# Patient Record
Sex: Female | Born: 1935
Health system: Southern US, Community
[De-identification: ages and names within clinical notes are randomized; demographics above are authoritative.]

## PROBLEM LIST (undated history)

## (undated) DIAGNOSIS — I739 Peripheral vascular disease, unspecified: Secondary | ICD-10-CM

## (undated) DIAGNOSIS — M1990 Unspecified osteoarthritis, unspecified site: Secondary | ICD-10-CM

## (undated) DIAGNOSIS — I4891 Unspecified atrial fibrillation: Secondary | ICD-10-CM

## (undated) DIAGNOSIS — I2510 Atherosclerotic heart disease of native coronary artery without angina pectoris: Secondary | ICD-10-CM

## (undated) DIAGNOSIS — R7302 Impaired glucose tolerance (oral): Secondary | ICD-10-CM

## (undated) DIAGNOSIS — Z87442 Personal history of urinary calculi: Secondary | ICD-10-CM

## (undated) DIAGNOSIS — I10 Essential (primary) hypertension: Secondary | ICD-10-CM

## (undated) DIAGNOSIS — I639 Cerebral infarction, unspecified: Secondary | ICD-10-CM

## (undated) DIAGNOSIS — I779 Disorder of arteries and arterioles, unspecified: Secondary | ICD-10-CM

## (undated) DIAGNOSIS — F32A Depression, unspecified: Secondary | ICD-10-CM

## (undated) DIAGNOSIS — E785 Hyperlipidemia, unspecified: Secondary | ICD-10-CM

## (undated) DIAGNOSIS — I495 Sick sinus syndrome: Secondary | ICD-10-CM

## (undated) DIAGNOSIS — J189 Pneumonia, unspecified organism: Secondary | ICD-10-CM

## (undated) DIAGNOSIS — F329 Major depressive disorder, single episode, unspecified: Secondary | ICD-10-CM

## (undated) DIAGNOSIS — Z7901 Long term (current) use of anticoagulants: Secondary | ICD-10-CM

## (undated) HISTORY — PX: COLONOSCOPY: SHX174

## (undated) HISTORY — DX: Pneumonia, unspecified organism: J18.9

## (undated) HISTORY — PX: APPENDECTOMY: SHX54

## (undated) HISTORY — DX: Sick sinus syndrome: I49.5

## (undated) HISTORY — DX: Long term (current) use of anticoagulants: Z79.01

## (undated) HISTORY — PX: BREAST BIOPSY: SHX20

## (undated) HISTORY — PX: CATARACT EXTRACTION W/ INTRAOCULAR LENS  IMPLANT, BILATERAL: SHX1307

## (undated) HISTORY — PX: ABDOMINAL HYSTERECTOMY: SHX81

## (undated) HISTORY — DX: Cerebral infarction, unspecified: I63.9

## (undated) HISTORY — DX: Hyperlipidemia, unspecified: E78.5

---

## 1997-01-27 HISTORY — PX: CAROTID ENDARTERECTOMY: SUR193

## 2001-04-13 ENCOUNTER — Emergency Department (HOSPITAL_COMMUNITY): Admission: EM | Admit: 2001-04-13 | Discharge: 2001-04-14 | Payer: Self-pay | Admitting: Emergency Medicine

## 2001-04-13 ENCOUNTER — Encounter: Payer: Self-pay | Admitting: Emergency Medicine

## 2002-01-12 ENCOUNTER — Encounter: Payer: Self-pay | Admitting: Family Medicine

## 2002-01-12 ENCOUNTER — Ambulatory Visit (HOSPITAL_COMMUNITY): Admission: RE | Admit: 2002-01-12 | Discharge: 2002-01-12 | Payer: Self-pay | Admitting: Family Medicine

## 2003-09-08 ENCOUNTER — Inpatient Hospital Stay (HOSPITAL_COMMUNITY): Admission: EM | Admit: 2003-09-08 | Discharge: 2003-09-09 | Payer: Self-pay | Admitting: *Deleted

## 2003-09-08 ENCOUNTER — Encounter: Payer: Self-pay | Admitting: Emergency Medicine

## 2003-10-14 ENCOUNTER — Emergency Department (HOSPITAL_COMMUNITY): Admission: EM | Admit: 2003-10-14 | Discharge: 2003-10-14 | Payer: Self-pay | Admitting: Emergency Medicine

## 2003-10-30 ENCOUNTER — Ambulatory Visit (HOSPITAL_COMMUNITY): Admission: RE | Admit: 2003-10-30 | Discharge: 2003-10-30 | Payer: Self-pay | Admitting: Family Medicine

## 2003-11-07 ENCOUNTER — Emergency Department (HOSPITAL_COMMUNITY): Admission: EM | Admit: 2003-11-07 | Discharge: 2003-11-08 | Payer: Self-pay | Admitting: *Deleted

## 2003-12-07 ENCOUNTER — Ambulatory Visit: Payer: Self-pay | Admitting: Family Medicine

## 2003-12-18 ENCOUNTER — Ambulatory Visit: Payer: Self-pay | Admitting: *Deleted

## 2004-01-09 ENCOUNTER — Ambulatory Visit: Payer: Self-pay | Admitting: Cardiology

## 2004-01-28 HISTORY — PX: INSERT / REPLACE / REMOVE PACEMAKER: SUR710

## 2004-02-05 ENCOUNTER — Emergency Department (HOSPITAL_COMMUNITY): Admission: EM | Admit: 2004-02-05 | Discharge: 2004-02-05 | Payer: Self-pay | Admitting: Emergency Medicine

## 2004-02-09 ENCOUNTER — Ambulatory Visit: Payer: Self-pay | Admitting: *Deleted

## 2004-02-16 ENCOUNTER — Ambulatory Visit: Payer: Self-pay | Admitting: Cardiology

## 2004-02-25 ENCOUNTER — Emergency Department (HOSPITAL_COMMUNITY): Admission: EM | Admit: 2004-02-25 | Discharge: 2004-02-25 | Payer: Self-pay | Admitting: Emergency Medicine

## 2004-03-01 ENCOUNTER — Ambulatory Visit: Payer: Self-pay | Admitting: Internal Medicine

## 2004-04-01 ENCOUNTER — Ambulatory Visit: Payer: Self-pay | Admitting: *Deleted

## 2004-04-01 ENCOUNTER — Ambulatory Visit: Payer: Self-pay | Admitting: Family Medicine

## 2004-04-30 ENCOUNTER — Ambulatory Visit: Payer: Self-pay | Admitting: *Deleted

## 2004-05-30 ENCOUNTER — Ambulatory Visit: Payer: Self-pay | Admitting: Cardiology

## 2004-06-06 ENCOUNTER — Ambulatory Visit (HOSPITAL_COMMUNITY): Admission: RE | Admit: 2004-06-06 | Discharge: 2004-06-06 | Payer: Self-pay | Admitting: Cardiology

## 2004-06-06 ENCOUNTER — Ambulatory Visit: Payer: Self-pay | Admitting: Cardiology

## 2004-06-07 ENCOUNTER — Ambulatory Visit: Payer: Self-pay | Admitting: Cardiology

## 2004-06-20 ENCOUNTER — Ambulatory Visit (HOSPITAL_COMMUNITY): Admission: RE | Admit: 2004-06-20 | Discharge: 2004-06-21 | Payer: Self-pay | Admitting: Internal Medicine

## 2004-06-21 ENCOUNTER — Ambulatory Visit: Payer: Self-pay | Admitting: Internal Medicine

## 2004-06-27 ENCOUNTER — Ambulatory Visit: Payer: Self-pay | Admitting: Cardiology

## 2004-07-18 ENCOUNTER — Ambulatory Visit: Payer: Self-pay | Admitting: Cardiology

## 2004-07-18 ENCOUNTER — Ambulatory Visit (HOSPITAL_COMMUNITY): Admission: RE | Admit: 2004-07-18 | Discharge: 2004-07-18 | Payer: Self-pay | Admitting: Cardiology

## 2004-09-18 ENCOUNTER — Ambulatory Visit (HOSPITAL_COMMUNITY): Admission: RE | Admit: 2004-09-18 | Discharge: 2004-09-18 | Payer: Self-pay | Admitting: Internal Medicine

## 2004-09-18 ENCOUNTER — Ambulatory Visit: Payer: Self-pay | Admitting: Internal Medicine

## 2004-11-21 ENCOUNTER — Ambulatory Visit: Payer: Self-pay | Admitting: Cardiology

## 2004-12-05 ENCOUNTER — Ambulatory Visit: Payer: Self-pay | Admitting: Cardiology

## 2004-12-23 ENCOUNTER — Ambulatory Visit (HOSPITAL_COMMUNITY): Admission: RE | Admit: 2004-12-23 | Discharge: 2004-12-23 | Payer: Self-pay | Admitting: Family Medicine

## 2005-05-26 ENCOUNTER — Ambulatory Visit: Payer: Self-pay | Admitting: Cardiology

## 2005-05-28 ENCOUNTER — Ambulatory Visit: Payer: Self-pay | Admitting: Internal Medicine

## 2005-05-30 ENCOUNTER — Ambulatory Visit: Payer: Self-pay | Admitting: Internal Medicine

## 2005-10-14 ENCOUNTER — Emergency Department (HOSPITAL_COMMUNITY): Admission: EM | Admit: 2005-10-14 | Discharge: 2005-10-14 | Payer: Self-pay | Admitting: Emergency Medicine

## 2005-12-04 ENCOUNTER — Ambulatory Visit: Payer: Self-pay | Admitting: *Deleted

## 2005-12-30 ENCOUNTER — Ambulatory Visit: Payer: Self-pay | Admitting: Internal Medicine

## 2006-03-04 ENCOUNTER — Ambulatory Visit: Payer: Self-pay | Admitting: Internal Medicine

## 2006-04-30 LAB — CONVERTED CEMR LAB
HDL: 41 mg/dL
LDL (calc): 100 mg/dL
Triglyceride fasting, serum: 176 mg/dL

## 2006-06-03 ENCOUNTER — Ambulatory Visit: Payer: Self-pay | Admitting: Internal Medicine

## 2006-06-12 ENCOUNTER — Ambulatory Visit: Payer: Self-pay | Admitting: Cardiology

## 2006-09-17 ENCOUNTER — Ambulatory Visit: Payer: Self-pay | Admitting: Cardiology

## 2006-12-10 ENCOUNTER — Ambulatory Visit: Payer: Self-pay | Admitting: Cardiology

## 2007-06-17 ENCOUNTER — Ambulatory Visit: Payer: Self-pay | Admitting: Cardiovascular Disease

## 2007-09-13 ENCOUNTER — Ambulatory Visit: Payer: Self-pay | Admitting: Internal Medicine

## 2007-09-14 ENCOUNTER — Ambulatory Visit: Payer: Self-pay | Admitting: Internal Medicine

## 2007-12-15 ENCOUNTER — Ambulatory Visit: Payer: Self-pay | Admitting: Internal Medicine

## 2008-01-28 DIAGNOSIS — J189 Pneumonia, unspecified organism: Secondary | ICD-10-CM

## 2008-01-28 HISTORY — DX: Pneumonia, unspecified organism: J18.9

## 2008-03-13 ENCOUNTER — Ambulatory Visit: Payer: Self-pay | Admitting: Cardiology

## 2008-03-13 ENCOUNTER — Inpatient Hospital Stay (HOSPITAL_COMMUNITY): Admission: EM | Admit: 2008-03-13 | Discharge: 2008-03-22 | Payer: Self-pay | Admitting: Emergency Medicine

## 2008-03-15 ENCOUNTER — Ambulatory Visit: Payer: Self-pay | Admitting: Cardiology

## 2008-03-15 ENCOUNTER — Encounter (INDEPENDENT_AMBULATORY_CARE_PROVIDER_SITE_OTHER): Payer: Self-pay | Admitting: Family Medicine

## 2008-03-29 ENCOUNTER — Ambulatory Visit: Payer: Self-pay | Admitting: Internal Medicine

## 2008-05-12 ENCOUNTER — Encounter (INDEPENDENT_AMBULATORY_CARE_PROVIDER_SITE_OTHER): Payer: Self-pay

## 2008-06-14 ENCOUNTER — Ambulatory Visit: Payer: Self-pay | Admitting: Internal Medicine

## 2008-08-16 ENCOUNTER — Ambulatory Visit (HOSPITAL_COMMUNITY): Admission: RE | Admit: 2008-08-16 | Discharge: 2008-08-16 | Payer: Self-pay | Admitting: Family Medicine

## 2008-08-31 LAB — PROTIME-INR

## 2008-09-11 ENCOUNTER — Ambulatory Visit (HOSPITAL_COMMUNITY): Admission: RE | Admit: 2008-09-11 | Discharge: 2008-09-11 | Payer: Self-pay | Admitting: Ophthalmology

## 2008-09-13 ENCOUNTER — Ambulatory Visit: Payer: Self-pay | Admitting: Internal Medicine

## 2008-09-13 ENCOUNTER — Encounter: Payer: Self-pay | Admitting: Internal Medicine

## 2008-09-18 ENCOUNTER — Encounter: Payer: Self-pay | Admitting: Internal Medicine

## 2008-10-16 ENCOUNTER — Ambulatory Visit (HOSPITAL_COMMUNITY): Admission: RE | Admit: 2008-10-16 | Discharge: 2008-10-16 | Payer: Self-pay | Admitting: Ophthalmology

## 2008-12-12 ENCOUNTER — Ambulatory Visit (HOSPITAL_COMMUNITY): Admission: RE | Admit: 2008-12-12 | Discharge: 2008-12-12 | Payer: Self-pay | Admitting: Family Medicine

## 2008-12-13 ENCOUNTER — Ambulatory Visit: Payer: Self-pay | Admitting: Internal Medicine

## 2008-12-14 ENCOUNTER — Telehealth (INDEPENDENT_AMBULATORY_CARE_PROVIDER_SITE_OTHER): Payer: Self-pay | Admitting: *Deleted

## 2008-12-16 ENCOUNTER — Encounter: Payer: Self-pay | Admitting: Internal Medicine

## 2008-12-27 ENCOUNTER — Ambulatory Visit (HOSPITAL_COMMUNITY): Admission: RE | Admit: 2008-12-27 | Discharge: 2008-12-27 | Payer: Self-pay | Admitting: Family Medicine

## 2009-01-03 ENCOUNTER — Encounter: Payer: Self-pay | Admitting: Internal Medicine

## 2009-01-30 ENCOUNTER — Encounter: Payer: Self-pay | Admitting: Family Medicine

## 2009-03-21 ENCOUNTER — Ambulatory Visit: Payer: Self-pay | Admitting: Internal Medicine

## 2009-03-28 ENCOUNTER — Ambulatory Visit: Payer: Self-pay | Admitting: Vascular Surgery

## 2009-04-10 ENCOUNTER — Encounter: Payer: Self-pay | Admitting: Internal Medicine

## 2009-06-13 ENCOUNTER — Ambulatory Visit: Payer: Self-pay | Admitting: Internal Medicine

## 2009-06-13 DIAGNOSIS — Z950 Presence of cardiac pacemaker: Secondary | ICD-10-CM | POA: Insufficient documentation

## 2009-08-10 ENCOUNTER — Telehealth (INDEPENDENT_AMBULATORY_CARE_PROVIDER_SITE_OTHER): Payer: Self-pay

## 2009-09-13 ENCOUNTER — Ambulatory Visit: Payer: Self-pay | Admitting: Internal Medicine

## 2009-10-10 ENCOUNTER — Encounter: Payer: Self-pay | Admitting: Internal Medicine

## 2009-11-12 ENCOUNTER — Ambulatory Visit: Payer: Self-pay | Admitting: Cardiology

## 2009-11-13 ENCOUNTER — Encounter: Payer: Self-pay | Admitting: Adult Health

## 2009-12-13 ENCOUNTER — Ambulatory Visit: Payer: Self-pay | Admitting: Internal Medicine

## 2009-12-24 ENCOUNTER — Ambulatory Visit: Payer: Self-pay | Admitting: Internal Medicine

## 2009-12-24 ENCOUNTER — Encounter (INDEPENDENT_AMBULATORY_CARE_PROVIDER_SITE_OTHER): Payer: Self-pay | Admitting: *Deleted

## 2009-12-26 ENCOUNTER — Encounter: Payer: Self-pay | Admitting: Internal Medicine

## 2009-12-26 ENCOUNTER — Telehealth (INDEPENDENT_AMBULATORY_CARE_PROVIDER_SITE_OTHER): Payer: Self-pay | Admitting: *Deleted

## 2010-01-03 ENCOUNTER — Encounter: Payer: Self-pay | Admitting: Internal Medicine

## 2010-01-03 ENCOUNTER — Encounter (INDEPENDENT_AMBULATORY_CARE_PROVIDER_SITE_OTHER): Payer: Self-pay | Admitting: *Deleted

## 2010-01-03 LAB — CONVERTED CEMR LAB
BUN: 13 mg/dL
BUN: 13 mg/dL (ref 6–23)
Basophils Relative: 1 % (ref 0–1)
CO2: 31 meq/L
Calcium: 9.6 mg/dL
Chloride: 105 meq/L
Creatinine, Ser: 1 mg/dL (ref 0.40–1.20)
Eosinophils Absolute: 0.1 10*3/uL
Eosinophils Absolute: 0.1 10*3/uL (ref 0.0–0.7)
Eosinophils Relative: 2 %
Glucose, Bld: 90 mg/dL
HCT: 41.6 % (ref 36.0–46.0)
INR: 1.79 — ABNORMAL HIGH (ref <1.50)
Lymphs Abs: 3.1 10*3/uL
Lymphs Abs: 3.1 10*3/uL (ref 0.7–4.0)
MCHC: 33.2 g/dL (ref 30.0–36.0)
Monocytes Relative: 9 %
Neutro Abs: 3.7 10*3/uL (ref 1.7–7.7)
Neutrophils Relative %: 48 % (ref 43–77)
Platelets: 233 10*3/uL (ref 150–400)
Potassium: 3.9 meq/L (ref 3.5–5.3)
Prothrombin Time: 21 s — ABNORMAL HIGH (ref 11.6–15.2)
RBC: 4.3 M/uL
RBC: 4.3 M/uL (ref 3.87–5.11)
WBC: 7.7 10*3/uL
aPTT: 31 s

## 2010-01-10 ENCOUNTER — Ambulatory Visit (HOSPITAL_COMMUNITY)
Admission: RE | Admit: 2010-01-10 | Discharge: 2010-01-10 | Payer: Self-pay | Source: Home / Self Care | Attending: Internal Medicine | Admitting: Internal Medicine

## 2010-01-11 ENCOUNTER — Encounter: Payer: Self-pay | Admitting: Internal Medicine

## 2010-01-29 ENCOUNTER — Telehealth (INDEPENDENT_AMBULATORY_CARE_PROVIDER_SITE_OTHER): Payer: Self-pay

## 2010-02-08 ENCOUNTER — Encounter: Payer: Self-pay | Admitting: Internal Medicine

## 2010-02-08 ENCOUNTER — Ambulatory Visit
Admission: RE | Admit: 2010-02-08 | Discharge: 2010-02-08 | Payer: Self-pay | Source: Home / Self Care | Attending: Cardiology | Admitting: Cardiology

## 2010-02-15 ENCOUNTER — Encounter: Payer: Self-pay | Admitting: Internal Medicine

## 2010-02-19 ENCOUNTER — Encounter (INDEPENDENT_AMBULATORY_CARE_PROVIDER_SITE_OTHER): Payer: Self-pay | Admitting: *Deleted

## 2010-02-26 ENCOUNTER — Ambulatory Visit
Admission: RE | Admit: 2010-02-26 | Discharge: 2010-02-26 | Payer: Self-pay | Source: Home / Self Care | Attending: Cardiology | Admitting: Cardiology

## 2010-02-26 DIAGNOSIS — R7309 Other abnormal glucose: Secondary | ICD-10-CM | POA: Insufficient documentation

## 2010-02-26 DIAGNOSIS — I495 Sick sinus syndrome: Secondary | ICD-10-CM | POA: Insufficient documentation

## 2010-02-26 DIAGNOSIS — E785 Hyperlipidemia, unspecified: Secondary | ICD-10-CM | POA: Insufficient documentation

## 2010-02-26 NOTE — Letter (Signed)
Summary: Remote Device Check  [employer], Main Office  [address]   [place], [place] [address]   Phone: [phone]  Fax: [phone]     October 10, 2009 MRN: [id number]   AKYLAH HASCALL [address] [place], [place]  [address]   Dear Ms. Maynes,   Your remote transmission was recieved and reviewed by your physician.  All diagnostics were within normal limits for you.  __X___Your next transmission is scheduled for:  12-13-09.  Please transmit at any time this day.  If you have a wireless device your transmission will be sent automatically.    Sincerely,  Vella Kohler

## 2010-02-26 NOTE — Letter (Signed)
Summary: Implantable Device Instructions  [facility]  [address] [place] [address]   Phone: [phone]  Fax: [phone]      Implantable Device Instructions  You are scheduled for:  _____ Permanent Transvenous Pacemaker _____ Implantable Cardioverter Defibrillator _____ Implantable Loop Recorder __x__ Generator Change  on 01/10/2010 with Dr.Taylor.  1.  Please arrive at the Short Stay Center at [facility] at 5:30am on the day of your procedure.  2.  Do not eat or drink the night before your procedure.  3.  Complete lab work on 01/04/2010   Take your last dose of Coumadin on 01/07/2010.  5.  Plan for an overnight stay.  Bring your insurance cards and a list of your medications.  6.  Wash your chest and neck with antibacterial soap (any brand) the evening before and the morning of your procedure.  Rinse well.  7.  Education material received:     Pacemaker _____           ICD _____           Arrhythmia _____  *If you have ANY questions after you get home, please call the office [phone].  *Every attempt is made to prevent procedures from being rescheduled.  Due to the nauture of Electrophysiology, rescheduling can happen.  The physician is always aware and directs the staff when this occurs.

## 2010-02-26 NOTE — Assessment & Plan Note (Addendum)
Summary: ERI-requires generator replacement   Visit Type:  Follow-up Primary Provider:  [facility]   CC:  no cardiology complaints.  History of Present Illness: Traci [name] is a 76 y/o CF we are following for PAF, hypertension and symptomatic bradycardia who is s/p pacemaker.   She denies chest pain, or palpatations but has noted increased fatigue, and sob. She says she cannot tell when she is in atrial fib.  She feels weak as well. No peripheral edema.  Current Medications (verified): 1)  Coumadin 5 Mg Tabs (Warfarin Sodium) .... Take As Directed 2)  Verapamil Hcl 120 Mg Tabs (Verapamil Hcl) .... Take 1 Tablet By Mouth Three Times A Day 3)  Propranolol Hcl 10 Mg Tabs (Propranolol Hcl) .... Take 1 Tablet By Mouth Daily 4)  Alprazolam 1 Mg Tabs (Alprazolam) .... Take 1/4   Tab By Mouth At Bedtime 5)  Vitamin C 1000 Mg Tabs (Ascorbic Acid) .... Take 1 Tab Three Times A Day 6)  Vitamin E 400 Unit Caps (Vitamin E) .... Take 1 Tab Two Times A Day 7)  Lithium Carbonate 1200 .... Take 1 Tab Daily 8)  Calcium 1200 .... Take 1 Tab Three Times A Day 9)  Losartan Potassium 50 Mg Tabs (Losartan Potassium) .... Take 1 Tablet By Mouth Once A Day 10)  Crestor 5 Mg Tabs (Rosuvastatin Calcium) .... Take 1 Tab Daily 11)  Niacin 250 Mg Tabs (Niacin) .... Take 1 Tab Daily 12)  Fish Oil 1000 Mg Caps (Omega-3 Fatty Acids) .... Take 1 Tab Daily 13)  Folic Acid 400 Mcg Tabs (Folic Acid) .... Take 1 Tab Daily 14)  Potassium 99 Mg Tabs (Potassium) .... Take 1 Tab Daily  Allergies (verified): 1)  ! * Morphine  Past History:  Past Medical History: Last updated: 06/13/2008 Atrial Fibrillation Hyperlipidemia Hypertension DM II CAROTID STENOSIS CHRONIC ANTICOAGULATION  Review of Systems       The patient complains of dyspnea on exertion.  The patient denies chest pain, syncope, and peripheral edema.    Vital Signs:  Patient profile:   76 year old female Weight:      160 pounds BMI:     26.72 Pulse  rate:   62 / minute BP sitting:   155 / 89  (right arm)  Vitals Entered By: Dreama Saa, CNA (December 24, 2009 4:22 PM)  Physical Exam  General:  Well developed, well nourished, in no acute distress. Head:  normocephalic and atraumatic Eyes:  PERRLA/EOM intact; conjunctiva and lids normal. Neck:  Neck supple, no JVD. No masses, thyromegaly or abnormal cervical nodes. Lungs:  Clear bilaterally to auscultation with no wheezes, rales, or rhonchi.  Heart:  Irregular without MRG Abdomen:  Bowel sounds positive; abdomen soft and non-tender without masses, organomegaly, or hernias noted. No hepatosplenomegaly. Msk:  Back normal, normal gait. Muscle strength and tone normal. Pulses:  pulses normal in all 4 extremities Extremities:  No clubbing or cyanosis. Neurologic:  Alert and oriented x 3.   PPM Specifications Following MD:  Lewayne Bunting, MD     PPM Vendor:  [employer]     PPM Model Number:  [id number]     PPM Serial Number:  [id number] PPM DOI:  06/20/2004     PPM Implanting MD:  Lewayne Bunting, MD  Lead 1    Location: RA     DOI: 06/20/2004     Model #: 4034     Serial #: [id number]     Status: active Lead 2    Location:  RV     DOI: 06/20/2004     Model #: 2536     Serial #: [id number]     Status: active   Indications:  SSS   PPM Follow Up Pacer Dependent:  Yes      Episodes Coumadin:  Yes  Parameters Mode:  DDDR     Lower Rate Limit:  50     Upper Rate Limit:  130 Paced AV Delay:  300     Sensed AV Delay:  300 Tech Comments:  Device @ ERI.  A-fib, + coumadin. [name], LPN  December 24, 2009 4:41 PM  MD Comments:  Agree with above.   Impression & Recommendations:  Problem # 1:  CARDIAC PACEMAKER IN SITU (ICD-V45.01) Her device has reached ERI.  Will schedule generator change.  Problem # 2:  ATRIAL FIBRILLATION (ICD-427.31) Her rates are controlled but no optimally.  Will switch form inderal to carvedilol.  she may require digoxin. The following medications were  removed from the medication list:    Inderal La 80 Mg Xr24h-cap (Propranolol hcl) .Marland Kitchen... 1 tablet by mouth once daily Her updated medication list for this problem includes:    Coumadin 5 Mg Tabs (Warfarin sodium) .Marland Kitchen... Take as directed    Propranolol Hcl 10 Mg Tabs (Propranolol hcl) .Marland Kitchen... Take 1 tablet by mouth daily  Problem # 3:  CHRONIC DIASTOLIC HEART FAILURE (ICD-428.32) Her symptoms appear to be worse.  I have asked her to reduce her sodium intake and start low dose lasix.  The following medications were removed from the medication list:    Inderal La 80 Mg Xr24h-cap (Propranolol hcl) .Marland Kitchen... 1 tablet by mouth once daily Her updated medication list for this problem includes:    Coumadin 5 Mg Tabs (Warfarin sodium) .Marland Kitchen... Take as directed    Verapamil Hcl 120 Mg Tabs (Verapamil hcl) .Marland Kitchen... Take 1 tablet by mouth three times a day    Propranolol Hcl 10 Mg Tabs (Propranolol hcl) .Marland Kitchen... Take 1 tablet by mouth daily    Losartan Potassium 50 Mg Tabs (Losartan potassium) .Marland Kitchen... Take 1 tablet by mouth once a day

## 2010-02-26 NOTE — Progress Notes (Signed)
Summary: RX REFILL  Phone Note Call from Patient Call back at Home Phone [phone]   Caller: PT Reason for Call: Refill Medication Summary of Call: PT WOULD LIKE TO HAVE HER RX LOSARTIN FAXED TO [phone] [facility] SO THEY WILL HAVE A COPY OF IT WHEN  ITS TIME TO GET FILLED.  PT CALLING BACK TO GIVE THE SAME INFO SHE SAID SHE HAS JOINED [employer] FOR RX NOW SO GO AHEAD AND CALL THEM IN FOR HER.  I CALLED PT BACK AND LET HER KNOW SHE NEEDS TO MAKE APPT WITH DR. Dietrich Pates FIRST. PT STATES THAT THIS RX WAS GIVEN BY DR Ladona Ridgel.  SHE DOWSNT REMEMBER THE NAME OF IT SHE SAYS IT HAS SARTAN IN THE NAME. SHE CAN NOT MAKE APPT AT THIS TIME WITH [facility] AND WILL CALL BACK TO DO SO.   Initial call taken by: Faythe Ghee,  August 10, 2009 3:09 PM  Follow-up for Phone Call        [facility]. Pt. is 3 years overdue for f/u with Dr. Dietrich Pates Follow-up by: Larita Fife Via LPN,  August 10, 2009 4:18 PM    New/Updated Medications: LOSARTAN POTASSIUM 50 MG TABS (LOSARTAN POTASSIUM) Take 1 tablet by mouth once a day Prescriptions: LOSARTAN POTASSIUM 50 MG TABS (LOSARTAN POTASSIUM) Take 1 tablet by mouth once a day  #90 x 0   Entered by:   Larita Fife Via LPN   Authorized by:   Laren Boom, MD, [facility]   Signed by:   Larita Fife Via LPN on 29/56/2130   Method used:   Faxed to ...       [facility] (mail-order)             , [place]         Ph: [phone]       Fax: [phone]   RxID:   [id number]

## 2010-02-26 NOTE — Cardiovascular Report (Signed)
Summary: Office Visit Remote   Office Visit Remote   Imported By: Roderic Ovens 10/16/2009 14:03:41  _____________________________________________________________________  External Attachment:    Type:   Image     Comment:   External Document

## 2010-02-26 NOTE — Progress Notes (Signed)
Summary: NEEDS NEW RX [facility]XED TO [facility]  Phone Note Call from Patient Call back at [phone]   Caller: PT Reason for Call: Refill Medication Summary of Call: PT WAS JUST PUT ON NEW MEDS THIS WEEK AND NEED THEM CALLED IN TO [facility] FAX [phone]. SHE DOESNT KNOW THE NAME OF THE MEDICATIONS. Initial call taken by: Faythe Ghee,  December 26, 2009 9:33 AM    Prescriptions: FUROSEMIDE 20 MG TABS (FUROSEMIDE) Take one tablet by mouth daily.  #90 x 3   Entered by:   Teressa Lower RN   Authorized by:   Laren Boom, MD, [facility]   Signed by:   Teressa Lower RN on 12/26/2009   Method used:   Electronically to        [facility] (mail-order)       [address]       [place], [place]  [address]       Ph: [phone]       Fax: [phone]   RxID:   [id number] POTASSIUM CHLORIDE CR 10 MEQ CR-CAPS (POTASSIUM CHLORIDE) Take 1 tablet by mouth once a day  #90 x 3   Entered by:   Teressa Lower RN   Authorized by:   Laren Boom, MD, [facility]   Signed by:   Teressa Lower RN on 12/26/2009   Method used:   Electronically to        [facility] (mail-order)       [address]       [place], [place]  [address]       Ph: [phone]       Fax: [phone]   RxID:   [id number] CARVEDILOL 6.25 MG TABS (CARVEDILOL) Take one tablet by mouth twice a day  #180 x 3   Entered by:   Teressa Lower RN   Authorized by:   Laren Boom, MD, [facility]   Signed by:   Teressa Lower RN on 12/26/2009   Method used:   Electronically to        [facility] (mail-order)       [address]       [place], [place]  [address]       Ph: [phone]       Fax: [phone]   RxID:   [id number] POTASSIUM CHLORIDE CR 10 MEQ CR-CAPS (POTASSIUM CHLORIDE) Take 1 tablet by mouth once a day  #90 x 3   Entered by:   Teressa Lower RN   Authorized by:   Laren Boom, MD, [facility]   Signed by:   Teressa Lower RN on  12/26/2009   Method used:   Faxed to ...       [facility] ([facility]order)             , [place]         Ph:        Fax: [phone]   RxID:   [id number] FUROSEMIDE 20 MG TABS (FUROSEMIDE) Take one tablet by mouth daily.  #90 x 3   Entered by:   Teressa Lower RN   Authorized by:   Laren Boom, MD, [facility]   Signed by:   Teressa Lower RN on 12/26/2009   Method used:   Faxed to ...       [facility] (mail-order)             , [place]  Ph:        Fax: [phone]   RxID:   [id number] CARVEDILOL 6.25 MG TABS (CARVEDILOL) Take one tablet by mouth twice a day  #180 x 3   Entered by:   Teressa Lower RN   Authorized by:   Laren Boom, MD, [facility]   Signed by:   Teressa Lower RN on 12/26/2009   Method used:   Faxed to ...       [facility] (mail[facility]order)             , [place]         Ph:        Fax: [phone]   RxID:   [id number]

## 2010-02-26 NOTE — Cardiovascular Report (Signed)
Summary: Office Visit   Office Visit   Imported By: Roderic Ovens 06/29/2009 10:35:30  _____________________________________________________________________  External Attachment:    Type:   Image     Comment:   External Document

## 2010-02-26 NOTE — Cardiovascular Report (Signed)
Summary: Office Visit Remote   Office Visit Remote   Imported By: Roderic Ovens 01/04/2010 15:08:09  _____________________________________________________________________  External Attachment:    Type:   Image     Comment:   External Document

## 2010-02-26 NOTE — Assessment & Plan Note (Signed)
Summary: 1 yr f/u per checkout on 06/14/08/tg   Visit Type:  Follow-up Primary Provider:  DR.MARK CRESENZO  CC:  NO CARDIOLOGY COMPLAINTS TODAY.  History of Present Illness: Traci [name] returns today for PM followup.  She is a pleasant 76 yo woman with a h/o HTN, PAF and symptomatic bradycardia who underwent PPM in 2006.  She initially was bothered by palpitations but her symptoms have improved.  She denies c/p, sob or peripheral edema. She has been on Benicar but would like to change meds.  Her sister has had good luck with losartan.  She has had no syncope.  Current Medications (verified): 1)  Coumadin 5 Mg Tabs (Warfarin Sodium) 2)  Verapamil Hcl 120 Mg Tabs (Verapamil Hcl) .... Take 1 Tablet By Mouth Three Times A Day 3)  Benicar 40 Mg Tabs (Olmesartan Medoxomil) .... Take One Tablet By Mouth Daily 4)  Propranolol Hcl 10 Mg Tabs (Propranolol Hcl) .... Take 1 Tablet By Mouth Daily 5)  Alprazolam 1 Mg Tabs (Alprazolam) .... Take 1/2  Tab By Mouth At Bedtime 6)  Vitamin C Cr 500 Mg Cr-Tabs (Ascorbic Acid) .... Take 1 Tab Daily 7)  Vitamin E 100 Unit Caps (Vitamin E) .... Take 1 Tab Daily 8)  Lithium Carbonate 600 Mg Caps (Lithium Carbonate) .... Take 1 Tab Daily 9)  Calcium 500 Mg Tabs (Calcium) .... Take 1 Tab Daily 10)  Hair/skin/nails  Tabs (Multiple Vitamins-Minerals) .... Take 1 Tab Daily 11)  Losartan Potassium 50 Mg Tabs (Losartan Potassium) .... Take 1 Tablet By Mouth Once A Day  Allergies (verified): No Known Drug Allergies  Past History:  Past Medical History: Last updated: 06/13/2008 Atrial Fibrillation Hyperlipidemia Hypertension DM II CAROTID STENOSIS CHRONIC ANTICOAGULATION  Review of Systems  The patient denies chest pain, syncope, dyspnea on exertion, and peripheral edema.    Vital Signs:  Patient profile:   76 year old female Height:      65 inches Weight:      150 pounds BMI:     25.05 Pulse rate:   72 / minute BP sitting:   166 / 81  (right  arm)  Vitals Entered By: Traci Saa, CNA (Jun 13, 2009 4:38 PM)  Physical Exam  General:  Well developed, well nourished, in no acute distress.  HEENT: normal Neck: supple. No JVD. Carotids 2+ bilaterally no bruits Cor: RRR no rubs, gallops or murmur except for a soft S4. Lungs: CTA. Well healed PPM incision. Ab: soft, nontender. nondistended. No HSM. Good bowel sounds Ext: warm. no cyanosis, clubbing or edema Neuro: alert and oriented. Grossly nonfocal. affect pleasant    PPM Specifications Following MD:  Traci Bunting, MD     PPM Vendor:  [employer]     PPM Model Number:  [id number]     PPM Serial Number:  [id number] PPM DOI:  06/20/2004     PPM Implanting MD:  Traci Bunting, MD  Lead 1    Location: RA     DOI: 06/20/2004     Model #: [id number]     Serial #: [id number]     Status: active Lead 2    Location: [facility]     DOI: 06/20/2004     Model #: 3875     Serial #: [id number]     Status: active   Indications:  SSS   PPM Follow Up Remote Check?  No Battery Voltage:  2.95 V     Pacer Dependent:  Yes       PPM Device  Measurements [facility]  Amplitude: 2.3 mV, Impedance: 608 ohms, Threshold: 0.5 V at 0.4 msec Right Ventricle  Amplitude: 5.1 mV, Impedance: 456 ohms, Threshold: 1.0 V at 0.4 msec  Episodes MS Episodes:  263     Percent Mode Switch:  73.1%     Coumadin:  Yes Atrial Pacing:  22.5%     Ventricular Pacing:  4.7%  Parameters Mode:  DDDR     [name] Rate Limit:  50     Upper Rate Limit:  130 Paced AV Delay:  300     Sensed AV Delay:  300 Next Remote Date:  09/13/2009     Next Cardiology Appt Due:  05/28/2010 Tech Comments:  Reprogrammed as above.   [facility] transmissions every 3 months. ROV 1 year with Dr. Ladona Ridgel. Traci Harm, LPN  Jun 13, 2009 5:10 PM  MD Comments:  Agree with above. We reprogrammed her to minimize RV pacing.  Impression & Recommendations:  Problem # 1:  ATRIAL FIBRILLATION (ICD-427.31) Her ventricular rate has been well controlled.   Continue current  meds. Her updated medication list for this problem includes:    Coumadin 5 Mg Tabs (Warfarin sodium)    Propranolol Hcl 10 Mg Tabs (Propranolol hcl) .Marland Kitchen... Take 1 tablet by mouth daily  Problem # 2:  CARDIAC PACEMAKER IN SITU (ICD-V45.01) Her device is working normally.  Will recheck in several months.  Problem # 3:  ESSENTIAL HYPERTENSION, BENIGN (ICD-401.1) Her blood pressure remains elevated.  I have asked her to try Losartan 50 mg/day in place of Benicar as she wishes a generic drug. Her updated medication list for this problem includes:    Verapamil Hcl 120 Mg Tabs (Verapamil hcl) .Marland Kitchen... Take 1 tablet by mouth three times a day    Benicar 40 Mg Tabs (Olmesartan medoxomil) .Marland Kitchen... Take one tablet by mouth daily    Propranolol Hcl 10 Mg Tabs (Propranolol hcl) .Marland Kitchen... Take 1 tablet by mouth daily    Losartan Potassium 50 Mg Tabs (Losartan potassium) .Marland Kitchen... Take 1 tablet by mouth once a day  Patient Instructions: 1)  Your physician recommends that you schedule a follow-up appointment in: 1 year pacer follow up 2)  Your physician has recommended you make the following change in your medication:  losartan 50mg  daily Prescriptions: LOSARTAN POTASSIUM 50 MG TABS (LOSARTAN POTASSIUM) Take 1 tablet by mouth once a day  #30 x 6   Entered by:   Traci Lower RN   Authorized by:   Laren Boom, MD, [facility]   Signed by:   Traci Lower RN on 06/13/2009   Method used:   Electronically to        [facility]  [address] (retail)       [address]       [place], [place]  [address]       Ph: [phone]       Fax: [phone]   RxID:   [id number]

## 2010-02-26 NOTE — Letter (Signed)
Summary: Remote Device Check  [facility], Main Office  [address]   [place], [place] [address]   Phone: [phone]  Fax: [phone]     April 10, 2009 MRN: [id number]   Traci [name] [address] [place], [place]  [address]   Dear [name],   Your remote transmission was recieved and reviewed by your physician.  All diagnostics were within normal limits for you.    ___X___Your next office visit is scheduled for:  MAY 2011 WITH DR Ladona Ridgel. Please call our [place] office [phone] to schedule an appointment.    Sincerely,  Proofreader

## 2010-02-26 NOTE — Letter (Signed)
Summary: Historic Patient File  Historic Patient File   Imported By: Lind Guest 01/30/2009 08:20:07  _____________________________________________________________________  External Attachment:    Type:   Image     Comment:   External Document

## 2010-02-26 NOTE — Miscellaneous (Signed)
Summary: Orders Update  Clinical Lists Changes  Problems: Added new problem of UNSPECIFIED PRE-OPERATIVE EXAMINATION (ICD-V72.84) - Signed Added new problem of COUMADIN THERAPY (ICD-V58.61) - Signed Medications: Changed medication from PROPRANOLOL HCL 10 MG TABS (PROPRANOLOL HCL) Take 1 tablet by mouth daILY to CARVEDILOL 6.25 MG TABS (CARVEDILOL) Take one tablet by mouth twice a day - Signed Added new medication of FUROSEMIDE 20 MG TABS (FUROSEMIDE) Take one tablet by mouth daily. - Signed Added new medication of POTASSIUM CHLORIDE CR 10 MEQ CR-CAPS (POTASSIUM CHLORIDE) Take 1 tablet by mouth once a day - Signed Rx of CARVEDILOL 6.25 MG TABS (CARVEDILOL) Take one tablet by mouth twice a day;  #60 x 3;  Signed;  Entered by: Teressa Lower RN;  Authorized by: Laren Boom, MD, [facility];  Method used: Electronically to [facility] [address] [place], [place], [place]  [address], Ph: [phone], Fax: [phone] Rx of FUROSEMIDE 20 MG TABS (FUROSEMIDE) Take one tablet by mouth daily.;  #30 x 3;  Signed;  Entered by: Teressa Lower RN;  Authorized by: Laren Boom, MD, [facility];  Method used: Electronically to [facility] [address], [place], [place], [place]  [address], Ph: [phone], Fax: [phone] Rx of POTASSIUM CHLORIDE CR 10 MEQ CR-CAPS (POTASSIUM CHLORIDE) Take 1 tablet by mouth once a day;  #30 x 3;  Signed;  Entered by: Teressa Lower RN;  Authorized by: Laren Boom, MD, [facility];  Method used: Electronically to [facility] [address] [place], [place], [place]  [address], Ph: [phone], Fax: [phone] Orders: Added new Test order of T-Basic Metabolic Panel [phone]) - Signed Added new Test order of T-CBC w/Diff [phone]) - Signed Added new Test order of T-PTT ([phone]) - Signed Added new Test order of T-Protime, Auto ([phone]) - Signed    Prescriptions: POTASSIUM CHLORIDE CR 10 MEQ CR-CAPS (POTASSIUM  CHLORIDE) Take 1 tablet by mouth once a day  #30 x 3   Entered by:   Teressa Lower RN   Authorized by:   Laren Boom, MD, [facility]   Signed by:   Teressa Lower RN on 12/24/2009   Method used:   Electronically to        [facility]  [address] (retail)       [address]       [place], [place]  [address]       Ph: [phone]       Fax: [phone]   RxID:   [id number] FUROSEMIDE 20 MG TABS (FUROSEMIDE) Take one tablet by mouth daily.  #30 x 3   Entered by:   Teressa Lower RN   Authorized by:   Laren Boom, MD, [facility]   Signed by:   Teressa Lower RN on 12/24/2009   Method used:   Electronically to        [facility]  [address] (retail)       [address]       [place], [place]  [address]       Ph: [phone]       Fax: [phone]   RxID:   [id number] CARVEDILOL 6.25 MG TABS (CARVEDILOL) Take one tablet by mouth twice a day  #60 x 3   Entered by:   Teressa Lower RN   Authorized by:   Laren Boom, MD, [facility]  Signed by:   Teressa Lower RN on 12/24/2009   Method used:   Electronically to        [facility]  [address] (retail)       [address]       [place], [place]  [address]       Ph: [phone]       Fax: [phone]   RxID:   [id number]   Appended Document: Orders Update    Clinical Lists Changes  Medications: Removed medication of POTASSIUM 99 MG TABS (POTASSIUM) take 1 tab daily Rx of CARVEDILOL 6.25 MG TABS (CARVEDILOL) Take one tablet by mouth twice a day;  #60 x 3;  Signed;  Entered by: Teressa Lower RN;  Authorized by: Laren Boom, MD, [facility];  Method used: Electronically to [facility] [address] [phone]*, [address], [place], [place]  [address], Ph: [phone], Fax: [phone] Rx of FUROSEMIDE 20 MG TABS (FUROSEMIDE) Take one tablet by mouth daily.;  #30 x 3;  Signed;  Entered by: Teressa Lower RN;  Authorized by: Laren Boom, MD, [facility];  Method used:  Electronically to [facility] [address] [phone]*, [address], [place], [place]  [address], Ph: [phone], Fax: [phone] Rx of POTASSIUM CHLORIDE CR 10 MEQ CR-CAPS (POTASSIUM CHLORIDE) Take 1 tablet by mouth once a day;  #30 x 3;  Signed;  Entered by: Teressa Lower RN;  Authorized by: Laren Boom, MD, [facility];  Method used: Electronically to [facility] [address] [phone]*, [address], [place], [place]  [address], Ph: [phone], Fax: [phone]    Prescriptions: POTASSIUM CHLORIDE CR 10 MEQ CR-CAPS (POTASSIUM CHLORIDE) Take 1 tablet by mouth once a day  #30 x 3   Entered by:   Teressa Lower RN   Authorized by:   Laren Boom, MD, [facility]   Signed by:   Teressa Lower RN on 12/24/2009   Method used:   Electronically to        [facility]. [phone]* (retail)       [address] [place], [place]  [address]       Ph: [phone]       Fax: [phone]   RxID:   [id number] FUROSEMIDE 20 MG TABS (FUROSEMIDE) Take one tablet by mouth daily.  #30 x 3   Entered by:   Teressa Lower RN   Authorized by:   Laren Boom, MD, [facility]   Signed by:   Teressa Lower RN on 12/24/2009   Method used:   Electronically to        [facility]. [phone]* (retail)       [address] [place], [place]  [address]       Ph: [phone]       Fax: [phone]   RxID:   [id number] CARVEDILOL 6.25 MG TABS (CARVEDILOL) Take one tablet by mouth twice a day  #60 x 3   Entered by:   Teressa Lower RN   Authorized by:   Laren Boom, MD, [facility]   Signed by:   Teressa Lower RN on 12/24/2009   Method used:   Electronically to        [facility]. [phone]* (retail)       [address]       [place], [place]  [address]  Ph: [phone]       Fax: [phone]   RxID:   [id number]

## 2010-02-26 NOTE — Assessment & Plan Note (Signed)
Summary: PAST DUE FOR 1 YR F/U/TG   Primary Provider:  DR.MARK CRESENZO   History of Present Illness: Traci [name] is a 76 y/o CF we are following for PAF, hypertension and symptomatic bradycardia who is s/p pacemaker.  She is followed also by Dr. Ladona Ridgel and was last seen by him in May of 2011.  At that time, benicar was changed to losartan. She is doing well but states she is more tired lately. She says that her husband has been diagnosed with lung cancer and she is taking him back and forth to [facility] for radiation treatments daily.  She denies shortness of breath, chest pain, or palpatations.  She says she cannot tell when she is in atrial fib.  She said in the past that she could, but no longer feels this.  Preventive Screening-Counseling & Management  Alcohol-Tobacco     Smoking Status: never  Current Medications (verified): 1)  Coumadin 5 Mg Tabs (Warfarin Sodium) 2)  Verapamil Hcl 120 Mg Tabs (Verapamil Hcl) .... Take 1 Tablet By Mouth Three Times A Day 3)  Propranolol Hcl 10 Mg Tabs (Propranolol Hcl) .... Take 1 Tablet By Mouth Daily 4)  Alprazolam 1 Mg Tabs (Alprazolam) .... Take 1/2  Tab By Mouth At Bedtime 5)  Vitamin C Cr 500 Mg Cr-Tabs (Ascorbic Acid) .... Take 1 Tab Daily 6)  Vitamin E 100 Unit Caps (Vitamin E) .... Take 1 Tab Daily 7)  Lithium Carbonate 600 Mg Caps (Lithium Carbonate) .... Take 1 Tab Daily 8)  Calcium 500 Mg Tabs (Calcium) .... Take 1 Tab Daily 9)  Hair/skin/nails  Tabs (Multiple Vitamins-Minerals) .... Take 1 Tab Daily 10)  Losartan Potassium 50 Mg Tabs (Losartan Potassium) .... Take 1 Tablet By Mouth Once A Day 11)  Inderal La 80 Mg Xr24h-Cap (Propranolol Hcl) .Marland Kitchen.. 1 Tablet By Mouth Once Daily  Allergies (verified): 1)  ! * Morphine  Past History:  Past medical, surgical, family and social histories (including risk factors) reviewed, and no changes noted (except as noted below).  Past Medical History: Reviewed history from 06/13/2008 and no  changes required. Atrial Fibrillation Hyperlipidemia Hypertension DM II CAROTID STENOSIS CHRONIC ANTICOAGULATION  Family History: Reviewed history and no changes required.  Social History: Reviewed history and no changes required. Smoking Status:  never  Review of Systems       Fatigue  Vital Signs:  Patient profile:   76 year old female Weight:      155 pounds O2 Sat:      96 % on Room air Pulse rate:   122 / minute BP sitting:   138 / 95  (right arm)  Vitals Entered ByLarita Fife Via LPN (November 12, 2009 1:17 PM)  O2 Flow:  Room air   Physical Exam  General:  Well developed, well nourished, in no acute distress. Head:  normocephalic and atraumatic Eyes:  PERRLA/EOM intact; conjunctiva and lids normal. Lungs:  Clear bilaterally to auscultation and percussion. Heart:  Irregular without MRG Abdomen:  Bowel sounds positive; abdomen soft and non-tender without masses, organomegaly, or hernias noted. No hepatosplenomegaly. Msk:  Back normal, normal gait. Muscle strength and tone normal. Pulses:  pulses normal in all 4 extremities Extremities:  No clubbing or cyanosis. Neurologic:  Alert and oriented x 3. Psych:  Normal affect.   EKG  Procedure date:  11/12/2009  Findings:      Atrial fibrillation with a controlled ventricular response rate of: 103  EKG  Procedure date:  11/12/2009  Findings:  Non-specific ST-T wave changes noted.    PPM Specifications Following MD:  Lewayne Bunting, MD     PPM Vendor:  [employer]onic     PPM Model Number:  [id number]     PPM Serial Number:  [id number] PPM DOI:  06/20/2004     PPM Implanting MD:  Lewayne Bunting, MD  Lead 1    Location: RA     DOI: 06/20/2004     Model #: [id number]     Serial #: [id number]     Status: active Lead 2    Location: RV     DOI: 06/20/2004     Model #: [id number]     Serial #: [id number]     Status: active   Indications:  SSS   PPM Follow Up Pacer Dependent:  Yes      Episodes Coumadin:   Yes  Parameters Mode:  DDDR     Lower Rate Limit:  50     Upper Rate Limit:  130 Paced AV Delay:  300     Sensed AV Delay:  300  Impression & Recommendations:  Problem # 1:  ATRIAL FIBRILLATION (ICD-427.31) Her HR is essentially well controlled on current medications.  She states that she cannot feel it any longer when she is out of rhythm.  Her EKG today demonstrates afib.  She is under some stress with her husband's illness.  Will not change her medications at this time.  She will continue in the coumadin clinic and see Dr. Ladona Ridgel on follow-up previously scheduled. Her updated medication list for this problem includes:    Coumadin 5 Mg Tabs (Warfarin sodium)    Propranolol Hcl 10 Mg Tabs (Propranolol hcl) .Marland Kitchen... Take 1 tablet by mouth daily    Inderal La 80 Mg Xr24h-cap (Propranolol hcl) .Marland Kitchen... 1 tablet by mouth once daily  Problem # 2:  ESSENTIAL HYPERTENSION, BENIGN (ICD-401.1) Losartan dose along with verapamil is controlling her BP nicely.  No changes at this time. The following medications were removed from the medication list:    Benicar 40 Mg Tabs (Olmesartan medoxomil) .Marland Kitchen... Take one tablet by mouth daily Her updated medication list for this problem includes:    Verapamil Hcl 120 Mg Tabs (Verapamil hcl) .Marland Kitchen... Take 1 tablet by mouth three times a day    Propranolol Hcl 10 Mg Tabs (Propranolol hcl) .Marland Kitchen... Take 1 tablet by mouth daily    Losartan Potassium 50 Mg Tabs (Losartan potassium) .Marland Kitchen... Take 1 tablet by mouth once a day    Inderal La 80 Mg Xr24h-cap (Propranolol hcl) .Marland Kitchen... 1 tablet by mouth once daily  Patient Instructions: 1)  Your physician recommends that you schedule a follow-up appointment in: 6 months Prescriptions: LOSARTAN POTASSIUM 50 MG TABS (LOSARTAN POTASSIUM) Take 1 tablet by mouth once a day  #30 x 3   Entered by:   Teressa Lower RN   Authorized by:   Joni Reining, NP   Signed by:   Teressa Lower RN on 11/12/2009   Method used:   Electronically to         [facility]. [phone]* (retail)       [address], [place]  [address]       Ph: [phone]       Fax: [phone]   RxID:   [id number]   Prevention & Chronic Care Immunizations   Influenza vaccine: Not documented    Tetanus booster: Not documented    Pneumococcal vaccine:  Not documented    H. zoster vaccine: Not documented  Colorectal Screening   Hemoccult: Not documented    Colonoscopy: Not documented  Other Screening   Pap smear: Not documented    Mammogram: Not documented    DXA bone density scan: Not documented   Smoking status: never  (11/12/2009)  Lipids   Total Cholesterol: Not documented   LDL: Not documented   LDL Direct: Not documented   HDL: Not documented   Triglycerides: Not documented  Hypertension   Last Blood Pressure: 138 / 95  (11/12/2009)   Serum creatinine: Not documented   Serum potassium Not documented  Self-Management Support :    Hypertension self-management support: Not documented

## 2010-02-26 NOTE — Cardiovascular Report (Signed)
Summary: Office Visit Remote   Office Visit Remote   Imported By: Roderic Ovens 04/11/2009 11:07:13  _____________________________________________________________________  External Attachment:    Type:   Image     Comment:   External Document

## 2010-02-27 ENCOUNTER — Encounter: Payer: Self-pay | Admitting: Cardiology

## 2010-02-28 NOTE — Procedures (Signed)
Summary: PC2   Allergies (verified): 1)  ! * Morphine   PPM Specifications Following MD:  Lewayne Bunting, MD     PPM Vendor:  [employer]     PPM Model Number:  [id number]     PPM Serial Number:  [id number] PPM DOI:  01/10/2010     PPM Implanting MD:  Lewayne Bunting, MD  Lead 1    Location: RA     DOI: 06/20/2004     Model #: 9147     Serial #: [id number]     Status: active Lead 2    Location: RV     DOI: 06/20/2004     Model #: 6578     Serial #: [id number]     Status: active  Magnet Response Rate:  BOL 85 ERI 65  Indications:  SSS  Explantation Comments:  01/10/2010 [employer]onic EnRhythm [id number] H explanted  PPM Follow Up Remote Check?  No Battery Voltage:  2.79 V     Battery Est. Longevity:  11.5 years     Pacer Dependent:  No       PPM Device Measurements Atrium  Amplitude: 0.7 mV, Impedance: 498 ohms,  Right Ventricle  Amplitude: 11.20 mV, Impedance: 553 ohms, Threshold: 0.75 V at 0.4 msec  Episodes MS Episodes:  249     Percent Mode Switch:  92.9%     Coumadin:  Yes Ventricular High Rate:  0     Atrial Pacing:  13.5%     Ventricular Pacing:  4.5%  Parameters Mode:  DDDR     Lower Rate Limit:  60     Upper Rate Limit:  130 Paced AV Delay:  280     Sensed AV Delay:  280 Tech Comments:  Steri strips removed by the patient, no redness or edema noted.  A-fib with RVR noted. We will schedule her to see Dr. Dietrich Pates to address this. RV output reprogrammed for chronic threshold.  ROV 3 months with Dr. Ladona Ridgel in [place]. [name], LPN  February 08, 2010 1:56 PM

## 2010-02-28 NOTE — Cardiovascular Report (Signed)
Summary: Office Visit   Office Visit   Imported By: Roderic Ovens 02/15/2010 16:26:18  _____________________________________________________________________  External Attachment:    Type:   Image     Comment:   External Document

## 2010-02-28 NOTE — Miscellaneous (Signed)
Summary: labs cbcd,pt,ptt,01/03/2010  Clinical Lists Changes  Observations: Added new observation of CALCIUM: 9.6 mg/dL (04/54/0981 1:91) Added new observation of CREATININE: 1.00 mg/dL (47/82/9562 1:30) Added new observation of BUN: 13 mg/dL (86/57/8469 6:29) Added new observation of BG RANDOM: 90 mg/dL (52/84/1324 4:01) Added new observation of CO2 PLSM/SER: 31 meq/L (01/03/2010 8:58) Added new observation of CL SERUM: 105 meq/L (01/03/2010 8:58) Added new observation of K SERUM: 3.9 meq/L (01/03/2010 8:58) Added new observation of NA: 145 meq/L (01/03/2010 8:58) Added new observation of INR: 1.79  (01/03/2010 8:58) Added new observation of PT PATIENT: 21.0 s (01/03/2010 8:58) Added new observation of PTT PATIENT: 31 s (01/03/2010 8:58) Added new observation of ABSOLUTE BAS: 0.1 K/uL (01/03/2010 8:58) Added new observation of BASOPHIL %: 1 % (01/03/2010 8:58) Added new observation of EOS ABSLT: 0.1 K/uL (01/03/2010 8:58) Added new observation of % EOS AUTO: 2 % (01/03/2010 8:58) Added new observation of ABSOLUTE MON: 0.7 K/uL (01/03/2010 8:58) Added new observation of MONOCYTE %: 9 % (01/03/2010 8:58) Added new observation of ABS LYMPHOCY: 3.1 K/uL (01/03/2010 8:58) Added new observation of LYMPHS %: 40 % (01/03/2010 8:58) Added new observation of PLATELETK/UL: 233 K/uL (01/03/2010 8:58) Added new observation of RDW: 13.6 % (01/03/2010 8:58) Added new observation of MCHC RBC: 33.2 g/dL (02/72/5366 4:40) Added new observation of MCV: 96.7 fL (01/03/2010 8:58) Added new observation of HCT: 41.6 % (01/03/2010 8:58) Added new observation of HGB: 13.8 g/dL (34/74/2595 6:38) Added new observation of RBC M/UL: 4.30 M/uL (01/03/2010 8:58) Added new observation of WBC COUNT: 7.7 10*3/microliter (01/03/2010 8:58)

## 2010-02-28 NOTE — Progress Notes (Signed)
Summary: pt wants to know if she can take off strips   Phone Note Call from Patient Call back at Home Phone [phone] Call back at [phone]   Caller: pt Reason for Call: Talk to Nurse Summary of Call: pt needs to know if she can take of strips from pacemaker placement. Initial call taken by: Faythe Ghee,  January 29, 2010 8:48 AM  Follow-up for Phone Call        Pt. advised to keep strips on until her 2 week wound check on 02-08-10. Pt. states she understands instructions given. Follow-up by: Larita Fife Via LPN,  January 29, 2010 9:20 AM

## 2010-02-28 NOTE — Miscellaneous (Signed)
Summary: Device change out  Clinical Lists Changes  Observations: Added new observation of PPM DOI: 01/10/2010 (01/11/2010 13:57) Added new observation of PPM SERL#: [id number] (01/11/2010 13:57) Added new observation of PPM MODL#: [id number] (01/11/2010 13:57) Added new observation of MAGNET RTE: BOL 85 ERI 65 (01/11/2010 13:57) Added new observation of PPMEXPLCOMM: 01/10/2010 Med[employer]onic EnRhythm [id number] H explanted (01/11/2010 13:57)      PPM Specifications Following MD:  Lewayne Bunting, MD     PPM Vendor:  [employer]     PPM Model Number:  [id number]     PPM Serial Number:  [id number] PPM DOI:  01/10/2010     PPM Implanting MD:  Lewayne Bunting, MD  Lead 1    Location: RA     DOI: 06/20/2004     Model #: [id number]     Serial #: [id number]     Status: active Lead 2    Location: RV     DOI: 06/20/2004     Model #: 1027     Serial #: [id number]     Status: active  Magnet Response Rate:  BOL 85 ERI 65  Indications:  SSS  Explantation Comments:  01/10/2010 [employer] EnRhythm [id number] H explanted  PPM Follow Up Pacer Dependent:  Yes      Episodes Coumadin:  Yes  Parameters Mode:  DDDR     Lower Rate Limit:  50     Upper Rate Limit:  130 Paced AV Delay:  300     Sensed AV Delay:  300

## 2010-03-06 NOTE — Assessment & Plan Note (Signed)
Summary: ROV CHANGE IN HEART RYTHM PER PT CHECK OUT 02/08/10 W [name]   Visit Type:  Follow-up Primary Provider:  Dr. Janna Arch   History of Present Illness: [name] is seen in the office today at the request of our electrophysiology nurse for tachycardia.  This nice woman has sick sinus syndrome, originally underwent pacemaker implantation in 2006 and has recently required a generator change.  With more intensive monitoring, excessively high rates in atrial fibrillation have been noted.  Eyonna denies dyspnea on exertion, chest discomfort, palpitations, lightheadedness or syncope.  I previously followed [name] for management of her arrhythmia, hypertension and hyperlipidemia.  She has known cerebrovascular disease, having undergone carotid endarterectomy in approximately 1999.  She is followed by a vascular surgeon who performs annual vascular studies.  She is chronically anticoagulated with warfarin dosage adjustment managed by her primary care physician.  Current Medications (verified): 1)  Coumadin 5 Mg Tabs (Warfarin Sodium) .... Take As Directed 2)  Verapamil Hcl 120 Mg Tabs (Verapamil Hcl) .... Take 1 Tablet By Mouth Three Times A Day 3)  Carvedilol 25 Mg Tabs (Carvedilol) .... Take 1 Tablet By Mouth Two Times A Day 4)  Alprazolam 1 Mg Tabs (Alprazolam) .... Take 1/4   Tab By Mouth At Bedtime 5)  Vitamin C 1000 Mg Tabs (Ascorbic Acid) .... Take 1 Tab Three Times A Day 6)  Vitamin E 400 Unit Caps (Vitamin E) .... Take 1 Tab Two Times A Day 7)  Lithium Carbonate 1200 .... Take 1 Tab Daily 8)  Calcium 1200 .... Take 1 Tab Three Times A Day 9)  Losartan Potassium 50 Mg Tabs (Losartan Potassium) .... Take 1 Tablet By Mouth Once A Day 10)  Crestor 5 Mg Tabs (Rosuvastatin Calcium) .... Take 1 Tab Daily 11)  Niacin 250 Mg Tabs (Niacin) .... Take 1 Tab Daily 12)  Fish Oil 1000 Mg Caps (Omega-3 Fatty Acids) .... Take 1 Tab Daily 13)  Folic Acid 400 Mcg Tabs (Folic Acid) .... Take 1  Tab Daily 14)  Furosemide 20 Mg Tabs (Furosemide) .... Take One Tablet By Mouth Daily. 15)  Potassium Chloride Cr 10 Meq Cr-Caps (Potassium Chloride) .... Take 1 Tablet By Mouth Once A Day 16)  Vitamin A Acetate  Crys (Vitamin A Acetate) .... Take 1 Tab Daily 17)  B Complex Vitamins  Caps (B Complex Vitamins) .... Take 1 Tab Daily 18)  Cod Liver Oil  Oil (Cod Liver Oil) .... Take 1 Tab Daily  Allergies (verified): 1)  ! * Morphine  Comments:  Nurse/Medical Assistant: patient brought med list we reviewed stated to her if she doesnt bring meds that she will be asked to reshedule her appt [facility] in [place] and mail order  doesnt know which one   Past History:  PMH, FH, and Social History reviewed and updated.  Past Medical History: Sick sinus: PAF in 8,9,10/05, 1/06; insignificant CAD in 8/05; DDD pacing(Medtronic Nrhythm)-5/06 Anticoagulation-followed by PMD Hyperlipidemia Hypertension Cerebrovascular disease-right carotid endarterectomy in 1999; negative duplex in 6/06 Bilateral pneumonia-2010 Fasting hyperglycemia  Past Surgical History: Appendectomy Hysterectomy Right carotid endarterectomy-1999 Pacemaker implantation-2006 Colonoscopy-2006: Space negative  CXR  Procedure date:  06/21/2004  Findings:      Left subclavian vein pacemaker Borderline cardiomegaly Healed right rib fractures Otherwise unremarkable.  Carotid Doppler  Procedure date:  07/18/2004  Findings:      Postsurgical changes-right internal carotid artery Minimal calcified plaque and intimal thickening of the proximal left internal carotid artery No hemodynamically significant stenosis  Cardiac Cath  Procedure date:  09/08/2003  Findings:      LVEDP-14 Left main-no significant disease LAD-mid 30% lesion; D1-25% ostial lesion RCA-large and dominant vessel with ostial 25% stenosis with spasm Circumflex-small vessel; no focal disease EF-75%  -  Date:  04/30/2006    Cholesterol:  176    LDL-calculated: 100    HDL: 41    Triglycerides: 161   Review of Systems       See history of present illness.  Vital Signs:  Patient profile:   76 year old female Weight:      155 pounds BMI:     25.89 O2 Sat:      95 % on Room air Pulse rate:   126 / minute BP sitting:   134 / 91  (left arm)  Vitals Entered By: Dreama Saa, CNA (February 26, 2010 2:51 PM)  O2 Flow:  Room air  Physical Exam  General:  Proportionate weight and height; well developed; no acute distress:   Neck-No JVD; no carotid bruits: Lungs-No tachypnea, no rales; no rhonchi; no wheezes: Cardiovascular-irregular and somewhat rapid rhythm; normal PMI; normal S1 and S2: Abdomen-BS normal; soft and non-tender without masses or organomegaly:  Musculoskeletal-No deformities, no cyanosis or clubbing: Neurologic-Normal cranial nerves; symmetric strength and tone:  Skin-Warm, no significant lesions: Extremities-Nl distal pulses; no edema:     PPM Specifications Following MD:  Lewayne Bunting, MD     PPM Vendor:  Medtronic     PPM Model Number:  [id number]     PPM Serial Number:  [id number] PPM DOI:  01/10/2010     PPM Implanting MD:  Lewayne Bunting, MD  Lead 1    Location: RA     DOI: 06/20/2004     Model #: [id number]     Serial #: [id number]     Status: active Lead 2    Location: RV     DOI: 06/20/2004     Model #: [id number]     Serial #: [id number]     Status: active  Magnet Response Rate:  BOL 85 ERI 65  Indications:  SSS  Explantation Comments:  01/10/2010 Medtronic EnRhythm [id number] H explanted  PPM Follow Up Pacer Dependent:  No      Episodes Coumadin:  Yes  Parameters Mode:  DDDR     Lower Rate Limit:  60     Upper Rate Limit:  130 Paced AV Delay:  280     Sensed AV Delay:  280  Impression & Recommendations:  Problem # 1:  SICK SINUS SYNDROME (ICD-427.81) Patient is probably permanently in atrial fibrillation; excessive ventricular rates were identified at a recent visit for pacemaker  assessment.  Carvedilol will be increased to 12.5 mg b.i.d. and then 25 mg b.i.d.  Patient experienced extreme fatigue with Inderal in the past.  If this recurs with carvedilol, it will probably be necessary to add digoxin to her medical regime.  A return office visit is anticipated in 12 months.  She is scheduled for reassessment of her pacemaker in April at which time the adequacy of heart rate control can be reexamined.  Patient Instructions: 1)  Your physician recommends that you schedule a follow-up appointment in: 1 YEAR 2)  Your physician has recommended you make the following change in your medication: INCREASE CARVEDILOL TO 25MG    1/2 TABLET BY MOUTH TWICE DAILY X2 WEEKS THEN INCREASE TO 1 TABLET BY MOUTH TWICE DAILY Prescriptions: LOSARTAN POTASSIUM 50 MG TABS (LOSARTAN POTASSIUM) Take 1 tablet  by mouth once a day  #90 x 3   Entered by:   Teressa Lower RN   Authorized by:   Kathlen Brunswick, MD, [facility]   Signed by:   Teressa Lower RN on 02/27/2010   Method used:   Electronically to        [facility]* (mail-order)       [address]       [place], [place]  [address]       Ph: [phone]       Fax: [phone]   RxID:   [id number] CARVEDILOL 25 MG TABS (CARVEDILOL) Take 1 tablet by mouth two times a day  #180 x 3   Entered by:   Teressa Lower RN   Authorized by:   Kathlen Brunswick, MD, [facility]   Signed by:   Teressa Lower RN on 02/27/2010   Method used:   Electronically to        [facility]* (mail-order)       [address]       [place], [place]  [address]       Ph: [phone]       Fax: [phone]   RxID:   [id number] CARVEDILOL 25 MG TABS (CARVEDILOL) Take 1/2  tablet by mouth twice a day x2 weeks then Take 1 tablet by mouth two times a day  #30 x 0   Entered by:   Teressa Lower RN   Authorized by:   Kathlen Brunswick, MD, [facility]   Signed by:   Teressa Lower RN on 02/27/2010   Method used:   Electronically to         [facility] [phone]* (retail)       [address] [place], [place]  [address]       Ph: [phone]       Fax: [phone]   RxID:   [id number] CARVEDILOL 25 MG TABS (CARVEDILOL) Take 1/2  tablet by mouth twice a day x2 weeks then Take 1 tablet by mouth two times a day  #60 x 3   Entered by:   Teressa Lower RN   Authorized by:   Kathlen Brunswick, MD, [facility]   Signed by:   Teressa Lower RN on 02/26/2010   Method used:   Electronically to        [facility]  [address] (retail)       [address]       [place], [place]  [address]       Ph: [phone]       Fax: [phone]   RxID:   [id number]   Handout requested.

## 2010-03-07 NOTE — Op Note (Signed)
  NAME:  Traci [name], OPLINGER NO.:  [id number]  MEDICAL RECORD NO.:  [id number]          PATIENT TYPE:  OIB  LOCATION:  2899                         FACILITY:  [facility]  PHYSICIAN:  Doylene Canning. Ladona Ridgel, MD    DATE OF BIRTH:  [birth year]  DATE OF PROCEDURE:  01/10/2010 DATE OF DISCHARGE:  01/10/2010                              OPERATIVE REPORT   PROCEDURE PERFORMED:  Removal of a previously implanted dual-chamber pacemaker, which had reached elective replacement indication and insertion of a new dual-chamber device.  INDICATIONS:  Symptomatic bradycardia.  INTRODUCTION:  The patient is a 76 year old woman with symptomatic tachy- brady syndrome on a permanent pacemaker insertion in 2006.  Her device reached elective replacement indication, and she is now referred for removal of old device and insertion of a new.  PROCEDURE:  After informed consent was obtained, the patient was taken to the diagnostic EP lab in fasting state.  After usual preparation and draping, intravenous fentanyl and midazolam was given for sedation.  A 30 mL of lidocaine was infiltrated in the left infraclavicular region. A 5-cm incision was carried out over this region.  Electrocautery was utilized to dissect down to the fascial plane.  Electrocautery was utilized to dissect down to the pacemaker pocket.  The old in rhythm dual-chamber pacemaker was removed.  The pocket was irrigated with antibiotic irrigation.  The pacemaker was disconnected from the atrial and ventricular leads.  There were evaluated and found be working satisfactorily.  New Medtronic Sensia dual-chamber pacemaker, serial number [id number] was connected to the atrial and ventricular leads and placed back in the subcutaneous pocket.  The pocket was irrigated with additional antibiotic irrigation.  The incision was closed with 2-0 and 3-0 Vicryl.  Benzoin and Steri-Strips were painted on the skin.  A pressure dressing was applied, and  the patient was returned to her room in satisfactory condition.  COMPLICATIONS:  There were no immediate procedure complications.  RESULTS:  Demonstrate successful removal of previously implanted Medtronic dual-chamber pacemaker and insertion of a new dual-chamber device without immediate procedure complications.     Doylene Canning. Ladona Ridgel, MD     GWT/MEDQ  D:  01/10/2010  T:  01/10/2010  Job:  [id number]  Electronically Signed by Lewayne Bunting MD on 03/07/2010 05:01:22 PM

## 2010-03-16 ENCOUNTER — Inpatient Hospital Stay (HOSPITAL_COMMUNITY)
Admission: EM | Admit: 2010-03-16 | Discharge: 2010-03-19 | DRG: 312 | Disposition: A | Discharge status: Home / Self Care | Payer: Medicare Other | Attending: Internal Medicine | Admitting: Internal Medicine

## 2010-03-16 ENCOUNTER — Emergency Department (HOSPITAL_COMMUNITY): Payer: Medicare Other

## 2010-03-16 DIAGNOSIS — R55 Syncope and collapse: Principal | ICD-10-CM | POA: Diagnosis present

## 2010-03-16 DIAGNOSIS — E785 Hyperlipidemia, unspecified: Secondary | ICD-10-CM | POA: Diagnosis present

## 2010-03-16 DIAGNOSIS — E871 Hypo-osmolality and hyponatremia: Secondary | ICD-10-CM | POA: Diagnosis present

## 2010-03-16 DIAGNOSIS — T448X5A Adverse effect of centrally-acting and adrenergic-neuron-blocking agents, initial encounter: Secondary | ICD-10-CM | POA: Diagnosis present

## 2010-03-16 DIAGNOSIS — S0003XA Contusion of scalp, initial encounter: Secondary | ICD-10-CM | POA: Diagnosis present

## 2010-03-16 DIAGNOSIS — I10 Essential (primary) hypertension: Secondary | ICD-10-CM | POA: Diagnosis present

## 2010-03-16 DIAGNOSIS — F0781 Postconcussional syndrome: Secondary | ICD-10-CM | POA: Diagnosis present

## 2010-03-16 DIAGNOSIS — E876 Hypokalemia: Secondary | ICD-10-CM | POA: Diagnosis present

## 2010-03-16 DIAGNOSIS — I4891 Unspecified atrial fibrillation: Secondary | ICD-10-CM | POA: Diagnosis present

## 2010-03-16 DIAGNOSIS — Y92009 Unspecified place in unspecified non-institutional (private) residence as the place of occurrence of the external cause: Secondary | ICD-10-CM

## 2010-03-16 DIAGNOSIS — Z45018 Encounter for adjustment and management of other part of cardiac pacemaker: Secondary | ICD-10-CM

## 2010-03-16 DIAGNOSIS — W108XXA Fall (on) (from) other stairs and steps, initial encounter: Secondary | ICD-10-CM | POA: Diagnosis present

## 2010-03-16 DIAGNOSIS — Z7901 Long term (current) use of anticoagulants: Secondary | ICD-10-CM

## 2010-03-16 LAB — CBC
HCT: 41.3 % (ref 36.0–46.0)
Hemoglobin: 14.4 g/dL (ref 12.0–15.0)
MCH: 32.3 pg (ref 26.0–34.0)
MCHC: 34.9 g/dL (ref 30.0–36.0)

## 2010-03-16 LAB — BASIC METABOLIC PANEL
BUN: 13 mg/dL (ref 6–23)
CO2: 25 mEq/L (ref 19–32)
GFR calc Af Amer: >60 mL/min (ref >60)
GFR calc non Af Amer: 57 mL/min — ABNORMAL LOW (ref >60)

## 2010-03-16 LAB — DIFFERENTIAL
Basophils Relative: 0 % (ref 0–1)
Monocytes Absolute: 0.7 10*3/uL (ref 0.1–1.0)
Monocytes Relative: 6 % (ref 3–12)
Neutro Abs: 8 10*3/uL — ABNORMAL HIGH (ref 1.7–7.7)

## 2010-03-16 LAB — URINALYSIS, ROUTINE W REFLEX MICROSCOPIC
Hgb urine dipstick: NEGATIVE
Protein, ur: NEGATIVE mg/dL
Urobilinogen, UA: 0.2 mg/dL (ref 0.0–1.0)

## 2010-03-16 LAB — PROTIME-INR
INR: 2.88 — ABNORMAL HIGH (ref 0.00–1.49)
Prothrombin Time: 30.2 seconds — ABNORMAL HIGH (ref 11.6–15.2)

## 2010-03-16 LAB — APTT: aPTT: 33 seconds (ref 24–37)

## 2010-03-17 ENCOUNTER — Observation Stay (HOSPITAL_COMMUNITY): Payer: Medicare Other

## 2010-03-17 LAB — DIFFERENTIAL
Basophils Relative: 1 % (ref 0–1)
Eosinophils Absolute: 0.2 10*3/uL (ref 0.0–0.7)
Lymphs Abs: 2 10*3/uL (ref 0.7–4.0)
Monocytes Absolute: 1 10*3/uL (ref 0.1–1.0)
Monocytes Relative: 8 % (ref 3–12)
Neutro Abs: 9.6 10*3/uL — ABNORMAL HIGH (ref 1.7–7.7)
Neutrophils Relative %: 75 % (ref 43–77)

## 2010-03-17 LAB — CBC
Hemoglobin: 12.7 g/dL (ref 12.0–15.0)
MCH: 32.2 pg (ref 26.0–34.0)
MCHC: 34.5 g/dL (ref 30.0–36.0)
MCV: 93.2 fL (ref 78.0–100.0)
RBC: 3.95 MIL/uL (ref 3.87–5.11)

## 2010-03-17 LAB — BASIC METABOLIC PANEL
BUN: 14 mg/dL (ref 6–23)
CO2: 29 mEq/L (ref 19–32)
Calcium: 9.2 mg/dL (ref 8.4–10.5)
GFR calc non Af Amer: 57 mL/min — ABNORMAL LOW (ref >60)
Glucose, Bld: 118 mg/dL — ABNORMAL HIGH (ref 70–99)
Sodium: 142 mEq/L (ref 135–145)

## 2010-03-17 NOTE — H&P (Signed)
NAME:  Traci [name], Traci [name] NO.:  [id number]  MEDICAL RECORD NO.:  [id number]           PATIENT TYPE:  I  LOCATION:  A304                          FACILITY:  [facility]  PHYSICIAN:  Arne Cleveland, MD       DATE OF BIRTH:  1934/09/12  DATE OF ADMISSION:  03/16/2010 DATE OF DISCHARGE:  LH                             HISTORY & PHYSICAL   PRIMARY CARE PHYSICIAN:  The patient goes to the [facility] and sees the physicians there, but has no one particular physician.  PRIMARY CARDIOLOGIST:  Gerrit Friends. Dietrich Pates, MD, [facility]  CHIEF COMPLAINT:  Syncope.  HISTORY OF PRESENT ILLNESS:  Traci [name] is a 76 year old Caucasian female who has a history of recently having a pacemaker replaced on January 10, 2010.  The pacemaker was initially implanted because of symptomatic bradycardia.  She had been feeling lightheaded for the past week since her primary care physician increased her Coreg.  Today, she was vacuuming stairs, was all the way to the top of the stairs when she had a syncopal episode.  She denied having any chest pain, palpitations, headache, nausea, vomiting, or any other symptoms prior to be a syncopal episode.  The husband heard her fall and came in.  She woke shortly after there was no seizure activity noted and there was no incontinence noted.  The husband did note the patient was confused post the fall and the syncopal episode.  Her confusion has improved since being in the emergency room and is continuing to improve and is felt most likely to be postconcussive confusion.  The patient was taken to the emergency room where a CT scan was done that showed no acute bleed.  They also did x-rays of her neck which showed no acute fracture.  The patient complains of being sore all over and has some ecchymotic regions in her left lower back.  PAST MEDICAL HISTORY: 1. Cardiac arrhythmia. 2. Sick sinus syndrome. 3. Hypertension. 4. Hypercholesterolemia. 5.  Carotid endarterectomy.  PAST SURGICAL HISTORY:  Hysterectomy, appendectomy, carotid endarterectomy, and pacemaker.  REVIEW OF SYSTEMS:  Pretty much as per history of present illness.  All systems were reviewed.  No other new findings were obtained.  The patient did complain of being lightheaded but that was not seen and just feeling kind of washed out since increasing the medicines but that was mentioned in history of present illness.  MEDICATIONS: 1. Calcium 1200 mg 3 times a day. 2. Coreg 6.25 mg twice a day. 3. Cod liver oil daily. 4. Crestor 5 mg daily. 5. Fish oil 1000 mg daily. 6. Folic acid, unsure about the dosage. 7. Lasix 20 mg daily. 8. Lecithin 1200 mg daily. 9. Losartan 50 mg daily. 10.Niacin 250 mg daily. 11.Potassium chloride 10 mEq daily. 12.Verapamil 120 mg 3 times a day. 13.Vitamin A 8000 mg once a day. 14.Vitamin C 1000 mg 3 times a day. 15.Vitamin E 400 International Units twice a day. 16.Coumadin, not sure what her dose on that it, however, that will be     continued.  SOCIAL HISTORY:  She is married.  She does not smoke, drink, or use illicit drugs.  FAMILY HISTORY:  Noncontributory.  PHYSICAL EXAMINATION:  GENERAL:  Well-developed, well-nourished, in no acute distress Caucasian female. VITAL SIGNS:  Blood pressure 139/94, pulse 102, respirations 18, temperature 98.7, pulse ox is 96%. HEAD:  Normocephalic.  She has a large contusion of 3 x 5 cm in the right occipital area. EYES:  Pupils are equal, round, and reactive to light.  Disks are sharp. Extraocular muscle range of motion is full. EAR, NOSE AND THROAT:  Normal. NECK:  Supple without venous distention, thyromegaly, or thyroid mass. She has had previous right carotid endarterectomy surgery. CHEST:  Nontender.  LUNGS:  Clear to auscultation and percussion. HEART:  Irregular rhythm and rate, approximately 108.  No murmur. ABDOMEN:  Soft, nontender with normoactive bowel sounds.   No hepatomegaly.  No splenomegaly.  No palpable mass. SKIN:  She has got some small hematoma areas in the right lower back which are new bruises that appear since her injury. NEUROLOGIC:  She is awake, alert, and oriented x3.  Cranial nerves II- XII intact.  Motor 5/5 in all extremities.  Sensory intact to light touch and pinprick.  IMPRESSION: 1. Syncope. 2. Clearing altered mental status post concussion syndrome. 3. Multiple trauma head, neck, upper and lower back. 4. Hyponatremia. 5. Hypokalemia. 6. Hypertension, poorly controlled. 7. Atrial fibrillation with rapid ventricular response. 8. History of pacemaker.  PLAN:  To admit to telemetry, get a pacemaker check, do neuro checks q.4 h. x6, have Physical Therapy evaluate the patient in the morning, correct her electrolytes, admit to [facility] Team to obtain a.m. labs to include CBC, BMP, and daily Pro-times.  Have them do a med reconciliation and continue most of her medicines.  She will now get prophylactic Lovenox for DVT prevention because she is on oral anticoagulation.  LABORATORY DATA:  Her EKG shows atrial fib with rapid ventricular response, rate about 110, nonspecific ST-T wave abnormalities, probably did have abnormal EKG.  Her Pro-time is 30.2 with an INR of 2.88.  Basic metabolic panel shows a sodium of 134, potassium of 3.2, and her glucose is high at 126.  CBC shows a white count of 11,200, hemoglobin 14.4, hematocrit 41.3, and normal differential.  Cardiac enzymes were normal. Troponin is negative.  Urinalysis negative on dipsticks.  CT of the head shows no acute intracranial abnormality, right parietal occipital scalp hematoma, atrophy, chronic ischemic white matter disease, and chronic-appearing left basal ganglia infarct, 1 cm calcification in the right cerebellar hemisphere.  This probably represents a cavernous hemangioma.  Followup MRI of the brain with and without gadolinium on a nonemergent basis is  recommended.  CT of the neck:  No cervical spine fracture, subluxation or dislocation, multilevel cervical spondylosis.  The patient will also get an MRI to evaluate the abnormality of the CT scan on a routine basis or carotid duplex scan routinely and echo routinely as further workup for her syncope.  Her cardiologist should be consulted in the morning and the patient should be admitted to observation and have neuro checks q.4 h. x6.  She will be seen by Physical Therapy and further evaluation and treatment as indicated by hospital course.     Arne Cleveland, MD     ML/MEDQ  D:  03/16/2010  T:  03/17/2010  Job:  [id number]  cc:   [facility]  Gerrit Friends. Dietrich Pates, MD, [facility] [address] [place], [place] [address]  Electronically Signed by Arne Cleveland  on 03/17/2010 06:35:22  AM

## 2010-03-18 ENCOUNTER — Observation Stay (HOSPITAL_COMMUNITY): Payer: Medicare Other

## 2010-03-18 DIAGNOSIS — R55 Syncope and collapse: Secondary | ICD-10-CM

## 2010-03-18 DIAGNOSIS — I369 Nonrheumatic tricuspid valve disorder, unspecified: Secondary | ICD-10-CM

## 2010-03-18 LAB — POCT I-STAT, CHEM 8
BUN: 14 mg/dL (ref 6–23)
Creatinine, Ser: 0.8 mg/dL (ref 0.4–1.2)
HCT: 41 % (ref 36.0–46.0)
Hemoglobin: 13.9 g/dL (ref 12.0–15.0)
TCO2: 29 mmol/L (ref 0–100)

## 2010-03-18 LAB — POCT CARDIAC MARKERS

## 2010-03-18 LAB — CARDIAC PANEL(CRET KIN+CKTOT+MB+TROPI)
Relative Index: INVALID (ref 0.0–2.5)
Relative Index: INVALID (ref 0.0–2.5)
Total CK: 36 U/L (ref 7–177)
Total CK: 36 U/L (ref 7–177)
Troponin I: <0.01 ng/mL (ref 0.00–0.06)

## 2010-03-18 LAB — URINE CULTURE
Colony Count: 25000
Culture  Setup Time: 201202192141

## 2010-03-19 LAB — DIFFERENTIAL
Basophils Absolute: 0 10*3/uL (ref 0.0–0.1)
Basophils Relative: 0 % (ref 0–1)
Eosinophils Absolute: 0.2 10*3/uL (ref 0.0–0.7)
Eosinophils Relative: 1 % (ref 0–5)
Monocytes Absolute: 0.6 10*3/uL (ref 0.1–1.0)

## 2010-03-19 LAB — CBC
HCT: 31.8 % — ABNORMAL LOW (ref 36.0–46.0)
MCHC: 33.3 g/dL (ref 30.0–36.0)
Platelets: 156 10*3/uL (ref 150–400)
RDW: 14.9 % (ref 11.5–15.5)

## 2010-03-19 LAB — PROTIME-INR: INR: 3.9 — ABNORMAL HIGH (ref 0.00–1.49)

## 2010-03-19 LAB — COMPREHENSIVE METABOLIC PANEL
Albumin: 2.9 g/dL — ABNORMAL LOW (ref 3.5–5.2)
BUN: 16 mg/dL (ref 6–23)
Calcium: 8.5 mg/dL (ref 8.4–10.5)
Chloride: 106 mEq/L (ref 96–112)
Creatinine, Ser: 0.83 mg/dL (ref 0.4–1.2)
GFR calc non Af Amer: >60 mL/min (ref >60)
Total Bilirubin: 0.6 mg/dL (ref 0.3–1.2)

## 2010-03-21 ENCOUNTER — Other Ambulatory Visit: Payer: Self-pay

## 2010-03-22 NOTE — Discharge Summary (Signed)
  NAME:  Traci [name], NAFF NO.:  [id number]  MEDICAL RECORD NO.:  [id number]           PATIENT TYPE:  I  LOCATION:  A304                          FACILITY:  [facility]  PHYSICIAN:  Wilson Singer, M.D.DATE OF BIRTH:  [birth year]  DATE OF ADMISSION:  03/16/2010 DATE OF DISCHARGE:  02/21/2012LH                              DISCHARGE SUMMARY   FINAL DISCHARGE DIAGNOSIS:  Syncopal episode likely related to escalation of beta-blocker.  CONDITION ON DISCHARGE:  Stable.  MEDICATIONS ON DISCHARGE: 1. Continue all the vitamins at home. 2. Losartan 50 mg daily. 3. Verapamil 120 mg t.i.d. 4. Potassium chloride 10 mEq daily. 5. Warfarin 5 mg, take one tablet Tuesday to Friday and half tablet     Saturday, Sunday, and Monday. 6. Furosemide and potassium deleted. 7. Fish oil 1000 mg daily. 8. Carvedilol 6.125 mg b.i.d. 9. Digoxin 0.125 mg daily. 10.Calcium carbonate with vitamin D as before 1 tablet three times a     day.  HISTORY:  This very pleasant at 76 year old lady was admitted with symptoms of syncopal episode.  She said that her symptoms began when her Coreg was increased by the primary care physician.  Please see initial history and physical examination done by Dr. Arne Cleveland.  HOSPITAL PROGRESS:  The patient was admitted.  Serial cardiac enzymes were cycled and these were negative for any cardiac ischemia.  Her Coreg was reduced and immediately she began to feel much better.  Also her pacemaker was checked, and there was no significantly clinical arrhythmia seen.  Today, she feels really well and wants to go home.  PHYSICAL EXAMINATION:  VITAL SIGNS:  Temperature 97.8, blood pressure 114/72, pulse 75, saturation 94% on room air. HEART:  Heart sounds are present and normal in atrial fibrillation. LUNGS:  Lung fields are clear. NEUROLOGIC:  She is alert and oriented without any focal neurologic signs.  She did have investigations while in the hospital  including a CT head scan, which did not show any acute abnormality.  There was a right parietal occipital scalp hematoma and some atrophy and chronic ischemic white matter and chronic-appearing left basal ganglia infarct.  CT scan of the cervical spine showed multilevel cervical spondylosis, most severe between C6 and C7.  She also had bilateral carotid Dopplers done, which did not show any hemodynamically significant stenosis.  As mentioned above, once her carvedilol was decreased, she felt much better and is ready go home now.  DISPOSITION:  The patient is stable to be discharged home and she will need to follow up with Cardiology in 1 week.  Her Lasix and potassium were discontinued.     Wilson Singer, M.D.     NCG/MEDQ  D:  03/19/2010  T:  03/19/2010  Job:  [id number]  cc:   [facility]  Gerrit Friends. Dietrich Pates, MD, [facility] [address] [place], [place] [address]  Electronically Signed by Lilly Cove M.D. on 03/22/2010 02:26:43 PM

## 2010-03-26 ENCOUNTER — Encounter: Payer: Self-pay | Admitting: Adult Health

## 2010-03-26 ENCOUNTER — Ambulatory Visit (INDEPENDENT_AMBULATORY_CARE_PROVIDER_SITE_OTHER): Payer: Medicare Other | Admitting: Adult Health

## 2010-03-26 DIAGNOSIS — I10 Essential (primary) hypertension: Secondary | ICD-10-CM

## 2010-03-26 DIAGNOSIS — R55 Syncope and collapse: Secondary | ICD-10-CM

## 2010-03-27 ENCOUNTER — Encounter: Payer: Self-pay | Admitting: Adult Health

## 2010-04-03 ENCOUNTER — Telehealth (INDEPENDENT_AMBULATORY_CARE_PROVIDER_SITE_OTHER): Payer: Self-pay

## 2010-04-04 NOTE — Assessment & Plan Note (Signed)
Summary: post Eather Colas [facility]/tg   Visit Type:  Follow-up Primary Provider:  Dr. Janna Arch   History of Present Illness: Traci [name] is a 76 y/o CF we are seeing post hospitalzation after admission for syncope secondary to medications.  She  had been taking higher doses of coreg, 25mg  two times a day and did not tolerate this secondary to hypotension. During hospitalization the coreg dose was decreased to 6.25mg  BIS and her symptoms improved significantly.    She had a history of symptomatic bradycardia and has DDDD pacemaker, atrial fib on coumadin, hypertension, hyperlipidemia, and mild carotid artery disease.  She is feeling much better, but still has a small hematoma on the parietal aspect of her scalp on the right.  She denies any significant pain, dizziness,blurred vision, but hematoma is sore to touch. She is otherwise asymptomatic  Current Medications (verified): 1)  Coumadin 5 Mg Tabs (Warfarin Sodium) .... Take As Directed 2)  Verapamil Hcl 120 Mg Tabs (Verapamil Hcl) .... Take 1 Tablet By Mouth Three Times A Day 3)  Carvedilol 6.25 Mg Tabs (Carvedilol) .... Take 1 Tablet By Mouth Two Times A Day 4)  Alprazolam 1 Mg Tabs (Alprazolam) .... Take 1/4   Tab By Mouth At Bedtime 5)  Vitamin C 1000 Mg Tabs (Ascorbic Acid) .... Take 1 Tab Three Times A Day 6)  Vitamin E 400 Unit Caps (Vitamin E) .... Take 1 Tab Two Times A Day 7)  Lithium Carbonate 1200 .... Take 1 Tab Daily 8)  Calcium 1200 .... Take 1 Tab Three Times A Day 9)  Losartan Potassium 50 Mg Tabs (Losartan Potassium) .... Take 1 Tablet By Mouth Once A Day 10)  Crestor 5 Mg Tabs (Rosuvastatin Calcium) .... Take 1 Tab Daily 11)  Niacin 250 Mg Tabs (Niacin) .... Take 1 Tab Daily 12)  Fish Oil 1000 Mg Caps (Omega-3 Fatty Acids) .... Take 1 Tab Daily 13)  Folic Acid 400 Mcg Tabs (Folic Acid) .... Take 1 Tab Daily 14)  Vitamin A Acetate  Crys (Vitamin A Acetate) .... Take 1 Tab Daily 15)  B Complex Vitamins  Caps (B Complex  Vitamins) .... Take 1 Tab Daily 16)  Cod Liver Oil  Oil (Cod Liver Oil) .... Take 1 Tab Daily 17)  Digoxin 0.125 Mg Tabs (Digoxin) .... Take 1 Tab Daily  Allergies (verified): 1)  ! * Morphine  Comments:  Nurse/Medical Assistant: patient brought med list she uses [facility]  Review of Systems       All other systems have been reviewed and are negative unless stated above.   Vital Signs:  Patient profile:   76 year old female Weight:      150 pounds BMI:     25.05 Pulse rate:   76 / minute BP sitting:   121 / 79  (left arm)  Vitals Entered By: Dreama Saa, CNA (March 26, 2010 11:49 AM)  Physical Exam  General:  Well developed, well nourished, in no acute distress. Head:  Smal hematoma tender to touch in the right parietal area. Eyes:  PERRLA/EOM intact; conjunctiva and lids normal. Lungs:  Clear bilaterally to auscultation and percussion. Heart:  Non-displaced PMI, chest non-tender; regular rate and rhythm, S1, S2 without murmurs, rubs or gallops. Carotid upstroke normal, no bruit. Normal abdominal aortic size, no bruits. Femorals normal pulses, no bruits. Pedals normal pulses. No edema, no varicosities. Abdomen:  Bowel sounds positive; abdomen soft and non-tender without masses, organomegaly, or hernias noted. No hepatosplenomegaly. Msk:  Back normal,  normal gait. Muscle strength and tone normal. Pulses:  pulses normal in all 4 extremities Extremities:  No clubbing or cyanosis. Neurologic:  Alert and oriented x 3.   PPM Specifications Following MD:  Lewayne Bunting, MD     PPM Vendor:  Medtronic     PPM Model Number:  [id number]     PPM Serial Number:  [id number] PPM DOI:  01/10/2010     PPM Implanting MD:  Lewayne Bunting, MD  Lead 1    Location: RA     DOI: 06/20/2004     Model #: [id number]     Serial #: [id number]     Status: active Lead 2    Location: RV     DOI: 06/20/2004     Model #: [id number]     Serial #: [id number]     Status: active  Magnet Response Rate:  BOL 85 ERI  65  Indications:  SSS  Explantation Comments:  01/10/2010 Medtronic EnRhythm [id number] H explanted  PPM Follow Up Pacer Dependent:  No      Episodes Coumadin:  Yes  Parameters Mode:  DDDR     Lower Rate Limit:  60     Upper Rate Limit:  130 Paced AV Delay:  280     Sensed AV Delay:  280  Impression & Recommendations:  Problem # 1:  HYPERTENSION (ICD-401.1) BP is well controlled on current dose of coreg, verapamil, and losartan.  She is asymptomatic.  I have given her Rx for coreg 6.25 mg two times a day for ease of dosing.  She will follow with [place] in 6 months. The following medications were removed from the medication list:    Furosemide 20 Mg Tabs (Furosemide) .Marland Kitchen... Take one tablet by mouth daily. Her updated medication list for this problem includes:    Verapamil Hcl 120 Mg Tabs (Verapamil hcl) .Marland Kitchen... Take 1 tablet by mouth three times a day    Carvedilol 6.25 Mg Tabs (Carvedilol) .Marland Kitchen... Take 1 tablet by mouth two times a day    Losartan Potassium 50 Mg Tabs (Losartan potassium) .Marland Kitchen... Take 1 tablet by mouth once a day  Problem # 2:  SICK SINUS SYNDROME (ICD-427.81) Follow with Dr. Ladona Ridgel in pacemaker clinic as directed. Her updated medication list for this problem includes:    Coumadin 5 Mg Tabs (Warfarin sodium) .Marland Kitchen... Take as directed    Verapamil Hcl 120 Mg Tabs (Verapamil hcl) .Marland Kitchen... Take 1 tablet by mouth three times a day    Carvedilol 6.25 Mg Tabs (Carvedilol) .Marland Kitchen... Take 1 tablet by mouth two times a day  Patient Instructions: 1)  Your physician recommends that you schedule a follow-up appointment in: 6 months 2)  Your physician has recommended you make the following change in your medication: Increase Coreg to 6.25mg  by mouth two times a day  Prescriptions: CARVEDILOL 6.25 MG TABS (CARVEDILOL) take 1 tablet by mouth two times a day  #180 x 1   Entered by:   Larita Fife Via LPN   Authorized by:   Joni Reining, NP   Signed by:   Larita Fife Via LPN on 11/23/2534   Method  used:   Electronically to        [facility]* (mail-order)       [address] [place]  [address]       Ph: [phone]       Fax: [phone]   RxID:   [id number]

## 2010-04-08 LAB — SURGICAL PCR SCREEN
MRSA, PCR: NEGATIVE
Staphylococcus aureus: NEGATIVE

## 2010-04-08 LAB — APTT: aPTT: 25 seconds (ref 24–37)

## 2010-04-09 NOTE — Progress Notes (Signed)
**Note De-Identified Wynell Halberg Obfuscation** Summary: digoxin questions  Phone Note Call from Patient   Caller: Patient Reason for Call: Talk to Nurse Summary of Call: patient has questions regarding Digoxin / tg Initial call taken by: Raechel Ache [facility],  April 03, 2010 11:30 AM  Follow-up for Phone Call        Pt. needs refilll. RX faxed to [facility]. Follow-up by: Larita Fife Ruthella Kirchman LPN,  April 03, 2010 2:26 PM    Prescriptions: DIGOXIN 0.125 MG TABS (DIGOXIN) take 1 tab daily  #90 x 1   Entered by:   Larita Fife Madai Nuccio LPN   Authorized by:   Joni Reining, NP   Signed by:   Larita Fife Lizmary Nader LPN on 11/23/2534   Method used:   Electronically to        [facility] (mail-order)       [address]       [place], [place]  [address]       Ph: [phone]       Fax: [phone]   RxID:   [id number]

## 2010-05-03 LAB — BASIC METABOLIC PANEL
BUN: 12 mg/dL (ref 6–23)
CO2: 27 mEq/L (ref 19–32)
Chloride: 104 mEq/L (ref 96–112)
Creatinine, Ser: 0.75 mg/dL (ref 0.4–1.2)

## 2010-05-04 LAB — BASIC METABOLIC PANEL
BUN: 13 mg/dL (ref 6–23)
Chloride: 107 mEq/L (ref 96–112)
Glucose, Bld: 173 mg/dL — ABNORMAL HIGH (ref 70–99)
Potassium: 3.9 mEq/L (ref 3.5–5.1)

## 2010-05-14 LAB — PROTIME-INR
INR: 1.5 (ref 0.00–1.49)
INR: 1.4 (ref 0.00–1.49)
INR: 1.5 (ref 0.00–1.49)
INR: 1.8 — ABNORMAL HIGH (ref 0.00–1.49)
INR: 1.9 — ABNORMAL HIGH (ref 0.00–1.49)
INR: 2.4 — ABNORMAL HIGH (ref 0.00–1.49)
INR: 2.5 — ABNORMAL HIGH (ref 0.00–1.49)
Prothrombin Time: 21.4 seconds — ABNORMAL HIGH (ref 11.6–15.2)
Prothrombin Time: 22.7 seconds — ABNORMAL HIGH (ref 11.6–15.2)
Prothrombin Time: 27.6 seconds — ABNORMAL HIGH (ref 11.6–15.2)
Prothrombin Time: 29.4 seconds — ABNORMAL HIGH (ref 11.6–15.2)

## 2010-05-14 LAB — CULTURE, BLOOD (ROUTINE X 2): Culture: NO GROWTH

## 2010-05-14 LAB — DIFFERENTIAL
Basophils Absolute: 0 10*3/uL (ref 0.0–0.1)
Basophils Relative: 0 % (ref 0–1)
Basophils Relative: 1 % (ref 0–1)
Eosinophils Absolute: 0.1 10*3/uL (ref 0.0–0.7)
Eosinophils Relative: 1 % (ref 0–5)
Lymphs Abs: 1.3 10*3/uL (ref 0.7–4.0)
Monocytes Absolute: 0.6 10*3/uL (ref 0.1–1.0)
Monocytes Relative: 7 % (ref 3–12)
Neutro Abs: 6.3 10*3/uL (ref 1.7–7.7)
Neutro Abs: 3.7 10*3/uL (ref 1.7–7.7)
Neutrophils Relative %: 77 % (ref 43–77)
Neutrophils Relative %: 48 % (ref 43–77)

## 2010-05-14 LAB — BASIC METABOLIC PANEL
BUN: 7 mg/dL (ref 6–23)
CO2: 29 mEq/L (ref 19–32)
CO2: 28 mEq/L (ref 19–32)
CO2: 30 mEq/L (ref 19–32)
Calcium: 8.6 mg/dL (ref 8.4–10.5)
Chloride: 107 mEq/L (ref 96–112)
Chloride: 112 mEq/L (ref 96–112)
Creatinine, Ser: 0.54 mg/dL (ref 0.4–1.2)
Creatinine, Ser: 0.63 mg/dL (ref 0.4–1.2)
GFR calc Af Amer: >60 mL/min (ref >60)
GFR calc non Af Amer: >60 mL/min (ref >60)
Glucose, Bld: 100 mg/dL — ABNORMAL HIGH (ref 70–99)
Glucose, Bld: 95 mg/dL (ref 70–99)
Glucose, Bld: 97 mg/dL (ref 70–99)
Potassium: 3.5 mEq/L (ref 3.5–5.1)
Potassium: 3.5 mEq/L (ref 3.5–5.1)
Sodium: 140 mEq/L (ref 135–145)
Sodium: 140 mEq/L (ref 135–145)

## 2010-05-14 LAB — CBC
Hemoglobin: 14.2 g/dL (ref 12.0–15.0)
Hemoglobin: 12 g/dL (ref 12.0–15.0)
MCHC: 34.2 g/dL (ref 30.0–36.0)
MCHC: 34.6 g/dL (ref 30.0–36.0)
MCV: 95.2 fL (ref 78.0–100.0)
Platelets: 175 10*3/uL (ref 150–400)
RBC: 4.32 MIL/uL (ref 3.87–5.11)
RDW: 13.2 % (ref 11.5–15.5)
RDW: 13.3 % (ref 11.5–15.5)
WBC: 8.2 10*3/uL (ref 4.0–10.5)

## 2010-05-14 LAB — EXPECTORATED SPUTUM ASSESSMENT W GRAM STAIN, RFLX TO RESP C

## 2010-05-14 LAB — DIGOXIN LEVEL: Digoxin Level: <0.2 ng/mL — ABNORMAL LOW (ref 0.8–2.0)

## 2010-05-29 ENCOUNTER — Telehealth: Payer: Self-pay | Admitting: Cardiology

## 2010-05-29 NOTE — Telephone Encounter (Signed)
Patient states that she is taking 4 different medications for her heart and she can't hardly stand it / states that she is too weak to even go anywhere / pls return call / tg

## 2010-05-29 NOTE — Telephone Encounter (Signed)
Scheduled pt an appt to speak with md

## 2010-05-31 ENCOUNTER — Ambulatory Visit (INDEPENDENT_AMBULATORY_CARE_PROVIDER_SITE_OTHER): Payer: Medicare Other | Admitting: Adult Health

## 2010-05-31 ENCOUNTER — Encounter: Payer: Self-pay | Admitting: Adult Health

## 2010-05-31 ENCOUNTER — Telehealth: Payer: Self-pay | Admitting: *Deleted

## 2010-05-31 VITALS — BP 132/79 | HR 71 | Ht 65.0 in | Wt 156.0 lb

## 2010-05-31 DIAGNOSIS — R5383 Other fatigue: Secondary | ICD-10-CM | POA: Insufficient documentation

## 2010-05-31 NOTE — Progress Notes (Signed)
HPI: Traci [name] comes today at her request with complaints of severe fatigue with use of coreg. She apparently was hospitalized secondary to syncopal episode on Coreg 25mg  BID. This was d/c'd.  However recent check of pacemaker revealed rapid HR's off of this medication. She was placed back on low dose coreg 6.25mg  BID 2 months ago. She states she is becoming more and more fatigued and dizzy on this medication.  She does not want to take it anymore.  She also wants to stop verapamil.  She has a history of hypertension, SSS s/p DDDR pacemaker placed by Dr. Ladona [name] 12/2009.  She states that by the end of the day, she is so tired that she can hardly stand it, but cannot go to sleep.  She occasionally takes one of her husband's Xanax which is helpful to her. She is quiet anxious about this.  Allergies  Allergen Reactions  . Morphine     Current Outpatient Prescriptions  Medication Sig Dispense Refill  . ALPRAZolam (XANAX) 1 MG tablet Take 1 mg by mouth at bedtime as needed.        . Ascorbic Acid (VITAMIN C) 1000 MG tablet Take 1,000 mg by mouth 3 (three) times daily.        . Calcium Carbonate-Vit D-Min (CALCIUM 1200 PO) Take 1 capsule by mouth daily.        . COD LIVER OIL PO Take 1 tablet by mouth daily.        . digoxin (LANOXIN) 0.25 MG tablet Take 250 mcg by mouth daily.        Marland Kitchen LITHIUM CARBONATE PO Take 1 tablet by mouth daily.        Marland Kitchen losartan (COZAAR) 50 MG tablet Take 50 mg by mouth daily.        . niacin 250 MG tablet Take 250 mg by mouth daily with breakfast.        . Omega-3 Fatty Acids (FISH OIL) 1000 MG CAPS Take 1 capsule by mouth daily.        . rosuvastatin (CRESTOR) 5 MG tablet Take 5 mg by mouth daily.        . verapamil (CALAN-SR) 120 MG CR tablet Take 120 mg by mouth at bedtime.        . vitamin A 16109 UNIT capsule Take 10,000 Units by mouth daily.        . vitamin E 400 UNIT capsule Take 400 Units by mouth daily.        Marland Kitchen warfarin (COUMADIN) 5 MG tablet Take 5 mg by mouth  daily.        Marland Kitchen DISCONTD: carvedilol (COREG) 6.25 MG tablet Take 6.25 mg by mouth 2 (two) times daily with a meal.        . DISCONTD: b complex vitamins tablet Take 1 tablet by mouth daily.        Marland Kitchen DISCONTD: folic acid (FOLVITE) 1 MG tablet Take 1 mg by mouth daily.         Past Medical History  Diagnosis Date  . Sick sinus syndrome     PAF in 8,9,10/2003,1/06;insignificant CAD in  08/2003  . Sick sinus syndrome     ddd pacing medtronic rhythm 05/2004   . Chronic anticoagulation     followed by PMD  . Hyperlipidemia   . Hypertension   . Cerebrovascular disease     right carotid endarectomy in 1999;negative duplex in 06/2004  . Pneumonia     bilateral pneumonia 2010  .  Hyperglycemia     fasting    Past Surgical History  Procedure Date  . Appendectomy   . Abdominal hysterectomy   . Right carotid endarectomy     1999  . Pacemaker placement     2006  . Colonoscopy     2006    ROS: Profound fatigue and dizziness. PHYSICAL EXAM BP 132/79  Pulse 71  Ht 5\' 5"  (1.651 m)  Wt 156 lb (70.761 kg)  BMI 25.96 kg/m2  SpO2 96%  General: Well developed, well nourished, in no acute distress Head: Eyes PERRLA, No xanthomas.   Normal cephalic and atramatic  Lungs: Clear bilaterally to auscultation and percussion. Heart: HRRR S1 S2,.  Pulses are 2+ & equal.            No carotid bruit. No JVD.  No abdominal bruits. No femoral bruits. Abdomen: Bowel sounds are positive, abdomen soft and non-tender without masses or                  Hernia's noted. Msk:  Back normal, normal gait. Normal strength and tone for age. Extremities: No clubbing, cyanosis or edema.  DP +1 Neuro: Alert and oriented X 3. Psych:  Anxious, responds appropriately   ASSESSMENT AND PLAN

## 2010-05-31 NOTE — Assessment & Plan Note (Signed)
She believes this is related to her use of coreg as she was unable to tolerate this before. She is also concerned about using verapamil.  I have reviewed her recent echo and EF is normal. Orthostatics have been completed on this visit and were negative.  I will stop the coreg and have her follow-up with Dr.Taylor for discussion of symptoms and he recommendations.  Pacemaker syndrome could be a differential diagnosis with syncope, fatigue, lethargy, lightheadedness.  I will leave this to his expertise to distinguish.

## 2010-05-31 NOTE — Telephone Encounter (Signed)
Pt would like to change cardiologist from you to Dr. Daleen Squibb She states she has no confidence in you since her hospitalization in February  [place], she feels that her fall and concussion came from her carvedilol Please advise,         Note to Chares Slaymaker: pt needs an appt with cardiologist in 3 months

## 2010-05-31 NOTE — Patient Instructions (Signed)
Your physician recommends that you schedule a follow-up appointment in: 3 months with Dr. Dietrich Pates, next week Dr. Ladona Ridgel Your physician has recommended you make the following change in your medication: stop carvedilol

## 2010-06-01 NOTE — Consult Note (Signed)
NAME:  Traci [name], Traci [name] NO.:  [id number]  MEDICAL RECORD NO.:  [id number]           PATIENT TYPE:  I  LOCATION:  A304                          FACILITY:  [facility]  PHYSICIAN:  Gerrit Friends. Dietrich Pates, MD, FACCDATE OF BIRTH:  11-14-1934  DATE OF CONSULTATION:  03/18/2010 DATE OF DISCHARGE:                                CONSULTATION   PRIMARY CARDIOLOGIST:  Gerrit Friends. Dietrich Pates, MD, [facility]  PRIMARY CARE PHYSICIAN:  Physicians at the [facility].  REQUESTING PHYSICIAN:  [facility], clinical, Team II.  REASON FOR CONSULTATION:  Syncope.  HISTORY OF PRESENT ILLNESS:  A 76 year old Caucasian female admitted with syncopal episode which caused her to have a fall.  She does not recall the events concerning the fall.  She states that she had been talking in a phone with her sister and not so lasting to remember she states, her husband states that she walked across the room and walked up stairs and grabbed a mop and begin mopping.  She states that the next thing she remembers is waking up on the floor with EMS around.  She said that her husband states that he had been did hear her fall and he came in and saw her lying on the floor at the bottom of stairs.  She does not remember this, she had no aura prior to that, she was not incontinent. He called the EMS because he was unable to wake her up.  She states that she woke up and noticed the EMS people with her and she had a bump on her head and her head was hurting.  They brought her to the emergency room for evaluation in the setting.  The patient's vital signs on evaluation in the emergency room revealed a heart rate of 114 beats per minute, AFib with RVR, she was also noted to have a blood pressure that was 159/94.  The patient had a CT scan of her head revealing no intracranial bleed.  She had a questionable cavernous hemangioma.  The patient is currently on Coumadin in the setting of atrial fibrillation, her INR was  elevated at 3.56 on admission.  However, there was no active bleeding seen.  The patient was most recently seen by Dr. Dietrich Pates in the office on a clinical visit at the request of the pacemaker nurse secondary to elevated heart rate and a post-pacemaker implantation check.  She had recently had a new pacemaker placed in December which was a replacement secondary to end-of-life of the old pacemaker, the pacemaker was interrogated, it was found that she was having excessively high rates of atrial fibrillation.  She denied any dizziness associated with it.  As a result of this, Dr. Dietrich Pates began her on higher doses of Coreg at 12.5 mg b.i.d. from 6.25 mg b.i.d. to hopefully control her heart rate.  He stated that if she became fatigue or was unable to tolerate this medication, then digoxin will be started instead.  The patient states that she would take 1-1/2 tablets of the Coreg in the morning and 1 tablet in the evening as she knows how sensitive she  is to medications and did not take the full dose of Coreg 12.5 mg b.i.d. as directed.  She did this because she began to feel very tired on the new medication regimen.  But she never did have any dizziness or syncope at that time.  She states that approximately 1 week after increasing her dose she did began to have some mild dizziness, but no presyncope.  The patient also had her pacemaker checked in the emergency room by a [employer] representative.  The patient's histogram revealed that she had high heart rates noted on February 14, 16, and 17 with a maximum ventricular rate between 171 and 208 with an average heart rate between 108 and 97 beats per minute.  The patient had approximately 93 atrial heart rate episodes noted with 90.1% mode switch count.  No changes were made to the pacemaker on evaluation in the ER.  REVIEW OF SYSTEMS:  Amnesia concerning events of fall, tired feeling, and syncopal episode.  All other systems are reviewed  and found to be negative.  PAST MEDICAL HISTORY: 1. Symptomatic bradycardia.     a.     Status post replacement of a dual-chamber Medtronic      pacemaker on December 2011, per Dr. Gilman Schmidt secondary to      symptomatic bradycardia. 2. Atrial fibrillation, on Coumadin. 3. Hypertension. 4. Hyperlipidemia. 5. Carotid artery disease.     a.     Carotid artery duplex study completed on March 17, 2010,      revealing turbulent flow within the right ICA distal to      approximately 40% stenosis by irregular calcification that      presumed distal aspect of the graft from prior right carotid      endarterectomy.  There was no hemodynamically significant stenosis      in either carotid arterial system.  Flow within the right      vertebral artery was not visualized, although there are      intervening echogenic calcifications that could cause technical      obstruction of the flow versus occlusion.  Left carotid artery      revealed minimal focal echogenic plaque noted in the left carotid      bulb producing approximately 20% stenosis by gray-scale criteria.      There was also echogenic showing plaque at the proximal ICA      producing less than 20% stenosis.  Most recent echocardiogram      dated on March 18, 2010, revealing LV cavity size normal, EF      was 65-70% with an increased pattern of mild LVH.  PAST SURGICAL HISTORY:  Right carotid endarterectomy, hysterectomy, appendectomy, and recent pacemaker implantation secondary to end-of-life of prior pacemaker.  SOCIAL HISTORY:  She lives in [place] with her husband.  She does not smoke, drink, or use drugs.  FAMILY HISTORY:  Mother deceased from a CVA and myocardial infarction. Also her father was deceased with atherosclerosis and had a CVA.  A sister with an MI and a sister with cancer.  CURRENT MEDICATIONS PRIOR TO ADMISSION: 1. Calcium 1200 mg t.i.d. 2. Coreg 6.25 mg b.i.d., 3. Cod liver well. 4. Crestor 5 mg  daily. 5. Fish oil 1000 mg daily. 6. Folic acid. 7. Lasix 20 mg daily. 8. Lecithin 1200 mg daily. 9. Losartan 50 mg daily. 10.Niacin 250 mg daily. 11.Potassium 10 mEq daily. 12.Verapamil 120 mg t.i.d. 13.Coumadin based on PT/INR.  ALLERGIES:  MORPHINE AND PENICILLIN.  CURRENT LABS:  Sodium 142, potassium 4.3, chloride 105, CO2 of 29, BUN 14, creatinine 0.96, glucose 118, hemoglobin 12.7, hematocrit 36.8, white blood cells 12.8, platelets 184.  PT 35.6, INR 3.56.  RADIOLOGY:  Please see above under carotid studies and CT scan of the head.  EKG revealing atrial fibrillation course with a rate of 109 beats per minute without ischemic changes.  PHYSICAL EXAMINATION:  VITAL SIGNS:  Blood pressure 107/71, pulse 95, respirations 20, temperature 97.8, O2 sat 97% on 2 liters, and weight 70.6 kg.  GENERAL:  She is awake, alert and oriented, but does not remember events causing fall. HEENT:  Head is normocephalic and atraumatic.  Eyes, PERRLA.  Mucous membranes in the mouth are pink and moist. NECK:  Supple.  There is a right-sided carotid bruit but negative JVD is noted. CARDIOVASCULAR:  Irregular rhythm and rate mildly tachycardiac without murmurs, rubs, or gallops.  Pulses are 2+ and equal without bruits. LUNGS:  Clear to auscultation without wheezes, rales, or rhonchi. ABDOMEN:  Soft, nontender with 2+ bowel sounds. EXTREMITIES:  Without joint deformity or effusions. NEURO:  Cranial nerves II through XII are grossly intact.  IMPRESSION: 1. Syncopal episode, uncertain etiology, she does not remember events.     Only waking afterwards, CT scan of the head is negative for bleed.     Carotid studies do not show any significant carotid artery     stenosis.  Orthostatics are negative.  Currently we will     discontinue Lasix at this time as she has a normal LV function and     has no evidence of fluid retention to avoid any orthostatics which     may been occurring at home. 2. Atrial  fibrillation, had been increased to Coreg 12.5 mg b.i.d.     secondary to rapid ventricular rates, however, this has been     discontinued and decreased to 6.25 mg b.i.d.  We will begin digoxin     0.125 mg daily and titrate as necessary for heart rate if she     tolerates this, watching heart rate.  Her pacemaker was checked in     the emergency room with average ventricular rate of 97-100 beats     per minute with high as to 208 beats per minute per histogram.  We     will not increase verapamil at this time to avoid any hypotension     and would continue cautious use of Coumadin.  We will also check a     TSH and monitor kidney function throughout hospitalization, further     evaluation by Neurology may be help if the syncopal episode     continues, however, there has been no such syncope during this     hospitalization.  On behalf of the physicians and providers of [facility], we would like to thank the [facility] for allowing [place] to participate in the care of this patient.     Bettey Mare. Lyman Bishop, NP   ______________________________ Gerrit Friends. Dietrich Pates, MD, [facility]    KML/MEDQ  D:  03/18/2010  T:  03/18/2010  Job:  [id number]  cc:   [facility]  Electronically Signed by Joni Reining NP on 04/29/2010 08:00:19 AM Electronically Signed by [place] Bing MD [facility] on 06/01/2010 10:13:55 PM

## 2010-06-02 NOTE — Telephone Encounter (Signed)
Records reviewed.  Patient hospitalized with syncope 2/18-21/2012.  Loss of consciousness occurred after carvedilol was titrated to 12.5 mg b.i.d.  Syncope was attributed to the up titration of beta blocker although the possible mechanism for this was not specified.  Orthostatic vital signs obtained in the hospital were normal as were basic laboratory studies.  Pacemaker function has been normal excluding bradycardia as a possible cause of her loss of consciousness.  At present, syncope is unexplained.  Patient requires additional evaluation and testing, which could be undertaken with Dr. Daleen Squibb at her request, if he agrees to do so.  Patient should be reminded that I will be the only cardiologist in the [place] office on most days and that if she is not comfortable with my care, her best course would be to seek treatment from [facility].

## 2010-06-04 NOTE — Telephone Encounter (Signed)
I spoke with Traci [name] in detail, will give me a final word on 06/11/10 when she sees Dr. Ladona Ridgel as to whether she will stay will [place] to see Dr. Daleen Squibb or start seeing [facility]

## 2010-06-11 ENCOUNTER — Encounter: Payer: Self-pay | Admitting: Internal Medicine

## 2010-06-11 ENCOUNTER — Ambulatory Visit (INDEPENDENT_AMBULATORY_CARE_PROVIDER_SITE_OTHER): Payer: Medicare Other | Admitting: Internal Medicine

## 2010-06-11 DIAGNOSIS — I4891 Unspecified atrial fibrillation: Secondary | ICD-10-CM

## 2010-06-11 DIAGNOSIS — I495 Sick sinus syndrome: Secondary | ICD-10-CM

## 2010-06-11 DIAGNOSIS — Z950 Presence of cardiac pacemaker: Secondary | ICD-10-CM

## 2010-06-11 NOTE — Assessment & Plan Note (Signed)
I strongly suspect that her fatigue and weakness are related to atrial fibrillation and uncontrolled ventricular response. I recommended that she undergo a cardiac monitor so we can better document what her actual heart rates have been. I would expect that we undergo a stress he rhythm control down the road once we have been able to determine what her ventricular rates look like in A. Fib. The patient has not responded to AV nodal blocking drugs having had syncope on carvedilol. She is now on verapamil though I suspect she is not doing much better with this drug.

## 2010-06-11 NOTE — Assessment & Plan Note (Signed)
OFFICE VISIT   IOMA, CHISMAR  DOB:  01/13/1935                                       03/28/2009  CHART#:[id number]   CHIEF COMPLAINT:  Evaluate carotid artery.   HISTORY OF PRESENT ILLNESS:  The patient is a 76 year old female  referred by Dr. Patrica Duel for evaluation of carotid disease.  She  previously had a right carotid endarterectomy in 2009.  She does not  remember who performed the procedure.  She was asymptomatic at the time  of her carotid endarterectomy.  She is referred today for evaluation for  recurrent carotid disease.  She has had no neurologic symptoms.  She  denies any symptoms of TIA, amaurosis or stroke.  She denies history of  tobacco abuse but states that she has been exposed to a large amount of  second hand smoke during the course of her lifetime.  She also has a  history of atrial fibrillation and is on warfarin for this.   She was also sent today for evaluation of her lower extremity peripheral  arterial disease.  She has no symptoms of claudication or rest pain.  She has no symptoms of prior ulcerations that would not heal easily on  her feet.   She is followed for her atrial fibrillation by [facility] in  [place].   CHRONIC MEDICAL PROBLEMS:  Include hypertension.  She also has a history  of atrial fibrillation and elevated cholesterol all of which are  currently well-controlled.   FAMILY HISTORY:  Unremarkable.   SOCIAL HISTORY:  She is married.  She has four children.  She works as a  housewife.  Smoking history is as listed above.  She does not consume  alcohol regularly.   REVIEW OF SYSTEMS:  Full 12 point review of systems were performed with  the patient today.  Please see intake referral form for details  regarding this.   PHYSICAL EXAM:  Vital signs:  Blood pressure is 143/82 in the right arm,  130/83 in the left arm, temperature is 98, heart rate 64 and regular.  HEENT:  Unremarkable.  Neck:   Has 2+ carotid pulses without bruit.  She  has no JVD.  There is a well-healed right neck scar.  Chest:  Clear to  auscultation.  Cardiac:  Regular rate and rhythm without murmur.  Abdomen:  Soft, nontender, nondistended.  No masses.  Extremities:  She  has 2+ radial, 2+ femoral, 2+ popliteal and 2+ dorsalis pedis pulses  bilaterally.  She has an absent right posterior tibial pulse.  She has a  2+ left posterior tibial pulse.  Musculoskeletal:  Exam shows no major  significant joint deformities.  Neurologic:  Exam shows symmetric upper  extremity and lower extremity motor strength which is 5/5.  She has no  pronator drift.  Skin:  Has no open ulcers or rashes.  Lymphatic:  Exam  shows no cervical, axillary or inguinal adenopathy.   MEDICATIONS:  Include propranolol, verapamil, warfarin, Crestor and  alprazolam.   ALLERGIES:  She has allergies listed to penicillin and morphine.   She had bilateral ABIs performed today which were normal bilaterally at  1.13 on the left and 1.16 on the right.   She had a carotid duplex exam today which showed less than 40% right  internal carotid artery stenosis and less than 40% left  internal carotid  artery stenosis.  Left vertebral artery was antegrade flow, right  vertebral artery was not visualized.   In summary, the patient has a history of previous carotid endarterectomy  with no recurrent stenosis.  She is currently asymptomatic.  She has no  significant recurrent disease or progression of disease on the  contralateral side.  She has normal ABIs bilaterally.  I believe the  best option for her would be a followup carotid duplex exam in 1 year.  If this is essentially normal at that time then she probably would not  require really any further followup for several years.     Janetta Hora. Fields, MD  Electronically Signed   CEF/MEDQ  D:  03/29/2009  T:  03/29/2009  Job:  [id number]   cc:   Patrica Duel, M.D.

## 2010-06-11 NOTE — Assessment & Plan Note (Signed)
[facility]                         ELECTROPHYSIOLOGY OFFICE NOTE   NAME:SARTINShawnee, [name]                        MRN:          [id number]  DATE:12/10/2006                            DOB:          07/27/1934    Traci [name] was seen today in the clinic in [place] on December 10, 2006, for followup of her Medtronic model #[id number] InRhythm.  Date of  implant was Jun 20, 2004, for sick sinus syndrome.   On interrogation of her device today, her battery voltage is 3.  P waves  measured 1.7 millivolts with an atrial capture threshold of 0.5 volts at  0.4 milliseconds and an atrial lead impedance of 552 ohms.  R waves  measured 8.8 millivolts with a ventricular capture threshold of 1 volt  at 0.4 milliseconds and a ventricular lead impedance of 488 ohms.  There  were 114 mode switch episodes noted totaling 1.5% of the time and she is  on Coumadin therapy.  She is atrially pacing 66.8% of the time and  ventricularly pacing 0.1% of the time.  Underlying rhythm today was a  sinus rhythm at 61 beats a minute.  No changes were made in her  parameters.   She will continue with her [facility] transmissions with a return office  visit in May 2009.      [name], LPN  Electronically Signed      Doylene Canning. Ladona Ridgel, MD  Electronically Signed   PO/MedQ  DD: 12/10/2006  DT: 12/10/2006  Job #: [id number]

## 2010-06-11 NOTE — Procedures (Signed)
CAROTID DUPLEX EXAM   INDICATION:  Follow up carotid artery disease.   HISTORY:  Diabetes:  No.  Cardiac:  Pacemaker.  Hypertension:  Yes.  Smoking:  No.  Previous Surgery:  Right carotid endarterectomy in 1999.  CV History:  No.  Amaurosis Fugax No, Paresthesias No, Hemiparesis No.                                       RIGHT             LEFT  Brachial systolic pressure:         153               137  Brachial Doppler waveforms:         Triphasic         Triphasic  Vertebral direction of flow:        Not visualized    Antegrade  DUPLEX VELOCITIES (cm/sec)  CCA peak systolic                   72                78  ECA peak systolic                   74                55  ICA peak systolic                   63                107  ICA end diastolic                   18                29  PLAQUE MORPHOLOGY:                  Calcific          Heterogenous  PLAQUE AMOUNT:                      Mild              Mild  PLAQUE LOCATION:                    ICA               ICA   IMPRESSION:  1. Right internal carotid artery suggests 1-39% stenosis.  2. Left internal carotid artery suggests 20-39% stenosis.  3. Right vertebral not visualized.   ___________________________________________  Janetta Hora Fields, MD   CB/MEDQ  D:  03/28/2009  T:  03/28/2009  Job:  [id number]

## 2010-06-11 NOTE — Patient Instructions (Signed)
Your physician recommends that you schedule a follow-up appointment in: 4 weeks with Dr. Taylor  Your physician has recommended that you wear a holter monitor. Holter monitors are medical devices that record the heart's electrical activity. Doctors most often use these monitors to diagnose arrhythmias. Arrhythmias are problems with the speed or rhythm of the heartbeat. The monitor is a small, portable device. You can wear one while you do your normal daily activities. This is usually used to diagnose what is causing palpitations/syncope (passing out).    

## 2010-06-11 NOTE — Discharge Summary (Signed)
NAME:  Traci [name], Traci [name] NO.:  [id number]   MEDICAL RECORD NO.:  [id number]          PATIENT TYPE:  INP   LOCATION:  A313                          FACILITY:  [facility]   PHYSICIAN:  Melvyn Novas, MDDATE OF BIRTH:  10-08-1934   DATE OF ADMISSION:  03/13/2008  DATE OF DISCHARGE:  LH                               DISCHARGE SUMMARY   The patient is a 76 year old white female with a history of paroxysmal  atrial fibrillation, hypertension, hyperlipidemia, pacemaker for atrial  fibrillation, and peripheral arterial disease with right carotid  endarterectomy in 1988.  The patient was seen in the hospital, found to  have cough, sputum.  A chest x-ray revealed bilateral pneumonia, right  and left side.  In the ER, she apparently had an episode of  paroxysmal  atrial fibrillation with rapid ventricular response.  She was placed on  IV Cardizem, did well, myocardial infarction was ruled out on the basis  of enzymes.  She had no anginal chest pain.  Apparently, had a normal  cath 3 years ago with insertion of pacemaker.  She continued to have  fulminant pneumonia, was placed on Rocephin and Zithromax empirically,  blood cultures were negative.  Sputum cultures and sensitivities were  essentially negative.  Chest x-ray evolved to show right and left-sided  evolution in progression of pneumonia to bilateral lower lobes with  clearance of upper lobe and middle lobe infiltrates.  Clinically, she  continued to improve on DuoNeb nebulizers and Rocephin and Zithromax.  Her anticoagulation status was addressed with immediate Lovenox and then  Coumadin which was increased, the dosage to 5 mg per day.  She was  therapeutic with INRs in the 2.4 to 2.5 and 2.6 range near discharge.   DISCHARGE MEDICATIONS:  1. Ventolin inhaler 2 puffs q.i.d.  2. Avelox 400 mg p.o. daily for 5 days.  3. Verapamil 120 mg p.o. q.8 h.  4. Benicar 40 mg p.o. daily.  5. Inderal 40 mg p.o. b.i.d.  6.  Coumadin 5 mg daily.  7. Crestor 5 mg per day.  8. Xanax 1 mg nightly.   DISCHARGE DIAGNOSES:  1. Atrial fibrillation.  2. Anticoagulation.  3. Hypertension.  4. Hyperlipidemia.  5. Peripheral arterial disease.  6. Anxiety.  7. Bilateral pneumonia.      Melvyn Novas, MD  Electronically Signed     RMD/MEDQ  D:  03/21/2008  T:  03/22/2008  Job:  [id number]

## 2010-06-11 NOTE — H&P (Signed)
NAME:  Traci [name], Traci [name] NO.:  [id number]   MEDICAL RECORD NO.:  [id number]          PATIENT TYPE:  INP   LOCATION:  A215                          FACILITY:  [facility]   PHYSICIAN:  Melvyn Novas, MDDATE OF BIRTH:  [birth year]   DATE OF ADMISSION:  03/13/2008  DATE OF DISCHARGE:  LH                              HISTORY & PHYSICAL   HISTORY OF PRESENT ILLNESS:  The patient is a 76 year old white female  with a 4-day history of some skin sensitivity or possible myalgias and  cough, which was nonproductive and feeling weak.  She was seen in my  office.  Lungs showed no significant adventitious signs, and she was  treated for suspected influenza.  She presents to the hospital last  night apparently with a temperature of 103, and chest x-ray revealed  bilateral infiltrates consistent with bronchopneumonia, right upper  lobe, right middle lobe, and left side also involving.  She was  subsequently admitted and placed on dual antibiotics, Rocephin and  Zithromax.  She denies angina, orthopnea, PND, or hemoptysis.  She does  have chronic palpitations and some significant anxiety issues, and she  was placed on Cardizem.  Scan through the telemetry monitoring shows  sinus rhythm with some mild sinus tachycardia throughout the night.  No  evidence of AFib.   PAST MEDICAL HISTORY:  1. Paroxysmal AFib.  2. Sick sinus syndrome.  3. Anxiety issues.  4. Hypertension.   PAST SURGICAL HISTORY:  1. Right carotid endarterectomy.  2. Appendectomy.  3. Vaginal hysterectomy.  4. Pacemaker insertion for sick sinus syndrome.   CURRENT MEDICATIONS:  1. Inderal 40 mg p.o. b.i.d.  2. Verapamil 120 mg p.o. q.8 h.  3. Xanax 0.5 at bedtime p.r.n.  4. Coumadin 5 mg p.o. daily.  5. Crestor 5 mg p.o. daily.  6. Benicar 40 mg p.o. daily.   PHYSICAL EXAMINATION:  VITALS:  Blood pressure is 138/84, temperature  was 103.1, respiratory rate is 20, and pulse rate is 110 in sinus.  EYES:   PERRLA.  Extraocular movements intact.  Sclerae clear.  Conjunctivae pink.  NECK:  No JVD, no carotid bruits, no thyromegaly, and no thyroid bruits.  LUNGS:  Showed prolonged expiratory phase.  No rales, wheezes, or  rhonchi appreciable.  No dullness to percussion.  HEART:  Regular regular and rhythm.  No murmurs, gallops, heaves,  thrills, or rubs.  ABDOMEN:  Soft and nontender.  Bowel sounds are normoactive.  No  guarding or rebound.  No masses.  No organomegaly.  EXTREMITIES:  No clubbing, cyanosis, or edema.   IMPRESSION:  1. Bilateral infiltrates, bronchopneumonia, and chronic bronchitis.  2. Suspected influenza.  3. History of paroxysmal atrial fibrillation, currently sinus rhythm.  4. Hypertension.  5. Hyperlipidemia.  6. Anxiety.   PLAN:  The plan is to continue Rocephin and Zithromax, obtain sputum  cultures.  She was placed empirically on Tamiflu and fluid precautions.  Decreased Cardizem drip from 15 to 5 per hour, and on verapamil 120 q.8  h., Inderal 40 b.i.d., and I will make further recommendations as the  database  expands.      Melvyn Novas, MD  Electronically Signed     RMD/MEDQ  D:  03/14/2008  T:  03/14/2008  Job:  [id number]

## 2010-06-11 NOTE — Assessment & Plan Note (Signed)
Her device is working normally today. We'll recheck in several months.

## 2010-06-11 NOTE — Progress Notes (Signed)
HPI Traci [name] returns today for followup. She has a history of symptomatic bradycardia status post pacemaker insertion. She has a history of persistent atrial fibrillation. She has had syncope which is thought to be related to carvedilol. She has had increasing fatigue weakness and shortness of breath. At rest she feels okay, but with significant activity she quickly becomes winded and short of breath and has to stop she is doing. Up until now, she has undergone a stretch of rate control for her age her for ablation though it is unclear to me whether her rate is actually well controlled or not. She does not have much noise palpitations. Allergies  Allergen Reactions  . Morphine      Current Outpatient Prescriptions  Medication Sig Dispense Refill  . ALPRAZolam (XANAX) 1 MG tablet Take 1 mg by mouth at bedtime as needed.        . Ascorbic Acid (VITAMIN C) 1000 MG tablet Take 1,000 mg by mouth 3 (three) times daily.       . Calcium Carbonate-Vit D-Min (CALCIUM 1200 PO) Take 1 capsule by mouth daily.        . COD LIVER OIL PO Take 1 tablet by mouth daily.        . digoxin (LANOXIN) 0.25 MG tablet Take 250 mcg by mouth daily.        Marland Kitchen LECITHIN PO Take by mouth as directed.        Marland Kitchen losartan (COZAAR) 50 MG tablet Take 50 mg by mouth daily.        . niacin 250 MG tablet Take 250 mg by mouth daily with breakfast.        . Omega-3 Fatty Acids (FISH OIL) 1000 MG CAPS Take 1 capsule by mouth daily.        . rosuvastatin (CRESTOR) 5 MG tablet Take 5 mg by mouth daily.        . verapamil (CALAN-SR) 120 MG CR tablet Take 120 mg by mouth 3 (three) times daily.       . vitamin A 29528 UNIT capsule Take 10,000 Units by mouth daily.        . vitamin E 400 UNIT capsule Take 400 Units by mouth daily.        Marland Kitchen warfarin (COUMADIN) 5 MG tablet Take 5 mg by mouth daily.        Marland Kitchen DISCONTD: carvedilol (COREG) 6.25 MG tablet Take 6.25 mg by mouth 2 (two) times daily with a meal.        . DISCONTD: LITHIUM CARBONATE  PO Take 1 tablet by mouth daily.           Past Medical History  Diagnosis Date  . Sick sinus syndrome     PAF in 8,9,10/2003,1/06;insignificant CAD in  08/2003  . Sick sinus syndrome     ddd pacing medtronic rhythm 05/2004   . Chronic anticoagulation     followed by PMD  . Hyperlipidemia   . Hypertension   . Cerebrovascular disease     right carotid endarectomy in 1999;negative duplex in 06/2004  . Pneumonia     bilateral pneumonia 2010  . Hyperglycemia     fasting    ROS:   All systems reviewed and negative except as noted in the HPI.   Past Surgical History  Procedure Date  . Appendectomy   . Abdominal hysterectomy   . Right carotid endarectomy     1999  . Pacemaker placement     2006  .  Colonoscopy     2006     No family history on file.   History   Social History  . Marital Status: Married    Spouse Name: N/A    Number of Children: N/A  . Years of Education: N/A   Occupational History  . Not on file.   Social History Main Topics  . Smoking status: Never Smoker   . Smokeless tobacco: Never Used  . Alcohol Use: No  . Drug Use: No  . Sexually Active: Not on file   Other Topics Concern  . Not on file   Social History Narrative  . No narrative on file     BP -144/90    P - 78     R - 18  Wt. 154 lbs  Physical Exam:  Well appearing NAD HEENT: Unremarkable Neck:  No JVD, no thyromegally Lymphatics:  No adenopathy Back:  No CVA tenderness Lungs:  Clear HEART:  Iregular rate rhythm, no murmurs, no rubs, no clicks Abd:  Flat, positive bowel sounds, no organomegally, no rebound, no guarding Ext:  2 plus pulses, no edema, no cyanosis, no clubbing Skin:  No rashes no nodules Neuro:  CN II through XII intact, motor grossly intact DEVICE  Normal device function.  See PaceArt for details.   Assess/Plan:

## 2010-06-11 NOTE — H&P (Signed)
NAME:  Traci [name], Traci [name] NO.:  [id number]   MEDICAL RECORD NO.:  [id number]          PATIENT TYPE:  INP   LOCATION:  A215                          FACILITY:  [facility]   PHYSICIAN:  Kingsley Callander. Ouida Sills, MD       DATE OF BIRTH:  [birth year]   DATE OF ADMISSION:  03/13/2008  DATE OF DISCHARGE:  LH                              HISTORY & PHYSICAL   CHIEF COMPLAINT:  Cough.   HISTORY OF PRESENT ILLNESS:  This patient is a 76 year old white female  who presented to the emergency room with cough for 4 days.  She had had  minimal purulent sputum production.  She had developed a fever to 101.  She had been seen by Dr. Janna Arch earlier in the day and had been  prescribed medications but could not name them.  She felt progressively  worse though and therefore came in for additional evaluation.  She is a  nonsmoker.  She does not have a history of chronic lung disease.  She  was found to be in a rapid atrial fibrillation with a heart rate into  the 140s.  Her chest x-ray was consistent with a bilateral pneumonia.   PAST MEDICAL HISTORY:  1. Atrial fibrillation.  2. Hypertension.  3. Hyperlipidemia.  4. Pacemaker.  5. Appendectomy.  6. Hysterectomy.  7. Right carotid endarterectomy in 1998.   MEDICATIONS:  Nitroglycerin, verapamil, Coumadin, Xanax, Benicar,  Inderal, multivitamin, vitamin C, vitamin E, Crestor 5 mg a day.   ALLERGIES:  MORPHINE WHICH RESULTED IN HALLUCINATIONS.   SOCIAL HISTORY:  She does not smoke, drink or use drugs.   REVIEW OF SYSTEMS:  Noncontributory.   PHYSICAL EXAMINATION:  Temperature 101.4, respirations 20, pulse 129,  blood pressure 133/96, oxygen saturation 97%.  GENERAL:  Alert and in no acute distress.  HEENT:  No scleral icterus.  Pharynx is moist.  NECK:  Reveals no JVD or thyromegaly.  LUNGS:  Bilateral rales.  HEART:  Tachycardic and irregularly irregular.  ABDOMEN:  Soft, nontender.  No hepatosplenomegaly.  EXTREMITIES:  Normal pulses.  No  cyanosis, clubbing or edema.  NEURO:  Grossly intact.  LYMPH NODE:  No cervical or supraclavicular enlargement.   LABORATORY DATA:  White count 8.2, hemoglobin 14.2, platelets 185,000.  INR 1.5.  Sodium 140, potassium 3.5, bicarb 29, glucose 100, BUN 9,  creatinine 0.73, calcium 8.7.  The chest x-ray reveals bilateral  infiltrates consistent with pneumonia.   IMPRESSION:  1. Community-acquired pneumonia.  She is febrile and tachycardic but      is oxygenating well.  She will require admission and treatment with      IV antibiotics.  Blood cultures are being obtained.  2. Rapid atrial fibrillation.  We will treat with IV diltiazem.  Will      continue propranolol at a dose of 40 mg t.i.d.  She is unclear of      her medication dosages.  Her anticoagulation is subtherapeutic.      Coumadin will be continued.  3. Hypertension.  Continue Benicar and Inderal.  4. Hyperlipidemia.  Continue Crestor.  5. Status post pacemaker placement.      Kingsley Callander. Ouida Sills, MD  Electronically Signed     ROF/MEDQ  D:  03/14/2008  T:  03/14/2008  Job:  [id number]

## 2010-06-11 NOTE — Letter (Signed)
Jun 12, 2006     RE:  Traci, [name]  MRN:  [id number]  /  DOB:  July 29, 1934   Melvyn Novas, MD  [address] [place] [address]   Dear Traci [name]:   Traci [name] returns to the office for continued assessment and treatment  of sick sinus syndrome with a history of paroxysmal atrial fibrillation  and a prior dual chamber pacemaker implantation. The pacemaker was last  checked telephonically approximately a week ago and was functioning  normally. There was a small amount of atrial fibrillation averaging  approximately 10 minutes per day. The patient reports intermittent  palpitations. She requested treatment with Inderal, which was started at  a dose of 10 mg b.i.d. This has returned her to an asymptomatic state.  Blood pressure control has been good. Lipids were obtained a few months  ago and were well controlled. She has had no further neurologic symptoms  following carotid endarterectomy in 1999. Anticoagulation has been  managed from your office.   CURRENT MEDICATIONS:  Current medications include Warfarin as directed,  a calcium supplement, a number of vitamins and neuropseudicals, Crestor  5 mg daily, Verapamil 120 mg t.i.d., Benicar 40 mg daily, Propranolol 10  mg b.i.d.   PHYSICAL EXAMINATION:  GENERAL:  A trim, pleasant woman in no acute  distress.  VITAL SIGNS:  Weight is 143, 12 pounds more than in May 2007. Blood  pressure 130/70, heart rate 66 and regular. Respiratory rate 16.  NECK:  No jugular venous distention; right carotid endarterectomy scar  without carotid bruits.  LUNGS:  Clear.  CARDIAC:  Normal first and second heart sounds; fourth heart sound  present; minimal basilar systolic ejection murmur.  ABDOMEN:  Soft, nontender; no masses; no organomegaly.  EXTREMITIES:  Distal pulses intact; no edema.   IMPRESSION:  Traci [name] is doing very well. Hypertension is well  controlled, as is hyperlipidemia. Sick sinus syndrome is not causing any  symptoms at present. She last underwent carotid ultrasound in June 2006  and can wait for another 6-12 months. I will have her return stool  Hemoccult cards for testing and plan to see this nice woman again in 1  year.    Sincerely,      Gerrit Friends. Dietrich Pates, MD, [facility]    RMR/MedQ  DD: 06/12/2006  DT: 06/12/2006  Job #: [id number]

## 2010-06-11 NOTE — Assessment & Plan Note (Signed)
[facility]                         ELECTROPHYSIOLOGY OFFICE NOTE   NAME:Traci [name], Dantuono                        MRN:          [id number]  DATE:06/14/2008                            DOB:          [birth year]    Ms. Kell returns today for followup.  She is a very pleasant woman  with hypertension and paroxysmal atrial fibrillation and nonobstructive  coronary artery disease.  She has symptomatic bradycardia and underwent  permanent pacemaker insertion many years ago.  She returns today for  followup.  She denies chest pain.  She denies shortness of breath.  She  has very rare palpitations.   MEDICATIONS:  1. Propranolol 10 mg twice a day.  2. Verapamil 120 mg 3 times a day.  3. Crestor 10 mg half a tablet daily.  4. Vitamin A.  5. Calcium supplement 100 mg 3 times daily.  6. Xanax 0.25 mg nightly p.r.n.   PHYSICAL EXAMINATION:  GENERAL:  She is pleasant, well-nourished.  VITAL SIGNS:  Blood pressure 150/100, the pulse 68 and regular,  respirations were 18, weight was 151 pounds.  NECK:  No jugular venous distention.  LUNGS:  Clear bilaterally to auscultation.  No wheezes, rales, or  rhonchi are present.  There is no increased work of breathing.  CARDIOVASCULAR:  Regular rate and rhythm.  Normal S1 and S2.  ABDOMEN:  Soft, nontender.  There is no organomegaly.  EXTREMITIES:  No edema.   Interrogation of her pacemaker was carried out today which demonstrates  a Medtronic in rhythm.  The P-waves measured 2, the R-waves were 7.  The  impedance 552 in the atrium and 448 in the ventricle.  The patient was  in atrial fibrillation approximately 25% of the time.   IMPRESSION:  1. Paroxysmal atrial fibrillation.  2. Symptomatic tachy-brady syndrome.  3. Hypertension.   DISCUSSION:  With regard to the hypertension, I have asked that she  continue her current medications and really work hard on a low-salt  diet.  I have asked that she start walking.   With regard to her  pacemaker, I have recommend that she continue on, as her pacemaker is  working normally.  With regard to her atrial fibrillation, the patient  is minimally symptomatic from this at  this time.  If her symptoms get worse then we might consider adding an  antiarrhythmic drug, but for now I think her symptoms do not justify the  need for this.     Doylene Canning. Ladona Ridgel, MD  Electronically Signed    GWT/MedQ  DD: 06/14/2008  DT: 06/15/2008  Job #: [id number]   cc:   Melvyn Novas, MD

## 2010-06-14 ENCOUNTER — Telehealth: Payer: Self-pay

## 2010-06-14 NOTE — Procedures (Signed)
NAME:  Traci [name], Traci [name]                           ACCOUNT NO.:  [id number]   MEDICAL RECORD NO.:  [id number]                   PATIENT TYPE:  INP   LOCATION:  6531                                 FACILITY:  [facility]   PHYSICIAN:  Edward L. Juanetta Gosling, M.D.             DATE OF BIRTH:  06/30/1934   DATE OF PROCEDURE:  09/08/2003  DATE OF DISCHARGE:                                EKG INTERPRETATION   RESULTS:  The rhythm is atrial fibrillation with a ventricular response  around 150.  There are Q-wave inferiorly which could indicate a previous  inferior myocardial infarction.  QT interval was difficult to determine, but  it appears to be prolonged and this could be due to electrolyte imbalance,  drug effect, or primary myocardial disease.  There are ST-T wave  abnormalities which are diffuse and could be due to ischemia.      ___________________________________________                                            Oneal Deputy. Juanetta Gosling, M.D.   ELH/MEDQ  D:  09/09/2003  T:  09/09/2003  Job:  [id number]

## 2010-06-14 NOTE — Procedures (Signed)
NAME:  Traci [name], Traci [name]                           ACCOUNT NO.:  [id number]   MEDICAL RECORD NO.:  [id number]                   PATIENT TYPE:  INP   LOCATION:  6531                                 FACILITY:  [facility]   PHYSICIAN:  Edward L. Juanetta Gosling, M.D.             DATE OF BIRTH:  [birth year]   DATE OF PROCEDURE:  09/08/2003  DATE OF DISCHARGE:                                EKG INTERPRETATION   RESULTS:  The rhythm is sinus rhythm with rate in the 50s.  There is left  atrial enlargement and LVH.  Q-waves are seen inferiorly, there is still  slight ST elevation in limb leads 3 and aVF.  Clinical correlation is  suggested, but this appears to be an old change.  Abnormal  electrocardiogram.      ___________________________________________                                            Oneal Deputy. Juanetta Gosling, M.D.   ELH/MEDQ  D:  09/09/2003  T:  09/09/2003  Job:  [id number]

## 2010-06-14 NOTE — Discharge Summary (Signed)
NAME:  Traci [name], Traci [name]                           ACCOUNT NO.:  [id number]   MEDICAL RECORD NO.:  [id number]                   PATIENT TYPE:  INP   LOCATION:  6531                                 FACILITY:  [facility]   PHYSICIAN:  Tereso Newcomer, P.A.                  DATE OF BIRTH:  1934-11-24   DATE OF ADMISSION:  09/08/2003  DATE OF DISCHARGE:  09/09/2003                                 DISCHARGE SUMMARY   ADDENDUM:  The patient tells me that she was on potassium supplementation at home. I  have asked her to continue that. Will still have her BMET checked when she  comes in on Tuesday, September 12, 2003, and then further therapy can be  adjusted after that. Also efforts will need to be made to educate the  patient on herbal supplementation and taking her medications correctly.                                                Tereso Newcomer, P.A.    SW/MEDQ  D:  09/09/2003  T:  09/09/2003  Job:  [id number]

## 2010-06-14 NOTE — Procedures (Signed)
NAME:  [name], [name]                           ACCOUNT NO.:  [id number]   MEDICAL RECORD NO.:  [id number]                   PATIENT TYPE:  INP   LOCATION:  6531                                 FACILITY:  [facility]   PHYSICIAN:  Edward L. Juanetta Gosling, M.D.             DATE OF BIRTH:  [birth year]   DATE OF PROCEDURE:  09/08/2003  DATE OF DISCHARGE:                                EKG INTERPRETATION   RESULTS:  The rhythm is atrial fibrillation.  The ventricular response now  has slowed to about 110.  There are Q-wave inferiorly suggestive of a  previous infarction.  QT interval is prolonged.  There are diffuse ST-T wave  abnormalities which are less suggestive of ischemia than previous.      ___________________________________________                                            Oneal Deputy Juanetta Gosling, M.D.   ELH/MEDQ  D:  09/09/2003  T:  09/09/2003  Job:  [id number]

## 2010-06-14 NOTE — Discharge Summary (Signed)
NAME:  Traci [name], Traci [name] NO.:  [id number]   MEDICAL RECORD NO.:  [id number]          PATIENT TYPE:  OIB   LOCATION:  4707                         FACILITY:  [facility]   PHYSICIAN:  Pricilla Riffle, M.D.    DATE OF BIRTH:  Jul 10, 1934   DATE OF ADMISSION:  06/20/2004  DATE OF DISCHARGE:  06/21/2004                                 DISCHARGE SUMMARY   CARDIOLOGIST:  [place] Bing, M.D.   PRIMARY CARE DOCTOR:  Milus Mallick. Lodema Hong, M.D.   DISCHARGING DIAGNOSIS:  Status post permanent pacer implantation secondary  to paroxysmal atrial fibrillation with sick sinus syndrome (tachy-brady).   MEDICAL HISTORY:  1.  Cerebrovascular accident.  2.  Status post right carotid endarterectomy.  3.  History of hyperlipidemia.  4.  Anticoagulation therapy with Coumadin secondary to atrial fibrillation.   DISPOSITION:  The patient is being discharged home on Jun 21, 2004 with  permanent pacemaker instructions, including activity, wound care, and follow-  up care.   MEDICATIONS:  1.  Digoxin 0.25 mg daily.  2.  Verapamil 120 mg t.i.d.  3.  Coumadin, as previously taken.   Wound check at the [facility] office June 27, 2004 at 2:00 p.m.  Dr. Dietrich Pates  on July 18, 2004 at 3:00 p.m.  Patient instructed to call the office for any  problems with pacer site, including redness, swelling, or fever.   HOSPITAL COURSE:  This is a very pleasant 76 year old female with history of  symptomatic tachy-brady syndrome, admitted this admission for permanent  pacemaker implantation.  Seen by Dr. Lewayne Bunting on Jun 20, 2004, with  implantation of a Medtronic dual-chamber pacemaker, model no. P1501DR,  serial no. [id number].  The patient tolerated the procedure without any  complications.  Monitored overnight.  On the day of admission,  patient afebrile, vital signs stable.  Seen by Dr. Dietrich Pates.  Chest x-ray  negative for pneumothorax.  The patient is being discharged home, with  follow-up as stated  above.  Patient instructed to keep her next Coumadin  clinic visit at [facility].      MB/MEDQ  D:  06/21/2004  T:  06/21/2004  Job:  [id number]   cc:   Milus Mallick. Lodema Hong, M.D.  [address]  [place], [place] [address]  Fax: [phone]

## 2010-06-14 NOTE — H&P (Signed)
NAME:  Traci [name], Traci [name] NO.:  [id number]   MEDICAL RECORD NO.:  [id number]          PATIENT TYPE:  OIB   LOCATION:  2899                         FACILITY:  [facility]   PHYSICIAN:  Doylene Canning. Ladona Ridgel, M.D.  DATE OF BIRTH:  28-Mar-1934   DATE OF ADMISSION:  06/20/2004  DATE OF DISCHARGE:                                HISTORY & PHYSICAL   ADMITTING DIAGNOSES:  Symptomatic tachybrady syndrome with admission for  permanent pacemaker implantation.   HISTORY OF PRESENT ILLNESS:  The patient is a very pleasant middle-aged  woman with a history of tachybrady syndrome and paroxysmal atrial  fibrillation.  She had symptomatic palpitations and documented atrial  fibrillation with rapid ventricular rates.  For this reason she has been  placed on combination of digoxin, verapamil, and metoprolol.  There has also  been significant sinus node dysfunction with resultant junctional rhythm.  The patient has had very slow chronotropic response.  She has been  documented to have heart rates of 60 beats per minute with a vigorous  exertion resulting in early termination of her exercise.  The patient denies  syncope.  She is now referred for pacemaker insertion.   PAST MEDICAL HISTORY:  1.  Right carotid endarterectomy in 1999.  2.  She has a history of dyslipidemia.  3.  She has a history of hypertension.   SOCIAL HISTORY:  The patient denies tobacco or ethanol use.  She is semi-  retired.  She is married and lives in [place].   FAMILY HISTORY:  Negative for coronary disease.   REVIEW OF SYSTEMS:  Negative except as noted in the HPI.  She has very mild  arthritis as well.   PHYSICAL EXAMINATION:  GENERAL:  Pleasant, well-appearing woman in no  distress.  VITAL SIGNS:  Blood pressure 180/70, pulse 65 and regular, respirations 18,  temperature 97, weight 143 pounds.  HEENT:  Normocephalic, atraumatic.  Pupils equal and round.  Oropharynx was  moist.  Sclerae were anicteric.  NECK:   No jugular venous distention.  There is no thyromegaly.  Trachea was  midline.  LUNGS:  Clear bilaterally to auscultation.  There are no wheezes, rales, or  rhonchi.  CARDIOVASCULAR:  Regular rate and rhythm with normal S1 and S2.  There are  no murmurs.  ABDOMEN:  Soft, nontender, nondistended.  There is no organomegaly.  EXTREMITIES:  Pulses were 2+ and symmetric.  There is no clubbing, cyanosis,  edema.  NEUROLOGIC:  Alert and oriented x3 with cranial nerves intact.  Strength was  5/5 and symmetric.   EKG demonstrates sinus bradycardia with intermittent junctional rhythm.   IMPRESSION:  1.  Symptomatic tachybrady syndrome.  2.  Paroxysmal atrial fibrillation.  3.  Hypertension.   DISCUSSION:  I discussed the treatment options with the patient.  The risks,  benefits, goals, and expectations of permanent pacemaker insertion has been  discussed with the patient and she wishes to proceed.      GWT/MEDQ  D:  06/20/2004  T:  06/20/2004  Job:  [id number]   cc:   [place] Bing, M.D.  Milus Mallick. Lodema Hong, M.D.  [address]  [place], [place] [address]  Fax: [phone]

## 2010-06-14 NOTE — Op Note (Signed)
NAME:  Traci [name], Traci [name] NO.:  [id number]   MEDICAL RECORD NO.:  [id number]          PATIENT TYPE:  OIB   LOCATION:  2899                         FACILITY:  [facility]   PHYSICIAN:  Doylene Canning. Ladona Ridgel, M.D.  DATE OF BIRTH:  [birth year]   DATE OF PROCEDURE:  06/20/2004  DATE OF DISCHARGE:                                 OPERATIVE REPORT   PROCEDURE PERFORMED:  Implantation of dual-chamber pacemaker.   INDICATIONS:  Symptomatic tachy-brady syndrome with paroxysmal atrial  fibrillation.   INTRODUCTION:  The patient is a very pleasant 76 year old woman with a  history of symptomatic bradycardia and paroxysmal atrial fibrillation. She  has chronotropic incompetence. She is now referred for permanent pacemaker  insertion.   PROCEDURE:  After informed consent was obtained, the patient was taken to  the diagnostic EP lab in fasting state. After usual preparation and draping,  intravenous fentanyl and midazolam was given for sedation. 30 mL of  lidocaine was infiltrated in the left infraclavicular region. A 5 cm  incision was carried out over this region. Electrocautery utilized to  dissect down the fascial plane. 10 mL of contrast was injected into the left  upper extremity venous system demonstrating a patent left subclavian vein.  It was subsequently punctured and the Medtronic model 5076 52-cm active  fixation pacing lead serial number [id number] was advanced into the right  ventricle and Medtronic model 5076 45-cm active fixation lead serial number  [id number] was advanced into the right atrium. Mapping was carried out with  the RV lead in the right ventricle. At the final site in the RV apex the R-  waves measured 10 mV and the pacing threshold 0.5 volts at 0.5 milliseconds  with pacing impedance of 194 ohms once lead was actively fixed. 10-volt  pacing at this location did not stimulate the diaphragm. With the  ventricular lead in satisfactory position, attention was then  turned to  placement of the atrial lead. It was placed in the right atrial appendage  where P-waves measured 1.7 mV and the pacing threshold was 1 volt at 0.5  milliseconds. The pacing impedance 629 ohms. 10-volt pacing did not  stimulate the diaphragm and the atrium. This was with lead actively fixed.  With both atrial and ventricular leads in satisfactory position they were  secured to the subpectoralis fascia with a figure-of-eight silk suture. In  addition the sew-in sleeve was secured with silk suture. At this point,  electrocautery was utilized to make subcutaneous pocket. Kanamycin  irrigation was utilized to irrigate the pocket. Electrocautery utilized to  assure hemostasis. The Medtronic Nrhythm model [id number] dual-chamber  pacemaker serial number [id number] was connected to the atrial and  ventricular pacing leads and placed in the subcutaneous pocket. Generator  secured with silk suture. Additional kanamycin utilized to irrigate the  pocket. The incision was then closed with layer of 2-0 Vicryl followed by  layer of 3-0 Vicryl followed by layer of 4-0 Vicryl. Benzoin was painted on  the skin, Steri-Strips were applied, and pressure dressing was placed. The  patient was returned to her room in  satisfactory condition.   COMPLICATIONS:  There were no immediate procedure complications.   RESULTS:  This demonstrates successful implantation of a Medtronic dual-  chamber pacemaker in the patient with symptomatic tachy-brady syndrome and  paroxysmal atrial fibrillation.      GWT/MEDQ  D:  06/20/2004  T:  06/20/2004  Job:  [id number]   cc:   [place] Bing, M.D.   Milus Mallick. Lodema Hong, M.D.  [address]  [place], [place] [address]  Fax: [phone]

## 2010-06-14 NOTE — Procedures (Signed)
NAME:  Traci [name], Traci [name]                           ACCOUNT NO.:  [id number]   MEDICAL RECORD NO.:  [id number]                   PATIENT TYPE:  INP   LOCATION:  6531                                 FACILITY:  [facility]   PHYSICIAN:  Edward L. Juanetta Gosling, M.D.             DATE OF BIRTH:  [birth year]   DATE OF PROCEDURE:  09/08/2003  DATE OF DISCHARGE:                                EKG INTERPRETATION   RESULTS:  The rhythm is sinus rhythm now with a rate in the 60s.  There is  left atrial enlargement.  Left ventricular hypertrophy is seen at least by  voltage criteria.  There are Q-wave inferiorly.  There is minimal ST  elevation inferiorly in limb leads 3 and aVF.  Abnormal electrocardiogram.      ___________________________________________                                            Oneal Deputy. Juanetta Gosling, M.D.   ELH/MEDQ  D:  09/09/2003  T:  09/09/2003  Job:  [id number]

## 2010-06-14 NOTE — Procedures (Signed)
NAME:  YENG, FRANKIE NO.:  [id number]   MEDICAL RECORD NO.:  [id number]          PATIENT TYPE:  OUT   LOCATION:  RAD                           FACILITY:  [facility]   PHYSICIAN:  Navarino Bing, M.D.  DATE OF BIRTH:  [birth year]   DATE OF PROCEDURE:  06/06/2004  DATE OF DISCHARGE:                                  ECHOCARDIOGRAM   REFERRING PHYSICIAN:  Dr. Lodema Hong and Dr. Dietrich Pates.   CLINICAL DATA:  A 76 year old woman with paroxysmal atrial fibrillation.   Aorta 2.6, left atrium 3.6, septum 1.8, posterior wall 1.3, LV diastole 4.0,  LV systole 2.2.   1.  Technically adequate echocardiographic study.  2.  Very mild left and right atrial enlargement; normal right ventricular      size and function.  3.  Mild aortic valvular sclerosis with normal function.  4.  Normal aortic root diameter; mild calcification of the proximal      ascending aortic wall.  5.  Normal mitral valve; mild annular calcification; minimal regurgitation.  6.  Normal tricuspid valve.  7.  Normal left ventricular size; moderate hypertrophy with disproportionate      septal thickening. Hyperdynamic regional and global function.  8.  Very small posterior pericardial effusion.  9.  Normal IVC.      RR/MEDQ  D:  06/06/2004  T:  06/07/2004  Job:  [id number]
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
## 2010-06-14 NOTE — Discharge Summary (Signed)
NAME:  Traci [name], Traci [name]                           ACCOUNT NO.:  [id number]   MEDICAL RECORD NO.:  [id number]                   PATIENT TYPE:  INP   LOCATION:  6531                                 FACILITY:  [facility]   PHYSICIAN:  Tereso Newcomer, P.A.                  DATE OF BIRTH:  [birth year]   DATE OF ADMISSION:  09/08/2003  DATE OF DISCHARGE:  09/09/2003                                 DISCHARGE SUMMARY   DISCHARGE DIAGNOSES:  1. Paroxysmal atrial fibrillation with rapid ventricular rate with     spontaneous conversion to normal sinus rhythm.     A. Long-term Coumadin therapy initiated this admission.     B. Mild troponin increase in the setting of rapid ventricular rate.     C. Mild coronary plaque by catheterization this admission.  2. Good left ventricular function with an ejection fraction of 75%.  3. Hypertension.  4. Treated dyslipidemia.     A. Lipitor initiated this admission.  5. Peripheral vascular disease.     A. History of left carotid endarterectomy.  6. Hypokalemia.   PROCEDURE PERFORMED THIS ADMISSION:  Cardiac catheterization by Dr. Rollene Rotunda. Please see that dictated note for complete details. This revealed  mild coronary plaque and an EF of 75% with hyperdynamic LV.   HOSPITAL COURSE:  Please see the dictate admission history and physical from  Dr. Dorethea Clan for complete details.  Briefly, this is a 76 year old female  patient who presented with chest discomfort on September 08, 2003, at [facility]. He was found to be in atrial fibrillation with a rapid  ventricular rate. She spontaneously converted to normal sinus rhythm. She  was transferred to [facility] for further evaluation and treatment.  She was maintained on heparin therapy. Her troponin-I was elevated somewhat  at 1.19. Given the risk factors for coronary disease and presentation, it  was decided that she should go for cardiac catheterization to further  assess. Results are noted above.   She remained in sinus rhythm with brief  runs of atrial fibrillation with aberrancy. On the morning of September 09, 2003, she was evaluated by Dr. Diona Browner who felt she was stable enough for  discharge to home. Her INR was 1.2. She would need to remain on Coumadin  therapy long-term. Will have her follow up in our [place] office early  next week for check of her INR. Thyroid panel is pending at the time of this  dictation.   The patient did have noted hyperkalemia during her admission. She was  maintained on potassium supplementation. At discharge this was discontinued  and she was asked to increase potassium containing foods in her diet. She  will have a BMET drawn at the time of her INR check on Tuesday, August 16th.  If this is low again, then she will need to be placed back on replacement  therapy.  LABORATORY DATA:  At discharge white count 8600, hemoglobin 11.3, hematocrit  32.7, platelet count 205,000.  INR 1.2. Sodium 141, potassium 2.6, chloride  110, CO2 28, glucose 88, BUN 12, creatinine 0.7, total bilirubin 0.6,  alkaline phosphatase 36, AST 20, ALT 14, total protein 5.5, albumin 3.1,  calcium 8.4. CK-MB #1 4.2, #2 13.7, #3 8.0, #4 6.4. Total CK #1 63, #2 99,  #3 67, #4 64. Troponin-I #1 0.07, #2 1.19, #3 0.63, #4 0.56.  BNP 244.  Thyroid panel pending as noted above.   Chest x-ray reveals cardiomegaly with mild bronchitic changes.   DISCHARGE MEDICATIONS:  1. Aspirin 81 mg daily.  2. Coumadin 5 mg daily.  3. Lopressor 25 mg b.i.d.  4. Lipitor 10 mg q.h.s.  5. Nitroglycerin p.r.n. chest pain.   PAIN MANAGEMENT:  Tylenol as needed. She has been given nitroglycerin should  she have any chest pain in the future. She should call 9-1-1 or our office  for recurrent chest pain activity.   ACTIVITY:  No driving, lifting, or exertional work for three days.   DIET:  Low fat, low sodium, limited caffeine diet.   WOUND CARE:  The patient is to call our office for any groin  swelling,  bleeding, or bruising.   FOLLOWUP:  1. She will have PT-INR as well as BMET checked at our office in [place]     on Tuesday, August 16th.  2. She will see Dr. Dorethea Clan or the PA in two weeks at our [facility] office     and she will be contacted by our office to arrange that appointment.  3. She has been advised to eat a banana every day to help replace her     potassium. If her potassium is low on her repeat BMET on September 12, 2003,     she will need to have potassium supplementation therapy re-initiated.  4. She should follow up with Dr. Gerda Diss as needed.                                                Tereso Newcomer, P.A.    SW/MEDQ  D:  09/09/2003  T:  09/09/2003  Job:  [id number]   cc:   Lorin Picket A. Gerda Diss, M.D.  [address], [address]  [place]  [place] [address]  Fax: [phone]

## 2010-06-14 NOTE — Op Note (Signed)
NAME:  Traci [name], Traci [name] NO.:  [id number]   MEDICAL RECORD NO.:  [id number]          PATIENT TYPE:  AMB   LOCATION:  DAY                           FACILITY:  [facility]   PHYSICIAN:  R. Roetta Sessions, M.D. DATE OF BIRTH:  1934/10/09   DATE OF PROCEDURE:  09/18/2004  DATE OF DISCHARGE:                                 OPERATIVE REPORT   PROCEDURE:  Colonoscopy, screening.   INDICATIONS FOR PROCEDURE:  The patient is a 76 year old lady referred by  Dr. Nobie Putnam for colorectal cancer screening. She may have a scant amount of  blood per rectum when she strains. She is on Coumadin. Otherwise, has no  bowel symptoms. No family history colorectal neoplasia. She has never had a  colonoscopy. Colonoscopy is now being done. This approach has been discussed  with the patient at length. Potential risks, benefits, and alternatives have  been reviewed and questions answered. She is agreeable. Please see  documentation in the medical record.   PROCEDURE NOTE:  O2 saturation, blood pressure, pulse, and respirations were  monitored throughout the entire procedure. Conscious sedation with Versed 5  mg IV and Demerol 100 mg IV in divided doses.   INSTRUMENT:  Olympus video chip system.   FINDINGS:  Digital rectal exam revealed no abnormalities.   ENDOSCOPIC FINDINGS:  Prep was good.   Rectum:  Examination of the rectal mucosa including retroflexed view of the  anal verge revealed prominent internal hemorrhoidal plexus. Otherwise rectal  mucosa appeared normal.   Colon:  Colonic mucosa was surveyed from the rectosigmoid junction through  the left, transverse, and right colon to the area of the appendiceal  orifice, ileocecal valve, and cecum. These structures were well seen and  photographed for the record. From this level, the scope was slowly  withdrawn, and all previously mentioned mucosal surfaces were again seen.  The colonic mucosa appeared normal. The patient tolerated the  procedure well  and was reactive to endoscopy.   IMPRESSION:  Prominent internal hemorrhoidal plexus. Otherwise normal  rectum, normal colon.   RECOMMENDATIONS:  1.  Hemorrhoid literature provided to Ms. Olexa.  2.  Ten-day course of Anusol suppositories 1 per rectum at bedtime.  3.  The patient is to resume Coumadin today.  4.  Repeat screening colonoscopy in 10 years.      Jonathon Bellows, M.D.  Electronically Signed     RMR/MEDQ  D:  09/18/2004  T:  09/18/2004  Job:  [id number]   cc:   Patrica Duel, M.D.  [address], [address]  [place]  [place] [address]  Fax: [phone]

## 2010-06-14 NOTE — Cardiovascular Report (Signed)
NAME:  Traci [name], Traci [name]                           ACCOUNT NO.:  [id number]   MEDICAL RECORD NO.:  [id number]                   PATIENT TYPE:  INP   LOCATION:  6531                                 FACILITY:  [facility]   PHYSICIAN:  Rollene Rotunda, M.D.                DATE OF BIRTH:  07/15/1934   DATE OF PROCEDURE:  09/08/2003  DATE OF DISCHARGE:  09/09/2003                              CARDIAC CATHETERIZATION   PROCEDURES:  Left heart catheterization/coronary arteriography.   INDICATION:  Evaluate patient with chest pain and EKG changes in the face of  atrial fibrillation with rapid ventricular response.  She has cardiovascular  risk factors and a previous history of carotid endarterectomy.   CARDIOLOGIST:  Rollene Rotunda, M.D.   DESCRIPTION OF PROCEDURE:  Left heart catheterization was performed via the  right femoral artery.  The artery was cannulated using anterior wall  puncture.  A #6-French arterial sheath was inserted via the modified  Seldinger technique.  Preformed Judkins and pigtail catheter were utilized.  The patient tolerated the procedure well and left the laboratory in stable  condition.   RESULTS:   HEMODYNAMIC DATA:  LV 147/14, aorta 146/99.   CORONARIES:  1. Left main was long and normal.  2. The LAD had mid 30% stenosis.  There was a moderate size mid diagonal     which has ostial 25% stenosis.  3. The circumflex was a small vessel, essentially consisting of a moderate     size obtuse marginal which was normal.  4. The right coronary artery was a very large dominant vessel with ostial     25% stenosis with catheter-induced spasm.  There was a large PDA and     large posterolateral which were normal.  5. Left ventriculogram:  The left ventriculogram was hyperdynamic with an EF     of 75%.  There were no wall motion abnormalities.   CONCLUSION:  1. Mild coronary plaque.  2. Hyperdynamic left ventricle.   PLAN:  The patient will have Coumadin and beta blocker as  tolerated for her  atrial fibrillation.  This will be followed by Dr. Lilyan Punt.  She will  have continued aggressive risk reduction given her peripheral vascular  disease and nonobstructive LAD stenosis.                                               Rollene Rotunda, M.D.    JH/MEDQ  D:  09/08/2003  T:  09/10/2003  Job:  [id number]   cc:   Lorin Picket A. Gerda Diss, M.D.  [address]  [place]  [place] [address]  Fax: [phone]   Vida Roller, M.D.  Fax: [phone]

## 2010-06-14 NOTE — Assessment & Plan Note (Signed)
[facility]                           ELECTROPHYSIOLOGY OFFICE NOTE   NAME:SARTINAzuri, Bozard                        MRN:          [id number]  DATE:12/04/2005                            DOB:          11/13/1934    Ms. Stryker was seen today in the [place] clinic on December 04, 2005 for  followup of her Medtronic model #[id number] EnRhythm. Date of implant was Jun 21, 2004 for sick sinus syndrome.   On interrogation of her device today, battery voltage was 3.00, P waves  measured 1.8 millivolts with an atrial lead impedance of 544 ohms and an  atrial capture threshold of 0.5 volts at 0.4 msec. R waves measured 9.8  millivolts with a ventricular pacing threshold of 0.5 volts at 0.4  milliseconds and a ventricular lead impedance of 480 ohms. No changes were  made in her parameters today. She was enrolled in [facility] and will send a  transmission in at 53, 6 and 9 months' time with a return office visit in 1  year.      [name], LPN  Electronically Signed      Doylene Canning. Ladona Ridgel, MD  Electronically Signed   PO/MedQ  DD: 12/04/2005  DT: 12/05/2005  Job #: [id number]   cc:   Melvyn Novas, MD

## 2010-06-14 NOTE — Procedures (Signed)
NAME:  Traci [name], Traci [name]                           ACCOUNT NO.:  [id number]   MEDICAL RECORD NO.:  [id number]                   PATIENT TYPE:  INP   LOCATION:  6531                                 FACILITY:  [facility]   PHYSICIAN:  Edward L. Juanetta Gosling, M.D.             DATE OF BIRTH:  [birth year]   DATE OF PROCEDURE:  09/08/2003  DATE OF DISCHARGE:                                EKG INTERPRETATION   RESULTS:  The rhythm is sinus rhythm with a rate in the 50s.  There is left  atrial enlargement.  There is probable left ventricular hypertrophy with a  strain pattern.  Q-waves are seen inferiorly and there is minimal ST  elevation inferiorly.  Abnormal electrocardiogram.      ___________________________________________                                            Oneal Deputy. Juanetta Gosling, M.D.   ELH/MEDQ  D:  09/09/2003  T:  09/09/2003  Job:  [id number]

## 2010-06-17 ENCOUNTER — Telehealth: Payer: Self-pay

## 2010-06-18 ENCOUNTER — Telehealth: Payer: Self-pay

## 2010-06-18 NOTE — Telephone Encounter (Signed)
Patient will have monitor done in [place], I called and talk to Tammy about this patient nedd a 48 hrs Holter and she said she will call patient .

## 2010-06-18 NOTE — Telephone Encounter (Signed)
Tammy in [place] will call patient for 48 hrs Holter monitor

## 2010-06-26 ENCOUNTER — Ambulatory Visit (HOSPITAL_COMMUNITY)
Admission: RE | Admit: 2010-06-26 | Discharge: 2010-06-26 | Disposition: A | Discharge status: Home / Self Care | Payer: Medicare Other | Source: Ambulatory Visit | Attending: Internal Medicine | Admitting: Internal Medicine

## 2010-06-26 DIAGNOSIS — I4891 Unspecified atrial fibrillation: Secondary | ICD-10-CM

## 2010-06-26 DIAGNOSIS — R55 Syncope and collapse: Secondary | ICD-10-CM | POA: Insufficient documentation

## 2010-06-26 DIAGNOSIS — I10 Essential (primary) hypertension: Secondary | ICD-10-CM | POA: Insufficient documentation

## 2010-06-26 DIAGNOSIS — R002 Palpitations: Secondary | ICD-10-CM | POA: Insufficient documentation

## 2010-07-15 ENCOUNTER — Encounter: Payer: Self-pay | Admitting: Cardiology

## 2010-07-17 ENCOUNTER — Ambulatory Visit (INDEPENDENT_AMBULATORY_CARE_PROVIDER_SITE_OTHER): Payer: Medicare Other | Admitting: Internal Medicine

## 2010-07-17 ENCOUNTER — Encounter: Payer: Self-pay | Admitting: Internal Medicine

## 2010-07-17 DIAGNOSIS — I4891 Unspecified atrial fibrillation: Secondary | ICD-10-CM

## 2010-07-17 DIAGNOSIS — Z950 Presence of cardiac pacemaker: Secondary | ICD-10-CM

## 2010-07-17 DIAGNOSIS — I495 Sick sinus syndrome: Secondary | ICD-10-CM

## 2010-07-17 DIAGNOSIS — R5383 Other fatigue: Secondary | ICD-10-CM

## 2010-07-17 DIAGNOSIS — R5381 Other malaise: Secondary | ICD-10-CM

## 2010-07-17 LAB — POCT INR: INR: 3.2

## 2010-07-17 NOTE — Progress Notes (Signed)
HPI [name] returns today for followup. He has a history of atrial fibrillation, symptomatic tachybradycardia syndrome, status post permanent pacemaker insertion. The patient continues to be bothered her atrial fibrillation. I was initially concerned that her ventricular rates were very fast although she is on a cardiac monitor and this has not been demonstrated. The patient is been on multiple cardiac medications in the past but does not like taking her medicines in general. At rest she feels well. With any exertion however she quickly get short of breath and fatigue and has to stop what she is doing. She has had no syncope. Allergies  Allergen Reactions  . Morphine      Current Outpatient Prescriptions  Medication Sig Dispense Refill  . ALPRAZolam (XANAX) 1 MG tablet Take 1 mg by mouth at bedtime as needed.        . Ascorbic Acid (VITAMIN C) 1000 MG tablet Take 1,000 mg by mouth 3 (three) times daily.       . Calcium Carbonate-Vit D-Min (CALCIUM 1200 PO) Take 1 capsule by mouth daily.        . COD LIVER OIL PO Take 1 tablet by mouth daily.        . digoxin (LANOXIN) 0.25 MG tablet Take 250 mcg by mouth daily.        Marland Kitchen LECITHIN PO Take by mouth as directed.        Marland Kitchen losartan (COZAAR) 50 MG tablet Take 50 mg by mouth daily.        . niacin 250 MG tablet Take 250 mg by mouth daily with breakfast.        . Omega-3 Fatty Acids (FISH OIL) 1000 MG CAPS Take 1 capsule by mouth daily.        . rosuvastatin (CRESTOR) 5 MG tablet Take 5 mg by mouth daily.        . verapamil (CALAN-SR) 120 MG CR tablet Take 120 mg by mouth 3 (three) times daily.       Marland Kitchen warfarin (COUMADIN) 5 MG tablet Take 5 mg by mouth daily.        Marland Kitchen DISCONTD: vitamin A 21308 UNIT capsule Take 10,000 Units by mouth daily.        Marland Kitchen DISCONTD: vitamin E 400 UNIT capsule Take 400 Units by mouth daily.           Past Medical History  Diagnosis Date  . Sick sinus syndrome     PAF in 8,9,10/2003,1/06;insignificant CAD in  08/2003    . Sick sinus syndrome     ddd pacing medtronic rhythm 05/2004   . Chronic anticoagulation     followed by PMD  . Hyperlipidemia   . Hypertension   . Cerebrovascular disease     right carotid endarectomy in 1999;negative duplex in 06/2004  . Pneumonia     bilateral pneumonia 2010  . Hyperglycemia     fasting    ROS:   All systems reviewed and negative except as noted in the HPI.   Past Surgical History  Procedure Date  . Appendectomy   . Abdominal hysterectomy   . Right carotid endarectomy     1999  . Pacemaker placement     2006  . Colonoscopy     2006     No family history on file.   History   Social History  . Marital Status: Married    Spouse Name: N/A    Number of Children: N/A  . Years of Education: N/A  Occupational History  . Not on file.   Social History Main Topics  . Smoking status: Never Smoker   . Smokeless tobacco: Never Used  . Alcohol Use: No  . Drug Use: No  . Sexually Active: Not on file   Other Topics Concern  . Not on file   Social History Narrative  . No narrative on file     BP 143/88  Pulse 79  Resp 16  Ht 5\' 5"  (1.651 m)  Wt 153 lb (69.4 kg)  BMI 25.46 kg/m2  Physical Exam:  Well appearing NAD HEENT: Unremarkable Neck:  No JVD, no thyromegally Lymphatics:  No adenopathy Back:  No CVA tenderness Lungs:  Clear HEART:  IRegular rate rhythm, no murmurs, no rubs, no clicks Abd:  Flat, positive bowel sounds, no organomegally, no rebound, no guarding Ext:  2 plus pulses, no edema, no cyanosis, no clubbing Skin:  No rashes no nodules Neuro:  CN II through XII intact, motor grossly intact  DEVICE  Normal device function.  See PaceArt for details.   Assess/Plan:

## 2010-07-17 NOTE — Assessment & Plan Note (Signed)
I suspect much of her symptoms are related to her atrial fib. Hopefully with restoration of sinus rhythm, she will feel better.

## 2010-07-17 NOTE — Patient Instructions (Signed)
Will try and get patient in early next week for Tikosyn load  Will need to get last INR from Dr Mcgoo([facility])

## 2010-07-17 NOTE — Assessment & Plan Note (Signed)
She continues to be symptomatic. Her medical options are limited. She has not done well with rate control. After discussing the various treatment options I recommended that we plan to admit her to the hospital once her INR has been therapeutic for 4 weeks and start taking tikosyn twice daily. I discussed the pros and cons of this approach with the patient and she would like to proceed.

## 2010-07-17 NOTE — Assessment & Plan Note (Signed)
Her device is working normally. We'll recheck in several months. 

## 2010-07-18 ENCOUNTER — Telehealth: Payer: Self-pay | Admitting: Internal Medicine

## 2010-07-18 NOTE — Telephone Encounter (Signed)
Spoke w/pt advised I do not see any mention of stopping Losartan.  She seems confused on some things with her INR readings but have called and spoke w/Dr McGough's office they state she has been coming every month since Jan for INRs, she will fax me those readings, will leave for [facility].  Advised her Tresa Endo will be calling her tom to sch her admittance to hospital for Tikosyn

## 2010-07-18 NOTE — Telephone Encounter (Signed)
Pt rtn call regarding labwork and you can reach her at home number

## 2010-07-18 NOTE — Telephone Encounter (Signed)
Pt was in yesterday and dr taylor mentioned d/c losartin but it was not discussed again and she wanted to confirm she's to stop taking it

## 2010-07-23 ENCOUNTER — Ambulatory Visit (INDEPENDENT_AMBULATORY_CARE_PROVIDER_SITE_OTHER): Payer: Medicare Other

## 2010-07-23 VITALS — BP 136/82 | Ht 65.0 in | Wt 153.0 lb

## 2010-07-23 DIAGNOSIS — I4891 Unspecified atrial fibrillation: Secondary | ICD-10-CM

## 2010-07-23 LAB — BASIC METABOLIC PANEL
CO2: 28 mEq/L (ref 19–32)
Calcium: 9.5 mg/dL (ref 8.4–10.5)
Creatinine, Ser: 0.9 mg/dL (ref 0.4–1.2)

## 2010-07-23 LAB — MAGNESIUM: Magnesium: 2.2 mg/dL (ref 1.5–2.5)

## 2010-07-23 NOTE — Progress Notes (Signed)
HPI  Pt seen today for Tikosyn admission.  He has a history of atrial fibrillation and per Dr. Lubertha Basque OV last week, pt is becoming SOB and getting fatigued with exertion.  Reviewed medications with pt.  She is currently on no medications that may prolong QTc and has not been on any other antiarrhythmics recently.  Discussed side effects of medications with pt and importance of compliance.  She is aware it is important not to miss a dose and to call if she misses 2 doses.  She has already contacted her pharmacy and it will cost her ~$250 every 3 months for Tikosyn.  She is willing to pay for the medication.    Pt has INR monitored at [facility] in [place], [place].  Reviewed INRs over the past 6 months.  All have been >2.0.  INR today in office was 3.6.  She takes 5mg  daily except 2.5mg  on Saturday, Sunday and Monday.  Pt sent to lab to check potassium, magnesium, SCr, and dig level. EKG on 6/20 showed QTc of 403.  Pt does have pacemaker.   Current Outpatient Prescriptions  Medication Sig Dispense Refill  . ALPRAZolam (XANAX) 1 MG tablet Take 0.5 mg by mouth at bedtime as needed.       . Ascorbic Acid (VITAMIN C) 1000 MG tablet Take 1,000 mg by mouth 2 (two) times daily.       . Calcium Carbonate-Vit D-Min (CALCIUM 1200 PO) Take 1 capsule by mouth 2 (two) times daily.       . COD LIVER OIL PO Take 1 tablet by mouth daily.        . digoxin (LANOXIN) 0.125 MG tablet Take 125 mcg by mouth daily.        Marland Kitchen losartan (COZAAR) 50 MG tablet Take 50 mg by mouth daily.        . niacin 250 MG tablet Take 250 mg by mouth daily with breakfast.        . Omega-3 Fatty Acids (FISH OIL) 1000 MG CAPS Take 1 capsule by mouth daily.        . rosuvastatin (CRESTOR) 5 MG tablet Take 5 mg by mouth daily.        . verapamil (CALAN-SR) 120 MG CR tablet Take 120 mg by mouth 3 (three) times daily.       . vitamin E 400 UNIT capsule Take 400 Units by mouth daily.        Marland Kitchen warfarin (COUMADIN) 5 MG tablet Take 5  mg by mouth daily.        Marland Kitchen LECITHIN PO Take by mouth as directed.          Allergies  Allergen Reactions  . Morphine Nausea Only

## 2010-07-24 ENCOUNTER — Telehealth: Payer: Self-pay | Admitting: Internal Medicine

## 2010-07-24 LAB — BASIC METABOLIC PANEL
Calcium: 9.1 mg/dL (ref 8.4–10.5)
Glucose, Bld: 98 mg/dL (ref 70–99)
Potassium: 4.1 mEq/L (ref 3.5–5.3)
Sodium: 143 mEq/L (ref 135–145)

## 2010-07-24 NOTE — Telephone Encounter (Signed)
Patient had  monitor done in [place]

## 2010-07-24 NOTE — Telephone Encounter (Signed)
Per pt daughter calling, unsure of who called, possibly to report test results? Contacted [place] office they did not contact pt. Advised pt daughter we will send message to nurse.

## 2010-07-25 ENCOUNTER — Inpatient Hospital Stay (HOSPITAL_COMMUNITY)
Admission: AD | Admit: 2010-07-25 | Discharge: 2010-07-30 | DRG: 309 | Disposition: A | Discharge status: Home / Self Care | Payer: Medicare Other | Source: Ambulatory Visit | Attending: Internal Medicine | Admitting: Internal Medicine

## 2010-07-25 ENCOUNTER — Encounter: Payer: Self-pay | Admitting: *Deleted

## 2010-07-25 ENCOUNTER — Emergency Department (HOSPITAL_COMMUNITY)
Admission: EM | Admit: 2010-07-25 | Discharge: 2010-07-25 | Disposition: A | Discharge status: Home / Self Care | Payer: Medicare Other | Source: Home / Self Care | Attending: Emergency Medicine | Admitting: Emergency Medicine

## 2010-07-25 ENCOUNTER — Emergency Department (HOSPITAL_COMMUNITY): Payer: Medicare Other

## 2010-07-25 DIAGNOSIS — W208XXA Other cause of strike by thrown, projected or falling object, initial encounter: Secondary | ICD-10-CM | POA: Diagnosis present

## 2010-07-25 DIAGNOSIS — E785 Hyperlipidemia, unspecified: Secondary | ICD-10-CM | POA: Diagnosis present

## 2010-07-25 DIAGNOSIS — K5900 Constipation, unspecified: Secondary | ICD-10-CM | POA: Diagnosis not present

## 2010-07-25 DIAGNOSIS — Z950 Presence of cardiac pacemaker: Secondary | ICD-10-CM | POA: Insufficient documentation

## 2010-07-25 DIAGNOSIS — Z7901 Long term (current) use of anticoagulants: Secondary | ICD-10-CM

## 2010-07-25 DIAGNOSIS — S9030XA Contusion of unspecified foot, initial encounter: Secondary | ICD-10-CM | POA: Diagnosis present

## 2010-07-25 DIAGNOSIS — E7800 Pure hypercholesterolemia, unspecified: Secondary | ICD-10-CM | POA: Diagnosis present

## 2010-07-25 DIAGNOSIS — I4891 Unspecified atrial fibrillation: Principal | ICD-10-CM | POA: Diagnosis present

## 2010-07-25 DIAGNOSIS — J9819 Other pulmonary collapse: Secondary | ICD-10-CM | POA: Diagnosis present

## 2010-07-25 DIAGNOSIS — Z79899 Other long term (current) drug therapy: Secondary | ICD-10-CM

## 2010-07-25 DIAGNOSIS — M79609 Pain in unspecified limb: Secondary | ICD-10-CM | POA: Insufficient documentation

## 2010-07-25 DIAGNOSIS — I2510 Atherosclerotic heart disease of native coronary artery without angina pectoris: Secondary | ICD-10-CM | POA: Diagnosis present

## 2010-07-25 DIAGNOSIS — I672 Cerebral atherosclerosis: Secondary | ICD-10-CM | POA: Diagnosis present

## 2010-07-25 DIAGNOSIS — I10 Essential (primary) hypertension: Secondary | ICD-10-CM | POA: Diagnosis present

## 2010-07-25 DIAGNOSIS — X58XXXA Exposure to other specified factors, initial encounter: Secondary | ICD-10-CM

## 2010-07-25 DIAGNOSIS — I4892 Unspecified atrial flutter: Secondary | ICD-10-CM | POA: Diagnosis present

## 2010-07-25 LAB — PROTIME-INR
INR: 3.23 — ABNORMAL HIGH (ref 0.00–1.49)
Prothrombin Time: 33.5 seconds — ABNORMAL HIGH (ref 11.6–15.2)

## 2010-07-25 LAB — DIGOXIN LEVEL: Digoxin Level: 0.7 ng/mL — ABNORMAL LOW (ref 0.8–2.0)

## 2010-07-25 LAB — BASIC METABOLIC PANEL
BUN: 12 mg/dL (ref 6–23)
GFR calc Af Amer: >60 mL/min (ref >60)
GFR calc non Af Amer: >60 mL/min (ref >60)
Potassium: 3.9 mEq/L (ref 3.5–5.1)
Sodium: 141 mEq/L (ref 135–145)

## 2010-07-25 LAB — MAGNESIUM: Magnesium: 2.1 mg/dL (ref 1.5–2.5)

## 2010-07-25 NOTE — Telephone Encounter (Signed)
Spoke with pt yesterday.  Lab results were not available.  She was aware it would be today.  Labs were reviewed and pt was ready for admission.  She will be admitted to 3731 and she is aware.

## 2010-07-25 NOTE — Assessment & Plan Note (Signed)
Reviewed pt's labwork.  On 6/26, K- 3.7, SCr- 0.9, Mg- 2.2 and INR 3.6.  Dig level is still pending.  Pt was instructed to take of K on 6/26 and 20 mEq of K on 6/27 prior to repeat bloodwork.  On 6/27, pt's K was 4.1 so she is ready for Tikosyn start.  Lab was not received until after working hours on 6/27 so pt will be admitted on 6/28.

## 2010-07-26 DIAGNOSIS — I4891 Unspecified atrial fibrillation: Secondary | ICD-10-CM

## 2010-07-26 LAB — BASIC METABOLIC PANEL
CO2: 29 mEq/L (ref 19–32)
Calcium: 8.8 mg/dL (ref 8.4–10.5)
Creatinine, Ser: 0.78 mg/dL (ref 0.50–1.10)
GFR calc non Af Amer: >60 mL/min (ref >60)
Sodium: 142 mEq/L (ref 135–145)

## 2010-07-26 LAB — PROTIME-INR
INR: 3.07 — ABNORMAL HIGH (ref 0.00–1.49)
Prothrombin Time: 32.2 seconds — ABNORMAL HIGH (ref 11.6–15.2)

## 2010-07-27 LAB — BASIC METABOLIC PANEL
BUN: 19 mg/dL (ref 6–23)
Calcium: 8.6 mg/dL (ref 8.4–10.5)
Chloride: 103 mEq/L (ref 96–112)
Creatinine, Ser: 0.86 mg/dL (ref 0.50–1.10)
GFR calc Af Amer: >60 mL/min (ref >60)

## 2010-07-27 LAB — PROTIME-INR
INR: 2.29 — ABNORMAL HIGH (ref 0.00–1.49)
Prothrombin Time: 25.6 seconds — ABNORMAL HIGH (ref 11.6–15.2)

## 2010-07-28 LAB — BASIC METABOLIC PANEL
BUN: 18 mg/dL (ref 6–23)
Chloride: 101 mEq/L (ref 96–112)
Creatinine, Ser: 0.81 mg/dL (ref 0.50–1.10)
GFR calc non Af Amer: >60 mL/min (ref >60)
Glucose, Bld: 133 mg/dL — ABNORMAL HIGH (ref 70–99)
Potassium: 4 mEq/L (ref 3.5–5.1)

## 2010-07-28 LAB — CBC
HCT: 41 % (ref 36.0–46.0)
Hemoglobin: 14.4 g/dL (ref 12.0–15.0)
MCH: 32.4 pg (ref 26.0–34.0)
MCHC: 35.1 g/dL (ref 30.0–36.0)
RDW: 13.2 % (ref 11.5–15.5)

## 2010-07-29 LAB — PROTIME-INR: Prothrombin Time: 23.1 seconds — ABNORMAL HIGH (ref 11.6–15.2)

## 2010-07-29 LAB — CBC
MCH: 30.4 pg (ref 26.0–34.0)
MCHC: 32.5 g/dL (ref 30.0–36.0)
MCV: 93.3 fL (ref 78.0–100.0)
Platelets: 238 10*3/uL (ref 150–400)
RDW: 13.5 % (ref 11.5–15.5)

## 2010-07-30 NOTE — Op Note (Signed)
  NAME:  Traci, [name] NO.:  [id number]  MEDICAL RECORD NO.:  [id number]  LOCATION:  APED                          FACILITY:  [facility]  PHYSICIAN:  Duke Salvia, MD, FACCDATE OF BIRTH:  [birth year]  DATE OF PROCEDURE:  07/28/2010 DATE OF DISCHARGE:  07/25/2010                              OPERATIVE REPORT   PREOPERATIVE DIAGNOSIS:  Atrial fibrillation.  POSTOPERATIVE DIAGNOSIS:  Atrial fibrillation.  PROCEDURE:  Direct current cardioversion.  The patient was admitted for cardioversion under the care of Dr. Gelene Mink.  She received a 100 mg of propofol.  A-120 joule shock terminated atrial fibrillation and restored sinus rhythm.  About 15 seconds later, atrial fibrillation recurred.  She was cardioverted the second time with 120-joule shock again delivered in synchronized mode.  Again, sinus rhythm returned and is maintained for 20-30 seconds.  With its reversion, we undertook one more cardioversion again 120 joules delivered synchronously.  Sinus rhythm returned and then within about 15 seconds atrial fibrillation had returned.  We will plan for now to continue her Tikosyn and with cardioversion again scheduled for the morning.     Duke Salvia, MD, [facility]     SCK/MEDQ  D:  07/28/2010  T:  07/29/2010  Job:  [id number]  Electronically Signed by Sherryl Manges MD [facility] on 07/30/2010 04:36:09 PM

## 2010-08-01 ENCOUNTER — Telehealth: Payer: Self-pay | Admitting: Internal Medicine

## 2010-08-01 NOTE — Telephone Encounter (Signed)
hydrocodone

## 2010-08-01 NOTE — Discharge Summary (Addendum)
NAMEMarland Kitchen  Traci [name], Traci [name] NO.:  [id number]  MEDICAL RECORD NO.:  [id number]  LOCATION:  3709                         FACILITY:  [facility]  PHYSICIAN:  Doylene Canning. Ladona Ridgel, MD    DATE OF BIRTH:  Jun 10, 1934  DATE OF ADMISSION:  07/25/2010 DATE OF DISCHARGE:  07/30/2010                              DISCHARGE SUMMARY   DISCHARGE DIAGNOSES: 1. Atrial fibrillation.     a.     Initiated on Tikosyn this admission, status post failed      cardioversion on July 28, 2010, with repeat successful      cardioversion on July 29, 2010.     b.     History of symptomatic tachybrady syndrome status post      pacemaker implantation with Medtronic device. 2. Chronic anticoagulation. 3. Incidental coronary artery disease, August 2005. 4. Hyperlipidemia. 5. Hypertension. 6. Cerebral vascular disease status post right carotid endarterectomy     in 1999. 7. Status post appendectomy. 8. Status post hysterectomy.  HOSPITAL COURSE:  Traci [name] is a 76 year old female with a history of atrial fibrillation, hypertension and hyperlipidemia who has been followed by Dr. Ladona Ridgel for symptomatic AFib.  She continued to bother by her atrial fibrillation as an outpatient, who was felt to be a candidate for an antiarrhythmic medication.  Risks and benefits of Tikosyn were discussed with the patient and she was ultimately admitted for Tikosyn initiation July 25, 2010.  Prior to initiation of therapy, her verapamil had to be held and was ultimately discontinued.  Lab work was checked as an outpatient and was stable.  On July 28, 2010, she required a low molecular weight heparin for a subtherapeutic INR of 1.89.  She underwent cardioversion on July 28, 2010, which unfortunately did not keep.  There is some consideration for changing to amiodarone, if repeat cardioversion did not hold.  On July 29, 2010, a second cardioversion successfully restored normal sinus rhythm and she remained in sinus rhythm.  Duration  of the hospital stay.  She was with soap and yesterday, but had some weakness post sedation Wednesday for aspiration overnight.  Dr. Ladona Ridgel seen and examined her today and feels she is stable for discharge.  DISCHARGE LABS:  INR 2.0.  STUDIES: 1. CVA.  PROCEDURES: 1. Cardioversion 07/01 and July 29, 2010.  DISCHARGE MEDICATIONS: 1. Amlodipine 5 mg daily. 2. Tikosyn 250 mcg every 12 hours. 3. Alprazolam 1 mg half tablet daily nightly p.r.n. sleep. 4. Calcium carbonate/vitamin D 1200/1000 mg daily. 5. Crestor 5 mg daily. 6. Cod liver oil 1 capsule daily. 7. Digoxin 0.125 mg daily. 8. Fish oil 1000 mg daily. 9. Losartan 50 mg daily. 10.Niacin 250 mg daily. 11.Vitamin C 1000 mg t.i.d. 12.Vitamin E 4000 IU daily. 13.Coumadin 5 mg, I discussed her dosing with pharmacy as she was     supratherapeutic, again, but felt subtherapeutic this admission and     they feel that she should be discharged on her previous home dose     of Coumadin which includes 5 mg 1 tablet Tuesday, Wednesday,     Thursday and Friday and half tablet Saturday, Sunday and Monday.  Please note her verapamil was  discontinued this admission secondary to interaction with Tikosyn.  DISPOSITION:  Traci [name] will be discharged in stable condition to home. She is to increase activity slowly and follow a low-sodium heart-healthy diet.  She will have lab work on August 20, 2010, at 10:15 a.m. including BMET, magnesium and then follow up with Dr. Ladona Ridgel as an outpatient. Their our office will call her with this appointment.  She will have her INR checked at [facility] August 05, 2010, at 9:15 a.m. Case management has set her up for a 14-day supply from the hospital, Tikosyn pending her receiving a 56-month supply from her mail-order pharmacy.  DURATION OF DISCHARGE ENCOUNTER:  Greater than 30 minutes including physician and PA time.     Dayna Dunn, P.A.C.   ______________________________ Doylene Canning.  Ladona Ridgel, MD    DD/MEDQ  D:  07/30/2010  T:  07/30/2010  Job:  [id number]  cc:   Doylene Canning. Ladona Ridgel, MD Kirk Ruths, M.D.  Electronically Signed by Ronie Spies  on 08/01/2010 01:48:35 PM Electronically Signed by Lewayne Bunting MD on 08/15/2010 08:33:40 AM

## 2010-08-02 ENCOUNTER — Telehealth: Payer: Self-pay | Admitting: Internal Medicine

## 2010-08-02 NOTE — Telephone Encounter (Signed)
Pt has started tykosin and she wants to make sure she is going to be on medication prior to her getting her 90day supply. She thinks her heart is back in rhythm but she is not sure and her daughter is coming by her house to check.

## 2010-08-02 NOTE — Telephone Encounter (Signed)
Pt calling stating she does not want to order a 90 day supply of tikosyn as it's really expensive and    wants t to get it by the month instead--advised it will be more expensive, but pt states that if she doesn't stay on the tikosyn she'll be left with a lot of medication, she paid for but can't use--advised i would call in monthly supply for 3 months that she can pay for 1 month at a time--pt agrees--RX called into [facility] [place]

## 2010-08-05 ENCOUNTER — Telehealth: Payer: Self-pay | Admitting: Internal Medicine

## 2010-08-05 NOTE — Telephone Encounter (Signed)
Pt needs refill

## 2010-08-08 ENCOUNTER — Encounter: Payer: Self-pay | Admitting: Internal Medicine

## 2010-08-12 ENCOUNTER — Encounter: Payer: Medicare Other | Admitting: Internal Medicine

## 2010-08-12 NOTE — Discharge Summary (Addendum)
  NAMEMarland Kitchen  Traci [name], Traci [name] NO.:  [id number]  MEDICAL RECORD NO.:  [id number]  LOCATION:  3709                         FACILITY:  [facility]  PHYSICIAN:  Doylene Canning. Ladona Ridgel, MD    DATE OF BIRTH:  19-May-1934  DATE OF ADMISSION:  07/25/2010 DATE OF DISCHARGE:  07/30/2010                              DISCHARGE SUMMARY   ADDENDUM  The dictation inadvertently states, studies: 1. CVA in the prior discharge summary.  This is a complete error in     transcription and should be disregarded.     Dayna Dunn, P.A.C.   ______________________________ Doylene Canning. Ladona Ridgel, MD    DD/MEDQ  D:  08/01/2010  T:  08/02/2010  Job:  [id number]  Electronically Signed by Ronie Spies  on 08/12/2010 01:45:09 PM Electronically Signed by Lewayne Bunting MD on 08/15/2010 08:33:43 AM

## 2010-08-13 ENCOUNTER — Ambulatory Visit (INDEPENDENT_AMBULATORY_CARE_PROVIDER_SITE_OTHER): Payer: Medicare Other | Admitting: Internal Medicine

## 2010-08-13 ENCOUNTER — Encounter: Payer: Self-pay | Admitting: Internal Medicine

## 2010-08-13 VITALS — BP 132/77 | HR 118 | Ht 65.0 in | Wt 150.0 lb

## 2010-08-13 DIAGNOSIS — Z950 Presence of cardiac pacemaker: Secondary | ICD-10-CM

## 2010-08-13 DIAGNOSIS — I4891 Unspecified atrial fibrillation: Secondary | ICD-10-CM

## 2010-08-13 DIAGNOSIS — E785 Hyperlipidemia, unspecified: Secondary | ICD-10-CM

## 2010-08-13 DIAGNOSIS — I495 Sick sinus syndrome: Secondary | ICD-10-CM

## 2010-08-13 LAB — PACEMAKER DEVICE OBSERVATION
ATRIAL PACING PM: 6
BATTERY VOLTAGE: 2.78 V
BRDY-0002RV: 60 {beats}/min
BRDY-0003RV: 130 {beats}/min
BRDY-0004RV: 130 {beats}/min
RV LEAD AMPLITUDE: 11.2 mv
VENTRICULAR PACING PM: 1

## 2010-08-13 MED ORDER — AMIODARONE HCL 200 MG PO TABS: ORAL_TABLET | ORAL | Status: DC

## 2010-08-13 NOTE — Progress Notes (Signed)
HPI [name] Returns today for followup. She is a pleasant 76 year old woman who was hospitalized a couple of weeks ago to start dofetilide. She underwent cardioversion initially which was unsuccessful. 2 days later she underwent repeat cardioversion where she was restored to sinus rhythm. She stated rhythm initially but went out of rhythm approximately 48 hours after discharge from the hospital. Since then she has been out of rhythm more than two thirds of the time. She denies syncope, or peripheral edema. She feels palpitations and does have dyspnea with exertion. She is clearly better when she is in sinus rhythm. Allergies  Allergen Reactions  . Morphine Nausea Only     Current Outpatient Prescriptions  Medication Sig Dispense Refill  . ALPRAZolam (XANAX) 1 MG tablet Take 0.5 mg by mouth at bedtime as needed.       Marland Kitchen amLODipine (NORVASC) 5 MG tablet 1 tab daily      . Ascorbic Acid (VITAMIN C) 1000 MG tablet Take 1,000 mg by mouth 2 (two) times daily.       . Calcium Carbonate-Vit D-Min (CALCIUM 1200 PO) Take 1 capsule by mouth 2 (two) times daily.       . COD LIVER OIL PO Take 1 tablet by mouth daily.        . digoxin (LANOXIN) 0.125 MG tablet Take 125 mcg by mouth daily.        Marland Kitchen HYDROcodone-acetaminophen (NORCO) 7.5-325 MG per tablet As needed      . LECITHIN PO Take by mouth as directed.        Marland Kitchen losartan (COZAAR) 50 MG tablet Take 50 mg by mouth daily.        . mupirocin (BACTROBAN) 2 % ointment       . niacin 250 MG tablet Take 250 mg by mouth daily with breakfast.        . Omega-3 Fatty Acids (FISH OIL) 1000 MG CAPS Take 1 capsule by mouth daily.        . rosuvastatin (CRESTOR) 5 MG tablet Take 5 mg by mouth daily.        Marland Kitchen TIKOSYN 250 MCG capsule 2 (two) times daily.       Marland Kitchen warfarin (COUMADIN) 5 MG tablet Take 5 mg by mouth daily.           Past Medical History  Diagnosis Date  . Sick sinus syndrome     PAF in 8,9,10/2003,1/06;insignificant CAD in  08/2003  . Sick sinus  syndrome     ddd pacing medtronic rhythm 05/2004   . Chronic anticoagulation     followed by PMD  . Hyperlipidemia   . Hypertension   . Cerebrovascular disease     right carotid endarectomy in 1999;negative duplex in 06/2004  . Pneumonia     bilateral pneumonia 2010  . Hyperglycemia     fasting    ROS:   All systems reviewed and negative except as noted in the HPI.   Past Surgical History  Procedure Date  . Appendectomy   . Abdominal hysterectomy   . Right carotid endarectomy     1999  . Pacemaker placement     2006  . Colonoscopy     2006     No family history on file.   History   Social History  . Marital Status: Married    Spouse Name: N/A    Number of Children: N/A  . Years of Education: N/A   Occupational History  . Not on file.  Social History Main Topics  . Smoking status: Never Smoker   . Smokeless tobacco: Never Used  . Alcohol Use: No  . Drug Use: No  . Sexually Active: Not on file   Other Topics Concern  . Not on file   Social History Narrative  . No narrative on file     BP 132/77  Pulse 118  Ht 5\' 5"  (1.651 m)  Wt 150 lb (68.04 kg)  BMI 24.96 kg/m2  Physical Exam:  Well appearing NAD HEENT: Unremarkable Neck:  No JVD, no thyromegally Lymphatics:  No adenopathy Back:  No CVA tenderness Lungs:  Clear HEART:  Iregular rate rhythm, no murmurs, no rubs, no clicks Abd:  soft, positive bowel sounds, no organomegally, no rebound, no guarding Ext:  2 plus pulses, no edema, no cyanosis, no clubbing Skin:  No rashes no nodules Neuro:  CN II through XII intact, motor grossly intact  DEVICE  Normal device function.  See PaceArt for details. Mostly atrial fibrillation  Assess/Plan:

## 2010-08-13 NOTE — Assessment & Plan Note (Signed)
She is out of rhythm more than she is in rhythm. After discussing the treatment options with the patient, I have recommended stopping dofetilide and starting amiodarone. We'll initially place her on low dose amiodarone with plans to up titrate as needed in the future. She will need to have her Coumadin levels checked within a week of up titration. I will plan to see her back in several weeks.

## 2010-08-13 NOTE — Assessment & Plan Note (Signed)
Her device is working normally. We'll plan to recheck in several months. 

## 2010-08-13 NOTE — Patient Instructions (Addendum)
Your physician recommends that you schedule a follow-up appointment in: 6 weeks with Dr Valora Corporal appointment in 2 weeks   Your physician has recommended you make the following change in your medication: stop Tikosyn today and start Amiodarone 100mg  1/2 of a 200mg  tablet twice daily on Thursday  Will need to have her INR checked in about one week after starting Amiodarone

## 2010-08-13 NOTE — Assessment & Plan Note (Signed)
She is instructed to maintain a low fat diet and continue her current medical therapy

## 2010-08-14 ENCOUNTER — Ambulatory Visit (HOSPITAL_COMMUNITY)
Admission: RE | Admit: 2010-08-14 | Discharge: 2010-08-14 | Disposition: A | Discharge status: Home / Self Care | Payer: Medicare Other | Source: Ambulatory Visit | Attending: Family Medicine | Admitting: Family Medicine

## 2010-08-14 DIAGNOSIS — X58XXXA Exposure to other specified factors, initial encounter: Secondary | ICD-10-CM | POA: Insufficient documentation

## 2010-08-14 DIAGNOSIS — R262 Difficulty in walking, not elsewhere classified: Secondary | ICD-10-CM | POA: Insufficient documentation

## 2010-08-14 DIAGNOSIS — S91309A Unspecified open wound, unspecified foot, initial encounter: Secondary | ICD-10-CM | POA: Insufficient documentation

## 2010-08-14 DIAGNOSIS — IMO0001 Reserved for inherently not codable concepts without codable children: Secondary | ICD-10-CM | POA: Insufficient documentation

## 2010-08-14 NOTE — Progress Notes (Signed)
Physical Therapy Evaluation  Patient Name: Traci [name] Date: 08/14/2010 HPI:  Pt is a 76 yo female who states she dropped an aerosol can on her R forefoot three weeks ago. That evening she was having so much pain that she went to the ER. X-rays were (-) she was given pain medication and sent home.   She was going into the hospital the next day to be monitored on a new heart medication and then have her pacemaker shocked into rhythm.   She asked the MD to look at her foot and he suggested to elevate it.  She was discharged from the hospital three days later.  She went to see a foot MD on 7/911 who aspirated blood out of her blister and referred her to another foot MD. The patient decided to go to her medical MD instead.  She is now being referred for physical therapy.      Wound:  The patient has a large hematoma on the forefoot of her R foot that is circular in nature with a small extended area on the superior lateral aspect.  The circular area is 3cm wide and 4 cm long, the extended area is .7cm wide and 1.5 cm wide.  Depth is unknown at this time as it is covered with gelled blood that rises 1 cm higher than the skin level.  The wound if very painful (9/10 increased to 10/10 with touch), there is a slight foul smell and there is minimal bloody drainage.  Pain Assessment Currently in Pain?: Yes Pain Score:   9 Pain Location: Foot Pain Orientation: Right Pain Type: Acute pain Pain Onset: 1 to 4 weeks ago Pain Frequency: Constant Pain Relieving Factors: elevating, medication Effect of Pain on Daily Activities: increases Past Medical History:  Past Medical History  Diagnosis Date  . Sick sinus syndrome     PAF in 8,9,10/2003,1/06;insignificant CAD in  08/2003  . Sick sinus syndrome     ddd pacing medtronic rhythm 05/2004   . Chronic anticoagulation     followed by PMD  . Hyperlipidemia   . Hypertension   . Cerebrovascular disease     right carotid endarectomy in 1999;negative duplex  in 06/2004  . Pneumonia     bilateral pneumonia 2010  . Hyperglycemia     fasting   Past Surgical History:  Past Surgical History  Procedure Date  . Appendectomy   . Abdominal hysterectomy   . Right carotid endarectomy     1999  . Pacemaker placement     2006  . Colonoscopy     2006    Precautions/Restrictions  Restrictions Weight Bearing Restrictions: No Other Position/Activity Restrictions:  (Pt c/o increased pain whenever she weightbears.  Using crutc)  Prior Functioning  Home Living Type of Home: House Lives With: Spouse;Daughter Prior Function Level of Independence: Independent with basic ADLs Driving: No Able to Take Stairs Reciprically: No Vocation: Retired Leisure: Hobbies-no  Cognition Cognition Overall Cognitive Status: Appears within functional limits for tasks assessed Arousal/Alertness: Awake/alert Orientation Level: Oriented X4  Sensation/Coordination/Flexibility    Assessment RLE AROM (degrees) Overall AROM Right Lower Extremity: Due to pain RLE Strength RLE Overall Strength:  (not tested secondary to pain)  Mobility (including Balance)       Exercise/Treatments  seen for evaluation, debridement and dressing change as well as HEP    Goals PT Short Term Goals Short Term Goal 1: wound to be 30 % granulated Short Term Goal 2: Patient to be I  in HEP Short Term Goal 3: Patient to be I with dressing changes Short Term Goal 4: Pain to be decreased by 3 PT Long Term Goals Long Term Goal 1: Wound to be 100% granulated Long Term Goal 2: ROM to be [facility] Long Term Goal 3: Pain to be decreased by 6 levels End of Session Patient Active Problem List  Diagnoses  . CARDIAC PACEMAKER IN SITU  . HYPERLIPIDEMIA  . SICK SINUS SYNDROME  . FASTING HYPERGLYCEMIA  . Fatigue  . Atrial fibrillation  . Difficulty in walking   PT - End of Session Activity Tolerance: Patient limited by pain General Behavior During Session: [facility] for tasks  performed Cognition: [facility] for tasks performed PT Assessment and Plan Clinical Impression Statement: infected wound which will need skilled care for debridement and dressing change Rehab Potential: Good PT Frequency: Min 3X/week PT Duration: 4 weeks PT Treatment/Interventions: Other (comment) (wound care debridement and dressing change) PT Plan: see pt 3xwk for 4 wk for debridement and dressing change.  Used hydrogell and xeroform for dressing  exercise to ensure ROM is wnl; gait training with crutch. Time Calculation Start Time: 1300 Stop Time: 1348 Time Calculation (min): 48 min  RUSSELL,CINDY 08/14/2010, 3:16 PM

## 2010-08-14 NOTE — Procedures (Signed)
NAME:  Traci [name], Traci [name] NO.:  [id number]  MEDICAL RECORD NO.:  [id number]  LOCATION:  CARDIOPU                      FACILITY:  [facility]  PHYSICIAN:  Gerrit Friends. Dietrich Pates, MD, FACCDATE OF BIRTH:  09-28-1934  DATE OF PROCEDURE: DATE OF DISCHARGE:  06/26/2010                               HOLTER MONITOR   REFERRING PHYSICIAN:  Doylene Canning. Ladona Ridgel, MD  CLINICAL DATA:  A 76 year old woman with previous pacemaker implantation and sick sinus syndrome. 1. Continuous electrocardiographic recording was maintained for 47     hours and 50 minutes during which the predominant rhythm was atrial     fibrillation with a controlled ventricular response.  Brief     episodes of tachycardia did occur with heart rates up to 140 beats     per minute. 2. No significant arrhythmias were identified other than the patient's     underlying atrial fibrillation.  Rare ventricular ectopic complexes     were recorded, occurring at the rate of 1 every 2 hours. 3. Demand pacemaker was activated appropriately, but infrequently.     Occasional isolated paced beats were noted.  There were very     infrequent and brief periods of continuous pacing. 4. A diary of activity was returned reporting generalized weakness and     malaise throughout the 2-day interval.  No specific symptoms were     identified.     Gerrit Friends. Dietrich Pates, MD, [facility]     RMR/MEDQ  D:  07/09/2010  T:  07/10/2010  Job:  [id number]

## 2010-08-14 NOTE — Patient Instructions (Signed)
HEP for ankle rom

## 2010-08-15 NOTE — Op Note (Signed)
  NAMEMarland Kitchen  Traci [name], Traci [name] NO.:  [id number]  MEDICAL RECORD NO.:  [id number]  LOCATION:  3709                         FACILITY:  [facility]  PHYSICIAN:  Doylene Canning. Ladona Ridgel, MD    DATE OF BIRTH:  1934-07-11  DATE OF PROCEDURE:  07/29/2010 DATE OF DISCHARGE:                              OPERATIVE REPORT   PROCEDURE PERFORMED:  Direct current cardioversion.  INDICATIONS:  Symptomatic atrial fibrillation.  INTRODUCTION:  The patient is a 76 year old woman with a history of persistent atrial fibrillation who had failed beta-blockers in the past. She has had very difficult to control atrial fibrillation despite medical therapy and she is now referred for DC cardioversion.  Of note, the patient has been started on Tikosyn therapy at 250 mcg twice daily.  PROCEDURE:  After informed obtained, the patient was taken to the diagnostic catheterization lab in fasting state.  After usual preparation and draping, intravenous fentanyl and midazolam were given for sedation.  A 200 joules of synchronized biphasic energy was subsequently applied to the electrode dispersive pad which was placed in the anterior-posterior position resulting in termination of atrial fibrillation and restoration of sinus rhythm.  The patient tolerated the procedure well.  While she was allowed to awaken, she was given 5 mg of IV Lopressor x2 to help try to keep her in sinus rhythm.  She was returned to her room in satisfactory condition.  COMPLICATIONS:  There were no immediate procedure complications.  RESULTS:  Demonstrate successful DC cardioversion in a patient with refractory atrial fibrillation who is now on Tikosyn therapy.     Doylene Canning. Ladona Ridgel, MD     GWT/MEDQ  D:  07/29/2010  T:  07/30/2010  Job:  [id number]  cc:   Gerrit Friends. Dietrich Pates, MD, [facility] Kirk Ruths, M.D.  Electronically Signed by Lewayne Bunting MD on 08/15/2010 08:33:46 AM

## 2010-08-16 ENCOUNTER — Ambulatory Visit (HOSPITAL_COMMUNITY)
Admission: RE | Admit: 2010-08-16 | Discharge: 2010-08-16 | Disposition: A | Discharge status: Home / Self Care | Payer: Medicare Other | Source: Ambulatory Visit | Attending: Family Medicine | Admitting: Family Medicine

## 2010-08-16 ENCOUNTER — Ambulatory Visit (HOSPITAL_COMMUNITY)
Admission: RE | Admit: 2010-08-16 | Discharge: 2010-08-16 | Disposition: A | Discharge status: Home / Self Care | Payer: Medicare Other | Source: Ambulatory Visit | Admitting: Physical Therapy

## 2010-08-16 ENCOUNTER — Telehealth (HOSPITAL_COMMUNITY): Payer: Self-pay

## 2010-08-16 NOTE — Progress Notes (Signed)
Physical Therapy Treatment Patient Name: Traci [name] UEAVW'U Date: 08/16/2010  Time In: 9:00 Time Out: 10:20 Visit #: 2/2 Next Re-eval: 09/14/2010 Charge: selective debridement <20 cm    Subjective: Symptoms/Limitations Symptoms: 4/10, felt so good following last session.  Objective: see wound therapy doc flowsheet     Exercise/Treatments  wound debridment and dressing change complete.     PT - End of Session Activity Tolerance: Patient tolerated treatment well General Behavior During Session: [facility] for tasks performed Cognition: [facility] for tasks performed PT Assessment and Plan Clinical Impression Statement: irrigation, debridement, tunnel filled with aerocel. dressing change. Rehab Potential: Good PT Frequency: Min 3X/week PT Duration: 4 weeks PT Plan: continue wound care debridement and dressing change  Juel Burrow 08/16/2010, 10:46 AM

## 2010-08-19 ENCOUNTER — Ambulatory Visit (HOSPITAL_COMMUNITY): Payer: Medicare Other

## 2010-08-19 ENCOUNTER — Ambulatory Visit (HOSPITAL_COMMUNITY)
Admission: RE | Admit: 2010-08-19 | Discharge: 2010-08-19 | Disposition: A | Discharge status: Home / Self Care | Payer: Medicare Other | Source: Ambulatory Visit | Attending: Family Medicine | Admitting: Family Medicine

## 2010-08-19 NOTE — Progress Notes (Signed)
Physical Therapy Treatment Patient Name: Traci [name] AOZHY'Q Date: 08/19/2010  Time In: 10:05 Time Out: 10:40 Visit #: 3/3 Next Re-eval: 09/14/2010 Charge: selective debridement <20 cm  Subjective: Symptoms/Limitations Symptoms: no pain today, pre medicated  Pain Assessment Currently in Pain?: No/denies  Objective:    Exercise/Treatments  wound care    End of Session Patient Active Problem List  Diagnoses  . CARDIAC PACEMAKER IN SITU  . HYPERLIPIDEMIA  . SICK SINUS SYNDROME  . FASTING HYPERGLYCEMIA  . Fatigue  . Atrial fibrillation  . Difficulty in walking   PT - End of Session Activity Tolerance: Patient tolerated treatment well General Behavior During Session: [facility] for tasks performed Cognition: [facility] for tasks performed PT Assessment and Plan Clinical Impression Statement: irrigation/debridement, packing with aerocel, new dressings.  Pt. encouraged to call MD to schedule appt to see if antibiotics to reduce redness/swelling. PT Plan: Continue with current POC, wound care debridement and dressing chance.  Juel Burrow 08/19/2010, 1:00 PM

## 2010-08-20 ENCOUNTER — Other Ambulatory Visit: Payer: Medicare Other | Admitting: *Deleted

## 2010-08-21 ENCOUNTER — Ambulatory Visit (HOSPITAL_COMMUNITY): Payer: Medicare Other | Admitting: Physical Therapy

## 2010-08-23 ENCOUNTER — Ambulatory Visit (HOSPITAL_COMMUNITY): Payer: Medicare Other | Admitting: Physical Therapy

## 2010-08-23 ENCOUNTER — Telehealth: Payer: Self-pay | Admitting: Internal Medicine

## 2010-08-23 NOTE — Telephone Encounter (Signed)
LMOM for daughter that if Dr Kandy Garrison is following her INR's he should be dosing her Coumadin  The last INR I have is from 07/30/10 and she can call me back if needed

## 2010-08-23 NOTE — Telephone Encounter (Signed)
Pt calling back her test results- coumadin is 5. - Dr. Carlos American- [phone]. Or [phone]. Wants to discuss with nurse.

## 2010-08-23 NOTE — Telephone Encounter (Signed)
Patient will need to lower the dose of Coumadin due to her starting Amiodarone    She is going to keep appointment on Mon with Dr Kandy Garrison and will then try and follow up with our Coumadin Clinic

## 2010-08-23 NOTE — Telephone Encounter (Signed)
Per pt call, pt checking to see if coumadin levels were sent over to from Dr. Kandy Garrison. Pt said coumadin is 5 and pt wants to make sure Dr. Ladona Ridgel is aware of coumadin levels. Please return call to pt to advise/discuss.   [facility]: Dr. Kandy Garrison   Please call pt daughter who will get in touch with pt. [phone]

## 2010-08-26 ENCOUNTER — Ambulatory Visit (HOSPITAL_COMMUNITY): Payer: Medicare Other | Admitting: Physical Therapy

## 2010-08-27 ENCOUNTER — Encounter: Payer: Self-pay | Admitting: Podiatry

## 2010-08-27 ENCOUNTER — Ambulatory Visit (HOSPITAL_COMMUNITY)
Admission: RE | Admit: 2010-08-27 | Discharge: 2010-08-27 | Disposition: A | Discharge status: Home / Self Care | Payer: Medicare Other | Source: Ambulatory Visit | Attending: Family Medicine | Admitting: Family Medicine

## 2010-08-27 DIAGNOSIS — S9030XA Contusion of unspecified foot, initial encounter: Secondary | ICD-10-CM | POA: Insufficient documentation

## 2010-08-27 NOTE — Telephone Encounter (Signed)
Pt did see Dr. Kandy Garrison this week but does not have INR results yet. Will check INR in [place] office next Thursday on 8/9

## 2010-08-27 NOTE — Progress Notes (Signed)
Physical Therapy Treatment Patient Name: Traci [name] [id number] Date: 08/27/2010  HPI:  Dog bite with resulting infection.                                                                                                                                                                   Symptoms/Limitations Symptoms: Pt has gone to a Careers adviser who has been debriding the wound in his office.  The patient has now been referred to physcial therapy for a second time for wound care on a daily basis. Pain Assessment Currently in Pain?: Yes Pain Score:   5 Pain Location: Foot Pain Orientation: Right Pain Type: Chronic pain Pain Onset: 1 to 4 weeks ago Pain Frequency: Constant Pain Relieving Factors: elevating Effect of Pain on Daily Activities: walking increases pain Multiple Pain Sites: No  Precautions/Restrictions     Mobility (including Balance)       Exercise/Treatments      Goals PT Short Term Goals Short Term Goal 1 Progress: Met Short Term Goal 2 Progress: Met Short Term Goal 3 Progress: Met PT Long Term Goals Long Term Goal 1 Progress: Not met Long Term Goal 2 Progress: Not met Long Term Goal 3 Progress: Not met End of Session Patient Active Problem List  Diagnoses  . CARDIAC PACEMAKER IN SITU  . HYPERLIPIDEMIA  . SICK SINUS SYNDROME  . FASTING HYPERGLYCEMIA  . Fatigue  . Atrial fibrillation  . Difficulty in walking   PT - End of Session Activity Tolerance: Patient tolerated treatment well General Behavior During Session: [facility] for tasks performed Cognition: [facility] for tasks performed PT Assessment and Plan Clinical Impression Statement: Pt wound has been debrided by MD, able to see depth now.  MD requests wound care in therapy with pulse lavage 5x/wk. Rehab Potential: Good PT Frequency: Min 5X/week PT Duration: 4 weeks PT Treatment/Interventions: Other (comment) (wound care.) PT Plan: continue to see patient 5x/week to increase  granulation.  Traci [name],Traci [name] 08/27/2010, 11:31 AM

## 2010-08-27 NOTE — Patient Instructions (Signed)
Hep ROM

## 2010-08-28 ENCOUNTER — Ambulatory Visit (HOSPITAL_COMMUNITY)
Admission: RE | Admit: 2010-08-28 | Discharge: 2010-08-28 | Disposition: A | Discharge status: Home / Self Care | Payer: Medicare Other | Source: Ambulatory Visit | Attending: Family Medicine | Admitting: Family Medicine

## 2010-08-28 ENCOUNTER — Ambulatory Visit (HOSPITAL_COMMUNITY): Payer: Medicare Other | Admitting: Physical Therapy

## 2010-08-28 DIAGNOSIS — X58XXXA Exposure to other specified factors, initial encounter: Secondary | ICD-10-CM | POA: Insufficient documentation

## 2010-08-28 DIAGNOSIS — IMO0001 Reserved for inherently not codable concepts without codable children: Secondary | ICD-10-CM | POA: Insufficient documentation

## 2010-08-28 DIAGNOSIS — S91309A Unspecified open wound, unspecified foot, initial encounter: Secondary | ICD-10-CM | POA: Insufficient documentation

## 2010-08-29 ENCOUNTER — Ambulatory Visit (HOSPITAL_COMMUNITY)
Admission: RE | Admit: 2010-08-29 | Discharge: 2010-08-29 | Payer: Medicare Other | Source: Ambulatory Visit | Attending: Physical Therapy | Admitting: Physical Therapy

## 2010-08-29 NOTE — Patient Instructions (Signed)
Dressing change.

## 2010-08-29 NOTE — Progress Notes (Signed)
Physical Therapy Treatment Patient Name: Traci [name] [id number] Date:08/28/2010  HPI: infected wound                                                                                                                                                                                                      Symptoms/Limitations Symptoms: Pt states her pain is improving. Pain Assessment Currently in Pain?: Yes Pain Score:   5 Pain Location: Foot Pain Orientation: Right Pain Type: Chronic pain Pain Onset: 1 to 4 weeks ago Pain Frequency: Intermittent Multiple Pain Sites: No  Precautions/Restrictions     Mobility (including Balance)       Exercise/Treatments      Goals   End of Session Patient Active Problem List  Diagnoses  . CARDIAC PACEMAKER IN SITU  . HYPERLIPIDEMIA  . SICK SINUS SYNDROME  . FASTING HYPERGLYCEMIA  . Fatigue  . Atrial fibrillation  . Difficulty in walking  . Traumatic hematoma of foot   PT - End of Session Activity Tolerance: Patient tolerated treatment well General Behavior During Session: [facility] for tasks performed Cognition: [facility] for tasks performed PT Assessment and Plan Clinical Impression Statement: Patient is improving in ability to ambulate with a normalized gait. Rehab Potential: Good PT Frequency: Min 5X/week PT Duration: 4 weeks PT Plan: Gt train with no assistive device next week.  RUSSELL,CINDY 08/29/2010, 1:03 PM

## 2010-08-29 NOTE — Progress Notes (Signed)
Physical Therapy Treatment Patient Name: Traci [name] Date: 08/29/2010  HPI:  Infected wound. Symptoms/Limitations Symptoms: Pt walking with improved gait Pain Assessment Currently in Pain?: Yes Pain Score:   4 Pain Location: Foot Pain Orientation: Right Pain Type: Chronic pain Pain Onset: 1 to 4 weeks ago Pain Frequency: Intermittent Multiple Pain Sites: No      Mobility (including Balance) Ambulating with crutches.       Exercise/Treatments wound care with pulse lavage/debridment.     Goals  wound to heal  End of Session Patient Active Problem List  Diagnoses  . CARDIAC PACEMAKER IN SITU  . HYPERLIPIDEMIA  . SICK SINUS SYNDROME  . FASTING HYPERGLYCEMIA  . Fatigue  . Atrial fibrillation  . Difficulty in walking  . Traumatic hematoma of foot   PT - End of Session Activity Tolerance: Patient tolerated treatment well General Behavior During Session: [facility] for tasks performed Cognition: [facility] for tasks performed PT Assessment and Plan Clinical Impression Statement: Wound continues to increase in granulation. Rehab Potential: Good PT Frequency: Min 5X/week PT Duration: 4 weeks PT Plan: continue to see pt for wound care.  RUSSELL,CINDY 08/29/2010, 1:20 PM

## 2010-08-30 ENCOUNTER — Ambulatory Visit (HOSPITAL_COMMUNITY)
Admission: RE | Admit: 2010-08-30 | Discharge: 2010-08-30 | Disposition: A | Discharge status: Home / Self Care | Payer: Medicare Other | Source: Ambulatory Visit | Attending: Physical Therapy | Admitting: Physical Therapy

## 2010-08-30 ENCOUNTER — Ambulatory Visit (HOSPITAL_COMMUNITY): Payer: Medicare Other | Admitting: Physical Therapy

## 2010-09-02 ENCOUNTER — Ambulatory Visit (HOSPITAL_COMMUNITY)
Admission: RE | Admit: 2010-09-02 | Discharge: 2010-09-02 | Disposition: A | Discharge status: Home / Self Care | Payer: Medicare Other | Source: Ambulatory Visit | Attending: Family Medicine | Admitting: Family Medicine

## 2010-09-02 NOTE — Progress Notes (Signed)
Physical Therapy Treatment Patient Name: Traci [name] [id number]'J Date: 09/02/2010  HPI:  Infected wound.    Pt seen for debridement.    Precautions/Restrictions     Mobility (including Balance)     using crutches secondary to increased swelling. Exercise/Treatments  Pulse lavage f/b mechanical debridement and dressing change with saline soaked 4x4 and kling.    Goals   End of Session Patient Active Problem List  Diagnoses  . CARDIAC PACEMAKER IN SITU  . HYPERLIPIDEMIA  . SICK SINUS SYNDROME  . FASTING HYPERGLYCEMIA  . Fatigue  . Atrial fibrillation  . Difficulty in walking  . Traumatic hematoma of foot   PT - End of Session Activity Tolerance: Patient tolerated treatment well General Behavior During Session: [facility] for tasks performed Cognition: [facility] for tasks performed PT Assessment and Plan Clinical Impression Statement: wound continues to improve with granulation and depth continues to decrease Rehab Potential: Good PT Frequency: Min 5X/week PT Duration: 4 weeks PT Treatment/Interventions: Other (comment) (wound care) PT Plan: Pt to be seen 5x week  RUSSELL,CINDY 09/02/2010, 12:46 PM

## 2010-09-02 NOTE — Patient Instructions (Signed)
To keep leg elevated.

## 2010-09-03 ENCOUNTER — Ambulatory Visit (HOSPITAL_COMMUNITY)
Admission: RE | Admit: 2010-09-03 | Discharge: 2010-09-03 | Disposition: A | Discharge status: Home / Self Care | Payer: Medicare Other | Source: Ambulatory Visit | Attending: Family Medicine | Admitting: Family Medicine

## 2010-09-03 NOTE — Patient Instructions (Signed)
PT. Educated on signs/symptoms of infection.  Instructed to elevate LE above heart level.

## 2010-09-03 NOTE — Progress Notes (Signed)
Physical Therapy Treatment Patient Name: Traci [name] Date: 09/03/2010  Time in:  11:52 Time out: 12:25 Visits #: [id number] Initial evaluation: 08/14/10 Charges: debridement <20cm   SUBJECTIVE: Symptoms/Limitations Symptoms: Pt reports she was up last night with pain/swelling Pain Assessment Currently in Pain?: Yes Pain Score:   4 Pain Location: Foot Pain Orientation: Right  OBJECTIVE: Pulsed lavage to Right dorsal wound. 85% granulation/15% slough.  Wound dressed with silver acticoat/hydrogel.  Compression applied to knee to promote edema reduction on dorsal foot.     End of Session Patient Active Problem List  Diagnoses  . CARDIAC PACEMAKER IN SITU  . HYPERLIPIDEMIA  . SICK SINUS SYNDROME  . FASTING HYPERGLYCEMIA  . Fatigue  . Atrial fibrillation  . Difficulty in walking  . Traumatic hematoma of foot   PT - End of Session Equipment Utilized During Treatment: Other (comment) (pulsed lavage) Activity Tolerance: Patient tolerated treatment well General Behavior During Session: [facility] for tasks performed Cognition: [facility] for tasks performed PT Assessment and Plan Clinical Impression Statement: Granulation increasing, however swelling/redness dorsal foot.  Pt instructed to keep elevated and educated with Signs and symptoms of infection, PT Treatment/Interventions:  Gladis Riffle) PT Plan: Continue daily woundcare per POC  [place], Amy B 09/03/2010, 12:33 PM

## 2010-09-04 ENCOUNTER — Ambulatory Visit (HOSPITAL_COMMUNITY): Payer: Medicare Other | Admitting: Physical Therapy

## 2010-09-05 ENCOUNTER — Ambulatory Visit (INDEPENDENT_AMBULATORY_CARE_PROVIDER_SITE_OTHER): Payer: Medicare Other | Admitting: *Deleted

## 2010-09-05 ENCOUNTER — Ambulatory Visit (HOSPITAL_COMMUNITY): Payer: Medicare Other | Admitting: Physical Therapy

## 2010-09-05 DIAGNOSIS — Z7901 Long term (current) use of anticoagulants: Secondary | ICD-10-CM

## 2010-09-05 DIAGNOSIS — I4891 Unspecified atrial fibrillation: Secondary | ICD-10-CM

## 2010-09-05 NOTE — Progress Notes (Signed)
Physical Therapy Treatment Patient Name: YULIA ULRICH EAVWU'J Date: 09/05/2010  HPI: Symptoms/Limitations Symptoms: Pt has gone to a surgeon who has been debriding the wound in his office.  The patient has now been referred to physcial therapy for a second time for wound care on a daily basis. Pain Assessment Currently in Pain?: Yes Pain Score:   5 Pain Location: Foot Pain Orientation: Right Pain Type: Chronic pain Pain Onset: 1 to 4 weeks ago Pain Frequency: Constant Pain Relieving Factors: elevating Effect of Pain on Daily Activities: walking increases pain  Precautions/Restrictions     Mobility (including Balance)       Exercise/Treatments      Goals PT Short Term Goals Short Term Goal 1 Progress: Met Short Term Goal 2 Progress: Met Short Term Goal 3 Progress: Met PT Long Term Goals Long Term Goal 1 Progress: Not met Long Term Goal 2 Progress: Not met Long Term Goal 3 Progress: Not met End of Session Patient Active Problem List  Diagnoses  . CARDIAC PACEMAKER IN SITU  . HYPERLIPIDEMIA  . SICK SINUS SYNDROME  . FASTING HYPERGLYCEMIA  . Fatigue  . Atrial fibrillation  . Difficulty in walking  . Traumatic hematoma of foot  . Encounter for long-term (current) use of anticoagulants      RUSSELL,CINDY 09/05/2010, 1:25 PM Addendum>  This patient did not have a dog bite; this was a different patient.  The patient had an aerosol  can drop onto her foot causing a hematoma.  The patient was initially referred to this clinic.  We recommended a Careers adviser.  The patient has been being treated by the surgeon and is now being referred back to physical therapy for continued wound care.

## 2010-09-06 ENCOUNTER — Ambulatory Visit (HOSPITAL_COMMUNITY): Payer: Medicare Other

## 2010-09-09 ENCOUNTER — Ambulatory Visit (HOSPITAL_COMMUNITY): Payer: Medicare Other | Admitting: Physical Therapy

## 2010-09-11 ENCOUNTER — Ambulatory Visit (HOSPITAL_COMMUNITY): Payer: Medicare Other | Admitting: Physical Therapy

## 2010-09-13 ENCOUNTER — Ambulatory Visit (HOSPITAL_COMMUNITY): Payer: Medicare Other | Admitting: Physical Therapy

## 2010-09-16 ENCOUNTER — Ambulatory Visit (HOSPITAL_COMMUNITY): Payer: Medicare Other | Admitting: Physical Therapy

## 2010-09-18 ENCOUNTER — Ambulatory Visit (HOSPITAL_COMMUNITY): Payer: Medicare Other | Admitting: Physical Therapy

## 2010-09-19 ENCOUNTER — Ambulatory Visit (INDEPENDENT_AMBULATORY_CARE_PROVIDER_SITE_OTHER): Payer: Medicare Other | Admitting: *Deleted

## 2010-09-19 DIAGNOSIS — I4891 Unspecified atrial fibrillation: Secondary | ICD-10-CM

## 2010-09-19 DIAGNOSIS — Z7901 Long term (current) use of anticoagulants: Secondary | ICD-10-CM

## 2010-09-20 ENCOUNTER — Ambulatory Visit (HOSPITAL_COMMUNITY): Payer: Medicare Other | Admitting: Physical Therapy

## 2010-10-02 ENCOUNTER — Ambulatory Visit (INDEPENDENT_AMBULATORY_CARE_PROVIDER_SITE_OTHER): Payer: Medicare Other | Admitting: Internal Medicine

## 2010-10-02 ENCOUNTER — Encounter: Payer: Self-pay | Admitting: Internal Medicine

## 2010-10-02 DIAGNOSIS — Z950 Presence of cardiac pacemaker: Secondary | ICD-10-CM

## 2010-10-02 DIAGNOSIS — I4891 Unspecified atrial fibrillation: Secondary | ICD-10-CM

## 2010-10-02 DIAGNOSIS — E785 Hyperlipidemia, unspecified: Secondary | ICD-10-CM

## 2010-10-02 LAB — PACEMAKER DEVICE OBSERVATION
ATRIAL PACING PM: 2
BAMS-0001: 175 {beats}/min
BRDY-0004RV: 130 {beats}/min
RV LEAD IMPEDENCE PM: 556 Ohm
RV LEAD THRESHOLD: 0.75 V

## 2010-10-02 NOTE — Patient Instructions (Signed)
Your physician wants you to follow-up in: 3 months in the [facility].  You will receive a reminder letter in the mail two months in advance. If you don't receive a letter, please call our office to schedule the follow-up appointment.

## 2010-10-02 NOTE — Assessment & Plan Note (Signed)
She remains in atrial for ablation but her ventricular rate appears to be under better control. Her symptoms of dyspnea and palpitations have improved. Today we discussed the possibility of proceeding with DC cardioversion now that she has been on amiodarone. The patient would like to hold off on this for several months citing insurance concerns. Because her overall symptoms are improved I think he would be reasonable and would allow time for amiodarone to better reach steady state in her system.

## 2010-10-02 NOTE — Assessment & Plan Note (Signed)
She will continue a low-fat diet. She will continue medical therapy with low-dose Crestor and

## 2010-10-02 NOTE — Progress Notes (Signed)
HPI [name] returns today for followup. She is a very pleasant 76 year old woman with persistent symptomatic atrial fibrillation, symptomatic bradycardia, hypertension, and dyslipidemia. The patient has improved since starting amiodarone. Prior to this she had been on Tikosyn which was for the most part ineffective. We started the patient on amiodarone and her rate has improved and her dyspnea is less. The patient has been undergoing Coumadin therapy followup. She has had no syncope and denies peripheral edema. Allergies  Allergen Reactions  . Morphine Nausea Only     Current Outpatient Prescriptions  Medication Sig Dispense Refill  . ALPRAZolam (XANAX) 1 MG tablet Take 0.5 mg by mouth at bedtime as needed.       Marland Kitchen amiodarone (PACERONE) 200 MG tablet Take 1/2 tablet twice daily  60 tablet  6  . amLODipine (NORVASC) 5 MG tablet 1 tab daily      . Ascorbic Acid (VITAMIN C) 1000 MG tablet Take 1,000 mg by mouth 2 (two) times daily.       . Calcium Carbonate-Vit D-Min (CALCIUM 1200 PO) Take 1 capsule by mouth 2 (two) times daily.       . COD LIVER OIL PO Take 1 tablet by mouth daily.        . digoxin (LANOXIN) 0.125 MG tablet Take 125 mcg by mouth daily.        . fish oil-omega-3 fatty acids 1000 MG capsule Take 2 g by mouth daily.        Marland Kitchen LECITHIN PO Take by mouth as directed.        Marland Kitchen losartan (COZAAR) 50 MG tablet Take 50 mg by mouth daily.        . mupirocin (BACTROBAN) 2 % ointment       . Omega-3 Fatty Acids (FISH OIL) 1000 MG CAPS Take 1 capsule by mouth daily.        . rosuvastatin (CRESTOR) 5 MG tablet Take 5 mg by mouth daily.        Marland Kitchen warfarin (COUMADIN) 5 MG tablet Take 5 mg by mouth daily.           Past Medical History  Diagnosis Date  . Sick sinus syndrome     PAF in 8,9,10/2003,1/06;insignificant CAD in  08/2003  . Sick sinus syndrome     ddd pacing medtronic rhythm 05/2004   . Chronic anticoagulation     followed by PMD  . Hyperlipidemia   . Hypertension   .  Cerebrovascular disease     right carotid endarectomy in 1999;negative duplex in 06/2004  . Pneumonia     bilateral pneumonia 2010  . Hyperglycemia     fasting    ROS:   All systems reviewed and negative except as noted in the HPI.   Past Surgical History  Procedure Date  . Appendectomy   . Abdominal hysterectomy   . Right carotid endarectomy     1999  . Pacemaker placement     2006  . Colonoscopy     2006     No family history on file.   History   Social History  . Marital Status: Married    Spouse Name: N/A    Number of Children: N/A  . Years of Education: N/A   Occupational History  . Not on file.   Social History Main Topics  . Smoking status: Never Smoker   . Smokeless tobacco: Never Used  . Alcohol Use: No  . Drug Use: No  . Sexually Active: Not  on file   Other Topics Concern  . Not on file   Social History Narrative  . No narrative on file     BP 152/81  Pulse 90  Ht 5\' 5"  (1.651 m)  Wt 147 lb (66.679 kg)  BMI 24.46 kg/m2  Physical Exam:  Well appearing 76 year old woman, NAD HEENT: Unremarkable Neck:  No JVD, no thyromegally Lymphatics:  No adenopathy Back:  No CVA tenderness Lungs:  Clear. Well-healed pacemaker incision. HEART:  Iregular rate rhythm, no murmurs, no rubs, no clicks Abd:  soft, positive bowel sounds, no organomegally, no rebound, no guarding Ext:  2 plus pulses, no edema, no cyanosis, no clubbing Skin:  No rashes no nodules Neuro:  CN II through XII intact, motor grossly intact  DEVICE  Normal device function.  See PaceArt for details.   Assess/Plan:

## 2010-10-02 NOTE — Assessment & Plan Note (Signed)
Her device is working normally. We'll plan to recheck in several months. 

## 2010-10-09 ENCOUNTER — Ambulatory Visit (INDEPENDENT_AMBULATORY_CARE_PROVIDER_SITE_OTHER): Payer: Medicare Other | Admitting: *Deleted

## 2010-10-09 DIAGNOSIS — I4891 Unspecified atrial fibrillation: Secondary | ICD-10-CM

## 2010-10-09 DIAGNOSIS — Z7901 Long term (current) use of anticoagulants: Secondary | ICD-10-CM

## 2010-10-09 LAB — POCT INR: INR: 1.8

## 2010-10-18 ENCOUNTER — Other Ambulatory Visit: Payer: Self-pay | Admitting: *Deleted

## 2010-10-18 MED ORDER — DIGOXIN 125 MCG PO TABS: 125.0000 ug | ORAL_TABLET | Freq: Every day | ORAL | Status: DC

## 2010-11-14 ENCOUNTER — Encounter: Payer: Medicare Other | Admitting: *Deleted

## 2010-12-12 ENCOUNTER — Telehealth: Payer: Self-pay | Admitting: Internal Medicine

## 2010-12-12 NOTE — Telephone Encounter (Signed)
Making her very nervous, due to reading the side effects.  Had a spell last week where she got very nervous, has to hold things with both hand and feels nervous inside, can't sign a check is very shaky.  She has been on the Amiodarone since July  Wants to know if there is anything else she can try

## 2010-12-12 NOTE — Telephone Encounter (Signed)
Error

## 2010-12-12 NOTE — Telephone Encounter (Signed)
Returned call to patient and left message for her to return my call

## 2010-12-12 NOTE — Telephone Encounter (Signed)
New problem She is taking amiodorone. She has been having side effects. She is shaking a lot and is up at night. Please Call

## 2010-12-13 NOTE — Telephone Encounter (Signed)
Discussed with Dr Ladona Ridgel She is going to decrease her dose to 1/2 tablet daily  100mg  daily and it may take a couple of weeks but see if this helps with some of the side effects  She will call us back and let me know

## 2011-01-02 ENCOUNTER — Ambulatory Visit (INDEPENDENT_AMBULATORY_CARE_PROVIDER_SITE_OTHER): Payer: Medicare Other | Admitting: *Deleted

## 2011-01-02 DIAGNOSIS — I4891 Unspecified atrial fibrillation: Secondary | ICD-10-CM

## 2011-01-02 DIAGNOSIS — Z950 Presence of cardiac pacemaker: Secondary | ICD-10-CM

## 2011-01-02 DIAGNOSIS — I495 Sick sinus syndrome: Secondary | ICD-10-CM

## 2011-01-03 ENCOUNTER — Other Ambulatory Visit: Payer: Self-pay | Admitting: Internal Medicine

## 2011-01-03 ENCOUNTER — Encounter: Payer: Self-pay | Admitting: Internal Medicine

## 2011-01-09 ENCOUNTER — Encounter: Payer: Self-pay | Admitting: *Deleted

## 2011-01-09 LAB — REMOTE PACEMAKER DEVICE
AL AMPLITUDE: 2.8 mv
AL IMPEDENCE PM: 513 Ohm
BATTERY VOLTAGE: 2.78 V
RV LEAD AMPLITUDE: 22.4 mv

## 2011-01-14 NOTE — Progress Notes (Signed)
Remote pacer check  

## 2011-01-29 ENCOUNTER — Encounter: Payer: Self-pay | Admitting: *Deleted

## 2011-03-19 ENCOUNTER — Other Ambulatory Visit (HOSPITAL_COMMUNITY): Payer: Self-pay | Admitting: General Surgery

## 2011-03-19 DIAGNOSIS — Z139 Encounter for screening, unspecified: Secondary | ICD-10-CM

## 2011-03-24 ENCOUNTER — Ambulatory Visit (HOSPITAL_COMMUNITY)
Admission: RE | Admit: 2011-03-24 | Discharge: 2011-03-24 | Disposition: A | Discharge status: Home / Self Care | Payer: Medicare Other | Source: Ambulatory Visit | Attending: General Surgery | Admitting: General Surgery

## 2011-03-24 DIAGNOSIS — Z1231 Encounter for screening mammogram for malignant neoplasm of breast: Secondary | ICD-10-CM | POA: Insufficient documentation

## 2011-03-24 DIAGNOSIS — Z139 Encounter for screening, unspecified: Secondary | ICD-10-CM

## 2011-04-10 ENCOUNTER — Ambulatory Visit (INDEPENDENT_AMBULATORY_CARE_PROVIDER_SITE_OTHER): Payer: Medicare Other | Admitting: *Deleted

## 2011-04-10 ENCOUNTER — Encounter: Payer: Self-pay | Admitting: Internal Medicine

## 2011-04-10 DIAGNOSIS — I4891 Unspecified atrial fibrillation: Secondary | ICD-10-CM

## 2011-04-10 DIAGNOSIS — I495 Sick sinus syndrome: Secondary | ICD-10-CM

## 2011-04-11 LAB — REMOTE PACEMAKER DEVICE
AL IMPEDENCE PM: 492 Ohm
AL THRESHOLD: 0.5 V
ATRIAL PACING PM: 3
BAMS-0001: 175 {beats}/min
RV LEAD AMPLITUDE: 16 mv
RV LEAD THRESHOLD: 0.75 V

## 2011-04-18 ENCOUNTER — Other Ambulatory Visit: Payer: Self-pay

## 2011-04-18 MED ORDER — DIGOXIN 125 MCG PO TABS: 125.0000 ug | ORAL_TABLET | Freq: Every day | ORAL | Status: DC

## 2011-04-22 ENCOUNTER — Other Ambulatory Visit: Payer: Self-pay | Admitting: *Deleted

## 2011-04-22 MED ORDER — LOSARTAN POTASSIUM 50 MG PO TABS: 50.0000 mg | ORAL_TABLET | Freq: Every day | ORAL | Status: DC

## 2011-04-22 MED ORDER — AMLODIPINE BESYLATE 5 MG PO TABS: 5.0000 mg | ORAL_TABLET | Freq: Every day | ORAL | Status: DC

## 2011-04-24 NOTE — Progress Notes (Signed)
Remote pacer check  

## 2011-04-28 ENCOUNTER — Encounter: Payer: Self-pay | Admitting: *Deleted

## 2011-05-08 ENCOUNTER — Other Ambulatory Visit: Payer: Self-pay | Admitting: Cardiology

## 2011-05-08 MED ORDER — WARFARIN SODIUM 5 MG PO TABS: 5.0000 mg | ORAL_TABLET | Freq: Every day | ORAL | Status: DC

## 2011-05-08 NOTE — Telephone Encounter (Signed)
Called pt.  She is having coumadin managed at [facility].  Told her we would given her 1 refill but she needs to get new Rx for warfarin from them.  She is in agreement and has appt next week.

## 2011-05-08 NOTE — Telephone Encounter (Signed)
Is Dr. Regino Schultze following anti-coag now??

## 2011-05-14 ENCOUNTER — Other Ambulatory Visit (HOSPITAL_COMMUNITY): Payer: Self-pay | Admitting: Family Medicine

## 2011-05-14 DIAGNOSIS — Z139 Encounter for screening, unspecified: Secondary | ICD-10-CM

## 2011-05-21 ENCOUNTER — Ambulatory Visit (HOSPITAL_COMMUNITY)
Admission: RE | Admit: 2011-05-21 | Discharge: 2011-05-21 | Disposition: A | Discharge status: Home / Self Care | Payer: Medicare Other | Source: Ambulatory Visit | Attending: Family Medicine | Admitting: Family Medicine

## 2011-05-21 DIAGNOSIS — Z13820 Encounter for screening for osteoporosis: Secondary | ICD-10-CM | POA: Insufficient documentation

## 2011-05-21 DIAGNOSIS — Z780 Asymptomatic menopausal state: Secondary | ICD-10-CM | POA: Insufficient documentation

## 2011-05-21 DIAGNOSIS — Z139 Encounter for screening, unspecified: Secondary | ICD-10-CM

## 2011-06-11 ENCOUNTER — Telehealth: Payer: Self-pay | Admitting: Internal Medicine

## 2011-06-18 NOTE — Telephone Encounter (Signed)
TK

## 2011-07-17 ENCOUNTER — Encounter: Payer: Self-pay | Admitting: Internal Medicine

## 2011-07-17 ENCOUNTER — Ambulatory Visit (INDEPENDENT_AMBULATORY_CARE_PROVIDER_SITE_OTHER): Payer: Medicare Other | Admitting: *Deleted

## 2011-07-17 DIAGNOSIS — Z950 Presence of cardiac pacemaker: Secondary | ICD-10-CM

## 2011-07-17 DIAGNOSIS — I4891 Unspecified atrial fibrillation: Secondary | ICD-10-CM

## 2011-07-28 LAB — REMOTE PACEMAKER DEVICE
AL IMPEDENCE PM: 476 Ohm
BATTERY VOLTAGE: 2.78 V
BRDY-0002RV: 60 {beats}/min
BRDY-0003RV: 130 {beats}/min
BRDY-0004RV: 130 {beats}/min
VENTRICULAR PACING PM: 2

## 2011-08-13 ENCOUNTER — Other Ambulatory Visit (HOSPITAL_COMMUNITY): Payer: Self-pay | Admitting: General Surgery

## 2011-08-13 ENCOUNTER — Other Ambulatory Visit: Payer: Self-pay | Admitting: Internal Medicine

## 2011-08-13 DIAGNOSIS — R5383 Other fatigue: Secondary | ICD-10-CM

## 2011-08-13 MED ORDER — LOSARTAN POTASSIUM 50 MG PO TABS: 50.0000 mg | ORAL_TABLET | Freq: Every day | ORAL | Status: DC

## 2011-08-13 NOTE — Addendum Note (Signed)
**Note De-Identified Gennifer Potenza Obfuscation** Addended by: Demetrios Loll on: 08/13/2011 03:58 PM   Modules accepted: Orders

## 2011-08-13 NOTE — Telephone Encounter (Signed)
PLEASE CALL IN TO [facility] IN [place], DO NOT MAIL ORDER. SHE NEEDS IT FOR TODAY.

## 2011-08-15 ENCOUNTER — Ambulatory Visit (HOSPITAL_COMMUNITY)
Admission: RE | Admit: 2011-08-15 | Discharge: 2011-08-15 | Payer: Medicare Other | Source: Ambulatory Visit | Attending: General Surgery | Admitting: General Surgery

## 2011-08-19 ENCOUNTER — Ambulatory Visit (HOSPITAL_COMMUNITY)
Admission: RE | Admit: 2011-08-19 | Discharge: 2011-08-19 | Disposition: A | Discharge status: Home / Self Care | Payer: Medicare Other | Source: Ambulatory Visit | Attending: General Surgery | Admitting: General Surgery

## 2011-08-19 ENCOUNTER — Ambulatory Visit: Payer: Medicare Other | Admitting: Gastroenterology

## 2011-08-19 DIAGNOSIS — R5381 Other malaise: Secondary | ICD-10-CM | POA: Insufficient documentation

## 2011-08-19 DIAGNOSIS — I10 Essential (primary) hypertension: Secondary | ICD-10-CM | POA: Insufficient documentation

## 2011-08-19 DIAGNOSIS — J90 Pleural effusion, not elsewhere classified: Secondary | ICD-10-CM | POA: Insufficient documentation

## 2011-08-19 DIAGNOSIS — Z950 Presence of cardiac pacemaker: Secondary | ICD-10-CM | POA: Insufficient documentation

## 2011-08-19 DIAGNOSIS — R5383 Other fatigue: Secondary | ICD-10-CM

## 2011-08-19 DIAGNOSIS — R937 Abnormal findings on diagnostic imaging of other parts of musculoskeletal system: Secondary | ICD-10-CM | POA: Insufficient documentation

## 2011-08-19 MED ORDER — IOHEXOL 300 MG/ML  SOLN
100.0000 mL | Freq: Once | INTRAMUSCULAR | Status: AC | PRN
  Administered 2011-08-19: 100 mL via INTRAVENOUS

## 2011-08-20 ENCOUNTER — Other Ambulatory Visit (HOSPITAL_COMMUNITY): Payer: Self-pay | Admitting: Internal Medicine

## 2011-08-20 NOTE — Telephone Encounter (Signed)
.   Requested Prescriptions   Signed Prescriptions Disp Refills  . amLODipine (NORVASC) 5 MG tablet 90 tablet 3    Sig: TAKE 1 TABLET BY MOUTH EVERY DAY    Authorizing Provider: Dolores Patty    Ordering User: Lacie Scotts

## 2011-08-22 ENCOUNTER — Encounter: Payer: Self-pay | Admitting: *Deleted

## 2011-10-03 ENCOUNTER — Encounter: Payer: Self-pay | Admitting: Internal Medicine

## 2011-10-03 ENCOUNTER — Ambulatory Visit (INDEPENDENT_AMBULATORY_CARE_PROVIDER_SITE_OTHER): Payer: Medicare Other | Admitting: Internal Medicine

## 2011-10-03 VITALS — BP 147/84 | HR 78 | Ht 64.5 in | Wt 145.8 lb

## 2011-10-03 DIAGNOSIS — I495 Sick sinus syndrome: Secondary | ICD-10-CM

## 2011-10-03 DIAGNOSIS — Z950 Presence of cardiac pacemaker: Secondary | ICD-10-CM

## 2011-10-03 DIAGNOSIS — R5383 Other fatigue: Secondary | ICD-10-CM

## 2011-10-03 DIAGNOSIS — R5381 Other malaise: Secondary | ICD-10-CM

## 2011-10-03 DIAGNOSIS — I4891 Unspecified atrial fibrillation: Secondary | ICD-10-CM

## 2011-10-03 LAB — PACEMAKER DEVICE OBSERVATION
AL AMPLITUDE: 1.4 mv
AL IMPEDENCE PM: 484 Ohm
ATRIAL PACING PM: 4
BAMS-0001: 175 {beats}/min
BATTERY VOLTAGE: 2.78 V
BRDY-0002RV: 60 {beats}/min
BRDY-0003RV: 130 {beats}/min
BRDY-0004RV: 130 {beats}/min
RV LEAD AMPLITUDE: 8 mv
RV LEAD IMPEDENCE PM: 551 Ohm
RV LEAD THRESHOLD: 0.75 V
VENTRICULAR PACING PM: 3

## 2011-10-03 MED ORDER — NEBIVOLOL HCL 5 MG PO TABS: 5.0000 mg | ORAL_TABLET | Freq: Two times a day (BID) | ORAL | Status: DC

## 2011-10-03 NOTE — Assessment & Plan Note (Signed)
I suspect the etiology is multifactorial. I've recommended that the patient discontinue her amiodarone. For improved rate control, she will increase her Bystolic to 5 mg twice daily.

## 2011-10-03 NOTE — Assessment & Plan Note (Signed)
At this point we will abandon her strategy of rhythm control and try to obtain rate control. We will reserve AV node ablation.

## 2011-10-03 NOTE — Progress Notes (Signed)
HPI Mrs. Rothman returns today for followup. She is a very pleasant 77 year old woman with a history of symptomatic atrial fibrillation which is persistent, symptomatic tachybradycardia syndrome, status post pacemaker insertion. In the interim, she complains of fatigue and weakness. She admits to being sedentary. She does not have palpitations. She appears to have been out of rhythm for several weeks. She has been unable take more than 100 mg a day of amiodarone secondary to tremors and tremulousness. Allergies  Allergen Reactions  . Morphine Nausea Only     Current Outpatient Prescriptions  Medication Sig Dispense Refill  . ALPRAZolam (XANAX) 1 MG tablet Take 0.5 mg by mouth at bedtime as needed.       . Ascorbic Acid (VITAMIN C) 1000 MG tablet Take 1,000 mg by mouth 2 (two) times daily.       . Calcium Carbonate-Vit D-Min (CALCIUM 1200 PO) Take 1 capsule by mouth 2 (two) times daily.       . Cinnamon 500 MG TABS Take 500 mg by mouth daily.      . digoxin (LANOXIN) 0.125 MG tablet Take 1 tablet (125 mcg total) by mouth daily.  90 tablet  1  . fish oil-omega-3 fatty acids 1000 MG capsule Take 2 g by mouth daily.        Marland Kitchen LECITHIN PO Take 1,200 mg by mouth daily.       Marland Kitchen losartan (COZAAR) 50 MG tablet Take 1 tablet (50 mg total) by mouth daily.  90 tablet  1  . nebivolol (BYSTOLIC) 5 MG tablet Take 1 tablet (5 mg total) by mouth 2 (two) times daily.  60 tablet  11  . niacin 250 MG tablet Take 250 mg by mouth daily.      . Omega-3 Fatty Acids (FISH OIL) 1000 MG CAPS Take 1 capsule by mouth daily.        Marland Kitchen warfarin (COUMADIN) 5 MG tablet Take 1 tablet (5 mg total) by mouth daily.  30 tablet  0  . DISCONTD: nebivolol (BYSTOLIC) 5 MG tablet Take 5 mg by mouth daily.         Past Medical History  Diagnosis Date  . Sick sinus syndrome     PAF in 8,9,10/2003,1/06;insignificant CAD in  08/2003  . Sick sinus syndrome     ddd pacing medtronic rhythm 05/2004   . Chronic anticoagulation     followed  by PMD  . Hyperlipidemia   . Hypertension   . Cerebrovascular disease     right carotid endarectomy in 1999;negative duplex in 06/2004  . Pneumonia     bilateral pneumonia 2010  . Hyperglycemia     fasting    ROS:   All systems reviewed and negative except as noted in the HPI.   Past Surgical History  Procedure Date  . Appendectomy   . Abdominal hysterectomy   . Right carotid endarectomy     1999  . Pacemaker placement     2006  . Colonoscopy     2006     No family history on file.   History   Social History  . Marital Status: Married    Spouse Name: N/A    Number of Children: N/A  . Years of Education: N/A   Occupational History  . Not on file.   Social History Main Topics  . Smoking status: Never Smoker   . Smokeless tobacco: Never Used  . Alcohol Use: No  . Drug Use: No  . Sexually Active: Not  on file   Other Topics Concern  . Not on file   Social History Narrative  . No narrative on file     BP 147/84  Pulse 78  Ht 5' 4.5" (1.638 m)  Wt 145 lb 12.8 oz (66.134 kg)  BMI 24.64 kg/m2  Physical Exam:  Well appearing 77 year old woman, NAD HEENT: Unremarkable Neck:  No JVD, no thyromegally Lungs:  Clear with no wheezes, rales, or rhonchi HEART:  IRegular rate rhythm, no murmurs, no rubs, no clicks Abd:  soft, positive bowel sounds, no organomegally, no rebound, no guarding Ext:  2 plus pulses, no edema, no cyanosis, no clubbing Skin:  No rashes no nodules Neuro:  CN II through XII intact, motor grossly intact  DEVICE  Normal device function.  See PaceArt for details.   Assess/Plan:

## 2011-10-03 NOTE — Assessment & Plan Note (Signed)
Her device is working normally. Interrogation demonstrates a Medtronic dual-chamber pacemaker with 50% atrial fibrillation. We'll plan to recheck in several months.

## 2011-10-03 NOTE — Patient Instructions (Signed)
Your physician wants you to follow-up in: 12 months with Dr Court Joy will receive a reminder letter in the mail two months in advance. If you don't receive a letter, please call our office to schedule the follow-up appointment.    Remote monitoring is used to monitor your Pacemaker of ICD from home. This monitoring reduces the number of office visits required to check your device to one time per year. It allows [place] to keep an eye on the functioning of your device to ensure it is working properly. You are scheduled for a device check from home on 01/05/12. You may send your transmission at any time that day. If you have a wireless device, the transmission will be sent automatically. After your physician reviews your transmission, you will receive a postcard with your next transmission date.   Your physician has recommended you make the following change in your medication:  1) Stop Amiodarone 2) Increase Bystolic to 5mg  twice daily

## 2012-01-05 ENCOUNTER — Ambulatory Visit (INDEPENDENT_AMBULATORY_CARE_PROVIDER_SITE_OTHER): Payer: Medicare Other | Admitting: *Deleted

## 2012-01-05 ENCOUNTER — Encounter: Payer: Self-pay | Admitting: Internal Medicine

## 2012-01-05 DIAGNOSIS — Z950 Presence of cardiac pacemaker: Secondary | ICD-10-CM

## 2012-01-05 DIAGNOSIS — I4891 Unspecified atrial fibrillation: Secondary | ICD-10-CM

## 2012-01-06 ENCOUNTER — Telehealth: Payer: Self-pay | Admitting: Internal Medicine

## 2012-01-06 NOTE — Telephone Encounter (Signed)
PT HAS QUESTIONS ABOUT TRANSMITTING ON HER MONITOR. IF SHE DOESN'T ANSWER PLEASE LEAVE MESSAGE

## 2012-01-06 NOTE — Telephone Encounter (Signed)
Message left for a return call.  If this is related to PPM or ICD, will need to be referred to pacer clinic in [place].

## 2012-01-08 NOTE — Telephone Encounter (Signed)
FYI Have left messages for patient.  Am assuming that this is related to remote pacer transmissions.

## 2012-01-14 LAB — REMOTE PACEMAKER DEVICE
AL IMPEDENCE PM: 465 Ohm
ATRIAL PACING PM: 11
BAMS-0001: 175 {beats}/min
BATTERY VOLTAGE: 2.77 V
VENTRICULAR PACING PM: 11

## 2012-01-19 ENCOUNTER — Other Ambulatory Visit: Payer: Self-pay | Admitting: Internal Medicine

## 2012-01-20 ENCOUNTER — Other Ambulatory Visit: Payer: Self-pay | Admitting: Cardiology

## 2012-01-20 MED ORDER — LOSARTAN POTASSIUM 50 MG PO TABS: 50.0000 mg | ORAL_TABLET | Freq: Every day | ORAL | Status: DC

## 2012-01-30 ENCOUNTER — Encounter: Payer: Self-pay | Admitting: *Deleted

## 2012-04-12 ENCOUNTER — Encounter: Payer: Medicare Other | Admitting: *Deleted

## 2012-04-15 ENCOUNTER — Ambulatory Visit (INDEPENDENT_AMBULATORY_CARE_PROVIDER_SITE_OTHER): Payer: Medicare Other | Admitting: *Deleted

## 2012-04-15 ENCOUNTER — Encounter: Payer: Self-pay | Admitting: Internal Medicine

## 2012-04-15 ENCOUNTER — Other Ambulatory Visit: Payer: Self-pay | Admitting: Internal Medicine

## 2012-04-15 DIAGNOSIS — I4891 Unspecified atrial fibrillation: Secondary | ICD-10-CM

## 2012-04-15 DIAGNOSIS — Z950 Presence of cardiac pacemaker: Secondary | ICD-10-CM

## 2012-04-17 ENCOUNTER — Encounter (HOSPITAL_COMMUNITY): Payer: Self-pay

## 2012-04-17 ENCOUNTER — Emergency Department (HOSPITAL_COMMUNITY): Payer: Medicare Other

## 2012-04-17 ENCOUNTER — Inpatient Hospital Stay (HOSPITAL_COMMUNITY)
Admission: EM | Admit: 2012-04-17 | Discharge: 2012-04-20 | DRG: 694 | Disposition: A | Discharge status: Home Health | Payer: Medicare Other | Attending: Internal Medicine | Admitting: Internal Medicine

## 2012-04-17 DIAGNOSIS — D72829 Elevated white blood cell count, unspecified: Secondary | ICD-10-CM | POA: Diagnosis present

## 2012-04-17 DIAGNOSIS — Z7901 Long term (current) use of anticoagulants: Secondary | ICD-10-CM

## 2012-04-17 DIAGNOSIS — F3289 Other specified depressive episodes: Secondary | ICD-10-CM | POA: Diagnosis present

## 2012-04-17 DIAGNOSIS — R197 Diarrhea, unspecified: Secondary | ICD-10-CM | POA: Diagnosis present

## 2012-04-17 DIAGNOSIS — F419 Anxiety disorder, unspecified: Secondary | ICD-10-CM | POA: Diagnosis present

## 2012-04-17 DIAGNOSIS — I4891 Unspecified atrial fibrillation: Secondary | ICD-10-CM | POA: Diagnosis present

## 2012-04-17 DIAGNOSIS — Z950 Presence of cardiac pacemaker: Secondary | ICD-10-CM

## 2012-04-17 DIAGNOSIS — N1330 Unspecified hydronephrosis: Principal | ICD-10-CM | POA: Diagnosis present

## 2012-04-17 DIAGNOSIS — N134 Hydroureter: Secondary | ICD-10-CM

## 2012-04-17 DIAGNOSIS — N390 Urinary tract infection, site not specified: Secondary | ICD-10-CM

## 2012-04-17 DIAGNOSIS — N135 Crossing vessel and stricture of ureter without hydronephrosis: Secondary | ICD-10-CM

## 2012-04-17 DIAGNOSIS — F0390 Unspecified dementia without behavioral disturbance: Secondary | ICD-10-CM | POA: Diagnosis present

## 2012-04-17 DIAGNOSIS — N132 Hydronephrosis with renal and ureteral calculous obstruction: Secondary | ICD-10-CM | POA: Diagnosis present

## 2012-04-17 DIAGNOSIS — T50995A Adverse effect of other drugs, medicaments and biological substances, initial encounter: Secondary | ICD-10-CM | POA: Diagnosis present

## 2012-04-17 DIAGNOSIS — F329 Major depressive disorder, single episode, unspecified: Secondary | ICD-10-CM | POA: Diagnosis present

## 2012-04-17 DIAGNOSIS — F411 Generalized anxiety disorder: Secondary | ICD-10-CM | POA: Diagnosis present

## 2012-04-17 DIAGNOSIS — E785 Hyperlipidemia, unspecified: Secondary | ICD-10-CM | POA: Diagnosis present

## 2012-04-17 DIAGNOSIS — R441 Visual hallucinations: Secondary | ICD-10-CM | POA: Diagnosis present

## 2012-04-17 DIAGNOSIS — N201 Calculus of ureter: Secondary | ICD-10-CM | POA: Diagnosis present

## 2012-04-17 DIAGNOSIS — F32A Depression, unspecified: Secondary | ICD-10-CM | POA: Diagnosis present

## 2012-04-17 DIAGNOSIS — I10 Essential (primary) hypertension: Secondary | ICD-10-CM | POA: Diagnosis present

## 2012-04-17 DIAGNOSIS — R350 Frequency of micturition: Secondary | ICD-10-CM | POA: Diagnosis present

## 2012-04-17 HISTORY — DX: Unspecified osteoarthritis, unspecified site: M19.90

## 2012-04-17 LAB — PROTIME-INR
INR: 1.88 — ABNORMAL HIGH (ref 0.00–1.49)
Prothrombin Time: 20.9 seconds — ABNORMAL HIGH (ref 11.6–15.2)

## 2012-04-17 LAB — URINALYSIS, ROUTINE W REFLEX MICROSCOPIC
Bilirubin Urine: NEGATIVE
Ketones, ur: NEGATIVE mg/dL
Nitrite: NEGATIVE
Protein, ur: 100 mg/dL — AB
pH: 6.5 (ref 5.0–8.0)

## 2012-04-17 LAB — OCCULT BLOOD, POC DEVICE: Fecal Occult Bld: NEGATIVE

## 2012-04-17 LAB — CBC WITH DIFFERENTIAL/PLATELET
Eosinophils Absolute: 0.8 10*3/uL — ABNORMAL HIGH (ref 0.0–0.7)
Eosinophils Relative: 5 % (ref 0–5)
Hemoglobin: 14 g/dL (ref 12.0–15.0)
Lymphs Abs: 3.2 10*3/uL (ref 0.7–4.0)
MCH: 32.5 pg (ref 26.0–34.0)
MCHC: 34.3 g/dL (ref 30.0–36.0)
MCV: 94.7 fL (ref 78.0–100.0)
Monocytes Relative: 8 % (ref 3–12)
Platelets: 256 10*3/uL (ref 150–400)
RBC: 4.31 MIL/uL (ref 3.87–5.11)

## 2012-04-17 LAB — COMPREHENSIVE METABOLIC PANEL
BUN: 16 mg/dL (ref 6–23)
Calcium: 10.7 mg/dL — ABNORMAL HIGH (ref 8.4–10.5)
GFR calc Af Amer: 66 mL/min — ABNORMAL LOW (ref >90)
Glucose, Bld: 103 mg/dL — ABNORMAL HIGH (ref 70–99)
Total Protein: 6.8 g/dL (ref 6.0–8.3)

## 2012-04-17 LAB — LIPASE, BLOOD: Lipase: 61 U/L — ABNORMAL HIGH (ref 11–59)

## 2012-04-17 LAB — LACTIC ACID, PLASMA: Lactic Acid, Venous: 1.9 mmol/L (ref 0.5–2.2)

## 2012-04-17 MED ORDER — DEXTROSE 5 % IV SOLN
1.0000 g | Freq: Once | INTRAVENOUS | Status: AC
  Administered 2012-04-17: 1 g via INTRAVENOUS
  Filled 2012-04-17: qty 10

## 2012-04-17 MED ORDER — POTASSIUM CHLORIDE IN NACL 20-0.9 MEQ/L-% IV SOLN
INTRAVENOUS | Status: DC
  Administered 2012-04-18: 100 mL/h via INTRAVENOUS
  Administered 2012-04-18 – 2012-04-19 (×2): via INTRAVENOUS
  Filled 2012-04-17 (×7): qty 1000

## 2012-04-17 MED ORDER — SODIUM CHLORIDE 0.9 % IV SOLN
INTRAVENOUS | Status: DC
  Administered 2012-04-17: 15:00:00 via INTRAVENOUS

## 2012-04-17 MED ORDER — TRAZODONE 25 MG HALF TABLET
25.0000 mg | ORAL_TABLET | Freq: Every evening | ORAL | Status: DC | PRN
  Administered 2012-04-18 (×2): 25 mg via ORAL
  Filled 2012-04-17 (×3): qty 1

## 2012-04-17 MED ORDER — OMEGA-3 FATTY ACIDS 1000 MG PO CAPS: 1.0000 g | ORAL_CAPSULE | Freq: Every morning | ORAL | Status: DC

## 2012-04-17 MED ORDER — OMEGA-3-ACID ETHYL ESTERS 1 G PO CAPS
1.0000 g | ORAL_CAPSULE | Freq: Every day | ORAL | Status: DC
  Administered 2012-04-18 – 2012-04-20 (×3): 1 g via ORAL
  Filled 2012-04-17 (×5): qty 1

## 2012-04-17 MED ORDER — NIACIN 250 MG PO TABS
250.0000 mg | ORAL_TABLET | Freq: Every day | ORAL | Status: DC
  Administered 2012-04-18 – 2012-04-20 (×3): 250 mg via ORAL
  Filled 2012-04-17 (×5): qty 1

## 2012-04-17 MED ORDER — SODIUM CHLORIDE 0.9 % IJ SOLN
3.0000 mL | Freq: Two times a day (BID) | INTRAMUSCULAR | Status: DC
  Administered 2012-04-17: 3 mL via INTRAVENOUS

## 2012-04-17 MED ORDER — HYDROMORPHONE HCL PF 1 MG/ML IJ SOLN: 0.5000 mg | INTRAMUSCULAR | Status: DC | PRN

## 2012-04-17 MED ORDER — LOSARTAN POTASSIUM 50 MG PO TABS
50.0000 mg | ORAL_TABLET | Freq: Every morning | ORAL | Status: DC
  Administered 2012-04-18 – 2012-04-20 (×3): 50 mg via ORAL
  Filled 2012-04-17 (×5): qty 1

## 2012-04-17 MED ORDER — IOHEXOL 300 MG/ML  SOLN
50.0000 mL | Freq: Once | INTRAMUSCULAR | Status: AC | PRN
  Administered 2012-04-17: 50 mL via ORAL

## 2012-04-17 MED ORDER — AMLODIPINE BESYLATE 5 MG PO TABS: 5.0000 mg | ORAL_TABLET | Freq: Every day | ORAL | Status: DC

## 2012-04-17 MED ORDER — AMIODARONE HCL 200 MG PO TABS
200.0000 mg | ORAL_TABLET | Freq: Two times a day (BID) | ORAL | Status: DC
  Administered 2012-04-18 – 2012-04-20 (×6): 200 mg via ORAL
  Filled 2012-04-17 (×9): qty 1

## 2012-04-17 MED ORDER — ONDANSETRON HCL 4 MG/2ML IJ SOLN
4.0000 mg | INTRAMUSCULAR | Status: DC | PRN
  Administered 2012-04-20: 4 mg via INTRAVENOUS
  Filled 2012-04-17 (×2): qty 2

## 2012-04-17 MED ORDER — IOHEXOL 300 MG/ML  SOLN
100.0000 mL | Freq: Once | INTRAMUSCULAR | Status: AC | PRN
  Administered 2012-04-17: 100 mL via INTRAVENOUS

## 2012-04-17 MED ORDER — DIGOXIN 125 MCG PO TABS
125.0000 ug | ORAL_TABLET | Freq: Every morning | ORAL | Status: DC
  Administered 2012-04-18 – 2012-04-20 (×3): 125 ug via ORAL
  Filled 2012-04-17 (×3): qty 1

## 2012-04-17 MED ORDER — AMLODIPINE BESYLATE 5 MG PO TABS
5.0000 mg | ORAL_TABLET | Freq: Every day | ORAL | Status: DC
  Administered 2012-04-18 – 2012-04-20 (×3): 5 mg via ORAL
  Filled 2012-04-17 (×3): qty 1

## 2012-04-17 MED ORDER — NEBIVOLOL HCL 5 MG PO TABS
5.0000 mg | ORAL_TABLET | Freq: Every morning | ORAL | Status: DC
  Administered 2012-04-18 – 2012-04-20 (×3): 5 mg via ORAL
  Filled 2012-04-17 (×5): qty 1

## 2012-04-17 MED ORDER — LOPERAMIDE HCL 2 MG PO CAPS
4.0000 mg | ORAL_CAPSULE | Freq: Once | ORAL | Status: AC
  Administered 2012-04-17: 4 mg via ORAL
  Filled 2012-04-17: qty 2

## 2012-04-17 MED ORDER — ACETAMINOPHEN 325 MG PO TABS: 650.0000 mg | ORAL_TABLET | ORAL | Status: DC | PRN

## 2012-04-17 MED ORDER — LOPERAMIDE HCL 2 MG PO CAPS: 2.0000 mg | ORAL_CAPSULE | ORAL | Status: DC | PRN

## 2012-04-17 NOTE — ED Notes (Signed)
Pt c/o hemorrhoids x 2 weeks.  Also reports diarrhea and burning with urination.

## 2012-04-17 NOTE — ED Provider Notes (Signed)
History     CSN: [id number]  Arrival date & time 04/17/12  1407   First MD Initiated Contact with Patient 04/17/12 1424      Chief Complaint  Patient presents with  . Diarrhea  . Dysuria     HPI Pt was seen at 1440.  Per pt, c/o gradual onset and persistence of multiple intermittent episodes of diarrhea that began 2 to 3 weeks ago.   Describes the stools as "watery," "dark" and "black." Has been associated with intermittently bleeding tender hemorrhoids for the past 2 weeks.  Pt states she has a hx of same.  Pt also c/o gradual onset and persistence of constant dysuria that began last night. Pt describes her symptoms as "burning" and "hurts to urinate."  Denies hematuria, no vaginal bleeding, no abd pain, no CP/SOB, no back pain, no fevers, no blood in stools.     Past Medical History  Diagnosis Date  . Sick sinus syndrome     PAF in 8,9,10/2003,1/06;insignificant CAD in  08/2003  . Sick sinus syndrome     ddd pacing medtronic rhythm 05/2004   . Chronic anticoagulation     followed by PMD  . Hyperlipidemia   . Hypertension   . Cerebrovascular disease     right carotid endarectomy in 1999;negative duplex in 06/2004  . Pneumonia     bilateral pneumonia 2010  . Hyperglycemia     fasting    Past Surgical History  Procedure Laterality Date  . Appendectomy    . Abdominal hysterectomy    . Right carotid endarectomy      1999  . Pacemaker placement      2006  . Colonoscopy      2006     History  Substance Use Topics  . Smoking status: Never Smoker   . Smokeless tobacco: Never Used  . Alcohol Use: No    Review of Systems ROS: Statement: All systems negative except as marked or noted in the HPI; Constitutional: Negative for fever and chills. ; ; Eyes: Negative for eye pain, redness and discharge. ; ; ENMT: Negative for ear pain, hoarseness, nasal congestion, sinus pressure and sore throat. ; ; Cardiovascular: Negative for chest pain, palpitations, diaphoresis, dyspnea  and peripheral edema. ; ; Respiratory: Negative for cough, wheezing and stridor. ; ; Gastrointestinal: Negative for nausea, vomiting, abdominal pain, blood in stool, hematemesis, jaundice and +diarrhea, +dark/black stools, +rectal bleeding. . ; ; Genitourinary: +dysuria. Negative for flank pain and hematuria. ; ; Musculoskeletal: Negative for back pain and neck pain. Negative for swelling and trauma.; ; Skin: Negative for pruritus, rash, abrasions, blisters, bruising and skin lesion.; ; Neuro: Negative for headache, lightheadedness and neck stiffness. Negative for weakness, altered level of consciousness , altered mental status, extremity weakness, paresthesias, involuntary movement, seizure and syncope.       Allergies  Morphine and Penicillins  Home Medications   Current Outpatient Rx  Name  Route  Sig  Dispense  Refill  . ALPRAZolam (XANAX) 1 MG tablet   Oral   Take 0.5 mg by mouth at bedtime as needed.          . Ascorbic Acid (VITAMIN C) 1000 MG tablet   Oral   Take 1,000 mg by mouth 2 (two) times daily.          . Calcium Carbonate-Vit D-Min (CALCIUM 1200 PO)   Oral   Take 1 capsule by mouth 2 (two) times daily.          Marland Kitchen  Cinnamon 500 MG TABS   Oral   Take 500 mg by mouth daily.         . digoxin (LANOXIN) 0.125 MG tablet   Oral   Take 1 tablet (125 mcg total) by mouth daily.   90 tablet   1   . fish oil-omega-3 fatty acids 1000 MG capsule   Oral   Take 2 g by mouth daily.           Marland Kitchen LECITHIN PO   Oral   Take 1,200 mg by mouth daily.          Marland Kitchen losartan (COZAAR) 50 MG tablet   Oral   Take 1 tablet (50 mg total) by mouth daily.   90 tablet   1   . nebivolol (BYSTOLIC) 5 MG tablet   Oral   Take 1 tablet (5 mg total) by mouth 2 (two) times daily.   60 tablet   11   . niacin 250 MG tablet   Oral   Take 250 mg by mouth daily.         Marland Kitchen warfarin (COUMADIN) 5 MG tablet   Oral   Take 1 tablet (5 mg total) by mouth daily.   30 tablet   0      BP 157/84  Pulse 71  Temp(Src) 98.2 F (36.8 C) (Oral)  Resp 18  Ht 5\' 5"  (1.651 m)  Wt 148 lb (67.132 kg)  BMI 24.63 kg/m2  SpO2 98%  Physical Exam 1445: Physical examination:  Nursing notes reviewed; Vital signs and O2 SAT reviewed;  Constitutional: Well developed, Well nourished, Well hydrated, In no acute distress; Head:  Normocephalic, atraumatic; Eyes: EOMI, PERRL, No scleral icterus; ENMT: Mouth and pharynx normal, Mucous membranes moist; Neck: Supple, Full range of motion, No lymphadenopathy; Cardiovascular: Irregular irregular rate and rhythm, No gallop; Respiratory: Breath sounds clear & equal bilaterally, No rales, rhonchi, wheezes.  Speaking full sentences with ease, Normal respiratory effort/excursion; Chest: Nontender, Movement normal; Abdomen: Soft, Nontender, Nondistended, Normal bowel sounds. Rectal exam performed w/permission of pt and ED RN chaperone present.  Anal tone normal.  Non-tender, soft brown stool in rectal vault, heme neg.  No fissures, +tender external hemorrhoids without thrombosis or active bleeding.;; Genitourinary: No CVA tenderness; Extremities: Pulses normal, No tenderness, No edema, No calf edema or asymmetry.; Neuro: AA&Ox3, Major CN grossly intact.  Speech clear. No gross focal motor or sensory deficits in extremities.; Skin: Color normal, Warm, Dry.   ED Course  Procedures    MDM  MDM Reviewed: previous chart, nursing note and vitals Interpretation: labs and CT scan   Results for orders placed during the hospital encounter of 04/17/12  CLOSTRIDIUM DIFFICILE BY PCR      Result Value Range   C difficile by pcr NEGATIVE  NEGATIVE  URINALYSIS, ROUTINE W REFLEX MICROSCOPIC      Result Value Range   Color, Urine YELLOW  YELLOW   APPearance HAZY (*) CLEAR   Specific Gravity, Urine 1.025  1.005 - 1.030   pH 6.5  5.0 - 8.0   Glucose, UA NEGATIVE  NEGATIVE mg/dL   Hgb urine dipstick LARGE (*) NEGATIVE   Bilirubin Urine NEGATIVE  NEGATIVE    Ketones, ur NEGATIVE  NEGATIVE mg/dL   Protein, ur 956 (*) NEGATIVE mg/dL   Urobilinogen, UA 0.2  0.0 - 1.0 mg/dL   Nitrite NEGATIVE  NEGATIVE   Leukocytes, UA NEGATIVE  NEGATIVE  CBC WITH DIFFERENTIAL      Result Value  Range   WBC 15.5 (*) 4.0 - 10.5 K/uL   RBC 4.31  3.87 - 5.11 MIL/uL   Hemoglobin 14.0  12.0 - 15.0 g/dL   HCT 69.6  29.5 - 28.4 %   MCV 94.7  78.0 - 100.0 fL   MCH 32.5  26.0 - 34.0 pg   MCHC 34.3  30.0 - 36.0 g/dL   RDW 13.2  44.0 - 10.2 %   Platelets 256  150 - 400 K/uL   Neutrophils Relative 66  43 - 77 %   Neutro Abs 10.2 (*) 1.7 - 7.7 K/uL   Lymphocytes Relative 20  12 - 46 %   Lymphs Abs 3.2  0.7 - 4.0 K/uL   Monocytes Relative 8  3 - 12 %   Monocytes Absolute 1.2 (*) 0.1 - 1.0 K/uL   Eosinophils Relative 5  0 - 5 %   Eosinophils Absolute 0.8 (*) 0.0 - 0.7 K/uL   Basophils Relative 1  0 - 1 %   Basophils Absolute 0.1  0.0 - 0.1 K/uL  COMPREHENSIVE METABOLIC PANEL      Result Value Range   Sodium 139  135 - 145 mEq/L   Potassium 3.5  3.5 - 5.1 mEq/L   Chloride 101  96 - 112 mEq/L   CO2 26  19 - 32 mEq/L   Glucose, Bld 103 (*) 70 - 99 mg/dL   BUN 16  6 - 23 mg/dL   Creatinine, Ser 7.25  0.50 - 1.10 mg/dL   Calcium 36.6 (*) 8.4 - 10.5 mg/dL   Total Protein 6.8  6.0 - 8.3 g/dL   Albumin 3.4 (*) 3.5 - 5.2 g/dL   AST 28  0 - 37 U/L   ALT 33  0 - 35 U/L   Alkaline Phosphatase 58  39 - 117 U/L   Total Bilirubin 0.3  0.3 - 1.2 mg/dL   GFR calc non Af Amer 57 (*) >90 mL/min   GFR calc Af Amer 66 (*) >90 mL/min  LIPASE, BLOOD      Result Value Range   Lipase 61 (*) 11 - 59 U/L  LACTIC ACID, PLASMA      Result Value Range   Lactic Acid, Venous 1.9  0.5 - 2.2 mmol/L  PROTIME-INR      Result Value Range   Prothrombin Time 20.9 (*) 11.6 - 15.2 seconds   INR 1.88 (*) 0.00 - 1.49  URINE MICROSCOPIC-ADD ON      Result Value Range   Squamous Epithelial / LPF FEW (*) RARE   WBC, UA 7-10  <3 WBC/hpf   RBC / HPF 21-50  <3 RBC/hpf   Bacteria, UA FEW (*) RARE   OCCULT BLOOD, POC DEVICE      Result Value Range   Fecal Occult Bld NEGATIVE  NEGATIVE   Ct Abdomen Pelvis W Contrast 04/17/2012  *RADIOLOGY REPORT*  Clinical Data: Abdominal pain  CT ABDOMEN AND PELVIS WITH CONTRAST  Technique:  Multidetector CT imaging of the abdomen and pelvis was performed following the standard protocol during bolus administration of intravenous contrast.  Contrast: 50mL OMNIPAQUE IOHEXOL 300 MG/ML  SOLN, OMNIPAQUE IOHEXOL 300 MG/ML  SOLN  Comparison: CT 08/19/2011  Findings: Small left effusion and atelectasis or increased compared to prior.  Right lung bases clear.  Small pericardial effusion is present.  This is similar to prior.  No focal hepatic lesion.  Gallbladder, pancreas, adrenal glands, and spleen are normal.  There is hydronephrosis of the left  renal collecting system as well as hydroureter which is severe.  This appears to have worsened slightly in the interval.  This obstruction pattern is secondary to a large calculus within the distal left ureter.  The calculus is within the vesicoureteral junction measuring 7 mm (image 71). There is thickening about the vesicoureteral junction on the left. Right kidney is normal.  Delayed pyelogram phase imaging demonstrates no excretion into the left renal collecting system.  The stomach, small bowel, and colon unremarkable.  Abdominal aorta normal caliber.  No retroperitoneal periportal lymphadenopathy.  The no aggressive osseous lesions.  IMPRESSION:  1.  Severe hydronephrosis and hydroureter on the left secondary to an obstructing calculus within the left vesicoureteral junction. Recommend urology consultation.  2.  Hypertrophy tissue within the bladder wall adjacent to the obstructing calculus at the left vesicoureteral junction. 3.  Increasing left effusion and atelectasis. 4.  Stable pericardial effusion.   Original Report Authenticated By: Genevive Bi, M.D.     [phone]:  +UTI, UC pending.  Will start IV rocephin.  Pt  continues to have multiple diarrheal stools while in the ED.  Stool heme negative, and cdiff is negative.  Stool culture is pending.  Lipase elevated, but non-specific.  Dx and testing d/w pt and family.  Questions answered.  Verb understanding, agreeable to transfer/admit.  T/C to Urology Dr. Patsi Sears, case discussed, including:  HPI, pertinent PM/SHx, VS/PE, dx testing, ED course and treatment:  Agreeable to consult, requests to admit to Hospitalist service at [facility].   1850:  T/C to [facility] Dr. Irene Limbo, case discussed, including:  HPI, pertinent PM/SHx, VS/PE, dx testing, ED course and treatment:  States to [facility] directly.  1915:  T/C to [facility] Dr. Orvan Falconer, case discussed, including:  HPI, pertinent PM/SHx, VS/PE, dx testing, ED course and treatment:  States he assists with hospital transfers at this time, will come to ED for eval for transfer.      Laray Anger, DO 04/19/12 1312

## 2012-04-17 NOTE — H&P (Signed)
[facility] History and Physical  Traci [name]  ZOX:[id number]  DOB: September 17, 1934   DOA: 04/17/2012   PCP:   Debbe Mounts M.D. Cardiologist: Dr. Sharrell Ku  Chief Complaint:  Pain with urination since last night  HPI: Traci [name] is an 78 y.o. female.   Caucasian lady with tachybradycardia syndrome status post pacemaker placement, and atrial fibrillation on chronic Coumadin, and a remote history of kidney stones about 20 years ago. She reports she has not felt good for about 3 weeks with a feeling of fullness and pressure over her bladder. She visited an urgent care facility where a urinary tract infection was suspected and she was started on antibiotic. She did not improve so visited her primary care physician about 2 weeks ago, received a different antibiotic but her discomfort persisted until last night when she had sudden onset of severe lower abdominal pain with urination. She insists this is a pain rather than a burning. Additionally for the past 2 weeks she has had persistent watery green diarrhea, she reports about 20 times per day. Diarrhea sometimes has blood in it she says which she knows is from her hemorrhoids. She has had no fever or chills, no nausea or vomiting.  Because of the severe pain chronic diarrhea she came to the emergency room today where a CT scan of the abdomen was done and revealed left hydronephrosis with obstruction of the left ureterovesical junction by a calculus   C. difficile PCR in the emergency room was negative  Rewiew of Systems:   All systems negative except as marked bold or noted in the HPI;  Constitutional:    malaise, fever and chills. ;  Eyes:   eye pain, redness and discharge. ;  ENMT:   ear pain, hoarseness, nasal congestion, sinus pressure and sore throat. ;  Cardiovascular:    chest pain, palpitations, diaphoresis, dyspnea and peripheral edema.  Respiratory:   cough, hemoptysis, wheezing and stridor. ;  Gastrointestinal:  nausea,  vomiting, diarrhea, constipation, abdominal pain, melena, blood in stool, hematemesis, jaundice and rectal bleeding. unusual weight loss..   Genitourinary:    frequency, dysuria, incontinence,flank pain and hematuria; Musculoskeletal:   back pain and neck pain.  swelling and trauma.;  Skin: .  pruritus, rash, abrasions, bruising and skin lesion.; ulcerations Neuro:    headache, lightheadedness and neck stiffness.  weakness, altered level of consciousness, altered mental status, extremity weakness, burning feet, involuntary movement, seizure and syncope.  Psych:    anxiety, depression, insomnia, tearfulness, panic attacks, hallucinations, paranoia, suicidal or homicidal ideation  Patient's husband describes as a chronic worrier with severe anxiety; patient says she's has chronic hallucinations of rats and snakes in her home. She has no auditory hallucinations nor feelings of persecution. She denies a history of psychiatric management.  Past Medical History  Diagnosis Date  . Sick sinus syndrome     PAF in 8,9,10/2003,1/06;insignificant CAD in  08/2003  . Sick sinus syndrome     ddd pacing medtronic rhythm 05/2004   . Chronic anticoagulation     followed by PMD  . Hyperlipidemia   . Hypertension   . Cerebrovascular disease     right carotid endarectomy in 1999;negative duplex in 06/2004  . Pneumonia     bilateral pneumonia 2010  . Hyperglycemia     fasting    Past Surgical History  Procedure Laterality Date  . Appendectomy    . Abdominal hysterectomy    . Right carotid endarectomy  1999  . Pacemaker placement      2006  . Colonoscopy      2006    Medications:  HOME MEDS: Prior to Admission medications   Medication Sig Start Date End Date Taking? Authorizing Provider  amiodarone (PACERONE) 200 MG tablet Take 200 mg by mouth 2 (two) times daily.   Yes Historical Provider, MD  amLODipine (NORVASC) 5 MG tablet Take 5 mg by mouth daily.   Yes Historical Provider, MD  Ascorbic  Acid (VITAMIN C) 1000 MG tablet Take 1,000 mg by mouth 2 (two) times daily.    Yes Historical Provider, MD  Calcium Carbonate-Vit D-Min (CALCIUM 1200 PO) Take 1 capsule by mouth 2 (two) times daily.    Yes Historical Provider, MD  cholecalciferol (VITAMIN D) 400 UNITS TABS Take 400 Units by mouth every morning.   Yes Historical Provider, MD  digoxin (LANOXIN) 0.125 MG tablet Take 125 mcg by mouth every morning. 04/18/11  Yes Marinus Maw, MD  fish oil-omega-3 fatty acids 1000 MG capsule Take 1 g by mouth every morning.    Yes Historical Provider, MD  LECITHIN PO Take 1,200 mg by mouth daily.    Yes Historical Provider, MD  losartan (COZAAR) 50 MG tablet Take 50 mg by mouth every morning. 01/20/12  Yes Marinus Maw, MD  nebivolol (BYSTOLIC) 5 MG tablet Take 5 mg by mouth every morning. 10/03/11  Yes Marinus Maw, MD  niacin 250 MG tablet Take 250 mg by mouth daily.   Yes Historical Provider, MD  warfarin (COUMADIN) 5 MG tablet Take 2.5 mg by mouth every morning. 05/08/11  Yes Kathlen Brunswick, MD     Allergies:  Allergies  Allergen Reactions  . Morphine Nausea Only  . Penicillins     Social History:   reports that she has never smoked. She has never used smokeless tobacco. She reports that she does not drink alcohol or use illicit drugs.  Family History: No family history on file. Her mother died of an acute MI and her father of "hardening of the arteries." She has a sister with diabetes and hypertension; another sister with hypertension and 2 sisters with breast cancer.   Physical Exam: Filed Vitals:   04/17/12 1408 04/17/12 1700 04/17/12 2040  BP: 157/84 159/89 160/83  Pulse: 71 74 82  Temp: 98.2 F (36.8 C)    TempSrc: Oral    Resp: 18  18  Height: 5\' 5"  (1.651 m)    Weight: 67.132 kg (148 lb)    SpO2: 98% 93% 96%   Blood pressure 160/83, pulse 82, temperature 98.2 F (36.8 C), temperature source Oral, resp. rate 18, height 5\' 5"  (1.651 m), weight 67.132 kg (148 lb),  SpO2 96.00%.  GEN:  Pleasant elderly Caucasian lady lying in the stretcher; cooperative with exam PSYCH:  alert and oriented x4;  somewhat anxious; affect is appropriate. HEENT: Mucous membranes pink and anicteric; PERRLA; EOM intact; no cervical lymphadenopathy nor thyromegaly or carotid bruit; no JVD; Breasts:: Not examined CHEST WALL: No tenderness CHEST: Normal respiration, clear to auscultation bilaterally HEART: Regular rate and rhythm; no murmurs rubs or gallops BACK: No kyphosis no scoliosis; no CVA tenderness ABDOMEN:  soft non-tender; no masses, no organomegaly, normal abdominal bowel sounds; no pannus; no intertriginous candida. Rectal Exam: Not done EXTREMITIES:  age-appropriate arthropathy of the hands and knees; no edema; no ulcerations. Genitalia: not examined PULSES: 2+ and symmetric SKIN: Normal hydration no rash or ulceration CNS: Cranial nerves 2-12 grossly intact  no focal lateralizing neurologic deficit   Labs on Admission:  Basic Metabolic Panel:  Recent Labs Lab 04/17/12 1448  NA 139  K 3.5  CL 101  CO2 26  GLUCOSE 103*  BUN 16  CREATININE 0.95  CALCIUM 10.7*   Liver Function Tests:  Recent Labs Lab 04/17/12 1448  AST 28  ALT 33  ALKPHOS 58  BILITOT 0.3  PROT 6.8  ALBUMIN 3.4*    Recent Labs Lab 04/17/12 1448  LIPASE 61*   No results found for this basename: AMMONIA,  in the last 168 hours CBC:  Recent Labs Lab 04/17/12 1448  WBC 15.5*  NEUTROABS 10.2*  HGB 14.0  HCT 40.8  MCV 94.7  PLT 256   Cardiac Enzymes: No results found for this basename: CKTOTAL, CKMB, CKMBINDEX, TROPONINI,  in the last 168 hours BNP: No components found with this basename: POCBNP,  D-dimer: No components found with this basename: D-DIMER,  CBG: No results found for this basename: GLUCAP,  in the last 168 hours  Radiological Exams on Admission: Ct Abdomen Pelvis W Contrast  04/17/2012  *RADIOLOGY REPORT*  Clinical Data: Abdominal pain  CT ABDOMEN  AND PELVIS WITH CONTRAST  Technique:  Multidetector CT imaging of the abdomen and pelvis was performed following the standard protocol during bolus administration of intravenous contrast.  Contrast: 50mL OMNIPAQUE IOHEXOL 300 MG/ML  SOLN, OMNIPAQUE IOHEXOL 300 MG/ML  SOLN  Comparison: CT 08/19/2011  Findings: Small left effusion and atelectasis or increased compared to prior.  Right lung bases clear.  Small pericardial effusion is present.  This is similar to prior.  No focal hepatic lesion.  Gallbladder, pancreas, adrenal glands, and spleen are normal.  There is hydronephrosis of the left renal collecting system as well as hydroureter which is severe.  This appears to have worsened slightly in the interval.  This obstruction pattern is secondary to a large calculus within the distal left ureter.  The calculus is within the vesicoureteral junction measuring 7 mm (image 71). There is thickening about the vesicoureteral junction on the left. Right kidney is normal.  Delayed pyelogram phase imaging demonstrates no excretion into the left renal collecting system.  The stomach, small bowel, and colon unremarkable.  Abdominal aorta normal caliber.  No retroperitoneal periportal lymphadenopathy.  The no aggressive osseous lesions.  IMPRESSION:  1.  Severe hydronephrosis and hydroureter on the left secondary to an obstructing calculus within the left vesicoureteral junction. Recommend urology consultation.  2.  Hypertrophy tissue within the bladder wall adjacent to the obstructing calculus at the left vesicoureteral junction. 3.  Increasing left effusion and atelectasis. 4.  Stable pericardial effusion.   Original Report Authenticated By: Genevive Bi, M.D.     Assessment/Plan Present on Admission:   .Ureterovesical junction obstruction, left . Ureteral stone with hydronephrosis   Transfer to [facility] long for formal consult with Dr. Patsi Sears. Discharge patient will return to the care of her primary care  physician  . Diarrhea  Since C. difficile is negative, and empiric Imodium or diarrhea control. His diarrhea is likely brought on by her antibiotics; She does not appear toxic; if diarrhea is not improving quickly consider stool cultures possibly GI consult. Empiric hydration because of ongoing diarrhea   . Cardiac pacemaker in situ . Atrial fibrillation . SICK SINUS SYNDROME   continue warfarin per pharmacy for the time being onto continue warfarin for the time being onto the decision is taken by urologist in regard to stone management; continue digoxin for rate  control.   .. HYPERLIPIDEMIA . Anxiety and depression . Hallucinations, visual  Continue other chronic medications at home doses.  Psychiatric evaluation as an in or outpatient or hallucinations; likely medication effect     Other plans as per orders.  Code Status: FULL CODE  Family Communication:  husband at bedside  Disposition Plan:  per [facility] team    Ellyssa Zagal Nocturnist [facility] Pager [phone]   04/17/2012, 9:53 PM

## 2012-04-18 ENCOUNTER — Encounter (HOSPITAL_COMMUNITY): Payer: Self-pay | Admitting: Internal Medicine

## 2012-04-18 DIAGNOSIS — D72829 Elevated white blood cell count, unspecified: Secondary | ICD-10-CM | POA: Diagnosis present

## 2012-04-18 DIAGNOSIS — N390 Urinary tract infection, site not specified: Secondary | ICD-10-CM | POA: Diagnosis present

## 2012-04-18 DIAGNOSIS — N1330 Unspecified hydronephrosis: Secondary | ICD-10-CM | POA: Diagnosis present

## 2012-04-18 DIAGNOSIS — R197 Diarrhea, unspecified: Secondary | ICD-10-CM

## 2012-04-18 DIAGNOSIS — I4891 Unspecified atrial fibrillation: Secondary | ICD-10-CM

## 2012-04-18 LAB — URINE CULTURE

## 2012-04-18 LAB — DIGOXIN LEVEL: Digoxin Level: 0.5 ng/mL — ABNORMAL LOW (ref 0.8–2.0)

## 2012-04-18 MED ORDER — DEXTROSE 5 % IV SOLN
1.0000 g | INTRAVENOUS | Status: DC
  Administered 2012-04-18 – 2012-04-19 (×2): 1 g via INTRAVENOUS
  Filled 2012-04-18 (×3): qty 10

## 2012-04-18 NOTE — Consult Note (Signed)
Urology Consult  Referring physician: Dr. Elisabeth Pigeon, [facility]                                   Dr. Cecelia Byars, [place]                                    Dr. Malvin Johns, [place] Reason for referral:  L lower ureteral stone  Chief Complaint:  Abdominal pain, diarrhea, urinary frequency  History of Present Illness: Traci [name] is a 78 yo female transferred top [facility] from [facility] ER overnight for further Rx of chronic diarrhea and Atrial Fibrillation. She has been controlled on coumadin and is post pacemaker placement.   She is complaining of bladder fullness, dysuria, intemitttancy, incomplete voiding, but without flank pain or gross hematuria. She had CT scan at [facility], showing a 7mm calculus in the intramural ureter, at the u-v orifice. She has severe proximal hydroureteronephrosis. X-ray review with the Radiologist shows a prior x-ray,mentioning the hydronephrosis-a CT Chest from July, 2013, at [facility]. No further evaluation was accomplished, however.   Past Medical History  Diagnosis Date  . Sick sinus syndrome     PAF in 8,9,10/2003,1/06;insignificant CAD in  08/2003  . Sick sinus syndrome     ddd pacing medtronic rhythm 05/2004   . Chronic anticoagulation     followed by PMD  . Hyperlipidemia   . Hypertension   . Cerebrovascular disease     right carotid endarectomy in 1999;negative duplex in 06/2004  . Pneumonia     bilateral pneumonia 2010  . Hyperglycemia     fasting  . Coronary artery disease   . Arthritis    Past Surgical History  Procedure Laterality Date  . Appendectomy    . Abdominal hysterectomy    . Right carotid endarectomy      1999  . Pacemaker placement      2006  . Colonoscopy      2006    Medications: I have reviewed the patient's current medications. Allergies:  Allergies  Allergen Reactions  . Morphine Nausea Only  . Penicillins     Family History  Problem Relation Age of Onset  . Heart attack Mother      Died age 79  . Coronary artery disease Father   . Diabetes Sister   . Hypertension Sister   . Cancer Sister     Breast  . Cancer Sister     Breast   Social History:  reports that she has never smoked. She has never used smokeless tobacco. She reports that she does not drink alcohol or use illicit drugs.  ROS: All systems are reviewed and negative except as noted. systems negative except as marked bold or noted in the HPI;  Constitutional: malaise, fever and chills. ;  Eyes: eye pain, redness and discharge. ;  ENMT: ear pain, hoarseness, nasal congestion, sinus pressure and sore throat. ;  Cardiovascular: chest pain, palpitations, diaphoresis, dyspnea and peripheral edema.  Respiratory: cough, hemoptysis, wheezing and stridor. ;  Gastrointestinal: nausea, vomiting, diarrhea, constipation, abdominal pain, melena, blood in stool, hematemesis, jaundice and rectal bleeding. unusual weight loss..  Genitourinary: frequency, dysuria, incontinence,flank pain and hematuria;  Musculoskeletal: back pain and neck pain. swelling and trauma.;  Skin: . pruritus, rash, abrasions, bruising and skin lesion.; ulcerations  Neuro: headache, lightheadedness and  neck stiffness. weakness, altered level of consciousness, altered mental status, extremity weakness, burning feet, involuntary movement, seizure and syncope.  Psych: anxiety, depression, insomnia, tearfulness, panic attacks, hallucinations, paranoia, suicidal or homicidal ideation  Patient's husband describes as a chronic worrier with severe anxiety; patient says she's has chronic hallucinations of rats and snakes in her home. She has no auditory hallucinations nor feelings of persecution. She denies a history of psychiatric management.   Physical Exam:  Vital signs in last 24 hours: Temp:  [97.8 F (36.6 C)-98.2 F (36.8 C)] 97.9 F (36.6 C) (03/23 1340) Pulse Rate:  [72-104] 75 (03/23 1340) Resp:  [16-18] 17 (03/23 1340) BP:  (120-165)/(65-89) 137/68 mmHg (03/23 1340) SpO2:  [93 %-97 %] 97 % (03/23 1340) Weight:  [66.361 kg (146 lb 4.8 oz)] 66.361 kg (146 lb 4.8 oz) (03/22 2230)  Cardiovascular: Skin warm; not flushed Respiratory: Breaths quiet; no shortness of breath Abdomen: No masses. Negative CVA. Negative LLQ for pain.  Neurological: Normal sensation to touch Musculoskeletal: Normal motor function arms and legs Lymphatics: No inguinal adenopathy Skin: No rashes Genitourinary:wnl   Laboratory Data:  Results for orders placed during the hospital encounter of 04/17/12 (from the past 72 hour(s))  URINALYSIS, ROUTINE W REFLEX MICROSCOPIC     Status: Abnormal   Collection Time    04/17/12  2:28 PM      Result Value Range   Color, Urine YELLOW  YELLOW   APPearance HAZY (*) CLEAR   Specific Gravity, Urine 1.025  1.005 - 1.030   pH 6.5  5.0 - 8.0   Glucose, UA NEGATIVE  NEGATIVE mg/dL   Hgb urine dipstick LARGE (*) NEGATIVE   Bilirubin Urine NEGATIVE  NEGATIVE   Ketones, ur NEGATIVE  NEGATIVE mg/dL   Protein, ur 409 (*) NEGATIVE mg/dL   Urobilinogen, UA 0.2  0.0 - 1.0 mg/dL   Nitrite NEGATIVE  NEGATIVE   Leukocytes, UA NEGATIVE  NEGATIVE  URINE MICROSCOPIC-ADD ON     Status: Abnormal   Collection Time    04/17/12  2:28 PM      Result Value Range   Squamous Epithelial / LPF FEW (*) RARE   WBC, UA 7-10  <3 WBC/hpf   RBC / HPF 21-50  <3 RBC/hpf   Bacteria, UA FEW (*) RARE  OCCULT BLOOD, POC DEVICE     Status: None   Collection Time    04/17/12  2:45 PM      Result Value Range   Fecal Occult Bld NEGATIVE  NEGATIVE  CBC WITH DIFFERENTIAL     Status: Abnormal   Collection Time    04/17/12  2:48 PM      Result Value Range   WBC 15.5 (*) 4.0 - 10.5 K/uL   RBC 4.31  3.87 - 5.11 MIL/uL   Hemoglobin 14.0  12.0 - 15.0 g/dL   HCT 81.1  91.4 - 78.2 %   MCV 94.7  78.0 - 100.0 fL   MCH 32.5  26.0 - 34.0 pg   MCHC 34.3  30.0 - 36.0 g/dL   RDW 95.6  21.3 - 08.6 %   Platelets 256  150 - 400 K/uL    Neutrophils Relative 66  43 - 77 %   Neutro Abs 10.2 (*) 1.7 - 7.7 K/uL   Lymphocytes Relative 20  12 - 46 %   Lymphs Abs 3.2  0.7 - 4.0 K/uL   Monocytes Relative 8  3 - 12 %   Monocytes Absolute 1.2 (*) 0.1 -  1.0 K/uL   Eosinophils Relative 5  0 - 5 %   Eosinophils Absolute 0.8 (*) 0.0 - 0.7 K/uL   Basophils Relative 1  0 - 1 %   Basophils Absolute 0.1  0.0 - 0.1 K/uL  COMPREHENSIVE METABOLIC PANEL     Status: Abnormal   Collection Time    04/17/12  2:48 PM      Result Value Range   Sodium 139  135 - 145 mEq/L   Potassium 3.5  3.5 - 5.1 mEq/L   Chloride 101  96 - 112 mEq/L   CO2 26  19 - 32 mEq/L   Glucose, Bld 103 (*) 70 - 99 mg/dL   BUN 16  6 - 23 mg/dL   Creatinine, Ser 1.61  0.50 - 1.10 mg/dL   Calcium 09.6 (*) 8.4 - 10.5 mg/dL   Total Protein 6.8  6.0 - 8.3 g/dL   Albumin 3.4 (*) 3.5 - 5.2 g/dL   AST 28  0 - 37 U/L   ALT 33  0 - 35 U/L   Alkaline Phosphatase 58  39 - 117 U/L   Total Bilirubin 0.3  0.3 - 1.2 mg/dL   GFR calc non Af Amer 57 (*) >90 mL/min   GFR calc Af Amer 66 (*) >90 mL/min   Comment:            The eGFR has been calculated     using the CKD EPI equation.     This calculation has not been     validated in all clinical     situations.     eGFR's persistently     <90 mL/min signify     possible Chronic Kidney Disease.  LIPASE, BLOOD     Status: Abnormal   Collection Time    04/17/12  2:48 PM      Result Value Range   Lipase 61 (*) 11 - 59 U/L  PROTIME-INR     Status: Abnormal   Collection Time    04/17/12  2:48 PM      Result Value Range   Prothrombin Time 20.9 (*) 11.6 - 15.2 seconds   INR 1.88 (*) 0.00 - 1.49  BILIRUBIN, DIRECT     Status: None   Collection Time    04/17/12  2:48 PM      Result Value Range   Bilirubin, Direct <0.1  0.0 - 0.3 mg/dL  MAGNESIUM     Status: None   Collection Time    04/17/12  2:48 PM      Result Value Range   Magnesium 1.9  1.5 - 2.5 mg/dL  LACTIC ACID, PLASMA     Status: None   Collection Time     04/17/12  4:09 PM      Result Value Range   Lactic Acid, Venous 1.9  0.5 - 2.2 mmol/L  CLOSTRIDIUM DIFFICILE BY PCR     Status: None   Collection Time    04/17/12  6:00 PM      Result Value Range   C difficile by pcr NEGATIVE  NEGATIVE  DIGOXIN LEVEL     Status: Abnormal   Collection Time    04/18/12 12:05 AM      Result Value Range   Digoxin Level 0.5 (*) 0.8 - 2.0 ng/mL  TSH     Status: None   Collection Time    04/18/12 12:05 AM      Result Value Range   TSH 1.039  0.350 -  4.500 uIU/mL   Recent Results (from the past 240 hour(s))  CLOSTRIDIUM DIFFICILE BY PCR     Status: None   Collection Time    04/17/12  6:00 PM      Result Value Range Status   C difficile by pcr NEGATIVE  NEGATIVE Final   Creatinine:  Recent Labs  04/17/12 1448  CREATININE 0.95    Xrays: *RADIOLOGY REPORT*  Clinical Data: Abdominal pain  CT ABDOMEN AND PELVIS WITH CONTRAST  Technique: Multidetector CT imaging of the abdomen and pelvis was  performed following the standard protocol during bolus  administration of intravenous contrast.  Contrast: 50mL OMNIPAQUE IOHEXOL 300 MG/ML SOLN, OMNIPAQUE  IOHEXOL 300 MG/ML SOLN  Comparison: CT 08/19/2011  Findings: Small left effusion and atelectasis or increased compared  to prior. Right lung bases clear. Small pericardial effusion is  present. This is similar to prior.  No focal hepatic lesion. Gallbladder, pancreas, adrenal glands,  and spleen are normal.  There is hydronephrosis of the left renal collecting system as well  as hydroureter which is severe. This appears to have worsened  slightly in the interval. This obstruction pattern is secondary to  a large calculus within the distal left ureter. The calculus is  within the vesicoureteral junction measuring 7 mm (image 71).  There is thickening about the vesicoureteral junction on the left.  Right kidney is normal.  Delayed pyelogram phase imaging demonstrates no excretion into the  left  renal collecting system.  The stomach, small bowel, and colon unremarkable.  Abdominal aorta normal caliber. No retroperitoneal periportal  lymphadenopathy. The no aggressive osseous lesions.  IMPRESSION:  1. Severe hydronephrosis and hydroureter on the left secondary to  an obstructing calculus within the left vesicoureteral junction.  Recommend urology consultation.  2. Hypertrophy tissue within the bladder wall adjacent to the  obstructing calculus at the left vesicoureteral junction.  3. Increasing left effusion and atelectasis.  4. Stable pericardial effusion.  Original Report Authenticated By: Genevive Bi, M.D.    Impression/Assessment: Ureterovesical junction obstruction, left . Ureteral stone with hydronephrosis : I have reviewed films with radiology. Hydronephrosis was present ion July 2013 Chest CT, but no follow-up was accomp[lished. She now has increased hydronephrosis. She has left peripelvic cysts, which can cause some confusion with he diagnosis of hydronephrosis, however.    My concern is that the stone is inspisated in the ureteral wall, and may require laser fragmentation and prolonged JJ stent post op, with  Eventual Lasix renogram to ensure that she has regained renal function. She may need ureteral reimplant, however, if stone cannot be retrieved.    She will have cysto, retrograde pyelogram and attempted stone extraction tomorrow as "add-on". She will need JJ stent, and eventual Lasix renogram. I would expect her voiding symptoms to abate when the stone is successfully removed.  Will start flomax tonight to try to relax the ureteral orifice and pass stone, or make it easier to basket extract. Anticipate need for ureteral balloon dilation. .   . Diarrhea  Since C. difficile is negative, and empiric Imodium or diarrhea control. His diarrhea is likely brought on by her antibiotics; She does not appear toxic; if diarrhea is not improving quickly consider stool cultures  possibly GI consult. Empiric hydration because of ongoing diarrhea   . Cardiac pacemaker in situ . Atrial fibrillation  . SICK SINUS SYNDROME continue warfarin per pharmacy for the time being onto continue warfarin for the time being onto the decision is taken by  urologist in regard to stone management; continue digoxin for rate control.  .. HYPERLIPIDEMIA . Anxiety and depression . Hallucinations, visual  Continue other chronic medications at home doses.  Psychiatric evaluation as an in or outpatient or hallucinations; likely medication effect   Plan:   For OR tomorrow to remove R distal ureteral stone, if possible, and pass JJ stent.Marland Kitchen   Hermelinda Diegel I 04/18/2012, 3:40 PM

## 2012-04-18 NOTE — Progress Notes (Addendum)
ANTICOAGULATION CONSULT NOTE - Initial Consult  Pharmacy Consult for Warfarin Indication: atrial fibrillation  Allergies  Allergen Reactions  . Morphine Nausea Only  . Penicillins     Patient Measurements: Height: 5\' 5"  (165.1 cm) Weight: 146 lb 4.8 oz (66.361 kg) IBW/kg (Calculated) : 57  Vital Signs: Temp: 97.9 F (36.6 C) (03/23 0944) Temp src: Oral (03/23 0944) BP: 120/65 mmHg (03/23 0944) Pulse Rate: 73 (03/23 0944)  Labs:  Recent Labs  04/17/12 1448  HGB 14.0  HCT 40.8  PLT 256  LABPROT 20.9*  INR 1.88*  CREATININE 0.95    Estimated Creatinine Clearance: 46 ml/min (by C-G formula based on Cr of 0.95).   Medical History: Past Medical History  Diagnosis Date  . Sick sinus syndrome     PAF in 8,9,10/2003,1/06;insignificant CAD in  08/2003  . Sick sinus syndrome     ddd pacing medtronic rhythm 05/2004   . Chronic anticoagulation     followed by PMD  . Hyperlipidemia   . Hypertension   . Cerebrovascular disease     right carotid endarectomy in 1999;negative duplex in 06/2004  . Pneumonia     bilateral pneumonia 2010  . Hyperglycemia     fasting  . Coronary artery disease   . Arthritis     Assessment: 21 yoF with hx tachybrady syndrome s/p pacemaker placement and hx atrial fibrillation on chronic coumadin presents with painful urination, failed 2 courses abx outpatient.  Pt found to have left hydronephrosis with obstruction, tx'd to [facility] for urology consult.  Pharmacy asked to continue coumadin dosing.   Home dose per patient: Coumadin 2.5mg  po daily.  INR 3/22:  1.88.  CBC ok, no bleeding reported.    Goal of Therapy:  INR 2-3 Monitor platelets by anticoagulation protocol: Yes   Plan:  1.  Need to follow-up urology plans and when coumadin appropriate to re-start.   2.  F/u daily PT/INR, CBC, clinical course.    Rin Gorton E 04/18/2012,1:55 PM  Addendum: Spoke with Dr. Elisabeth Pigeon and will hold coumadin until urology sees patient and decides  if/when surgery will take place.    Haynes Hoehn, PharmD 04/18/2012 3:12 PM  Pager: [phone]

## 2012-04-18 NOTE — Progress Notes (Signed)
[facility] PROGRESS NOTE  JANITZA REVUELTA WUJ:[id number] DOB: 1934/11/09 DOA: 04/17/2012 PCP: Kirk Ruths, MD  Brief narrative: 78 year old female with past medical history of atrial fibrillation on coumadin, status post pacemaker placement, recently started on antibiotic for UTI. She presented to ED 04/17/12 with complaints of bladder fullness, burning sensation on urination. In addition, patient reported diarrhea. In ED, evaluation included CT abdomen which revealed 7 mm calculus in left vesicoureteral junction with left hydronephrosis.  Assessment/Plan:  Principal Problem:   *Hydronephrosis, left  Obstructing  renal stone, 7 mm  Appreciate urology consult  Start rocephin IV daily Active Problems:   UTI (urinary tract infection)  Continue rocephin    HYPERLIPIDEMIA  Continue niacin and Lovaza   Atrial fibrillation  Hold coumadin, possible procedure today or tomorrow  Continue amiodarone   Diarrhea  Antibiotic induced  Resolved   Leukocytosis, unspecified  Secondary to UTI   Code Status: full code Family Communication: no family at bedside Disposition Plan: home when stable  Manson Passey, MD  [facility] Pager [phone]  If 7PM-7AM, please contact night-coverage [email or web address] Password [email or web address] 04/18/2012, 2:05 PM   LOS: 1 day   Consultants:  Urology   Procedures:  None   Antibiotics:  None   HPI/Subjective: No acute overnight events.  Objective: Filed Vitals:   04/17/12 2230 04/18/12 0609 04/18/12 0944 04/18/12 1340  BP: 148/68 149/82 120/65 137/68  Pulse: 104 74 73 75  Temp: 97.8 F (36.6 C) 98.2 F (36.8 C) 97.9 F (36.6 C) 97.9 F (36.6 C)  TempSrc: Oral Oral Oral Oral  Resp: 16 16 16 17   Height: 5\' 5"  (1.651 m)     Weight: 66.361 kg (146 lb 4.8 oz)     SpO2: 96% 94% 96% 97%    Intake/Output Summary (Last 24 hours) at 04/18/12 1405 Last data filed at 04/18/12 1340  Gross per 24 hour  Intake 2195.51 ml  Output    350 ml  Net  1845.51 ml    Exam:   General:  Pt is alert, follows commands appropriately, not in acute distress  Cardiovascular: irregular rhythm, rate controlled, S1/S2 apprciated  Respiratory: Clear to auscultation bilaterally, no wheezing, no crackles  Abdomen: Soft, non tender, non distended, bowel sounds present, no guarding  Extremities: No edema, pulses DP and PT palpable bilaterally  Neuro: Grossly nonfocal  Data Reviewed: Basic Metabolic Panel:  Recent Labs Lab 04/17/12 1448  NA 139  K 3.5  CL 101  CO2 26  GLUCOSE 103*  BUN 16  CREATININE 0.95  CALCIUM 10.7*  MG 1.9   Liver Function Tests:  Recent Labs Lab 04/17/12 1448  AST 28  ALT 33  ALKPHOS 58  BILITOT 0.3  PROT 6.8  ALBUMIN 3.4*    Recent Labs Lab 04/17/12 1448  LIPASE 61*   No results found for this basename: AMMONIA,  in the last 168 hours CBC:  Recent Labs Lab 04/17/12 1448  WBC 15.5*  NEUTROABS 10.2*  HGB 14.0  HCT 40.8  MCV 94.7  PLT 256    CLOSTRIDIUM DIFFICILE BY PCR     Status: None   Collection Time    04/17/12  6:00 PM      Result Value Range Status   C difficile by pcr NEGATIVE  NEGATIVE Final     Studies: Ct Abdomen Pelvis W Contrast 04/17/2012    IMPRESSION:  1.  Severe hydronephrosis and hydroureter on the left secondary to an obstructing calculus within the left vesicoureteral junction. Recommend  urology consultation.  2.  Hypertrophy tissue within the bladder wall adjacent to the obstructing calculus at the left vesicoureteral junction. 3.  Increasing left effusion and atelectasis. 4.  Stable pericardial effusion.      Scheduled Meds: . amiodarone  200 mg Oral BID  . amLODipine  5 mg Oral Daily  . digoxin  125 mcg Oral q morning - 10a  . losartan  50 mg Oral q morning - 10a  . nebivolol  5 mg Oral q morning - 10a  . niacin  250 mg Oral Daily  . omega-3 acid ethyl e  1 g Oral Daily   Continuous Infusions: . 0.9 % NaCl with KCl 20 mEq / L 100 mL/hr at 04/18/12 1610

## 2012-04-18 NOTE — Progress Notes (Signed)
ANTIBIOTIC CONSULT NOTE - INITIAL  Pharmacy Consult for Ceftriaxone  Indication: UTI  Allergies  Allergen Reactions  . Morphine Nausea Only  . Penicillins     Patient Measurements: Height: 5\' 5"  (165.1 cm) Weight: 146 lb 4.8 oz (66.361 kg) IBW/kg (Calculated) : 57  Vital Signs: Temp: 97.9 F (36.6 C) (03/23 1340) Temp src: Oral (03/23 1340) BP: 137/68 mmHg (03/23 1340) Pulse Rate: 75 (03/23 1340) Intake/Output from previous day: 03/22 0701 - 03/23 0700 In: 545 [I.V.:545] Out: -  Intake/Output from this shift: Total I/O In: 1650.5 [P.O.:600; I.V.:1050.5] Out: 350 [Urine:350]  Labs:  Recent Labs  04/17/12 1448  WBC 15.5*  HGB 14.0  PLT 256  CREATININE 0.95   Estimated Creatinine Clearance: 46 ml/min (by C-G formula based on Cr of 0.95). No results found for this basename: VANCOTROUGH, Leodis Binet, VANCORANDOM, GENTTROUGH, GENTPEAK, GENTRANDOM, TOBRATROUGH, TOBRAPEAK, TOBRARND, AMIKACINPEAK, AMIKACINTROU, AMIKACIN,  in the last 72 hours   Microbiology: Recent Results (from the past 720 hour(s))  CLOSTRIDIUM DIFFICILE BY PCR     Status: None   Collection Time    04/17/12  6:00 PM      Result Value Range Status   C difficile by pcr NEGATIVE  NEGATIVE Final    Medical History: Past Medical History  Diagnosis Date  . Sick sinus syndrome     PAF in 8,9,10/2003,1/06;insignificant CAD in  08/2003  . Sick sinus syndrome     ddd pacing medtronic rhythm 05/2004   . Chronic anticoagulation     followed by PMD  . Hyperlipidemia   . Hypertension   . Cerebrovascular disease     right carotid endarectomy in 1999;negative duplex in 06/2004  . Pneumonia     bilateral pneumonia 2010  . Hyperglycemia     fasting  . Coronary artery disease   . Arthritis     Assessment: 58 YOF presents with UTI and uretal stone with hydronephrosis, transferred from [facility] for urology consult.  Pt was started on Rocephin last night at [facility].  Patient has penicillins listed as an allergy (no  reaction documented) but tolerated Rocephin.  Will continue Rocephin per MD request.  WBC elevated, afebrile.  Goal of Therapy:  Eradication of infection  Plan:  Ceftriaxone 1g IV q24h.  Clance Boll 04/18/2012,2:35 PM

## 2012-04-19 ENCOUNTER — Encounter (HOSPITAL_COMMUNITY)
Admission: EM | Disposition: A | Discharge status: Home Health | Payer: Self-pay | Source: Home / Self Care | Attending: Internal Medicine

## 2012-04-19 ENCOUNTER — Inpatient Hospital Stay (HOSPITAL_COMMUNITY): Payer: Medicare Other | Admitting: *Deleted

## 2012-04-19 ENCOUNTER — Encounter (HOSPITAL_COMMUNITY): Payer: Self-pay | Admitting: *Deleted

## 2012-04-19 DIAGNOSIS — F341 Dysthymic disorder: Secondary | ICD-10-CM

## 2012-04-19 HISTORY — PX: CYSTOSCOPY W/ URETERAL STENT PLACEMENT: SHX1429

## 2012-04-19 LAB — URINALYSIS, ROUTINE W REFLEX MICROSCOPIC
Glucose, UA: NEGATIVE mg/dL
Ketones, ur: NEGATIVE mg/dL
Leukocytes, UA: NEGATIVE
pH: 7.5 (ref 5.0–8.0)

## 2012-04-19 LAB — CBC
HCT: 37.9 % (ref 36.0–46.0)
HCT: 39.6 % (ref 36.0–46.0)
Hemoglobin: 12.9 g/dL (ref 12.0–15.0)
MCH: 32.3 pg (ref 26.0–34.0)
MCHC: 33.1 g/dL (ref 30.0–36.0)
RBC: 4 MIL/uL (ref 3.87–5.11)
RDW: 13.6 % (ref 11.5–15.5)

## 2012-04-19 LAB — COMPREHENSIVE METABOLIC PANEL
ALT: 20 U/L (ref 0–35)
AST: 16 U/L (ref 0–37)
CO2: 24 mEq/L (ref 19–32)
Chloride: 107 mEq/L (ref 96–112)
Creatinine, Ser: 0.74 mg/dL (ref 0.50–1.10)
GFR calc non Af Amer: 81 mL/min — ABNORMAL LOW (ref >90)
Glucose, Bld: 104 mg/dL — ABNORMAL HIGH (ref 70–99)
Sodium: 140 mEq/L (ref 135–145)
Total Bilirubin: 0.4 mg/dL (ref 0.3–1.2)

## 2012-04-19 LAB — CREATININE, SERUM: GFR calc non Af Amer: 83 mL/min — ABNORMAL LOW (ref >90)

## 2012-04-19 LAB — SURGICAL PCR SCREEN: MRSA, PCR: NEGATIVE

## 2012-04-19 LAB — URINE MICROSCOPIC-ADD ON

## 2012-04-19 SURGERY — CYSTOSCOPY, WITH RETROGRADE PYELOGRAM AND URETERAL STENT INSERTION
Anesthesia: General | Site: Ureter | Laterality: Left | Wound class: Clean Contaminated

## 2012-04-19 MED ORDER — ONDANSETRON HCL 4 MG/2ML IJ SOLN
INTRAMUSCULAR | Status: DC | PRN
  Administered 2012-04-19: 4 mg via INTRAVENOUS

## 2012-04-19 MED ORDER — BELLADONNA ALKALOIDS-OPIUM 16.2-60 MG RE SUPP
RECTAL | Status: DC | PRN
  Administered 2012-04-19: 1 via RECTAL

## 2012-04-19 MED ORDER — ACETAMINOPHEN 10 MG/ML IV SOLN
INTRAVENOUS | Status: DC | PRN
  Administered 2012-04-19: 1000 mg via INTRAVENOUS

## 2012-04-19 MED ORDER — SODIUM CHLORIDE 0.9 % IR SOLN
Status: DC | PRN
  Administered 2012-04-19: 3000 mL via INTRAVESICAL

## 2012-04-19 MED ORDER — SODIUM CHLORIDE 0.45 % IV SOLN
INTRAVENOUS | Status: DC
  Administered 2012-04-20 (×2): via INTRAVENOUS

## 2012-04-19 MED ORDER — DEXAMETHASONE SODIUM PHOSPHATE 4 MG/ML IJ SOLN
INTRAMUSCULAR | Status: DC | PRN
  Administered 2012-04-19: 10 mg via INTRAVENOUS

## 2012-04-19 MED ORDER — DIPHENHYDRAMINE HCL 50 MG/ML IJ SOLN: 12.5000 mg | Freq: Four times a day (QID) | INTRAMUSCULAR | Status: DC | PRN

## 2012-04-19 MED ORDER — KETOROLAC TROMETHAMINE 15 MG/ML IJ SOLN
15.0000 mg | Freq: Four times a day (QID) | INTRAMUSCULAR | Status: DC
  Administered 2012-04-19 – 2012-04-20 (×4): 15 mg via INTRAVENOUS
  Filled 2012-04-19 (×6): qty 1

## 2012-04-19 MED ORDER — MEPERIDINE HCL 50 MG/ML IJ SOLN: 6.2500 mg | INTRAMUSCULAR | Status: DC | PRN

## 2012-04-19 MED ORDER — BISACODYL 5 MG PO TBEC: 5.0000 mg | DELAYED_RELEASE_TABLET | Freq: Every day | ORAL | Status: DC | PRN

## 2012-04-19 MED ORDER — PHENYLEPHRINE HCL 10 MG/ML IJ SOLN
INTRAMUSCULAR | Status: DC | PRN
  Administered 2012-04-19: 40 ug via INTRAVENOUS

## 2012-04-19 MED ORDER — FENTANYL CITRATE 0.05 MG/ML IJ SOLN
INTRAMUSCULAR | Status: DC | PRN
  Administered 2012-04-19: 100 ug via INTRAVENOUS

## 2012-04-19 MED ORDER — PROMETHAZINE HCL 25 MG/ML IJ SOLN: 6.2500 mg | INTRAMUSCULAR | Status: DC | PRN

## 2012-04-19 MED ORDER — HYOSCYAMINE SULFATE 0.125 MG SL SUBL
0.1250 mg | SUBLINGUAL_TABLET | SUBLINGUAL | Status: DC | PRN
  Filled 2012-04-19: qty 1

## 2012-04-19 MED ORDER — KETOROLAC TROMETHAMINE 30 MG/ML IJ SOLN
INTRAMUSCULAR | Status: DC | PRN
  Administered 2012-04-19: 30 mg via INTRAVENOUS

## 2012-04-19 MED ORDER — LACTATED RINGERS IV SOLN: INTRAVENOUS | Status: DC

## 2012-04-19 MED ORDER — ZOLPIDEM TARTRATE 5 MG PO TABS
5.0000 mg | ORAL_TABLET | Freq: Every evening | ORAL | Status: DC | PRN
  Filled 2012-04-19: qty 1

## 2012-04-19 MED ORDER — ENOXAPARIN SODIUM 40 MG/0.4ML ~~LOC~~ SOLN
40.0000 mg | SUBCUTANEOUS | Status: DC
  Filled 2012-04-19 (×2): qty 0.4

## 2012-04-19 MED ORDER — IOHEXOL 300 MG/ML  SOLN
INTRAMUSCULAR | Status: DC | PRN
  Administered 2012-04-19: 7 mL via INTRAVENOUS

## 2012-04-19 MED ORDER — CIPROFLOXACIN HCL 500 MG PO TABS
500.0000 mg | ORAL_TABLET | Freq: Two times a day (BID) | ORAL | Status: DC
  Administered 2012-04-20: 500 mg via ORAL
  Filled 2012-04-19 (×3): qty 1

## 2012-04-19 MED ORDER — LACTATED RINGERS IV SOLN
INTRAVENOUS | Status: DC | PRN
  Administered 2012-04-19: 18:00:00 via INTRAVENOUS

## 2012-04-19 MED ORDER — CIPROFLOXACIN IN D5W 400 MG/200ML IV SOLN
400.0000 mg | INTRAVENOUS | Status: AC
  Administered 2012-04-19: 400 mg via INTRAVENOUS

## 2012-04-19 MED ORDER — DIPHENHYDRAMINE HCL 12.5 MG/5ML PO ELIX: 12.5000 mg | ORAL_SOLUTION | Freq: Four times a day (QID) | ORAL | Status: DC | PRN

## 2012-04-19 MED ORDER — PROPOFOL 10 MG/ML IV BOLUS
INTRAVENOUS | Status: DC | PRN
  Administered 2012-04-19: 150 mg via INTRAVENOUS

## 2012-04-19 MED ORDER — METOCLOPRAMIDE HCL 5 MG/ML IJ SOLN
INTRAMUSCULAR | Status: DC | PRN
  Administered 2012-04-19: 10 mg via INTRAVENOUS

## 2012-04-19 MED ORDER — FENTANYL CITRATE 0.05 MG/ML IJ SOLN: 25.0000 ug | INTRAMUSCULAR | Status: DC | PRN

## 2012-04-19 SURGICAL SUPPLY — 16 items
ADAPTER CATH URET PLST 4-6FR (CATHETERS) ×2 IMPLANT
ADPR CATH URET STRL DISP 4-6FR (CATHETERS) ×1
BAG URO CATCHER STRL LF (DRAPE) ×2 IMPLANT
CATH INTERMIT  6FR 70CM (CATHETERS) ×2 IMPLANT
CLOTH BEACON ORANGE TIMEOUT ST (SAFETY) ×2 IMPLANT
DRAPE CAMERA CLOSED 9X96 (DRAPES) ×2 IMPLANT
GLOVE BIOGEL M STRL SZ7.5 (GLOVE) ×2 IMPLANT
GOWN STRL NON-REIN LRG LVL3 (GOWN DISPOSABLE) ×2 IMPLANT
GOWN STRL REIN XL XLG (GOWN DISPOSABLE) ×2 IMPLANT
GUIDEWIRE STR DUAL SENSOR (WIRE) ×2 IMPLANT
MANIFOLD NEPTUNE II (INSTRUMENTS) ×2 IMPLANT
NS IRRIG 1000ML POUR BTL (IV SOLUTION) ×2 IMPLANT
PACK CYSTO (CUSTOM PROCEDURE TRAY) ×2 IMPLANT
SCRUB PCMX 4 OZ (MISCELLANEOUS) ×2 IMPLANT
STENT CONTOUR 6FRX24X.038 (STENTS) ×1 IMPLANT
TUBING CONNECTING 10 (TUBING) ×2 IMPLANT

## 2012-04-19 NOTE — Interval H&P Note (Signed)
History and Physical Interval Note:  04/19/2012 6:08 PM  Traci [name]  has presented today for surgery, with the diagnosis of Left distal ureteral stone  The various methods of treatment have been discussed with the patient and family. After consideration of risks, benefits and other options for treatment, the patient has consented to  Procedure(s): CYSTOSCOPY WITH RETROGRADE PYELOGRAM/URETERAL STENT PLACEMENT Possible Laser (Left) as a surgical intervention .  The patient's history has been reviewed, patient examined, no change in status, stable for surgery.  I have reviewed the patient's chart and labs.  Questions were answered to the patient's satisfaction.     Jethro Bolus I

## 2012-04-19 NOTE — Anesthesia Preprocedure Evaluation (Signed)
Anesthesia Evaluation  Patient identified by MRN, date of birth, ID band Patient awake    Reviewed: Allergy & Precautions, H&P , NPO status , Patient's Chart, lab work & pertinent test results  Airway Mallampati: II TM Distance: >3 FB Neck ROM: Full    Dental no notable dental hx.    Pulmonary neg pulmonary ROS,  breath sounds clear to auscultation  Pulmonary exam normal       Cardiovascular hypertension, Pt. on medications + CAD + dysrhythmias Atrial Fibrillation + pacemaker Rhythm:Regular Rate:Normal     Neuro/Psych negative neurological ROS  negative psych ROS   GI/Hepatic negative GI ROS, Neg liver ROS,   Endo/Other  negative endocrine ROS  Renal/GU negative Renal ROS  negative genitourinary   Musculoskeletal negative musculoskeletal ROS (+)   Abdominal   Peds negative pediatric ROS (+)  Hematology negative hematology ROS (+)   Anesthesia Other Findings   Reproductive/Obstetrics negative OB ROS                           Anesthesia Physical Anesthesia Plan  ASA: III  Anesthesia Plan: General   Post-op Pain Management:    Induction: Intravenous  Airway Management Planned: LMA  Additional Equipment:   Intra-op Plan:   Post-operative Plan: Extubation in OR  Informed Consent: I have reviewed the patients History and Physical, chart, labs and discussed the procedure including the risks, benefits and alternatives for the proposed anesthesia with the patient or authorized representative who has indicated his/her understanding and acceptance.   Dental advisory given  Plan Discussed with: CRNA  Anesthesia Plan Comments:         Anesthesia Quick Evaluation

## 2012-04-19 NOTE — Progress Notes (Signed)
Urology Progress Note  Day of Surgery   Subjective: No pain. No hematuria. Pt's Chest Ct reviewed with Radiology. She had hydronephrosis 1 1/2 yrs ago. She may have had stone and hydro for 1 1/2 years.  This means that the stone may be buried in the ureteral wall, and may not be able to be removed. She may need ureteral re-implantation.    No acute urologic events overnight. Ambulation:   positive Flatus:    positive Bowel movement  negative  Pain: complete resolution  Objective:  Blood pressure 165/79, pulse 56, temperature 98.3 F (36.8 C), temperature source Oral, resp. rate 16, height 5\' 5"  (1.651 m), weight 66.679 kg (147 lb), SpO2 96.00%.  Physical Exam: General:  No acute distress, awake Genitourinary:   normal Foley: none    I/O last 3 completed shifts: In: 4040.5 [P.O.:840; I.V.:3150.5; IV Piggyback:50] Out: 3950 [Urine:3950]  Recent Labs     04/17/12  1448  04/19/12  0754  HGB  14.0  12.9  WBC  15.5*  10.1  PLT  256  245    Recent Labs     04/17/12  1448  04/19/12  0754  NA  139  140  K  3.5  3.7  CL  101  107  CO2  26  24  BUN  16  11  CREATININE  0.95  0.74  CALCIUM  10.7*  8.6  GFRNONAA  57*  81*  GFRAA  66*  >90     Recent Labs     04/17/12  1448  04/19/12  0754  INR  1.88*  1.52*     No components found with this basename: ABG,   Assessment/Plan:  Continue any current medications. For OR tonight

## 2012-04-19 NOTE — Preoperative (Signed)
Beta Blockers   Reason not to administer Beta Blockers:Not Applicable, took BB this am 

## 2012-04-19 NOTE — Anesthesia Postprocedure Evaluation (Signed)
  Anesthesia Post-op Note  Patient: Traci [name]  Procedure(s) Performed: Procedure(s) (LRB): CYSTOSCOPY WITH RETROGRADE PYELOGRAM/URETERAL STENT PLACEMENT  (Left)  Patient Location: PACU  Anesthesia Type: General  Level of Consciousness: awake and alert   Airway and Oxygen Therapy: Patient Spontanous Breathing  Post-op Pain: mild  Post-op Assessment: Post-op Vital signs reviewed, Patient's Cardiovascular Status Stable, Respiratory Function Stable, Patent Airway and No signs of Nausea or vomiting  Last Vitals:  Filed Vitals:   04/19/12 1950  BP: 143/76  Pulse: 78  Temp: 36.7 C  Resp: 16    Post-op Vital Signs: stable   Complications: No apparent anesthesia complications

## 2012-04-19 NOTE — Progress Notes (Signed)
  Amiodarone Drug - Drug Interaction Consult Note  Amiodarone is metabolized by the cytochrome P450 system and therefore has the potential to cause many drug interactions. Amiodarone has an average plasma half-life of 50 days (range 20 to 100 days).   There is potential for drug interactions to occur several weeks or months after stopping treatment and the onset of drug interactions may be slow after initiating amiodarone.   []  Statins: Increased risk of myopathy. Simvastatin- restrict dose to 20mg  daily. Other statins: counsel patients to report any muscle pain or weakness immediately.  []  Anticoagulants: Amiodarone can increase anticoagulant effect. Consider warfarin dose reduction. Patients should be monitored closely and the dose of anticoagulant altered accordingly, remembering that amiodarone levels take several weeks to stabilize.  []  Antiepileptics: Amiodarone can increase plasma concentration of phenytoin, the dose should be reduced. Note that small changes in phenytoin dose can result in large changes in levels. Monitor patient and counsel on signs of toxicity.  []  Beta blockers: increased risk of bradycardia, AV block and myocardial depression. Sotalol - avoid concomitant use.  []   Calcium channel blockers (diltiazem and verapamil): increased risk of bradycardia, AV block and myocardial depression.  []   Cyclosporine: Amiodarone increases levels of cyclosporine. Reduced dose of cyclosporine is recommended.  []  Digoxin dose should be halved when amiodarone is started.  []  Diuretics: increased risk of cardiotoxicity if hypokalemia occurs.  []  Oral hypoglycemic agents (glyburide, glipizide, glimepiride): increased risk of hypoglycemia. Patient's glucose levels should be monitored closely when initiating amiodarone therapy.   [x]  Drugs that prolong the QT interval:  Torsades de pointes risk may be increased with concurrent use - avoid if possible.  Monitor QTc, also keep  magnesium/potassium WNL if concurrent therapy can't be avoided. Marland Kitchen Antibiotics: e.g. fluoroquinolones, erythromycin. . Antiarrhythmics: e.g. quinidine, procainamide, disopyramide, sotalol. . Antipsychotics: e.g. phenothiazines, haloperidol.  . Lithium, tricyclic antidepressants, and methadone.   Gwen Her PharmD  [phone] 04/19/2012 8:24 PM

## 2012-04-19 NOTE — Transfer of Care (Signed)
Immediate Anesthesia Transfer of Care Note  Patient: Traci [name]  Procedure(s) Performed: Procedure(s): CYSTOSCOPY WITH RETROGRADE PYELOGRAM/URETERAL STENT PLACEMENT  (Left)  Patient Location: PACU  Anesthesia Type:General  Level of Consciousness: awake, alert , oriented, patient cooperative and responds to stimulation  Airway & Oxygen Therapy: Patient Spontanous Breathing and Patient connected to face mask oxygen  Post-op Assessment: Report given to PACU RN, Post -op Vital signs reviewed and stable and Patient moving all extremities  Post vital signs: Reviewed and stable  Complications: No apparent anesthesia complications

## 2012-04-19 NOTE — H&P (View-Only) (Signed)
Urology Progress Note  Day of Surgery   Subjective: No pain. No hematuria. Pt's Chest Ct reviewed with Radiology. She had hydronephrosis 1 1/2 yrs ago. She may have had stone and hydro for 1 1/2 years.  This means that the stone may be buried in the ureteral wall, and may not be able to be removed. She may need ureteral re-implantation.    No acute urologic events overnight. Ambulation:   positive Flatus:    positive Bowel movement  negative  Pain: complete resolution  Objective:  Blood pressure 165/79, pulse 56, temperature 98.3 F (36.8 C), temperature source Oral, resp. rate 16, height 5' 5" (1.651 m), weight 66.679 kg (147 lb), SpO2 96.00%.  Physical Exam: General:  No acute distress, awake Genitourinary:   normal Foley: none    I/O last 3 completed shifts: In: 4040.5 [P.O.:840; I.V.:3150.5; IV Piggyback:50] Out: 3950 [Urine:3950]  Recent Labs     04/17/12  1448  04/19/12  0754  HGB  14.0  12.9  WBC  15.5*  10.1  PLT  256  245    Recent Labs     04/17/12  1448  04/19/12  0754  NA  139  140  K  3.5  3.7  CL  101  107  CO2  26  24  BUN  16  11  CREATININE  0.95  0.74  CALCIUM  10.7*  8.6  GFRNONAA  57*  81*  GFRAA  66*  >90     Recent Labs     04/17/12  1448  04/19/12  0754  INR  1.88*  1.52*     No components found with this basename: ABG,   Assessment/Plan:  Continue any current medications. For OR tonight      

## 2012-04-19 NOTE — Progress Notes (Signed)
[facility] PROGRESS NOTE  Traci [name] ZOX:[id number] DOB: 06/13/1934 DOA: 04/17/2012 PCP: Kirk Ruths, MD  Brief narrative: 78 year old female with past medical history of atrial fibrillation on coumadin, status post pacemaker placement, recently started on antibiotic for UTI. She presented to ED 04/17/12 with complaints of bladder fullness, burning sensation on urination. In addition, patient reported diarrhea.  In ED, evaluation included CT abdomen which revealed 7 mm calculus in left vesicoureteral junction with left hydronephrosis.   Assessment/Plan:   Principal Problem:  *Hydronephrosis, left  Obstructing renal stone, 7 mm  Appreciate urology consult - plan for procedure today Continue Rocephin Active Problems:  UTI (urinary tract infection)  Continue rocephin  HYPERLIPIDEMIA  Continue niacin and Lovaza Atrial fibrillation  Coumadin on hold in anticipation for procedure today Continue amiodarone Diarrhea  Antibiotic induced  Resolved Leukocytosis, unspecified  Secondary to UTI White blood cell count normalized   Code Status: full code  Family Communication: no family at bedside  Disposition Plan: home when stable    Manson Passey, MD  [facility]  Pager [phone]   Consultants:  Urology  Procedures:  None  Antibiotics:  None    If 7PM-7AM, please contact night-coverage [email or web address] Password [facility] 04/19/2012, 3:28 PM   LOS: 2 days    HPI/Subjective: No acute overnight events.  Objective: Filed Vitals:   04/19/12 0500 04/19/12 0548 04/19/12 1005 04/19/12 1400  BP:  141/86 145/95 165/79  Pulse:  71 64 56  Temp:  97.8 F (36.6 C) 97.5 F (36.4 C) 98.3 F (36.8 C)  TempSrc:  Oral Oral Oral  Resp:  18 16 16   Height:      Weight: 66.679 kg (147 lb)     SpO2:  95% 98% 96%    Intake/Output Summary (Last 24 hours) at 04/19/12 1528 Last data filed at 04/19/12 1500  Gross per 24 hour  Intake   2695 ml  Output   4600 ml  Net  -1905 ml     Exam:   General:  Pt is alert, follows commands appropriately, not in acute distress  Cardiovascular: Regular rate and rhythm, S1/S2, no murmurs, no rubs, no gallops  Respiratory: Clear to auscultation bilaterally, no wheezing, no crackles, no rhonchi  Abdomen: Soft, non tender, non distended, bowel sounds present, no guarding  Extremities: No edema, pulses DP and PT palpable bilaterally  Neuro: Grossly nonfocal  Data Reviewed: Basic Metabolic Panel:  Recent Labs Lab 04/17/12 1448 04/19/12 0754  NA 139 140  K 3.5 3.7  CL 101 107  CO2 26 24  GLUCOSE 103* 104*  BUN 16 11  CREATININE 0.95 0.74  CALCIUM 10.7* 8.6  MG 1.9  --    Liver Function Tests:  Recent Labs Lab 04/17/12 1448 04/19/12 0754  AST 28 16  ALT 33 20  ALKPHOS 58 49  BILITOT 0.3 0.4  PROT 6.8 6.6  ALBUMIN 3.4* 3.1*    Recent Labs Lab 04/17/12 1448  LIPASE 61*   No results found for this basename: AMMONIA,  in the last 168 hours CBC:  Recent Labs Lab 04/17/12 1448 04/19/12 0754  WBC 15.5* 10.1  NEUTROABS 10.2*  --   HGB 14.0 12.9  HCT 40.8 37.9  MCV 94.7 94.8  PLT 256 245   Cardiac Enzymes: No results found for this basename: CKTOTAL, CKMB, CKMBINDEX, TROPONINI,  in the last 168 hours BNP: No components found with this basename: POCBNP,  CBG: No results found for this basename: GLUCAP,  in the last 168  hours  Recent Results (from the past 240 hour(s))  URINE CULTURE     Status: None   Collection Time    04/17/12  2:29 PM      Result Value Range Status   Specimen Description URINE, CLEAN CATCH   Final   Special Requests NONE   Final   Culture  Setup Time 04/17/2012 21:51   Final   Colony Count 8,000 COLONIES/ML   Final   Culture INSIGNIFICANT GROWTH   Final   Report Status 04/18/2012 FINAL   Final  STOOL CULTURE     Status: None   Collection Time    04/17/12  6:00 PM      Result Value Range Status   Specimen Description STOOL   Final   Special Requests NONE   Final    Culture Culture reincubated for better growth   Final   Report Status PENDING   Incomplete  CLOSTRIDIUM DIFFICILE BY PCR     Status: None   Collection Time    04/17/12  6:00 PM      Result Value Range Status   C difficile by pcr NEGATIVE  NEGATIVE Final     Studies: Ct Abdomen Pelvis W Contrast  04/17/2012  *RADIOLOGY REPORT*  Clinical Data: Abdominal pain  CT ABDOMEN AND PELVIS WITH CONTRAST  Technique:  Multidetector CT imaging of the abdomen and pelvis was performed following the standard protocol during bolus administration of intravenous contrast.  Contrast: 50mL OMNIPAQUE IOHEXOL 300 MG/ML  SOLN, OMNIPAQUE IOHEXOL 300 MG/ML  SOLN  Comparison: CT 08/19/2011  Findings: Small left effusion and atelectasis or increased compared to prior.  Right lung bases clear.  Small pericardial effusion is present.  This is similar to prior.  No focal hepatic lesion.  Gallbladder, pancreas, adrenal glands, and spleen are normal.  There is hydronephrosis of the left renal collecting system as well as hydroureter which is severe.  This appears to have worsened slightly in the interval.  This obstruction pattern is secondary to a large calculus within the distal left ureter.  The calculus is within the vesicoureteral junction measuring 7 mm (image 71). There is thickening about the vesicoureteral junction on the left. Right kidney is normal.  Delayed pyelogram phase imaging demonstrates no excretion into the left renal collecting system.  The stomach, small bowel, and colon unremarkable.  Abdominal aorta normal caliber.  No retroperitoneal periportal lymphadenopathy.  The no aggressive osseous lesions.  IMPRESSION:  1.  Severe hydronephrosis and hydroureter on the left secondary to an obstructing calculus within the left vesicoureteral junction. Recommend urology consultation.  2.  Hypertrophy tissue within the bladder wall adjacent to the obstructing calculus at the left vesicoureteral junction. 3.  Increasing  left effusion and atelectasis. 4.  Stable pericardial effusion.   Original Report Authenticated By: Genevive Bi, M.D.     Scheduled Meds: . amiodarone  200 mg Oral BID  . amLODipine  5 mg Oral Daily  . cefTRIAXone (ROCEPHIN)  IV  1 g Intravenous Q24H  . ciprofloxacin  400 mg Intravenous 60 min Pre-Op  . digoxin  125 mcg Oral q morning - 10a  . losartan  50 mg Oral q morning - 10a  . nebivolol  5 mg Oral q morning - 10a  . niacin  250 mg Oral Daily  . omega-3 acid ethyl esters  1 g Oral Daily  . sodium chloride  3 mL Intravenous Q12H   Continuous Infusions: . 0.9 % NaCl with KCl 20 mEq /  L 100 mL/hr at 04/19/12 0630

## 2012-04-19 NOTE — Op Note (Signed)
Pre-operative diagnosis :    Left lower ureteral calculus with severe left hydroureteronephrosis  Postoperative diagnosis:   Same  Operation:   Cystourethroscopy, left retropyelogram interpretation, left double-J stent (6 [place] by 24 cm)  Surgeon:  Kathie Rhodes. Patsi Sears, MD  First assistant:   None  Anesthesia:  general  Preparation:  After appropriate preanesthesia, the patient was brought to the operating room, placed on the operating table in the dorsal supine position where general LMA anesthesia was introduced. The left arm was previously marked. The CT scan was placed on the x-ray view box. The patient received IV Tylenol. The patient was then placed in the dorsal lithotomy position, where the pubis was prepped with antibiotic soap, and draped in usual fashion.  Review history:  Severe L hydronephrosis, with 7mm stone at u-v junction x 1 1/2 years.   Statement of  Likelihood of Success: Excellent. TIME-OUT observed.:  Procedure:  Cystourethroscopy revealed a normal-appearing right-sided trigone, but a very edematous, erythematous left hemitrigone. The ureteral orifices were in normal position on the trigone. The left ureteral orifice was identified, and retrograde PolyGram was performed. No stone was identified within the ureteral orifice, or in the lower ureter and lower ureteral edema was noted, and the CT scan was reconsulted to be sure that there was stone in the left lower ureter. The entire ureter was scoped, and the renal pelvis was also scope. Pyeloscopy was accomplished, and no stone was identified. Is felt that the stone had grown into the wall of the ureter, and that the mucosa had overgrown the stone. Therefore, I placed a 6 [place] by 24 cm double-J stent under fluoroscopic control, and following this, the Endo suppository was placed. The patient received IV Toradol, was awakened, taken to recovery room in good condition.

## 2012-04-19 NOTE — Evaluation (Signed)
Physical Therapy Evaluation Patient Details Name: Traci [name] MRN: [id number] DOB: 1934/01/31 Today's Date: 04/19/2012 Time: 7829-5621 PT Time Calculation (min): 24 min  PT Assessment / Plan / Recommendation Clinical Impression  Pt presents with hydronephrosis and to hopefully go for cystoscopy with ureteral stent placement today.  As of now pt is supervision for ambulation with intermittent min assist for higher level balance activity.  Pt will benefit from skilled PT in acute venue to address deficits.  PT recommends OP PT, however pt lives in [place] and did not want to drive to Neuro OP clinic.  Also discussed HHPT with pt and she said she would think about it.  Recommend HHPT for balance training.     PT Assessment  Patient needs continued PT services    Follow Up Recommendations  Home health PT    Does the patient have the potential to tolerate intense rehabilitation      Barriers to Discharge None      Equipment Recommendations  None recommended by PT    Recommendations for Other Services     Frequency Min 3X/week    Precautions / Restrictions Precautions Precautions: Fall   Pertinent Vitals/Pain No pain      Mobility  Bed Mobility Bed Mobility: Supine to Sit;Sit to Supine Supine to Sit: 6: Modified independent (Device/Increase time) Sit to Supine: 6: Modified independent (Device/Increase time) Transfers Transfers: Stand to Sit;Sit to Stand Sit to Stand: 5: Supervision;From bed Stand to Sit: 5: Supervision;To bed Details for Transfer Assistance: Supervision for safety.  Ambulation/Gait Ambulation/Gait Assistance: 5: Supervision;4: Min guard;4: Min assist Ambulation Distance (Feet): 1000 Feet Assistive device: None Ambulation/Gait Assistance Details: Pt mostly supervision for ambulation, however with head turns requires min/guard to min assist for intermittent LOB and unsteadiness.   Gait Pattern: Step-through pattern;Decreased stride length Stairs:  No Wheelchair Mobility Wheelchair Mobility: No    Exercises Other Exercises Other Exercises: Had pt perform tandem stance and single leg stance B LE x 30 secs (educated on how to perform at home in corner with chair in front of her for safety).  Other Exercises: also had pt perform standing feet apart and together on pillow for 30 secs each (again in corner with chair in front)   PT Diagnosis: Abnormality of gait  PT Problem List: Decreased balance;Decreased mobility;Decreased coordination PT Treatment Interventions: Gait training;Functional mobility training;Therapeutic activities;Therapeutic exercise;Balance training;Neuromuscular re-education;Patient/family education   PT Goals Acute Rehab PT Goals PT Goal Formulation: With patient Time For Goal Achievement: 04/26/12 Potential to Achieve Goals: Good Pt will go Sit to Stand: Independently PT Goal: Sit to Stand - Progress: Goal set today Pt will Ambulate: >150 feet;Independently PT Goal: Ambulate - Progress: Goal set today Pt will Perform Home Exercise Program: Independently PT Goal: Perform Home Exercise Program - Progress: Goal set today  Visit Information  Last PT Received On: 04/19/12 Assistance Needed: +1    Subjective Data  Subjective: I just want to have this surgery and get back home.  Patient Stated Goal: to return home.    Prior Functioning  Home Living Lives With: Spouse Available Help at Discharge: Family;Available 24 hours/day Type of Home: House Home Access: Ramped entrance Home Layout: One level (has single step to restroom and to bedroom) Bathroom Shower/Tub: Engineer, manufacturing systems: Standard Home Adaptive Equipment: Shower chair without back Prior Function Level of Independence: Independent Able to Take Stairs?: Yes Driving: Yes Vocation: Retired Musician: No difficulties    Copywriter, advertising Overall Cognitive Status: Appears within  functional limits for tasks  assessed/performed Arousal/Alertness: Awake/alert Orientation Level: Appears intact for tasks assessed Behavior During Session: [facility] for tasks performed    Extremity/Trunk Assessment Right Lower Extremity Assessment RLE ROM/Strength/Tone: [facility] for tasks assessed RLE Sensation: WFL - Light Touch Left Lower Extremity Assessment LLE ROM/Strength/Tone: WFL for tasks assessed LLE Sensation: WFL - Light Touch Trunk Assessment Trunk Assessment: Normal   Balance High Level Balance High Level Balance Activites: Head turns High Level Balance Comments: Again, pt noted to lose balance with head turns up/down and side to side with amb requring min assist to steady.   End of Session PT - End of Session Activity Tolerance: Patient tolerated treatment well Patient left: in bed;with call bell/phone within reach (no chair available) Nurse Communication: Mobility status  GP     Vista Deck 04/19/2012, 1:32 PM

## 2012-04-20 ENCOUNTER — Encounter (HOSPITAL_COMMUNITY): Payer: Self-pay | Admitting: Urology

## 2012-04-20 LAB — BASIC METABOLIC PANEL
BUN: 15 mg/dL (ref 6–23)
Calcium: 8.1 mg/dL — ABNORMAL LOW (ref 8.4–10.5)
Creatinine, Ser: 0.77 mg/dL (ref 0.50–1.10)
GFR calc non Af Amer: 80 mL/min — ABNORMAL LOW (ref >90)
Glucose, Bld: 168 mg/dL — ABNORMAL HIGH (ref 70–99)
Sodium: 138 mEq/L (ref 135–145)

## 2012-04-20 LAB — CBC
Hemoglobin: 12.8 g/dL (ref 12.0–15.0)
MCH: 33.1 pg (ref 26.0–34.0)
MCHC: 35.2 g/dL (ref 30.0–36.0)

## 2012-04-20 LAB — PROTIME-INR: INR: 1.42 (ref 0.00–1.49)

## 2012-04-20 MED ORDER — WARFARIN SODIUM 4 MG PO TABS
4.0000 mg | ORAL_TABLET | Freq: Once | ORAL | Status: DC
  Filled 2012-04-20: qty 1

## 2012-04-20 MED ORDER — WARFARIN - PHARMACIST DOSING INPATIENT: Freq: Every day | Status: DC

## 2012-04-20 NOTE — Progress Notes (Signed)
[facility] PROGRESS NOTE  Traci [name] JYN:[id number] DOB: 31-May-1934 DOA: 04/17/2012 PCP: Kirk Ruths, MD  Brief narrative: 78 year old female with past medical history of atrial fibrillation on coumadin, status post pacemaker placement, dementia, recently started on antibiotic for UTI. She presented to ED 04/17/12 with complaints of bladder fullness, burning sensation on urination. In addition, patient reported diarrhea which subsequently resolved and was determined to be secondary to antibiotics. In ED, evaluation included CT abdomen which revealed 7 mm calculus in left vesicoureteral junction with left hydronephrosis. Urology consulted and patient is status post cystourethroscopy and left double-J stenting.  Assessment/Plan:   Principal Problem:  *Hydronephrosis, left  Obstructing renal stone, 7 mm  Appreciate urology consultation. Patient is status postcystourethroscopy and left double-J stenting, no subsequent complications. Continue Rocephin for possible UTI. Active Problems:  UTI (urinary tract infection)  Urine culture shows no growth. Continue Rocephin HYPERLIPIDEMIA  Continue niacin and Lovaza Atrial fibrillation  We will restart Coumadin today. Coumadin was on hold due to procedures yesterday. Continue amiodarone. Patient is stable in regards to atrial fibrillation. She does not need cardiac monitoring at this time. Diarrhea  Antibiotic induced. Resolved Leukocytosis, unspecified  Secondary to UTI  White blood cell count normalized Urine culture shows no growth.  Code Status: full code  Family Communication: no family at bedside  Disposition Plan: home when stable   Manson Passey, MD  [facility]  Pager [phone]   Consultants:  Urology  Procedures:  None  Antibiotics:  None   If 7PM-7AM, please contact night-coverage [email or web address] Password [email or web address] 04/20/2012, 11:05 AM   LOS: 3 days    HPI/Subjective: No acute events overnight.  Objective: Filed  Vitals:   04/20/12 0213 04/20/12 0436 04/20/12 0500 04/20/12 0600  BP: 115/59 117/59    Pulse: 62 67    Temp: 97.4 F (36.3 C) 98.3 F (36.8 C)    TempSrc: Oral Oral    Resp: 18 16    Height:    5\' 5"  (1.651 m)  Weight:   67.6 kg (149 lb 0.5 oz)   SpO2: 97% 97%      Intake/Output Summary (Last 24 hours) at 04/20/12 1105 Last data filed at 04/20/12 0438  Gross per 24 hour  Intake   1300 ml  Output    200 ml  Net   1100 ml    Exam:   General:  Pt is alert, follows commands appropriately, not in acute distress  Cardiovascular: Regular rate and rhythm, S1/S2, no murmurs, no rubs, no gallops  Respiratory: Clear to auscultation bilaterally, no wheezing, no crackles, no rhonchi  Abdomen: Soft, non tender, non distended, bowel sounds present, no guarding  Extremities: No edema, pulses DP and PT palpable bilaterally  Neuro: Grossly nonfocal  Data Reviewed: Basic Metabolic Panel:  Recent Labs Lab 04/17/12 1448 04/19/12 0754 04/19/12 2044 04/20/12 0454  NA 139 140  --  138  K 3.5 3.7  --  3.9  CL 101 107  --  107  CO2 26 24  --  22  GLUCOSE 103* 104*  --  168*  BUN 16 11  --  15  CREATININE 0.95 0.74 0.68 0.77  CALCIUM 10.7* 8.6  --  8.1*  MG 1.9  --   --   --    Liver Function Tests:  Recent Labs Lab 04/17/12 1448 04/19/12 0754  AST 28 16  ALT 33 20  ALKPHOS 58 49  BILITOT 0.3 0.4  PROT 6.8 6.6  ALBUMIN 3.4* 3.1*  Recent Labs Lab 04/17/12 1448  LIPASE 61*   No results found for this basename: AMMONIA,  in the last 168 hours CBC:  Recent Labs Lab 04/17/12 1448 04/19/12 0754 04/19/12 2044 04/20/12 0454  WBC 15.5* 10.1 11.6* 12.1*  NEUTROABS 10.2*  --   --   --   HGB 14.0 12.9 13.1 12.8  HCT 40.8 37.9 39.6 36.4  MCV 94.7 94.8 95.9 94.1  PLT 256 245 256 256    URINE CULTURE     Status: None   Collection Time    04/17/12  2:29 PM      Result Value Range Status   Specimen Description URINE, CLEAN CATCH   Final   Colony Count 8,000  COLONIES/ML   Final   Culture INSIGNIFICANT GROWTH   Final   Report Status 04/18/2012 FINAL   Final  STOOL CULTURE     Status: None   Collection Time    04/17/12  6:00 PM      Result Value Range Status   Specimen Description STOOL   Final   Report Status PENDING   Incomplete  CLOSTRIDIUM DIFFICILE BY PCR     Status: None   Collection Time    04/17/12  6:00 PM      Result Value Range Status   C difficile by pcr NEGATIVE  NEGATIVE Final  SURGICAL PCR SCREEN     Status: None   Collection Time    04/19/12  3:29 PM      Result Value Range Status   MRSA, PCR NEGATIVE  NEGATIVE Final     Studies: No results found.  Scheduled Meds: . amiodarone  200 mg Oral BID  . amLODipine  5 mg Oral Daily  . cefTRIAXone   1 g Intravenous Q24H  . digoxin  125 mcg Oral q morning - 10a  . enoxaparin (LOVENOX)  40 mg Subcutaneous Q24H  . ketorolac  15 mg Intravenous Q6H  . losartan  50 mg Oral q morning - 10a  . nebivolol  5 mg Oral q morning - 10a  . niacin  250 mg Oral Daily  . Omega 3  1 g Oral Daily  . warfarin  4 mg Oral ONCE-1800

## 2012-04-20 NOTE — Progress Notes (Signed)
Physical Therapy Treatment Patient Details Name: Traci [name] MRN: [id number] DOB: May 08, 1934 Today's Date: 04/20/2012 Time: 9147-8295 PT Time Calculation (min): 28 min  PT Assessment / Plan / Recommendation Comments on Treatment Session  Pt performed high level balance activities today and ambulated in hallway.  Pt educated on safety with exercises when performing at home, encouraged 2-3 times a day with each activity 3-4 times depending on fatigue.    Follow Up Recommendations  Home health PT     Does the patient have the potential to tolerate intense rehabilitation     Barriers to Discharge        Equipment Recommendations  None recommended by PT    Recommendations for Other Services    Frequency     Plan Discharge plan remains appropriate;Frequency remains appropriate    Precautions / Restrictions Precautions Precautions: Fall   Pertinent Vitals/Pain n/a    Mobility  Bed Mobility Bed Mobility: Sit to Supine Sit to Supine: 6: Modified independent (Device/Increase time) Transfers Transfers: Stand to Sit;Sit to Stand Sit to Stand: 5: Supervision;From bed Stand to Sit: 5: Supervision;To bed Details for Transfer Assistance: Supervision for safety.  Ambulation/Gait Ambulation/Gait Assistance: 4: Min assist Ambulation Distance (Feet): 500 Feet Assistive device: None Ambulation/Gait Assistance Details: mostly supervision however occasional min assist for LOB with any balance challenge i.e. head turns Gait Pattern: Step-through pattern;Decreased stride length Stairs: No Wheelchair Mobility Wheelchair Mobility: No    Exercises     PT Diagnosis:    PT Problem List:   PT Treatment Interventions:     PT Goals Acute Rehab PT Goals PT Goal: Sit to Stand - Progress: Progressing toward goal PT Goal: Ambulate - Progress: Progressing toward goal PT Goal: Perform Home Exercise Program - Progress: Progressing toward goal  Visit Information  Last PT Received On:  04/20/12 Assistance Needed: +1    Subjective Data  Subjective: My chiropractor asks about this too. (balance exercises)   Cognition  Cognition Overall Cognitive Status: Appears within functional limits for tasks assessed/performed Arousal/Alertness: Awake/alert Orientation Level: Appears intact for tasks assessed Behavior During Session: [facility] for tasks performed    Balance  Balance Balance Assessed: Yes Static Standing Balance Static Standing - Comment/# of Minutes: pt stood in corner with chair in front with eyes closed for 30 sec (swaying present) x2, SLS each LE x3, tandem each LE x1 Single Leg Stance - Right Leg: 30 Single Leg Stance - Left Leg: 6 Tandem Stance - Right Leg: 2 Tandem Stance - Left Leg: 2 High Level Balance High Level Balance Activites: Head turns;Braiding High Level Balance Comments: head turns with min assist to steady with occasional LOB for 100 feet, grapevine for 15 feet each side x3 facing handrail, tight rope walking for 100 feet with min assist for 50 feet due to no UE support however minguard with pt holding IV pole  End of Session PT - End of Session Equipment Utilized During Treatment: Gait belt Activity Tolerance: Patient tolerated treatment well Patient left: with call bell/phone within reach;with nursing in room   GP     Zenita Kister,KATHrine E 04/20/2012, 11:12 AM Zenovia Jarred, PT, DPT 04/20/2012 Pager: [phone]

## 2012-04-20 NOTE — Discharge Summary (Signed)
Physician Discharge Summary  Traci [name]:[id number] DOB: [birth year] DOA: 04/17/2012  PCP: Kirk Ruths, MD  Admit date: 04/17/2012 Discharge date: 04/20/2012  Recommendations for Outpatient Follow-up:   Please follow up these instructions: 1. Please go to your PCP and recheck INR tomorrow, 04/21/2012. Your INR today is 1.42.  Tonight, take 5 mg coumadin and when you recheck INR tomorrow your PCP will tell you how much coumadin to take. 2. Please follow up with urology in regards to stent placement, recommendation was to keep stent in for 3 months.  Discharge Diagnoses:  Principal Problem:   Hydronephrosis, left Active Problems:   UTI (urinary tract infection)   HYPERLIPIDEMIA   Atrial fibrillation   Diarrhea   Ureterovesical junction obstruction, left   Leukocytosis, unspecified   Discharge Condition: medically stable for discharge home today; HH PT and RN  Diet recommendation: as tolerated  History of present illness:  78 year old female with past medical history of atrial fibrillation on coumadin, status post pacemaker placement, dementia, recently started on antibiotic for UTI. She presented to ED 04/17/12 with complaints of bladder fullness, burning sensation on urination. In addition, patient reported diarrhea which subsequently resolved and was determined to be secondary to antibiotics.  In ED, evaluation included CT abdomen which revealed 7 mm calculus in left vesicoureteral junction with left hydronephrosis. Urology consulted and patient is status post cystourethroscopy and left double-J stenting.   Assessment/Plan:   Principal Problem:  *Hydronephrosis, left  Obstructing renal stone, 7 mm  Appreciate urology consultation. Patient is status postcystourethroscopy and left double-J stenting, no subsequent complications.   Active Problems:  UTI (urinary tract infection)  Urine culture shows no growth.  D/C antibiotics. HYPERLIPIDEMIA  Continue niacin and  Lovaza Atrial fibrillation  We will restart Coumadin today. Follow up INR per instructions above. Continue amiodarone.  Patient is stable in regards to atrial fibrillation. She does not need cardiac monitoring at this time. Diarrhea  Antibiotic induced.  Resolved Leukocytosis, unspecified  Secondary to UTI  White blood cell count normalized  Urine culture shows no growth. No need for antibiotics at the time of discharge.   Code Status: full code  Family Communication: no family at bedside   Manson Passey, MD  [facility]  Pager [phone]   Consultants:  Urology  Procedures:  None  Antibiotics:  None    Discharge Exam: Filed Vitals:   04/20/12 1330  BP: 121/68  Pulse: 65  Temp: 98.7 F (37.1 C)  Resp: 17   Filed Vitals:   04/20/12 0436 04/20/12 0500 04/20/12 0600 04/20/12 1330  BP: 117/59   121/68  Pulse: 67   65  Temp: 98.3 F (36.8 C)   98.7 F (37.1 C)  TempSrc: Oral   Oral  Resp: 16   17  Height:   5\' 5"  (1.651 m)   Weight:  67.6 kg (149 lb 0.5 oz)    SpO2: 97%   98%    General: Pt is alert, follows commands appropriately, not in acute distress Cardiovascular: Regular rate and rhythm, S1/S2 +, no murmurs, no rubs, no gallops Respiratory: Clear to auscultation bilaterally, no wheezing, no crackles, no rhonchi Abdominal: Soft, non tender, non distended, bowel sounds +, no guarding Extremities: no edema, no cyanosis, pulses palpable bilaterally DP and PT Neuro: Grossly nonfocal  Discharge Instructions  Discharge Orders   Future Orders Complete By Expires     Call MD for:  difficulty breathing, headache or visual disturbances  As directed  Call MD for:  persistant dizziness or light-headedness  As directed     Call MD for:  persistant nausea and vomiting  As directed     Call MD for:  severe uncontrolled pain  As directed     Diet - low sodium heart healthy  As directed     Discharge instructions  As directed     Comments:      Please follow up these  instructions: 1. Please go to your PCP and recheck INR tomorrow, 04/21/2012. Your INR today is 1.42.  Tonight, take 5 mg coumadin and when you recheck INR tomorrow your PCP will tell you how much coumadin to take. 2. Please follow up with urology in regards to stent placement, recommendation was to keep stent in for 3 months.    Increase activity slowly  As directed         Medication List    TAKE these medications       amiodarone 200 MG tablet  Commonly known as:  PACERONE  Take 200 mg by mouth 2 (two) times daily.     amLODipine 5 MG tablet  Commonly known as:  NORVASC  Take 5 mg by mouth daily.     CALCIUM 1200 PO  Take 1 capsule by mouth 2 (two) times daily.     cholecalciferol 400 UNITS Tabs  Commonly known as:  VITAMIN D  Take 400 Units by mouth every morning.     digoxin 0.125 MG tablet  Commonly known as:  LANOXIN  Take 125 mcg by mouth every morning.     fish oil-omega-3 fatty acids 1000 MG capsule  Take 1 g by mouth every morning.     LECITHIN PO  Take 1,200 mg by mouth daily.     losartan 50 MG tablet  Commonly known as:  COZAAR  Take 50 mg by mouth every morning.     nebivolol 5 MG tablet  Commonly known as:  BYSTOLIC  Take 5 mg by mouth every morning.     niacin 250 MG tablet  Take 250 mg by mouth daily.     vitamin C 1000 MG tablet  Take 1,000 mg by mouth 2 (two) times daily.     warfarin 5 MG tablet  Commonly known as:  COUMADIN  Take 2.5 mg by mouth every morning.           Follow-up Information   Follow up with Jethro Bolus I, MD. (call office for follow up appt date and time)    Contact information:   [address] [place] [address] [phone]        The results of significant diagnostics from this hospitalization (including imaging, microbiology, ancillary and laboratory) are listed below for reference.    Significant Diagnostic Studies: Ct Abdomen Pelvis W  Contrast  04/17/2012  *RADIOLOGY REPORT*  Clinical Data: Abdominal pain  CT ABDOMEN AND PELVIS WITH CONTRAST  Technique:  Multidetector CT imaging of the abdomen and pelvis was performed following the standard protocol during bolus administration of intravenous contrast.  Contrast: 50mL OMNIPAQUE IOHEXOL 300 MG/ML  SOLN, OMNIPAQUE IOHEXOL 300 MG/ML  SOLN  Comparison: CT 08/19/2011  Findings: Small left effusion and atelectasis or increased compared to prior.  Right lung bases clear.  Small pericardial effusion is present.  This is similar  to prior.  No focal hepatic lesion.  Gallbladder, pancreas, adrenal glands, and spleen are normal.  There is hydronephrosis of the left renal collecting system as well as hydroureter which is severe.  This appears to have worsened slightly in the interval.  This obstruction pattern is secondary to a large calculus within the distal left ureter.  The calculus is within the vesicoureteral junction measuring 7 mm (image 71). There is thickening about the vesicoureteral junction on the left. Right kidney is normal.  Delayed pyelogram phase imaging demonstrates no excretion into the left renal collecting system.  The stomach, small bowel, and colon unremarkable.  Abdominal aorta normal caliber.  No retroperitoneal periportal lymphadenopathy.  The no aggressive osseous lesions.  IMPRESSION:  1.  Severe hydronephrosis and hydroureter on the left secondary to an obstructing calculus within the left vesicoureteral junction. Recommend urology consultation.  2.  Hypertrophy tissue within the bladder wall adjacent to the obstructing calculus at the left vesicoureteral junction. 3.  Increasing left effusion and atelectasis. 4.  Stable pericardial effusion.   Original Report Authenticated By: Genevive Bi, M.D.     Microbiology: Recent Results (from the past 240 hour(s))  URINE CULTURE     Status: None   Collection Time    04/17/12  2:29 PM      Result Value Range Status    Specimen Description URINE, CLEAN CATCH   Final   Special Requests NONE   Final   Culture  Setup Time 04/17/2012 21:51   Final   Colony Count 8,000 COLONIES/ML   Final   Culture INSIGNIFICANT GROWTH   Final   Report Status 04/18/2012 FINAL   Final  STOOL CULTURE     Status: None   Collection Time    04/17/12  6:00 PM      Result Value Range Status   Specimen Description STOOL   Final   Special Requests NONE   Final   Culture     Final   Value: NO SUSPICIOUS COLONIES, CONTINUING TO HOLD     Note: REDUCED NORMAL FLORA PRESENT   Report Status PENDING   Incomplete  CLOSTRIDIUM DIFFICILE BY PCR     Status: None   Collection Time    04/17/12  6:00 PM      Result Value Range Status   C difficile by pcr NEGATIVE  NEGATIVE Final  SURGICAL PCR SCREEN     Status: None   Collection Time    04/19/12  3:29 PM      Result Value Range Status   MRSA, PCR NEGATIVE  NEGATIVE Final   Staphylococcus aureus NEGATIVE  NEGATIVE Final   Comment:            The Xpert SA Assay (FDA     approved for NASAL specimens     in patients over 85 years of age),     is one component of     a comprehensive surveillance     program.  Test performance has     been validated by [employer] for patients greater     than or equal to 96 year old.     It is not intended     to diagnose infection nor to     guide or monitor treatment.     Labs: Basic Metabolic Panel:  Recent Labs Lab 04/17/12 1448 04/19/12 0754 04/19/12 2044 04/20/12 0454  NA 139 140  --  138  K 3.5 3.7  --  3.9  CL 101 107  --  107  CO2 26 24  --  22  GLUCOSE 103* 104*  --  168*  BUN 16 11  --  15  CREATININE 0.95 0.74 0.68 0.77  CALCIUM 10.7* 8.6  --  8.1*  MG 1.9  --   --   --    Liver Function Tests:  Recent Labs Lab 04/17/12 1448 04/19/12 0754  AST 28 16  ALT 33 20  ALKPHOS 58 49  BILITOT 0.3 0.4  PROT 6.8 6.6  ALBUMIN 3.4* 3.1*    Recent Labs Lab 04/17/12 1448  LIPASE 61*   No results found for this  basename: AMMONIA,  in the last 168 hours CBC:  Recent Labs Lab 04/17/12 1448 04/19/12 0754 04/19/12 2044 04/20/12 0454  WBC 15.5* 10.1 11.6* 12.1*  NEUTROABS 10.2*  --   --   --   HGB 14.0 12.9 13.1 12.8  HCT 40.8 37.9 39.6 36.4  MCV 94.7 94.8 95.9 94.1  PLT 256 245 256 256   Cardiac Enzymes: No results found for this basename: CKTOTAL, CKMB, CKMBINDEX, TROPONINI,  in the last 168 hours BNP: BNP (last 3 results) No results found for this basename: PROBNP,  in the last 8760 hours CBG: No results found for this basename: GLUCAP,  in the last 168 hours  Time coordinating discharge: Over 30 minutes  Signed:  Manson Passey, MD  [facility]  04/20/2012, 4:36 PM  Pager #: [phone]

## 2012-04-20 NOTE — Progress Notes (Signed)
ANTICOAGULATION CONSULT NOTE - Follow Up Consult  Pharmacy Consult for Coumadin Indication: h/o atrial fibrillation  Allergies  Allergen Reactions  . Morphine Nausea Only  . Penicillins     Patient Measurements: Height: 5\' 5"  (165.1 cm) Weight: 149 lb 0.5 oz (67.6 kg) IBW/kg (Calculated) : 57  Vital Signs: Temp: 98.3 F (36.8 C) (03/25 0436) Temp src: Oral (03/25 0436) BP: 117/59 mmHg (03/25 0436) Pulse Rate: 67 (03/25 0436)  Labs:  Recent Labs  04/17/12 1448 04/19/12 0754 04/19/12 2044 04/20/12 0454  HGB 14.0 12.9 13.1 12.8  HCT 40.8 37.9 39.6 36.4  PLT 256 245 256 256  LABPROT 20.9* 17.9*  --  17.0*  INR 1.88* 1.52*  --  1.42  CREATININE 0.95 0.74 0.68 0.77    Estimated Creatinine Clearance: 54.7 ml/min (by C-G formula based on Cr of 0.77).   Assessment: 65 yoF with hx tachybrady syndrome s/p pacemaker placement and hx atrial fibrillation on chronic coumadin presented with painful urination, failed 2 courses abx outpatient. Pt found to have left hydronephrosis with obstruction, tx'd to [facility] for urology consult. Patient underwent removal R ureteral stone and L double-J stent placement 3/24.  MD ordered to resume Coumadin on 3/25.   Home Coumadin dose per patient = 2.5mg  po daily with last dose of Coumadin 3/22.  INR today 1.42.  MD also ordered for Lovenox 40 mg sq q24h (dose not charted as given last night).   Home amiodarone resumed inpatient.  Potential drug-drug interaction with Ciprofloxacin noted. CBC wnl, no bleeding.   Goal of Therapy:  INR 2-3 Monitor platelets by anticoagulation protocol: Yes   Plan:   Coumadin 4 mg po x 1 tonight  Daily PT/INR  Continue Lovenox 40 mg sq q24h and discontinue when INR therapeutic  Pharmacy will f/u  Geoffry Paradise, PharmD, BCPS Pager: [phone] 8:52 AM Pharmacy #: [id number]

## 2012-04-20 NOTE — Progress Notes (Signed)
1 Day Post-Op Subjective: Patient reports tolerating PO and pain control good.  She is pleased that her urinary frequency has decreased since admission.  Objective: Vital signs in last 24 hours: Temp:  [97.4 F (36.3 C)-98.3 F (36.8 C)] 98.3 F (36.8 C) (03/25 0436) Pulse Rate:  [56-86] 67 (03/25 0436) Resp:  [13-19] 16 (03/25 0436) BP: (115-165)/(59-84) 117/59 mmHg (03/25 0436) SpO2:  [92 %-97 %] 97 % (03/25 0436) Weight:  [67.6 kg (149 lb 0.5 oz)] 67.6 kg (149 lb 0.5 oz) (03/25 0500)  Intake/Output from previous day: 03/24 0701 - 03/25 0700 In: 1300 [I.V.:1300] Out: 1200 [Urine:1200] Intake/Output this shift:    Physical Exam:  General:alert, cooperative and no distress GI: soft, non tender, no palpable masses    Lab Results:  Recent Labs  04/19/12 0754 04/19/12 2044 04/20/12 0454  HGB 12.9 13.1 12.8  HCT 37.9 39.6 36.4   BMET  Recent Labs  04/19/12 0754 04/19/12 2044 04/20/12 0454  NA 140  --  138  K 3.7  --  3.9  CL 107  --  107  CO2 24  --  22  GLUCOSE 104*  --  168*  BUN 11  --  15  CREATININE 0.74 0.68 0.77  CALCIUM 8.6  --  8.1*    Recent Labs  04/17/12 1448 04/19/12 0754 04/20/12 0454  INR 1.88* 1.52* 1.42   No results found for this basename: LABURIN,  in the last 72 hours Results for orders placed during the hospital encounter of 04/17/12  URINE CULTURE     Status: None   Collection Time    04/17/12  2:29 PM      Result Value Range Status   Specimen Description URINE, CLEAN CATCH   Final   Special Requests NONE   Final   Culture  Setup Time 04/17/2012 21:51   Final   Colony Count 8,000 COLONIES/ML   Final   Culture INSIGNIFICANT GROWTH   Final   Report Status 04/18/2012 FINAL   Final  STOOL CULTURE     Status: None   Collection Time    04/17/12  6:00 PM      Result Value Range Status   Specimen Description STOOL   Final   Special Requests NONE   Final   Culture Culture reincubated for better growth   Final   Report Status  PENDING   Incomplete  CLOSTRIDIUM DIFFICILE BY PCR     Status: None   Collection Time    04/17/12  6:00 PM      Result Value Range Status   C difficile by pcr NEGATIVE  NEGATIVE Final  SURGICAL PCR SCREEN     Status: None   Collection Time    04/19/12  3:29 PM      Result Value Range Status   MRSA, PCR NEGATIVE  NEGATIVE Final   Staphylococcus aureus NEGATIVE  NEGATIVE Final   Comment:            The Xpert SA Assay (FDA     approved for NASAL specimens     in patients over 57 years of age),     is one component of     a comprehensive surveillance     program.  Test performance has     been validated by [employer] for patients greater     than or equal to 28 year old.     It is not intended     to  diagnose infection nor to     guide or monitor treatment.    Studies/Results: No results found.  Assessment/Plan: 1 Day Post-Op Procedure(s) (LRB): CYSTOSCOPY WITH RETROGRADE PYELOGRAM/URETERAL STENT PLACEMENT  (Left)  Leave stent in place for approx 3 months.  Pt will eventually need a lasix renogram to eval renal function and aid with long term plan.      LOS: 3 days   Traci [name]. 04/20/2012, 1:38 PM

## 2012-04-21 ENCOUNTER — Encounter: Payer: Self-pay | Admitting: *Deleted

## 2012-04-21 NOTE — Progress Notes (Signed)
Noted orders for HHC, CM was not notified of HHC needs at discharge; patient called at home to arrange [facility] services, patient stated " Honey, I don' need that, I'm going to see my doctor today". Abelino Derrick RN,BSN,MHA

## 2012-04-22 LAB — STOOL CULTURE

## 2012-04-23 NOTE — Progress Notes (Signed)
04/19/12 1331  PT G-Codes **NOT FOR INPATIENT CLASS**  Functional Assessment Tool Used Clinical judgement and high level balance activities.   Functional Limitation Mobility: Walking and moving around  Mobility: Walking and Moving Around Current Status [phone]) CI  Mobility: Walking and Moving Around Goal Status (U0454) CH  PT General Charges  $$ ACUTE PT VISIT 1 Procedure  PT Evaluation  $Initial PT Evaluation Tier I 1 Procedure  PT Treatments  $Gait Training 8-22 mins

## 2012-04-26 ENCOUNTER — Other Ambulatory Visit (HOSPITAL_COMMUNITY): Payer: Self-pay | Admitting: Urology

## 2012-04-26 DIAGNOSIS — N1330 Unspecified hydronephrosis: Secondary | ICD-10-CM

## 2012-04-28 LAB — REMOTE PACEMAKER DEVICE
AL AMPLITUDE: 1 mv
ATRIAL PACING PM: 12
BAMS-0001: 175 {beats}/min
BRDY-0004RV: 130 {beats}/min
RV LEAD IMPEDENCE PM: 513 Ohm
RV LEAD THRESHOLD: 0.875 V

## 2012-05-11 ENCOUNTER — Encounter: Payer: Self-pay | Admitting: *Deleted

## 2012-05-24 ENCOUNTER — Encounter (HOSPITAL_COMMUNITY)
Admission: RE | Admit: 2012-05-24 | Discharge: 2012-05-24 | Disposition: A | Discharge status: Home / Self Care | Payer: Medicare Other | Source: Ambulatory Visit | Attending: Urology | Admitting: Urology

## 2012-05-24 ENCOUNTER — Encounter (HOSPITAL_COMMUNITY): Payer: Self-pay

## 2012-05-24 DIAGNOSIS — N1330 Unspecified hydronephrosis: Secondary | ICD-10-CM | POA: Insufficient documentation

## 2012-05-24 DIAGNOSIS — N201 Calculus of ureter: Secondary | ICD-10-CM | POA: Insufficient documentation

## 2012-05-24 MED ORDER — TECHNETIUM TC 99M MERTIATIDE
15.3000 | Freq: Once | INTRAVENOUS | Status: AC | PRN
  Administered 2012-05-24: 15.3 via INTRAVENOUS

## 2012-05-24 MED ORDER — FUROSEMIDE 10 MG/ML IJ SOLN
40.0000 mg | Freq: Once | INTRAMUSCULAR | Status: AC
  Administered 2012-05-24: 33 mg via INTRAVENOUS
  Filled 2012-05-24: qty 4

## 2012-06-02 ENCOUNTER — Ambulatory Visit (HOSPITAL_COMMUNITY): Payer: Medicare Other

## 2012-06-14 ENCOUNTER — Telehealth: Payer: Self-pay | Admitting: Internal Medicine

## 2012-06-14 ENCOUNTER — Other Ambulatory Visit: Payer: Self-pay | Admitting: Urology

## 2012-06-14 NOTE — Telephone Encounter (Signed)
Faxed surgical clearance to [facility] on [phone]  for Dr. Ladona Ridgel  [phone] fax

## 2012-06-18 ENCOUNTER — Encounter (HOSPITAL_COMMUNITY): Payer: Self-pay | Admitting: Pharmacy Technician

## 2012-06-28 ENCOUNTER — Encounter (HOSPITAL_COMMUNITY): Payer: Self-pay

## 2012-06-28 ENCOUNTER — Ambulatory Visit (HOSPITAL_COMMUNITY)
Admission: RE | Admit: 2012-06-28 | Discharge: 2012-06-28 | Disposition: A | Discharge status: Home / Self Care | Payer: Medicare Other | Source: Ambulatory Visit | Attending: Urology | Admitting: Urology

## 2012-06-28 ENCOUNTER — Encounter (HOSPITAL_COMMUNITY)
Admission: RE | Admit: 2012-06-28 | Discharge: 2012-06-28 | Disposition: A | Discharge status: Home / Self Care | Payer: Medicare Other | Source: Ambulatory Visit | Attending: Urology | Admitting: Urology

## 2012-06-28 DIAGNOSIS — Z01810 Encounter for preprocedural cardiovascular examination: Secondary | ICD-10-CM | POA: Insufficient documentation

## 2012-06-28 DIAGNOSIS — Z950 Presence of cardiac pacemaker: Secondary | ICD-10-CM | POA: Insufficient documentation

## 2012-06-28 DIAGNOSIS — Z01812 Encounter for preprocedural laboratory examination: Secondary | ICD-10-CM | POA: Insufficient documentation

## 2012-06-28 DIAGNOSIS — I10 Essential (primary) hypertension: Secondary | ICD-10-CM | POA: Insufficient documentation

## 2012-06-28 DIAGNOSIS — I517 Cardiomegaly: Secondary | ICD-10-CM | POA: Insufficient documentation

## 2012-06-28 DIAGNOSIS — Z01818 Encounter for other preprocedural examination: Secondary | ICD-10-CM | POA: Insufficient documentation

## 2012-06-28 HISTORY — DX: Personal history of urinary calculi: Z87.442

## 2012-06-28 LAB — BASIC METABOLIC PANEL
BUN: 18 mg/dL (ref 6–23)
CO2: 30 mEq/L (ref 19–32)
Calcium: 9.9 mg/dL (ref 8.4–10.5)
Chloride: 102 mEq/L (ref 96–112)
Creatinine, Ser: 0.91 mg/dL (ref 0.50–1.10)
GFR calc Af Amer: 70 mL/min — ABNORMAL LOW (ref >90)
GFR calc non Af Amer: 60 mL/min — ABNORMAL LOW (ref >90)
Glucose, Bld: 127 mg/dL — ABNORMAL HIGH (ref 70–99)
Potassium: 3.9 mEq/L (ref 3.5–5.1)
Sodium: 139 mEq/L (ref 135–145)

## 2012-06-28 LAB — CBC
HCT: 40.6 % (ref 36.0–46.0)
Hemoglobin: 13.5 g/dL (ref 12.0–15.0)
MCH: 32.1 pg (ref 26.0–34.0)
MCHC: 33.3 g/dL (ref 30.0–36.0)
MCV: 96.7 fL (ref 78.0–100.0)
Platelets: 280 10*3/uL (ref 150–400)
RBC: 4.2 MIL/uL (ref 3.87–5.11)
RDW: 13.7 % (ref 11.5–15.5)
WBC: 9.9 10*3/uL (ref 4.0–10.5)

## 2012-06-28 LAB — APTT: aPTT: 30 seconds (ref 24–37)

## 2012-06-28 LAB — SURGICAL PCR SCREEN
MRSA, PCR: NEGATIVE
Staphylococcus aureus: NEGATIVE

## 2012-06-28 LAB — PROTIME-INR: INR: 1.21 (ref 0.00–1.49)

## 2012-06-28 NOTE — Patient Instructions (Addendum)
[address] [facility]  06/28/2012   Your procedure is scheduled on:  06/30/12 [facility]  Report to [facility] at  [phone]     AM.  Call this number if you have problems the morning of surgery: [phone]       Remember:   Do not eat food  Or drink :After Midnight. Tuesday NIGHT   Take these medicines the morning of surgery with A SIP OF WATER: Amlodopine(norvasc), Digoxin(lanoxin),  Nebivolol(Bystolic)   .  Contacts, dentures or partial plates can not be worn to surgery  Leave suitcase in the car. After surgery it may be brought to your room.  For patients admitted to the hospital, checkout time is 11:00 AM day of  discharge.             SPECIAL INSTRUCTIONS- SEE [facility] PREPARING FOR SURGERY INSTRUCTION SHEET-     DO NOT WEAR JEWELRY, LOTIONS, POWDERS, OR PERFUMES.  WOMEN-- DO NOT SHAVE LEGS OR UNDERARMS FOR 12 HOURS BEFORE SHOWERS. MEN MAY SHAVE FACE.  Patients discharged the day of surgery will not be allowed to drive home. IF going home the day of surgery, you must have a driver and someone to stay with you for the first 24 hours  Name and phone number of your driver:    TERESA  DAUGHTER                                                                    Please read over the following fact sheets that you were given: MRSA Information,                                                                                 Richie Bonanno  PST [phone]  [phone]                 FAILURE TO FOLLOW THESE INSTRUCTIONS MAY RESULT IN  CANCELLATION   OF YOUR SURGERY                                                  Patient Signature _____________________________

## 2012-06-28 NOTE — Progress Notes (Signed)
Last pacemaker interrogation 3/14 EP[facility]/ Dr Ladona Ridgel    Note for clearance was faxed to [facility] per Dr Bruna Potter  Office- noted in [facility].   Spoke with Velna Hatchet at [place] at [place]- requesting call back form rep to notify about surgery.  Stated she would page Jana Half

## 2012-06-29 MED ORDER — DEXTROSE 5 % IV SOLN
320.0000 mg | INTRAVENOUS | Status: AC
  Administered 2012-06-30: 320 mg via INTRAVENOUS
  Filled 2012-06-29: qty 8

## 2012-06-29 NOTE — Progress Notes (Signed)
Repaged Jana Half, [employer] Rep-  Informed her of Dr Bruna Potter orders for the pacemaker care from [employer], arrival time and surgery time. States someone would be here

## 2012-06-30 ENCOUNTER — Ambulatory Visit (HOSPITAL_COMMUNITY): Payer: Medicare Other | Admitting: Anesthesiology

## 2012-06-30 ENCOUNTER — Encounter (HOSPITAL_COMMUNITY)
Admission: RE | Disposition: A | Discharge status: Home / Self Care | Payer: Self-pay | Source: Ambulatory Visit | Attending: Urology

## 2012-06-30 ENCOUNTER — Encounter (HOSPITAL_COMMUNITY): Payer: Self-pay | Admitting: Anesthesiology

## 2012-06-30 ENCOUNTER — Encounter (HOSPITAL_COMMUNITY): Payer: Self-pay

## 2012-06-30 ENCOUNTER — Ambulatory Visit (HOSPITAL_COMMUNITY)
Admission: RE | Admit: 2012-06-30 | Discharge: 2012-06-30 | Disposition: A | Discharge status: Home / Self Care | Payer: Medicare Other | Source: Ambulatory Visit | Attending: Urology | Admitting: Urology

## 2012-06-30 DIAGNOSIS — N1330 Unspecified hydronephrosis: Secondary | ICD-10-CM | POA: Insufficient documentation

## 2012-06-30 DIAGNOSIS — I2510 Atherosclerotic heart disease of native coronary artery without angina pectoris: Secondary | ICD-10-CM | POA: Insufficient documentation

## 2012-06-30 DIAGNOSIS — I10 Essential (primary) hypertension: Secondary | ICD-10-CM | POA: Insufficient documentation

## 2012-06-30 DIAGNOSIS — Z950 Presence of cardiac pacemaker: Secondary | ICD-10-CM | POA: Insufficient documentation

## 2012-06-30 DIAGNOSIS — N201 Calculus of ureter: Secondary | ICD-10-CM | POA: Insufficient documentation

## 2012-06-30 DIAGNOSIS — Z7901 Long term (current) use of anticoagulants: Secondary | ICD-10-CM | POA: Insufficient documentation

## 2012-06-30 DIAGNOSIS — E785 Hyperlipidemia, unspecified: Secondary | ICD-10-CM | POA: Insufficient documentation

## 2012-06-30 DIAGNOSIS — N2889 Other specified disorders of kidney and ureter: Secondary | ICD-10-CM | POA: Insufficient documentation

## 2012-06-30 HISTORY — PX: CYSTOSCOPY WITH RETROGRADE PYELOGRAM, URETEROSCOPY AND STENT PLACEMENT: SHX5789

## 2012-06-30 SURGERY — CYSTOURETEROSCOPY, WITH RETROGRADE PYELOGRAM AND STENT INSERTION
Anesthesia: General | Site: Ureter | Laterality: Left | Wound class: Clean Contaminated

## 2012-06-30 MED ORDER — MEPERIDINE HCL 50 MG/ML IJ SOLN: 6.2500 mg | INTRAMUSCULAR | Status: DC | PRN

## 2012-06-30 MED ORDER — PROMETHAZINE HCL 25 MG/ML IJ SOLN: 6.2500 mg | INTRAMUSCULAR | Status: DC | PRN

## 2012-06-30 MED ORDER — SODIUM CHLORIDE 0.9 % IR SOLN
Status: DC | PRN
  Administered 2012-06-30: 4000 mL

## 2012-06-30 MED ORDER — FENTANYL CITRATE 0.05 MG/ML IJ SOLN
INTRAMUSCULAR | Status: DC | PRN
  Administered 2012-06-30 (×5): 25 ug via INTRAVENOUS
  Administered 2012-06-30: 50 ug via INTRAVENOUS
  Administered 2012-06-30: 25 ug via INTRAVENOUS

## 2012-06-30 MED ORDER — SULFAMETHOXAZOLE-TRIMETHOPRIM 800-160 MG PO TABS: 1.0000 | ORAL_TABLET | Freq: Every day | ORAL | Status: DC

## 2012-06-30 MED ORDER — TRAMADOL-ACETAMINOPHEN 37.5-325 MG PO TABS: 1.0000 | ORAL_TABLET | Freq: Four times a day (QID) | ORAL | Status: DC | PRN

## 2012-06-30 MED ORDER — LACTATED RINGERS IV SOLN
INTRAVENOUS | Status: DC
  Administered 2012-06-30: 12:00:00 via INTRAVENOUS
  Administered 2012-06-30: 1000 mL via INTRAVENOUS

## 2012-06-30 MED ORDER — ONDANSETRON HCL 4 MG/2ML IJ SOLN
INTRAMUSCULAR | Status: DC | PRN
  Administered 2012-06-30: 4 mg via INTRAVENOUS

## 2012-06-30 MED ORDER — FENTANYL CITRATE 0.05 MG/ML IJ SOLN
25.0000 ug | INTRAMUSCULAR | Status: DC | PRN
  Administered 2012-06-30: 50 ug via INTRAVENOUS

## 2012-06-30 MED ORDER — LIDOCAINE HCL 2 % EX GEL
CUTANEOUS | Status: AC
  Filled 2012-06-30: qty 10

## 2012-06-30 MED ORDER — LIDOCAINE HCL (CARDIAC) 20 MG/ML IV SOLN
INTRAVENOUS | Status: DC | PRN
  Administered 2012-06-30: 50 mg via INTRAVENOUS

## 2012-06-30 MED ORDER — IOHEXOL 300 MG/ML  SOLN
INTRAMUSCULAR | Status: AC
  Filled 2012-06-30: qty 1

## 2012-06-30 MED ORDER — SENNA-DOCUSATE SODIUM 8.6-50 MG PO TABS: 1.0000 | ORAL_TABLET | Freq: Two times a day (BID) | ORAL | Status: DC

## 2012-06-30 MED ORDER — IOHEXOL 300 MG/ML  SOLN
INTRAMUSCULAR | Status: DC | PRN
  Administered 2012-06-30: 30 mL

## 2012-06-30 MED ORDER — PROPOFOL 10 MG/ML IV BOLUS
INTRAVENOUS | Status: DC | PRN
  Administered 2012-06-30: 150 mg via INTRAVENOUS

## 2012-06-30 MED ORDER — FENTANYL CITRATE 0.05 MG/ML IJ SOLN
INTRAMUSCULAR | Status: AC
  Filled 2012-06-30: qty 2

## 2012-06-30 MED ORDER — MIDAZOLAM HCL 5 MG/5ML IJ SOLN
INTRAMUSCULAR | Status: DC | PRN
  Administered 2012-06-30: 1 mg via INTRAVENOUS

## 2012-06-30 SURGICAL SUPPLY — 24 items
BAG URINE DRAINAGE (UROLOGICAL SUPPLIES) ×1 IMPLANT
BASKET LASER NITINOL 1.9FR (BASKET) ×2 IMPLANT
BASKET STNLS GEMINI 4WIRE 3FR (BASKET) IMPLANT
BASKET ZERO TIP NITINOL 2.4FR (BASKET) IMPLANT
BSKT STON RTRVL 120 1.9FR (BASKET) ×2
BSKT STON RTRVL GEM 120X11 3FR (BASKET)
BSKT STON RTRVL ZERO TP 2.4FR (BASKET)
CATH INTERMIT  6FR 70CM (CATHETERS) ×3 IMPLANT
DRAPE CAMERA CLOSED 9X96 (DRAPES) ×3 IMPLANT
ELECT LOOP MED HF 24F 12D CBL (CLIP) ×2 IMPLANT
ELECT REM PT RETURN 9FT ADLT (ELECTROSURGICAL)
ELECTRODE REM PT RTRN 9FT ADLT (ELECTROSURGICAL) IMPLANT
GLOVE BIOGEL M STRL SZ7.5 (GLOVE) ×3 IMPLANT
GOWN PREVENTION PLUS LG XLONG (DISPOSABLE) ×3 IMPLANT
GUIDEWIRE ANG ZIPWIRE 038X150 (WIRE) ×3 IMPLANT
GUIDEWIRE STR DUAL SENSOR (WIRE) ×3 IMPLANT
IV NS IRRIG 3000ML ARTHROMATIC (IV SOLUTION) ×3 IMPLANT
LASER FIBER DISP (UROLOGICAL SUPPLIES) IMPLANT
PACK CYSTO (CUSTOM PROCEDURE TRAY) ×3 IMPLANT
SHEATH ACCESS URETERAL 24CM (SHEATH) ×2 IMPLANT
STENT CONTOUR 6FRX24X.038 (STENTS) ×2 IMPLANT
SYRINGE 10CC LL (SYRINGE) IMPLANT
SYRINGE IRR TOOMEY STRL 70CC (SYRINGE) IMPLANT
TUBE FEEDING 8FR 16IN STR KANG (MISCELLANEOUS) ×3 IMPLANT

## 2012-06-30 NOTE — Preoperative (Signed)
Beta Blockers   Reason not to administer Beta Blockers:Not Applicable 

## 2012-06-30 NOTE — Transfer of Care (Signed)
Immediate Anesthesia Transfer of Care Note  Patient: Denetra Formoso Holcomb  Procedure(s) Performed: Procedure(s): CYSTOSCOPY WITH LEFT  RETROGRADE PYELOGRAM, URETEROSCOPY  with basketing of stone, AND STENT PLACEMENT, TRANSURETHRAL UNROOFING OF URETER. (Left)  Patient Location: PACU  Anesthesia Type:General  Level of Consciousness: awake, alert  and patient cooperative  Airway & Oxygen Therapy: Patient Spontanous Breathing and Patient connected to face mask oxygen  Post-op Assessment: Report given to PACU RN and Post -op Vital signs reviewed and stable  Post vital signs: Reviewed and stable  Complications: No apparent anesthesia complications

## 2012-06-30 NOTE — Anesthesia Preprocedure Evaluation (Addendum)
Anesthesia Evaluation  Patient identified by MRN, date of birth, ID band Patient awake    Reviewed: Allergy & Precautions, H&P , NPO status , Patient's Chart, lab work & pertinent test results  Airway Mallampati: II TM Distance: >3 FB Neck ROM: Full    Dental no notable dental hx.    Pulmonary neg pulmonary ROS,  breath sounds clear to auscultation  Pulmonary exam normal       Cardiovascular hypertension, Pt. on medications + CAD + dysrhythmias Atrial Fibrillation + pacemaker Rhythm:Regular Rate:Normal     Neuro/Psych negative neurological ROS  negative psych ROS   GI/Hepatic negative GI ROS, Neg liver ROS,   Endo/Other  negative endocrine ROS  Renal/GU negative Renal ROS  negative genitourinary   Musculoskeletal negative musculoskeletal ROS (+)   Abdominal   Peds negative pediatric ROS (+)  Hematology negative hematology ROS (+)   Anesthesia Other Findings   Reproductive/Obstetrics negative OB ROS                           Anesthesia Physical Anesthesia Plan  ASA: III  Anesthesia Plan: General   Post-op Pain Management:    Induction: Intravenous  Airway Management Planned: LMA  Additional Equipment:   Intra-op Plan:   Post-operative Plan: Extubation in OR  Informed Consent: I have reviewed the patients History and Physical, chart, labs and discussed the procedure including the risks, benefits and alternatives for the proposed anesthesia with the patient or authorized representative who has indicated his/her understanding and acceptance.   Dental advisory given  Plan Discussed with: CRNA  Anesthesia Plan Comments:         Anesthesia Quick Evaluation  

## 2012-06-30 NOTE — Brief Op Note (Signed)
06/30/2012  12:32 PM  PATIENT:  Traci [name]  78 y.o. female  PRE-OPERATIVE DIAGNOSIS:  LEFT IMPACTED URETERAL STONE  POST-OPERATIVE DIAGNOSIS:  left impacted ureteral stone  PROCEDURE:  Procedure(s): CYSTOSCOPY WITH LEFT  RETROGRADE PYELOGRAM, URETEROSCOPY  with basketing of stone, AND STENT PLACEMENT, TRANSURETHRAL UNROOFING OF URETER. (Left)  SURGEON:  Surgeon(s) and Role:    * Sebastian Ache, MD - Primary  PHYSICIAN ASSISTANT:   ASSISTANTS: none   ANESTHESIA:   general  EBL:  Total I/O In: 1000 [I.V.:1000] Out: -   BLOOD ADMINISTERED:none  DRAINS: none   LOCAL MEDICATIONS USED:  NONE  SPECIMEN:  Source of Specimen:  1 - Left Distal Ureter (intramural) fragements, 2- Left Renal stone  DISPOSITION OF SPECIMEN:  PATHOLOGY  COUNTS:  YES  TOURNIQUET:  * No tourniquets in log *  DICTATION: .Other Dictation: Dictation Number  [id number]  PLAN OF CARE: Discharge to home after PACU  PATIENT DISPOSITION:  PACU - hemodynamically stable.   Delay start of Pharmacological VTE agent (>24hrs) due to surgical blood loss or risk of bleeding: yes

## 2012-06-30 NOTE — Anesthesia Postprocedure Evaluation (Signed)
  Anesthesia Post-op Note  Patient: Traci [name]  Procedure(s) Performed: Procedure(s) (LRB): CYSTOSCOPY WITH LEFT  RETROGRADE PYELOGRAM, URETEROSCOPY  with basketing of stone, AND STENT PLACEMENT, TRANSURETHRAL UNROOFING OF URETER. (Left)  Patient Location: PACU  Anesthesia Type: General  Level of Consciousness: awake and alert   Airway and Oxygen Therapy: Patient Spontanous Breathing  Post-op Pain: mild  Post-op Assessment: Post-op Vital signs reviewed, Patient's Cardiovascular Status Stable, Respiratory Function Stable, Patent Airway and No signs of Nausea or vomiting  Last Vitals:  Filed Vitals:   06/30/12 1345  BP: 143/66  Pulse: 63  Temp:   Resp: 12    Post-op Vital Signs: stable   Complications: No apparent anesthesia complications

## 2012-06-30 NOTE — H&P (Signed)
Traci [name] is an 78 y.o. female.   Chief Complaint: Pre-Op Endoscopic Unroofing Distal Ureteral Stone  HPI:   1 - "Ingrown" Left Distal Ureteral Stone / Chronic Hydronephrosis- Pt s/p cysto and left ureteral stent 03/2012 for left distal 7mm ureteral stone and hydro found on w/u for colicky left flank pain. Stone unable to be visualized and likely ingrown into area of medial UVJ by CT. Renogram 04/2012 confirms preserved function at 40% left / 60% right with stent in place.  Likely chronic process as left hydro noted on imaging for over 1 year. Cr <1 pre and post stenting.  PMH sig for AFib (on comadin, has come off for other procedures), CHF (pacemaker, digoxin), CEA, Appy.  Today Traci [name] is seen to proceed with attempt of transurethral unroofing of her impacted left distal ureteral stone. No interval fevers. She had pre-op cardiac clearance and has been off coumadin.  Past Medical History  Diagnosis Date  . Sick sinus syndrome     PAF in 8,9,10/2003,1/06;insignificant CAD in  08/2003  . Sick sinus syndrome     ddd pacing medtronic rhythm 05/2004   . Chronic anticoagulation     followed by PMD  . Hyperlipidemia   . Hypertension   . Cerebrovascular disease     right carotid endarectomy in 1999;negative duplex in 06/2004  . Pneumonia     bilateral pneumonia 2010  . Hyperglycemia     fasting  . Coronary artery disease   . Arthritis   . Pacemaker   . History of kidney stones     Past Surgical History  Procedure Laterality Date  . Appendectomy    . Abdominal hysterectomy    . Right carotid endarectomy      1999  . Pacemaker placement      2006  . Colonoscopy      2006  . Cystoscopy w/ ureteral stent placement Left 04/19/2012    Procedure: CYSTOSCOPY WITH RETROGRADE PYELOGRAM/URETERAL STENT PLACEMENT ;  Surgeon: Kathi Ludwig, MD;  Location: [facility];  Service: Urology;  Laterality: Left;  . Breast biopsy Bilateral     x 7 total  . Eye surgery Bilateral     cataract  extraction with IOL    Family History  Problem Relation Age of Onset  . Heart attack Mother     Died age 28  . Coronary artery disease Father   . Diabetes Sister   . Hypertension Sister   . Cancer Sister     Breast  . Cancer Sister     Breast   Social History:  reports that she has never smoked. She has never used smokeless tobacco. She reports that she does not drink alcohol or use illicit drugs.  Allergies:  Allergies  Allergen Reactions  . Morphine Nausea Only  . Penicillins     No prescriptions prior to admission    Results for orders placed during the hospital encounter of 06/28/12 (from the past 48 hour(s))  SURGICAL PCR SCREEN     Status: None   Collection Time    06/28/12  2:04 PM      Result Value Range   MRSA, PCR NEGATIVE  NEGATIVE   Staphylococcus aureus NEGATIVE  NEGATIVE   Comment:            The Xpert SA Assay (FDA     approved for NASAL specimens     in patients over 11 years of age),     is one component of  a comprehensive surveillance     program.  Test performance has     been validated by [facility] for patients greater     than or equal to 11 year old.     It is not intended     to diagnose infection nor to     guide or monitor treatment.  CBC     Status: None   Collection Time    06/28/12  3:00 PM      Result Value Range   WBC 9.9  4.0 - 10.5 K/uL   RBC 4.20  3.87 - 5.11 MIL/uL   Hemoglobin 13.5  12.0 - 15.0 g/dL   HCT 45.4  09.8 - 11.9 %   MCV 96.7  78.0 - 100.0 fL   MCH 32.1  26.0 - 34.0 pg   MCHC 33.3  30.0 - 36.0 g/dL   RDW 14.7  82.9 - 56.2 %   Platelets 280  150 - 400 K/uL  BASIC METABOLIC PANEL     Status: Abnormal   Collection Time    06/28/12  3:00 PM      Result Value Range   Sodium 139  135 - 145 mEq/L   Potassium 3.9  3.5 - 5.1 mEq/L   Chloride 102  96 - 112 mEq/L   CO2 30  19 - 32 mEq/L   Glucose, Bld 127 (*) 70 - 99 mg/dL   BUN 18  6 - 23 mg/dL   Creatinine, Ser 1.30  0.50 - 1.10 mg/dL   Calcium 9.9   8.4 - 86.5 mg/dL   GFR calc non Af Amer 60 (*) >90 mL/min   GFR calc Af Amer 70 (*) >90 mL/min   Comment:            The eGFR has been calculated     using the CKD EPI equation.     This calculation has not been     validated in all clinical     situations.     eGFR's persistently     <90 mL/min signify     possible Chronic Kidney Disease.  PROTIME-INR     Status: None   Collection Time    06/28/12  3:00 PM      Result Value Range   Prothrombin Time 15.1  11.6 - 15.2 seconds   INR 1.21  0.00 - 1.49  APTT     Status: None   Collection Time    06/28/12  3:00 PM      Result Value Range   aPTT 30  24 - 37 seconds   Dg Chest 2 View  06/28/2012   *RADIOLOGY REPORT*  Clinical Data: Preoperative for exam for cystoscopy and ureteroscopy.  The six sinus syndrome.  CHEST - 2 VIEW  Comparison: 12/27/2008  Findings: Moderate cardiomegaly is stable.  Both lungs are clear. No evidence of pleural effusion.  Dual lead transvenous pacemaker remains in appropriate position.  IMPRESSION: Stable cardiomegaly.  No active lung disease.   Original Report Authenticated By: Myles Rosenthal, M.D.    Review of Systems  Constitutional: Negative.  Negative for fever and chills.  HENT: Negative.   Eyes: Negative.   Respiratory: Negative.   Cardiovascular: Negative.   Gastrointestinal: Negative.   Genitourinary: Negative.   Musculoskeletal: Negative.   Skin: Negative.   Neurological: Negative.   Endo/Heme/Allergies: Negative.   Psychiatric/Behavioral: Negative.     There were no vitals taken for this visit. Physical Exam  Constitutional: She is oriented to person, place, and time. She appears well-developed and well-nourished.  HENT:  Head: Normocephalic and atraumatic.  Eyes: EOM are normal. Pupils are equal, round, and reactive to light.  Neck: Normal range of motion. Neck supple.  Cardiovascular: Normal rate and regular rhythm.   Respiratory: Effort normal and breath sounds normal.  GI: Soft. Bowel  sounds are normal.  Genitourinary:  No CVAT  Musculoskeletal: Normal range of motion.  Neurological: She is alert and oriented to person, place, and time.  Skin: Skin is warm and dry.  Psychiatric: She has a normal mood and affect. Her behavior is normal. Judgment and thought content normal.     Assessment/Plan  1 - "Ingrown" Left Distal Ureteral Stone / Chronic Hydronephrosis - Very unususual case wtih likely chronic left distal ureteral stone that has growin into intramural left distal ureteral wall. She wants to proceed with attempt endoscopic unroofing as previously discussed.  We rediscussed risks including bleeding, infection, damage to kidney / ureter  bladder, rarely loss of kidney. We rediscussed anesthetic risks and rare but serious surgical complications including DVT, PE, MI, and mortality. I again quoted approximately 50% success rate with this less invasive option compared to open or robotic approach. The patient voiced understanding and wises to proceed.     Natash Berman 06/30/2012, 6:32 AM

## 2012-07-01 ENCOUNTER — Encounter (HOSPITAL_COMMUNITY): Payer: Self-pay | Admitting: Urology

## 2012-07-01 NOTE — Op Note (Signed)
NAME:  Traci [name], Traci [name] NO.:  [id number]  MEDICAL RECORD NO.:  [id number]  LOCATION:                                FACILITY:  [facility]  PHYSICIAN:  Sebastian Ache, MD     DATE OF BIRTH:  September 11, 1934  DATE OF PROCEDURE:  06/30/2012 DATE OF DISCHARGE:                              OPERATIVE REPORT   DIAGNOSIS:  Left hydronephrosis with impacted left ureterovesical junction stone.  PROCEDURE: 1. Cystoscopy with left retrograde pyelogram. 2. Left ureteroscopy with basketing of ureteral stone. 3. Transurethral unroofing of distal ureter. 4. Placement of left ureteral stent, 6 x 24, no tether.  FINDINGS: 1. Tiny approximately 2 mm left intrarenal stone.  This was removed in     its entirety. 2. Area of significant papillary edema and questionable extrinsic     compression at the suspicious area of left distal ureter.  This     area was completely resected. 3. Creation of a neo-ureteral orifice approximately 3 cm proximal to     the original left ureteral orifice.  SPECIMEN: 1. Fragments of left distal ureter for permanent pathology. 2. Left intrarenal stone, discard; this was photographed.  INDICATION:  Traci [name] is a pleasant 78 year old lady, who was found on workup with left colicky flank pain, left hydronephrosis, and suspected left UPJ stone.  She underwent attempted left ureteroscopic stone manipulation approximately a month and half ago and the left distal stone was unable to be visualized.  There was a suspect that it had become impacted and grown into the very distal ureter on the medial aspect  based on x-rays.  Ureteral stent was placed.  Procedure was temporized with this and referred for consideration of further management.  Options were discussed including repeat endoscopic exam with attempted ureteroscopic removal versus unroofing of this ureter versus more aggressive therapy with left ureteral reimplant and she wished to proceed with a trial of  endoscopic management.  Informed consents was obtained and placed in medical record.  PROCEDURE IN DETAIL:  The patient being Traci [name], procedure being left ureteroscopy, retrieval of ureteral stone was confirmed.  Procedure was carried out.  Time-out was performed.  Intravenous antibiotics were administered.  General LMA anesthesia was introduced.  The patient was placed into a low lithotomy position.  Sterile field was created by prepping and draping the patient's vagina, introitus, and proximal thighs using iodine x3.  Next, cystourethroscopy was performed using 22- French rigid cystoscope with 12-degree offset lens.  Inspection of the urinary bladder revealed distal end of the left ureteral stent in situ. There was significant papillary edema around this.  No trabeculation, no stones were noted.  The right ureteral orifice was in its normal anatomic position. Next thedistal end of the left ureteral stent was brought to the level of the urethral meatus at which point 0.038 Glidewire was advanced to the level of renal pelvis, this was exchanged for renal catheter, and left retrograde pyelogram was obtained.  Left retrograde pyelogram due to single left ureter, single system left kidney.  No filling defects or narrowing were noted.  The Glidewire was reintroduced using a safety wire.  Next, semi-rigid ureteroscopy was  performed in the distal 2/3rd to the ureter.  Using a 6-French semi- rigid ureteroscope over a separate Sensor working wire and normal saline irrigation under pressure.  This revealed a questionable subtle extrinsic compression in the distal most ureter consistent with possible impacted stone.  No other areas of narrowing or mucosal abnormalities were found.  The end-hole catheter was once again advanced up the ureter over the safety wire and set aside as a safety wire self.  Attention was then directed to transurethral unroofing of the distal ureter with goal of  retrieving this distal impacted stone.  The gyrus bipolar resectoscope was then used with medium resectoscope loop and careful resection was performed in top-down orientation and all edematous in distal ureteral tissue on the superior medial aspect of the ureter was carefully resected for a distance approximately 3 cm creating a neo- ureteral orifice in this location.  Great care was taken to avoid perforation of the bladder and this did not occur.  During these maneuvers, no obvious stone was encountered.  These tissue fragments were set aside for permanent pathology and removed the left distal ureter and bladder fragments.  The neo-ureteral orifice appeared to be quite capacious without any obvious narrowing or extrinsic compression. Repeat ureteroscopy at this time using 8-French flexible ureteroscope was performed of the entire length of left ureter including the left renal pelvis.  A single tiny papillary tip calcification was noted in the renal pelvis and grasped with the escape basket right at its entirety.  As there is no apparent narrowing anywhere in the ureter, it was felt that we had achieved the goals of the procedure today and hemostasis appeared excellent.  No signs of perforation visible.  Finally, the Glidewire was reintroduced via the end-hole catheter, and a new 6 x 24 double-J stent was placed using cystoscopic and fluoroscopic guidance.  Good proximal and distal curl were noted.  Bladder was emptied per cystoscope. Procedure was then terminated.  The patient tolerated the procedure well.  There were no immediate periprocedural complications.  The patient was taken to the postanesthesia care unit in stable condition.          ______________________________ Sebastian Ache, MD     TM/MEDQ  D:  06/30/2012  T:  07/01/2012  Job:  [id number]

## 2012-07-19 NOTE — Addendum Note (Signed)
Addendum created 07/19/12 0750 by Phillips Grout, MD   Modules edited: Anesthesia Events

## 2012-07-26 ENCOUNTER — Ambulatory Visit (INDEPENDENT_AMBULATORY_CARE_PROVIDER_SITE_OTHER): Payer: Medicare Other | Admitting: *Deleted

## 2012-07-26 DIAGNOSIS — I4891 Unspecified atrial fibrillation: Secondary | ICD-10-CM

## 2012-07-26 DIAGNOSIS — Z950 Presence of cardiac pacemaker: Secondary | ICD-10-CM

## 2012-08-04 LAB — REMOTE PACEMAKER DEVICE
BATTERY VOLTAGE: 2.77 V
BRDY-0002RV: 60 {beats}/min
BRDY-0003RV: 130 {beats}/min
VENTRICULAR PACING PM: 4

## 2012-08-06 ENCOUNTER — Encounter: Payer: Self-pay | Admitting: *Deleted

## 2012-09-17 ENCOUNTER — Encounter: Payer: Self-pay | Admitting: *Deleted

## 2012-10-06 ENCOUNTER — Ambulatory Visit (INDEPENDENT_AMBULATORY_CARE_PROVIDER_SITE_OTHER): Payer: Medicare Other | Admitting: Internal Medicine

## 2012-10-06 ENCOUNTER — Encounter: Payer: Self-pay | Admitting: Internal Medicine

## 2012-10-06 VITALS — BP 118/80 | HR 74 | Ht 64.0 in | Wt 141.0 lb

## 2012-10-06 DIAGNOSIS — I4891 Unspecified atrial fibrillation: Secondary | ICD-10-CM

## 2012-10-06 DIAGNOSIS — Z950 Presence of cardiac pacemaker: Secondary | ICD-10-CM

## 2012-10-06 LAB — PACEMAKER DEVICE OBSERVATION
AL AMPLITUDE: 0.35 mv
AL IMPEDENCE PM: 498 Ohm
ATRIAL PACING PM: 5
BAMS-0001: 150 {beats}/min
BATTERY VOLTAGE: 2.77 V
BRDY-0002RV: 60 {beats}/min
BRDY-0003RV: 130 {beats}/min
BRDY-0004RV: 130 {beats}/min
RV LEAD AMPLITUDE: 8 mv
RV LEAD IMPEDENCE PM: 568 Ohm
RV LEAD THRESHOLD: 0.75 V
VENTRICULAR PACING PM: 5

## 2012-10-06 NOTE — Patient Instructions (Signed)
Your physician recommends that you schedule a follow-up appointment in: 12 months with Dr Ladona Ridgel and Melburn Hake on 12/15

## 2012-10-06 NOTE — Progress Notes (Signed)
HPI Traci [name] returns today for followup. She is a 78 year old woman with chronic atrial fibrillation, hypertension, and symptomatic tachycardia bradycardia syndrome, status post permanent pacemaker insertion. In the interim, she has done well. She denies chest pain, shortness of breath, or syncope. No peripheral edema. Her palpitations are markedly improved. Allergies  Allergen Reactions  . Morphine Nausea Only  . Penicillins      Current Outpatient Prescriptions  Medication Sig Dispense Refill  . amLODipine (NORVASC) 5 MG tablet Take 5 mg by mouth every morning.       . Ascorbic Acid (VITAMIN C) 1000 MG tablet Take 1,000 mg by mouth 2 (two) times daily.       . Calcium Carbonate-Vit D-Min (CALCIUM 1200 PO) Take 1 capsule by mouth 2 (two) times daily.       . cholecalciferol (VITAMIN D) 400 UNITS TABS Take 400 Units by mouth every morning.      . digoxin (LANOXIN) 0.125 MG tablet Take 125 mcg by mouth every morning.      . diphenhydrAMINE (BENADRYL) 25 MG tablet Take 25 mg by mouth at bedtime.      . fish oil-omega-3 fatty acids 1000 MG capsule Take 1 g by mouth every morning.       Marland Kitchen LECITHIN PO Take 1,200 mg by mouth daily.       Marland Kitchen LORazepam (ATIVAN) 1 MG tablet Take 1 mg by mouth at bedtime.       Marland Kitchen losartan (COZAAR) 50 MG tablet Take 50 mg by mouth every morning.      . Melatonin 10 MG TABS Take 10 mg by mouth at bedtime.      . nebivolol (BYSTOLIC) 10 MG tablet Take 10 mg by mouth every morning.      . phenazopyridine (PYRIDIUM) 200 MG tablet Take 200 mg by mouth 3 (three) times daily as needed for pain.      Marland Kitchen sennosides-docusate sodium (SENOKOT-S) 8.6-50 MG tablet Take 1 tablet by mouth 2 (two) times daily. While taking pain meds to prevent constipation.  30 tablet  1  . warfarin (COUMADIN) 5 MG tablet Take 2.5 mg by mouth every morning.      . niacin 250 MG tablet Take 250 mg by mouth daily.       No current facility-administered medications for this visit.     Past Medical  History  Diagnosis Date  . Sick sinus syndrome     PAF in 8,9,10/2003,1/06;insignificant CAD in  08/2003  . Sick sinus syndrome     ddd pacing medtronic rhythm 05/2004   . Chronic anticoagulation     followed by PMD  . Hyperlipidemia   . Hypertension   . Cerebrovascular disease     right carotid endarectomy in 1999;negative duplex in 06/2004  . Pneumonia     bilateral pneumonia 2010  . Hyperglycemia     fasting  . Coronary artery disease   . Arthritis   . Pacemaker   . History of kidney stones     ROS:   All systems reviewed and negative except as noted in the HPI.   Past Surgical History  Procedure Laterality Date  . Appendectomy    . Abdominal hysterectomy    . Right carotid endarectomy      1999  . Pacemaker placement      2006  . Colonoscopy      2006  . Cystoscopy w/ ureteral stent placement Left 04/19/2012    Procedure: CYSTOSCOPY WITH RETROGRADE PYELOGRAM/URETERAL STENT  PLACEMENT ;  Surgeon: Kathi Ludwig, MD;  Location: [facility];  Service: Urology;  Laterality: Left;  . Breast biopsy Bilateral     x 7 total  . Eye surgery Bilateral     cataract extraction with IOL  . Cystoscopy with retrograde pyelogram, ureteroscopy and stent placement Left 06/30/2012    Procedure: CYSTOSCOPY WITH LEFT  RETROGRADE PYELOGRAM, URETEROSCOPY  with basketing of stone, AND STENT PLACEMENT, TRANSURETHRAL UNROOFING OF URETER.;  Surgeon: Sebastian Ache, MD;  Location: [facility];  Service: Urology;  Laterality: Left;     Family History  Problem Relation Age of Onset  . Heart attack Mother     Died age 20  . Coronary artery disease Father   . Diabetes Sister   . Hypertension Sister   . Cancer Sister     Breast  . Cancer Sister     Breast     History   Social History  . Marital Status: Married    Spouse Name: N/A    Number of Children: N/A  . Years of Education: N/A   Occupational History  . Not on file.   Social History Main Topics  . Smoking status: Never Smoker    . Smokeless tobacco: Never Used  . Alcohol Use: No  . Drug Use: No  . Sexual Activity: No   Other Topics Concern  . Not on file   Social History Narrative  . No narrative on file     BP 118/80  Pulse 74  Ht 5\' 4"  (1.626 m)  Wt 141 lb (63.957 kg)  BMI 24.19 kg/m2  SpO2 95%  Physical Exam:  Well appearing 78 year old woman, NAD HEENT: Unremarkable Neck:  No JVD, no thyromegally Back:  No CVA tenderness Lungs:  Clear with no wheezes, rales, or rhonchi. HEART:  Regular rate rhythm, no murmurs, no rubs, no clicks Abd:  soft, positive bowel sounds, no organomegally, no rebound, no guarding Ext:  2 plus pulses, no edema, no cyanosis, no clubbing Skin:  No rashes no nodules Neuro:  CN II through XII intact, motor grossly intact  DEVICE  Normal device function.  See PaceArt for details.   Assess/Plan:

## 2012-10-06 NOTE — Assessment & Plan Note (Signed)
Her Medtronic dual-chamber pacemaker is working normally. We'll plan to recheck in several months. 

## 2012-10-06 NOTE — Assessment & Plan Note (Signed)
Her ventricular rate appears to be reasonably well controlled. She is certainly not symptomatic. We'll continue her current medical therapy.

## 2012-10-13 ENCOUNTER — Other Ambulatory Visit: Payer: Self-pay

## 2012-10-13 MED ORDER — AMLODIPINE BESYLATE 5 MG PO TABS: 5.0000 mg | ORAL_TABLET | Freq: Every morning | ORAL | Status: DC

## 2012-10-27 ENCOUNTER — Encounter: Payer: Self-pay | Admitting: Internal Medicine

## 2013-01-10 ENCOUNTER — Encounter: Payer: Self-pay | Admitting: Internal Medicine

## 2013-01-10 ENCOUNTER — Ambulatory Visit (INDEPENDENT_AMBULATORY_CARE_PROVIDER_SITE_OTHER): Payer: Medicare Other | Admitting: *Deleted

## 2013-01-10 DIAGNOSIS — I4891 Unspecified atrial fibrillation: Secondary | ICD-10-CM

## 2013-01-10 DIAGNOSIS — Z950 Presence of cardiac pacemaker: Secondary | ICD-10-CM

## 2013-01-10 LAB — MDC_IDC_ENUM_SESS_TYPE_REMOTE
Brady Statistic AP VS Percent: 1 %
Brady Statistic AS VP Percent: 0 %
Brady Statistic AS VS Percent: 95 %
Date Time Interrogation Session: 20141215153530
Lead Channel Impedance Value: 478 Ohm
Lead Channel Impedance Value: 552 Ohm
Lead Channel Pacing Threshold Amplitude: 0.75 V
Lead Channel Pacing Threshold Pulse Width: 0.4 ms
Lead Channel Sensing Intrinsic Amplitude: 1.4 mV
Lead Channel Sensing Intrinsic Amplitude: 22.4 mV
Lead Channel Setting Pacing Amplitude: 2.75 V

## 2013-01-24 ENCOUNTER — Encounter: Payer: Self-pay | Admitting: *Deleted

## 2013-03-14 ENCOUNTER — Encounter: Payer: Self-pay | Admitting: *Deleted

## 2013-03-14 ENCOUNTER — Encounter: Payer: Self-pay | Admitting: Family Medicine

## 2013-04-04 ENCOUNTER — Other Ambulatory Visit: Payer: Medicare Other

## 2013-04-06 ENCOUNTER — Encounter: Payer: Self-pay | Admitting: *Deleted

## 2013-04-06 ENCOUNTER — Ambulatory Visit (INDEPENDENT_AMBULATORY_CARE_PROVIDER_SITE_OTHER): Payer: Medicare Other | Admitting: Family Medicine

## 2013-04-06 ENCOUNTER — Encounter: Payer: Self-pay | Admitting: Family Medicine

## 2013-04-06 VITALS — BP 130/74 | HR 66 | Temp 97.9°F | Resp 18 | Ht 63.0 in | Wt 154.0 lb

## 2013-04-06 DIAGNOSIS — Z5181 Encounter for therapeutic drug level monitoring: Secondary | ICD-10-CM

## 2013-04-06 DIAGNOSIS — E785 Hyperlipidemia, unspecified: Secondary | ICD-10-CM

## 2013-04-06 DIAGNOSIS — I2510 Atherosclerotic heart disease of native coronary artery without angina pectoris: Secondary | ICD-10-CM

## 2013-04-06 DIAGNOSIS — I4891 Unspecified atrial fibrillation: Secondary | ICD-10-CM

## 2013-04-06 DIAGNOSIS — Z7901 Long term (current) use of anticoagulants: Secondary | ICD-10-CM

## 2013-04-06 DIAGNOSIS — R5381 Other malaise: Secondary | ICD-10-CM

## 2013-04-06 DIAGNOSIS — Z23 Encounter for immunization: Secondary | ICD-10-CM

## 2013-04-06 DIAGNOSIS — I10 Essential (primary) hypertension: Secondary | ICD-10-CM

## 2013-04-06 DIAGNOSIS — E538 Deficiency of other specified B group vitamins: Secondary | ICD-10-CM

## 2013-04-06 DIAGNOSIS — R5383 Other fatigue: Secondary | ICD-10-CM

## 2013-04-06 DIAGNOSIS — M1990 Unspecified osteoarthritis, unspecified site: Secondary | ICD-10-CM

## 2013-04-06 LAB — PT WITH INR/FINGERSTICK
INR, fingerstick: 2 — ABNORMAL HIGH (ref 0.80–1.20)
PT, fingerstick: 24.1 seconds — ABNORMAL HIGH (ref 10.4–12.5)

## 2013-04-06 NOTE — Assessment & Plan Note (Signed)
Blood pressure looks okay today I will have her cut the losartan in half

## 2013-04-06 NOTE — Assessment & Plan Note (Signed)
INR at goal, recheck every 4 weeks

## 2013-04-06 NOTE — Assessment & Plan Note (Signed)
Needs statin therapy, check FLP, planb to start lipitor generic

## 2013-04-06 NOTE — Patient Instructions (Addendum)
Take 1/2 tablet of the Losartan  Get the labs done fasting  Coumadin check in 4 weeks Prevnar 13 shot  F/U 3 months

## 2013-04-06 NOTE — Assessment & Plan Note (Signed)
Followed by cardiology on bystolic for rate control as well as digoxin

## 2013-04-06 NOTE — Progress Notes (Signed)
Patient ID: Traci [name], female   DOB: May 31, 1934, 79 y.o.   MRN: [id number]   Subjective:    Patient ID: Traci [name], female    DOB: 06/13/1934, 79 y.o.   MRN: [id number]  Patient presents for New Patient CPE and fatigue  patient here to establish care. Her previous PCP is Dr. Cindie Laroche, cardiology- Dr. Lovena Le  Medications and history reviewed She is a long-standing history of atrial fibrillation she is on Coumadin therapy she's had A. fib for at least 8 years. Her chart also has coronary artery disease but she was not aware of this. She is due for an INR check today. She also has a pacer. History of chronic artery disease as well as she is status post carotid endarterectomy in the past. She was on Crestor however she stopped the medication on her own thinking she could control her lipids with her diet. She complains of chronic fatigue states it is worsened over the past couple of years she thinks that is her medications causing the fatigue as she remembers starting losartan a year or so ago and has been more fatigued since then but states every time she tries to tell her physician date ignor this. She is also on amlodipine and bystolic  On review medication she did have lorazepam as well as clonazepam on her chart but states that she stopped these medications and is using a stress relieving bracelet to help her sleep she was also taking melatonin at some point but has stopped this is as well  In addition to her medication she does give herself B12 shots every now and then but has not had any recent levels she states that she brought this medication from a wellness physician in [facility] had a colonoscopy and states everything was normal she no longer gets mammograms He's had a Pneumovax but has not had Prevnar 13 Is unable to afford shingles vaccine    Review Of Systems:  GEN- +fatigue, fever, weight loss,weakness, recent illness HEENT- denies eye drainage, change in vision, nasal  discharge, CVS- denies chest pain, palpitations RESP- denies SOB, cough, wheeze ABD- denies N/V, change in stools, abd pain GU- denies dysuria, hematuria, dribbling, incontinence MSK- + joint pain, muscle aches, injury Neuro- denies headache, dizziness, syncope, seizure activity       Objective:    BP 130/74  Pulse 66  Temp(Src) 97.9 F (36.6 C)  Resp 18  Ht 5\' 3"  (1.6 m)  Wt 154 lb (69.854 kg)  BMI 27.29 kg/m2 GEN- NAD, alert and oriented x3 HEENT- PERRL, EOMI, non injected sclera, pink conjunctiva, MMM, oropharynx clear Neck- Supple, no thyromegaly, no carotid bruit CVS- RRR, no murmur RESP-CTAB ABD-NABS,soft,NT,ND EXT- No edema Pulses- Radial, DP- 2+ MSK- degenerative changes of bialt hands at MIP, DIP Psych-normal affect and mood      Assessment & Plan:      Problem List Items Addressed This Visit   HYPERLIPIDEMIA     Needs statin therapy, check FLP, planb to start lipitor generic     Relevant Medications      niacin (NIASPAN) 500 MG CR tablet   Other Relevant Orders      Lipid panel   Encounter for monitoring coumadin therapy - Primary   Relevant Orders      PT with INR/Fingerstick (Completed)   CAD (coronary artery disease)   Relevant Orders      CBC with Differential      Comprehensive metabolic panel  Lipid panel    Other Visit Diagnoses   B12 deficiency        Relevant Orders       Vitamin B12    Other malaise and fatigue        Relevant Orders       TSH    Need for prophylactic vaccination against Streptococcus pneumoniae (pneumococcus)        Relevant Orders       Pneumococcal conjugate vaccine 13-valent (Completed)       Note: This dictation was prepared with Dragon dictation along with smaller phrase technology. Any transcriptional errors that result from this process are unintentional.

## 2013-04-06 NOTE — Telephone Encounter (Signed)
This encounter was created in error - please disregard.

## 2013-04-06 NOTE — Assessment & Plan Note (Signed)
I think this is probably multifactorial with her age but she is politely adamant that it is her medications. I will have her cut the losartan inhalf she thinks that this is the culprit and see how she does

## 2013-04-11 ENCOUNTER — Other Ambulatory Visit (HOSPITAL_COMMUNITY): Payer: Self-pay | Admitting: Urology

## 2013-04-11 DIAGNOSIS — N200 Calculus of kidney: Secondary | ICD-10-CM

## 2013-04-11 DIAGNOSIS — N201 Calculus of ureter: Secondary | ICD-10-CM

## 2013-04-13 ENCOUNTER — Encounter: Payer: Medicare Other | Admitting: *Deleted

## 2013-04-18 ENCOUNTER — Encounter: Payer: Self-pay | Admitting: *Deleted

## 2013-04-20 LAB — LIPID PANEL
CHOL/HDL RATIO: 4.5 ratio
CHOLESTEROL: 161 mg/dL (ref 0–200)
HDL: 36 mg/dL — ABNORMAL LOW (ref >39)
LDL Cholesterol: 96 mg/dL (ref 0–99)
Triglycerides: 143 mg/dL (ref <150)
VLDL: 29 mg/dL (ref 0–40)

## 2013-04-20 LAB — CBC WITH DIFFERENTIAL/PLATELET
Basophils Absolute: 0.1 10*3/uL (ref 0.0–0.1)
Basophils Relative: 1 % (ref 0–1)
EOS PCT: 2 % (ref 0–5)
Eosinophils Absolute: 0.2 10*3/uL (ref 0.0–0.7)
HEMATOCRIT: 46.1 % — AB (ref 36.0–46.0)
Hemoglobin: 15.9 g/dL — ABNORMAL HIGH (ref 12.0–15.0)
LYMPHS ABS: 3.1 10*3/uL (ref 0.7–4.0)
LYMPHS PCT: 32 % (ref 12–46)
MCH: 32.3 pg (ref 26.0–34.0)
MCHC: 34.5 g/dL (ref 30.0–36.0)
MCV: 93.7 fL (ref 78.0–100.0)
MONO ABS: 0.9 10*3/uL (ref 0.1–1.0)
Monocytes Relative: 9 % (ref 3–12)
Neutro Abs: 5.4 10*3/uL (ref 1.7–7.7)
Neutrophils Relative %: 56 % (ref 43–77)
PLATELETS: 263 10*3/uL (ref 150–400)
RBC: 4.92 MIL/uL (ref 3.87–5.11)
RDW: 14.5 % (ref 11.5–15.5)
WBC: 9.6 10*3/uL (ref 4.0–10.5)

## 2013-04-20 LAB — COMPREHENSIVE METABOLIC PANEL
ALBUMIN: 4.3 g/dL (ref 3.5–5.2)
ALT: 29 U/L (ref 0–35)
AST: 28 U/L (ref 0–37)
Alkaline Phosphatase: 51 U/L (ref 39–117)
BUN: 11 mg/dL (ref 6–23)
CALCIUM: 9.9 mg/dL (ref 8.4–10.5)
CHLORIDE: 105 meq/L (ref 96–112)
CO2: 30 meq/L (ref 19–32)
CREATININE: 0.89 mg/dL (ref 0.50–1.10)
Glucose, Bld: 112 mg/dL — ABNORMAL HIGH (ref 70–99)
Potassium: 4.4 mEq/L (ref 3.5–5.3)
Sodium: 145 mEq/L (ref 135–145)
Total Bilirubin: 0.8 mg/dL (ref 0.2–1.2)
Total Protein: 6.9 g/dL (ref 6.0–8.3)

## 2013-04-20 LAB — VITAMIN B12: Vitamin B-12: >2000 pg/mL — ABNORMAL HIGH (ref 211–911)

## 2013-04-20 LAB — TSH: TSH: 1.89 u[IU]/mL (ref 0.350–4.500)

## 2013-04-21 ENCOUNTER — Ambulatory Visit (INDEPENDENT_AMBULATORY_CARE_PROVIDER_SITE_OTHER): Payer: Medicare Other

## 2013-04-21 ENCOUNTER — Encounter: Payer: Self-pay | Admitting: Internal Medicine

## 2013-04-21 DIAGNOSIS — I4891 Unspecified atrial fibrillation: Secondary | ICD-10-CM

## 2013-04-22 ENCOUNTER — Telehealth: Payer: Self-pay | Admitting: Internal Medicine

## 2013-04-22 NOTE — Telephone Encounter (Signed)
New message     Want you to know that she sent her transmission remotely yesterday

## 2013-04-22 NOTE — Telephone Encounter (Signed)
Left message informing patient that remote was received.

## 2013-05-02 LAB — MDC_IDC_ENUM_SESS_TYPE_REMOTE
Battery Remaining Longevity: 76 mo
Brady Statistic AP VS Percent: 1 %
Brady Statistic AS VS Percent: 96 %
Lead Channel Pacing Threshold Amplitude: 0.75 V
Lead Channel Sensing Intrinsic Amplitude: 0.7 mV
Lead Channel Sensing Intrinsic Amplitude: 8 mV
Lead Channel Setting Pacing Amplitude: 2.75 V
Lead Channel Setting Pacing Pulse Width: 0.4 ms
Lead Channel Setting Sensing Sensitivity: 4 mV
MDC IDC MSMT BATTERY IMPEDANCE: 561 Ohm
MDC IDC MSMT BATTERY VOLTAGE: 2.77 V
MDC IDC MSMT LEADCHNL RA IMPEDANCE VALUE: 478 Ohm
MDC IDC MSMT LEADCHNL RA PACING THRESHOLD AMPLITUDE: 0.5 V
MDC IDC MSMT LEADCHNL RA PACING THRESHOLD PULSEWIDTH: 0.4 ms
MDC IDC MSMT LEADCHNL RV IMPEDANCE VALUE: 578 Ohm
MDC IDC MSMT LEADCHNL RV PACING THRESHOLD PULSEWIDTH: 0.4 ms
MDC IDC SESS DTM: 20150327131707
MDC IDC SET LEADCHNL RV PACING AMPLITUDE: 2.5 V
MDC IDC STAT BRADY AP VP PERCENT: 2 %
MDC IDC STAT BRADY AS VP PERCENT: 0 %

## 2013-05-05 ENCOUNTER — Other Ambulatory Visit: Payer: Self-pay | Admitting: Family Medicine

## 2013-05-05 ENCOUNTER — Encounter: Payer: Self-pay | Admitting: Physician Assistant

## 2013-05-05 ENCOUNTER — Ambulatory Visit (INDEPENDENT_AMBULATORY_CARE_PROVIDER_SITE_OTHER): Payer: Medicare Other | Admitting: Physician Assistant

## 2013-05-05 VITALS — BP 132/92 | HR 68 | Temp 97.8°F | Resp 18 | Wt 150.0 lb

## 2013-05-05 DIAGNOSIS — Z5181 Encounter for therapeutic drug level monitoring: Secondary | ICD-10-CM

## 2013-05-05 DIAGNOSIS — I4891 Unspecified atrial fibrillation: Secondary | ICD-10-CM

## 2013-05-05 DIAGNOSIS — Z7901 Long term (current) use of anticoagulants: Secondary | ICD-10-CM

## 2013-05-05 DIAGNOSIS — Z950 Presence of cardiac pacemaker: Secondary | ICD-10-CM

## 2013-05-05 LAB — PT WITH INR/FINGERSTICK
INR FINGERSTICK: 1.9 — AB (ref 0.80–1.20)
PT, fingerstick: 23 seconds — ABNORMAL HIGH (ref 10.4–12.5)

## 2013-05-05 MED ORDER — CLONAZEPAM 1 MG PO TABS: 1.0000 mg | ORAL_TABLET | Freq: Every day | ORAL | Status: DC

## 2013-05-05 NOTE — Progress Notes (Signed)
Patient ID: Traci [name] MRN: [id number], DOB: 21-Mar-1934, 79 y.o. Date of Encounter: 05/05/2013, 4:12 PM    Chief Complaint:  Chief Complaint  Patient presents with  . PT/INR     HPI: 79 y.o. year old white female  Here for PT/INR check.  She recently saw Dr. Buelah Manis as a new patient to our office on 04/06/13. I have Reviewed her note. She has a history of atrial fibrillation and Coumadin therapy for about 8 years. She reports to me that her tablets are 5 mg tablets. She takes a half of a tablet every day to equal 2.5 mg every day. She says that she has been on this dose for years and her INR has been therapeutic. She does get INR checked once a month. She reports that she has had no skips doses. No bleeding. Has had stable intake of foods that contain vitamin K.     Home Meds: See attached medication section for any medications that were entered at today's visit. The computer does not put those onto this list.The following list is a list of meds entered prior to today's visit.   Current Outpatient Prescriptions on File Prior to Visit  Medication Sig Dispense Refill  . amLODipine (NORVASC) 5 MG tablet Take 1 tablet (5 mg total) by mouth every morning.  30 tablet  10  . Ascorbic Acid (VITAMIN C) 1000 MG tablet Take 1,000 mg by mouth 2 (two) times daily.       . Calcium Carbonate-Vit D-Min (CALCIUM 1200 PO) Take 1 capsule by mouth 2 (two) times daily.       . clonazePAM (KLONOPIN) 1 MG tablet Take 1 mg by mouth at bedtime.      . digoxin (LANOXIN) 0.125 MG tablet Take 125 mcg by mouth every morning.      . fish oil-omega-3 fatty acids 1000 MG capsule Take 1 g by mouth every morning.       Marland Kitchen losartan (COZAAR) 50 MG tablet Take 50 mg by mouth every morning.      . nebivolol (BYSTOLIC) 10 MG tablet Take 10 mg by mouth every morning.      . niacin (NIASPAN) 500 MG CR tablet Take 500 mg by mouth at bedtime.      Marland Kitchen warfarin (COUMADIN) 5 MG tablet Take 2.5 mg by mouth every morning.         No current facility-administered medications on file prior to visit.    Allergies:  Allergies  Allergen Reactions  . Morphine Nausea Only  . Penicillins       Review of Systems: See HPI for pertinent ROS. All other ROS negative.    Physical Exam: Blood pressure 132/92, pulse 68, temperature 97.8 F (36.6 C), temperature source Oral, resp. rate 18, weight 150 lb (68.04 kg)., Body mass index is 26.58 kg/(m^2). General: WNWD WF.  Appears in no acute distress. Neck: Supple. No thyromegaly. No lymphadenopathy. Lungs: Clear bilaterally to auscultation without wheezes, rales, or rhonchi. Breathing is unlabored. Heart: Irregular rhythm.  Msk:  Strength and tone normal for age. Extremities/Skin: Warm and dry.  Neuro: Alert and oriented X 3. Moves all extremities spontaneously. Gait is normal. CNII-XII grossly in tact. Psych:  Responds to questions appropriately with a normal affect.     ASSESSMENT AND PLAN:  79 y.o. year old female with  1. Encounter for monitoring coumadin therapy - PT with INR/Fingerstick  2. Atrial fibrillation  3. Cardiac pacemaker in situ   Goal INR 2.0 -  3.0 INR today is 1.9 Continue current dose of 2.5 mg ( 1/2 of 5mg ) QD Recheck INR 4 weeks.   [address] [place], [place], [facility] 05/05/2013 4:12 PM

## 2013-05-05 NOTE — Telephone Encounter (Signed)
Pt here for OV, on the way out she asked for refill of Clonazepam.  Per provider approval #[id number] + NO refills called to here pharmacy

## 2013-05-06 ENCOUNTER — Other Ambulatory Visit: Payer: Self-pay | Admitting: Family Medicine

## 2013-05-06 ENCOUNTER — Ambulatory Visit (HOSPITAL_COMMUNITY): Payer: Medicare Other

## 2013-05-06 DIAGNOSIS — I4891 Unspecified atrial fibrillation: Secondary | ICD-10-CM

## 2013-05-06 DIAGNOSIS — Z5181 Encounter for therapeutic drug level monitoring: Secondary | ICD-10-CM

## 2013-05-06 DIAGNOSIS — Z7901 Long term (current) use of anticoagulants: Secondary | ICD-10-CM

## 2013-05-10 ENCOUNTER — Ambulatory Visit (HOSPITAL_COMMUNITY)
Admission: RE | Admit: 2013-05-10 | Discharge: 2013-05-10 | Disposition: A | Discharge status: Home / Self Care | Payer: Medicare Other | Source: Ambulatory Visit | Attending: Urology | Admitting: Urology

## 2013-05-10 DIAGNOSIS — R358 Other polyuria: Secondary | ICD-10-CM | POA: Insufficient documentation

## 2013-05-10 DIAGNOSIS — N201 Calculus of ureter: Secondary | ICD-10-CM

## 2013-05-10 DIAGNOSIS — R3589 Other polyuria: Secondary | ICD-10-CM | POA: Insufficient documentation

## 2013-05-10 DIAGNOSIS — N200 Calculus of kidney: Secondary | ICD-10-CM | POA: Insufficient documentation

## 2013-05-10 MED ORDER — TECHNETIUM TC 99M MERTIATIDE
16.0000 | Freq: Once | INTRAVENOUS | Status: AC | PRN
  Administered 2013-05-10: 16 via INTRAVENOUS

## 2013-05-10 MED ORDER — FUROSEMIDE 10 MG/ML IJ SOLN
34.0000 mg | Freq: Once | INTRAMUSCULAR | Status: AC
  Administered 2013-05-10: 34 mg via INTRAVENOUS
  Filled 2013-05-10: qty 4

## 2013-05-24 ENCOUNTER — Encounter: Payer: Self-pay | Admitting: *Deleted

## 2013-06-02 ENCOUNTER — Other Ambulatory Visit: Payer: Self-pay | Admitting: Family Medicine

## 2013-06-02 LAB — PROTIME-INR
INR: 1.95 — ABNORMAL HIGH (ref <1.50)
Prothrombin Time: 21.8 seconds — ABNORMAL HIGH (ref 11.6–15.2)

## 2013-06-03 ENCOUNTER — Other Ambulatory Visit: Payer: Self-pay | Admitting: *Deleted

## 2013-06-03 MED ORDER — WARFARIN SODIUM 5 MG PO TABS: ORAL_TABLET | ORAL | Status: DC

## 2013-06-06 ENCOUNTER — Telehealth: Payer: Self-pay | Admitting: *Deleted

## 2013-06-06 ENCOUNTER — Other Ambulatory Visit: Payer: Self-pay | Admitting: *Deleted

## 2013-06-06 MED ORDER — AMLODIPINE BESYLATE 5 MG PO TABS: 5.0000 mg | ORAL_TABLET | Freq: Every morning | ORAL | Status: DC

## 2013-06-06 MED ORDER — WARFARIN SODIUM 5 MG PO TABS: ORAL_TABLET | ORAL | Status: DC

## 2013-06-06 MED ORDER — DIGOXIN 125 MCG PO TABS: 125.0000 ug | ORAL_TABLET | Freq: Every morning | ORAL | Status: DC

## 2013-06-06 NOTE — Telephone Encounter (Signed)
Message copied by Sheral Flow on Mon Jun 06, 2013  9:58 AM ------      Message from: Devoria Glassing      Created: Mon Jun 06, 2013  9:26 AM       PATIENT IS CALLING TO ASK IF BY CHANCE THE BYSTOLIC IS MAKING HER GO TO THE BATHROOM SO MUCH AT NIGHT SHE WANTED SAMPLES BUT NOT IF THAT IS WHAT IS MAKING HER GO TO THE BATHROOM SO MUCH                   [phone]  ------

## 2013-06-06 NOTE — Telephone Encounter (Signed)
LMTRC

## 2013-06-06 NOTE — Telephone Encounter (Signed)
No the bystolic should not cause this If we have samples she can have them Assess for UTI symptoms- schedule appt if needed, if none then reassure, she can try decreasing fluids before bedtime as well

## 2013-06-06 NOTE — Telephone Encounter (Signed)
Refill appropriate and filled per protocol. 

## 2013-06-07 NOTE — Telephone Encounter (Signed)
Call placed to patient and patient made aware.   No S/Sx of UTI.   Advised to increase fluids during daytime hours, but to decrease amount of fluids after 5pm.   States that she will try this for a few days.

## 2013-06-07 NOTE — Telephone Encounter (Signed)
LMTRC

## 2013-06-10 ENCOUNTER — Telehealth: Payer: Self-pay | Admitting: Family Medicine

## 2013-06-10 NOTE — Telephone Encounter (Signed)
Message copied by Olena Mater on Fri Jun 10, 2013  9:08 AM ------      Message from: Devoria Glassing      Created: Fri Jun 10, 2013  8:02 AM       Patient is calling to let us know that we sent her med refills to the wrong pharmacy she left message and did not give medication name       [phone] ------

## 2013-06-10 NOTE — Telephone Encounter (Signed)
I called patient about refill confusion.  She said pharmacy has already called her and everything already straightened out.

## 2013-06-17 ENCOUNTER — Other Ambulatory Visit: Payer: Self-pay | Admitting: Family Medicine

## 2013-06-17 NOTE — Telephone Encounter (Signed)
Refill Klonopin, give 2 refills

## 2013-06-17 NOTE — Telephone Encounter (Signed)
Medication called to pharmacy. 

## 2013-06-17 NOTE — Telephone Encounter (Signed)
Ok to refill??  Last office visit/ refill 05/05/2013.

## 2013-07-15 ENCOUNTER — Other Ambulatory Visit: Payer: Self-pay | Admitting: *Deleted

## 2013-07-15 DIAGNOSIS — I4891 Unspecified atrial fibrillation: Secondary | ICD-10-CM

## 2013-07-15 DIAGNOSIS — Z7901 Long term (current) use of anticoagulants: Secondary | ICD-10-CM

## 2013-07-15 DIAGNOSIS — Z5181 Encounter for therapeutic drug level monitoring: Secondary | ICD-10-CM

## 2013-07-18 LAB — PROTIME-INR
INR: 2.27 — AB (ref <1.50)
PROTHROMBIN TIME: 24.5 s — AB (ref 11.6–15.2)

## 2013-07-26 ENCOUNTER — Ambulatory Visit (INDEPENDENT_AMBULATORY_CARE_PROVIDER_SITE_OTHER): Payer: Medicare Other | Admitting: *Deleted

## 2013-07-26 ENCOUNTER — Telehealth: Payer: Self-pay | Admitting: Cardiology

## 2013-07-26 DIAGNOSIS — I4891 Unspecified atrial fibrillation: Secondary | ICD-10-CM

## 2013-07-26 NOTE — Telephone Encounter (Signed)
LMOVM reminding pt to send remote transmission.   

## 2013-07-27 ENCOUNTER — Telehealth: Payer: Self-pay | Admitting: *Deleted

## 2013-07-27 NOTE — Telephone Encounter (Signed)
Message copied by Sheral Flow on Wed Jul 27, 2013 11:39 AM ------      Message from: Devoria Glassing      Created: Wed Jul 27, 2013 10:44 AM       [phone]       Patient is calling to talk to you about one of her medications she said it was purple and thought it was for blood pressure  ------

## 2013-07-27 NOTE — Progress Notes (Signed)
Remote pacemaker transmission.   

## 2013-07-27 NOTE — Telephone Encounter (Signed)
Received call from patient.   Requested to be given samples of Bystolic.   Samples left at front desk.

## 2013-07-28 LAB — MDC_IDC_ENUM_SESS_TYPE_REMOTE
Battery Impedance: 611 Ohm
Battery Remaining Longevity: 72 mo
Battery Voltage: 2.78 V
Brady Statistic AP VS Percent: 1 %
Brady Statistic AS VP Percent: 0 %
Brady Statistic AS VS Percent: 96 %
Date Time Interrogation Session: 20150630205505
Lead Channel Impedance Value: 478 Ohm
Lead Channel Impedance Value: 550 Ohm
Lead Channel Pacing Threshold Amplitude: 0.5 V
Lead Channel Pacing Threshold Pulse Width: 0.4 ms
Lead Channel Pacing Threshold Pulse Width: 0.4 ms
Lead Channel Setting Pacing Amplitude: 2.5 V
Lead Channel Setting Pacing Amplitude: 2.75 V
Lead Channel Setting Pacing Pulse Width: 0.4 ms
Lead Channel Setting Sensing Sensitivity: 4 mV
MDC IDC MSMT LEADCHNL RA SENSING INTR AMPL: 0.7 mV
MDC IDC MSMT LEADCHNL RV PACING THRESHOLD AMPLITUDE: 0.875 V
MDC IDC MSMT LEADCHNL RV SENSING INTR AMPL: 8 mV
MDC IDC STAT BRADY AP VP PERCENT: 2 %

## 2013-08-03 ENCOUNTER — Encounter: Payer: Self-pay | Admitting: Cardiology

## 2013-08-08 ENCOUNTER — Encounter: Payer: Self-pay | Admitting: Internal Medicine

## 2013-08-16 ENCOUNTER — Other Ambulatory Visit: Payer: Self-pay | Admitting: Family Medicine

## 2013-08-16 DIAGNOSIS — Z7901 Long term (current) use of anticoagulants: Secondary | ICD-10-CM

## 2013-08-17 LAB — PROTIME-INR
INR: 1.89 — ABNORMAL HIGH (ref <1.50)
PROTHROMBIN TIME: 21.7 s — AB (ref 11.6–15.2)

## 2013-08-31 ENCOUNTER — Other Ambulatory Visit: Payer: Self-pay | Admitting: Family Medicine

## 2013-08-31 ENCOUNTER — Encounter: Payer: Self-pay | Admitting: Family Medicine

## 2013-08-31 ENCOUNTER — Ambulatory Visit (INDEPENDENT_AMBULATORY_CARE_PROVIDER_SITE_OTHER): Payer: Medicare Other | Admitting: Family Medicine

## 2013-08-31 VITALS — BP 138/82 | HR 78 | Temp 97.7°F | Resp 12 | Ht 65.0 in | Wt 152.0 lb

## 2013-08-31 DIAGNOSIS — Z5181 Encounter for therapeutic drug level monitoring: Secondary | ICD-10-CM

## 2013-08-31 DIAGNOSIS — G609 Hereditary and idiopathic neuropathy, unspecified: Secondary | ICD-10-CM

## 2013-08-31 DIAGNOSIS — M7989 Other specified soft tissue disorders: Secondary | ICD-10-CM | POA: Insufficient documentation

## 2013-08-31 DIAGNOSIS — I10 Essential (primary) hypertension: Secondary | ICD-10-CM

## 2013-08-31 DIAGNOSIS — E538 Deficiency of other specified B group vitamins: Secondary | ICD-10-CM

## 2013-08-31 DIAGNOSIS — I482 Chronic atrial fibrillation, unspecified: Secondary | ICD-10-CM

## 2013-08-31 DIAGNOSIS — G629 Polyneuropathy, unspecified: Secondary | ICD-10-CM

## 2013-08-31 DIAGNOSIS — Z7901 Long term (current) use of anticoagulants: Secondary | ICD-10-CM

## 2013-08-31 DIAGNOSIS — I4891 Unspecified atrial fibrillation: Secondary | ICD-10-CM

## 2013-08-31 LAB — COMPREHENSIVE METABOLIC PANEL
ALBUMIN: 3.9 g/dL (ref 3.5–5.2)
ALT: 16 U/L (ref 0–35)
AST: 19 U/L (ref 0–37)
Alkaline Phosphatase: 50 U/L (ref 39–117)
BILIRUBIN TOTAL: 0.7 mg/dL (ref 0.2–1.2)
BUN: 17 mg/dL (ref 6–23)
CO2: 26 mEq/L (ref 19–32)
Calcium: 9.9 mg/dL (ref 8.4–10.5)
Chloride: 103 mEq/L (ref 96–112)
Creat: 0.93 mg/dL (ref 0.50–1.10)
GLUCOSE: 225 mg/dL — AB (ref 70–99)
POTASSIUM: 3.8 meq/L (ref 3.5–5.3)
SODIUM: 142 meq/L (ref 135–145)
TOTAL PROTEIN: 6.6 g/dL (ref 6.0–8.3)

## 2013-08-31 LAB — PT WITH INR/FINGERSTICK
INR FINGERSTICK: 2.2 — AB (ref 0.80–1.20)
PT FINGERSTICK: 26 s — AB (ref 10.4–12.5)

## 2013-08-31 LAB — VITAMIN B12: VITAMIN B 12: 1136 pg/mL — AB (ref 211–911)

## 2013-08-31 NOTE — Assessment & Plan Note (Signed)
Due to leg swelling we'll discontinue the amlodipine for now she will continue her losartan if need be we can increase his back to a full tablet she will monitor her blood pressures for me at home

## 2013-08-31 NOTE — Assessment & Plan Note (Signed)
Rate control on anticoagulation

## 2013-08-31 NOTE — Assessment & Plan Note (Signed)
D/c norvasc and see how she does

## 2013-08-31 NOTE — Assessment & Plan Note (Signed)
Coumadin at goal continue current dose

## 2013-08-31 NOTE — Progress Notes (Signed)
Patient ID: Traci [name], female   DOB: 11-Jul-1934, 79 y.o.   MRN: [id number]   Subjective:    Patient ID: Traci [name], female    DOB: 09-Nov-1934, 79 y.o.   MRN: [id number]  Patient presents for Discuss Medication Changes and Coagulation Disorder  Patient here to followup medications. She's concerned about her amlodipine she's been having swelling in her feet as well as a numbness in her toes and feels like they go cold at times. There've been no color changes. Discuss with her pharmacist and was told that it was the amlodipine which I do agree that this is causing the swelling in her feet. She would like to discontinue the medication. We also reviewed all of her herbal medications and her other blood pressure medications. She's not had any chest pain or shortness of breath her age her for ablation her Coumadin level was checked here today in the office and was 2.2.   Review Of Systems:  GEN- denies fatigue, fever, weight loss,weakness, recent illness HEENT- denies eye drainage, change in vision, nasal discharge, CVS- denies chest pain, palpitations RESP- denies SOB, cough, wheeze MSK- denies joint pain, muscle aches, injury Neuro- denies headache, dizziness, syncope, seizure activity       Objective:    BP 138/82  Pulse 78  Temp(Src) 97.7 F (36.5 C) (Oral)  Resp 12  Ht 5\' 5"  (1.651 m)  Wt 152 lb (68.947 kg)  BMI 25.29 kg/m2 GEN- NAD, alert and oriented x3 HEENT- PERRL, EOMI, non injected sclera, pink conjunctiva, MMM, oropharynx clear CVS- irregular rhtyem, rate, no murmur RESP-CTAB EXT- No edema Skin- normal color Neuro- decreased monofilament in great toes bilat and spots 4 and 5  Pulses- Radial, DP- 2+, PT 2+       Assessment & Plan:      Problem List Items Addressed This Visit   Peripheral neuropathy   Essential hypertension, benign   Relevant Medications      warfarin (COUMADIN) 5 MG tablet   Other Relevant Orders      Comprehensive metabolic panel   Encounter for monitoring coumadin therapy - Primary    Other Visit Diagnoses   B12 deficiency        Relevant Orders       Vitamin B12       Note: This dictation was prepared with Dragon dictation along with smaller phrase technology. Any transcriptional errors that result from this process are unintentional.

## 2013-08-31 NOTE — Patient Instructions (Addendum)
Stop the norvasc Monitor your blood pressure at home- write it down  Coumadin looks good  We will call in 2 weeks for your blood pressure readings New patient papers for your husband  F/U 4 weeks for coumadin,   F/u 3 months for OV

## 2013-08-31 NOTE — Assessment & Plan Note (Signed)
Possible she has some underlying neuropathy, I will d/c norvasc first to see if this makes a difference She is not diabetic Blood flow looks good on exam

## 2013-09-02 LAB — HEMOGLOBIN A1C
Hgb A1c MFr Bld: 7.3 % — ABNORMAL HIGH (ref <5.7)
Mean Plasma Glucose: 163 mg/dL — ABNORMAL HIGH (ref <117)

## 2013-09-05 ENCOUNTER — Other Ambulatory Visit: Payer: Self-pay | Admitting: *Deleted

## 2013-09-05 MED ORDER — BLOOD GLUCOSE METER KIT: PACK | Status: DC

## 2013-09-08 ENCOUNTER — Ambulatory Visit: Payer: Self-pay | Admitting: Family Medicine

## 2013-10-13 ENCOUNTER — Encounter: Payer: Self-pay | Admitting: *Deleted

## 2013-10-13 ENCOUNTER — Encounter: Payer: Self-pay | Admitting: Internal Medicine

## 2013-10-13 ENCOUNTER — Ambulatory Visit (INDEPENDENT_AMBULATORY_CARE_PROVIDER_SITE_OTHER): Payer: Medicare Other | Admitting: Internal Medicine

## 2013-10-13 VITALS — BP 128/84 | HR 69 | Ht 64.0 in | Wt 153.0 lb

## 2013-10-13 DIAGNOSIS — R079 Chest pain, unspecified: Secondary | ICD-10-CM

## 2013-10-13 DIAGNOSIS — I4891 Unspecified atrial fibrillation: Secondary | ICD-10-CM

## 2013-10-13 DIAGNOSIS — Z950 Presence of cardiac pacemaker: Secondary | ICD-10-CM

## 2013-10-13 DIAGNOSIS — I10 Essential (primary) hypertension: Secondary | ICD-10-CM

## 2013-10-13 LAB — MDC_IDC_ENUM_SESS_TYPE_INCLINIC
Brady Statistic AP VP Percent: 3 %
Brady Statistic AP VS Percent: 1 %
Brady Statistic AS VP Percent: 0 %
Brady Statistic AS VS Percent: 96 %
Date Time Interrogation Session: 20150917135244
Lead Channel Impedance Value: 490 Ohm
Lead Channel Impedance Value: 573 Ohm
Lead Channel Pacing Threshold Amplitude: 0.75 V
Lead Channel Sensing Intrinsic Amplitude: 0.5 mV
Lead Channel Sensing Intrinsic Amplitude: 8 mV
Lead Channel Setting Pacing Amplitude: 2.5 V
MDC IDC MSMT BATTERY IMPEDANCE: 711 Ohm
MDC IDC MSMT BATTERY REMAINING LONGEVITY: 67 mo
MDC IDC MSMT BATTERY VOLTAGE: 2.77 V
MDC IDC MSMT LEADCHNL RV PACING THRESHOLD PULSEWIDTH: 0.4 ms
MDC IDC SET LEADCHNL RA PACING AMPLITUDE: 2.75 V
MDC IDC SET LEADCHNL RV PACING PULSEWIDTH: 0.4 ms
MDC IDC SET LEADCHNL RV SENSING SENSITIVITY: 4 mV

## 2013-10-13 NOTE — Assessment & Plan Note (Signed)
She has a h/o CAD but does not have angina. I am concerned that her sob and fatigue could be her anginal equivalent and will have her undergo a 2D echo and lexiscan myoview.

## 2013-10-13 NOTE — Assessment & Plan Note (Signed)
It is unclear if her atrial fib is contributing. I will consider DCCV if other tests are negative.

## 2013-10-13 NOTE — Patient Instructions (Addendum)
Your physician has requested that you have an echocardiogram. Echocardiography is a painless test that uses sound waves to create images of your heart. It provides your doctor with information about the size and shape of your heart and how well your heart's chambers and valves are working. This procedure takes approximately one hour. There are no restrictions for this procedure.  Your physician has requested that you have a lexiscan myoview. For further information please visit [email or web address]. Please follow instruction sheet, as given.  Remote monitoring is used to monitor your Pacemaker of ICD from home. This monitoring reduces the number of office visits required to check your device to one time per year. It allows [place] to keep an eye on the functioning of your device to ensure it is working properly. You are scheduled for a device check from home on December 12th. You may send your transmission at any time that day. If you have a wireless device, the transmission will be sent automatically. After your physician reviews your transmission, you will receive a postcard with your next transmission date.  Thank you for choosing [facility]!!

## 2013-10-13 NOTE — Progress Notes (Signed)
HPI Traci [name] returns today for followup. She is a 79 year old woman with chronic atrial fibrillation, hypertension, and symptomatic tachycardia bradycardia syndrome, status post permanent pacemaker insertion. In the interim, she has had problems with fatigue and weakness. "I just dont have any energy. Denies syncope. No edema. No chest pressure.  Allergies  Allergen Reactions  . Morphine Nausea Only  . Penicillins      Current Outpatient Prescriptions  Medication Sig Dispense Refill  . Ascorbic Acid (VITAMIN C) 1000 MG tablet Take 1,000 mg by mouth daily.       Marland Kitchen BEE POLLEN PO Take by mouth.      . Blood Glucose Monitoring Suppl (BLOOD GLUCOSE METER KIT AND SUPPLIES) Dispense based on patient and insurance preference. Use to monitor FSBS one time daily as directed. DX: 250.00  1 each  0  . Calcium Carbonate-Vit D-Min (CALCIUM 1200 PO) Take 1 capsule by mouth 2 (two) times daily.       . Cholecalciferol (CVS D3) 5000 UNITS capsule Take 5,000 Units by mouth daily.      . clonazePAM (KLONOPIN) 1 MG tablet TAKE 1 TABLET BY MOUTH AT BEDTIME AS NEEDED  30 tablet  2  . digoxin (LANOXIN) 0.125 MG tablet Take 1 tablet (125 mcg total) by mouth every morning.  90 tablet  3  . glucosamine-chondroitin 500-400 MG tablet Take 1 tablet by mouth 3 (three) times daily.      Marland Kitchen LECITHIN PO Take by mouth.      . losartan (COZAAR) 50 MG tablet Take 50 mg by mouth every morning.      . nebivolol (BYSTOLIC) 10 MG tablet Take 10 mg by mouth every morning.      . niacin (NIASPAN) 500 MG CR tablet Take 500 mg by mouth at bedtime.      . pyridoxine (B-6) 100 MG tablet Take 100 mg by mouth as needed.       . vitamin B-12 (CYANOCOBALAMIN) 250 MCG tablet Take 250 mcg by mouth daily.      Marland Kitchen warfarin (COUMADIN) 5 MG tablet Take 2.22m (1/2 tablet) on M,T,,Th,F,Sat. Take 550m(1 tablet) on Sun.Wed       No current facility-administered medications for this visit.     Past Medical History  Diagnosis Date  . Sick  sinus syndrome     PAF in 8,9,10/2003,1/06;insignificant CAD in  08/2003  . Sick sinus syndrome     ddd pacing medtronic rhythm 05/2004   . Chronic anticoagulation     followed by PMD  . Hyperlipidemia   . Hypertension   . Cerebrovascular disease     right carotid endarectomy in 1999;negative duplex in 06/2004  . Pneumonia     bilateral pneumonia 2010  . Hyperglycemia     fasting  . Coronary artery disease   . Arthritis   . Pacemaker   . History of kidney stones     ROS:   All systems reviewed and negative except as noted in the HPI.   Past Surgical History  Procedure Laterality Date  . Appendectomy    . Abdominal hysterectomy    . Right carotid endarectomy      1999  . Pacemaker placement      2006  . Colonoscopy      2006  . Cystoscopy w/ ureteral stent placement Left 04/19/2012    Procedure: CYSTOSCOPY WITH RETROGRADE PYELOGRAM/URETERAL STENT PLACEMENT ;  Surgeon: SiAilene RudMD;  Location: [facility];  Service: Urology;  Laterality: Left;  .  Breast biopsy Bilateral     x 7 total  . Eye surgery Bilateral     cataract extraction with IOL  . Cystoscopy with retrograde pyelogram, ureteroscopy and stent placement Left 06/30/2012    Procedure: CYSTOSCOPY WITH LEFT  RETROGRADE PYELOGRAM, URETEROSCOPY  with basketing of stone, AND STENT PLACEMENT, TRANSURETHRAL UNROOFING OF URETER.;  Surgeon: Alexis Frock, MD;  Location: [facility];  Service: Urology;  Laterality: Left;     Family History  Problem Relation Age of Onset  . Heart attack Mother     Died age 3  . Hyperlipidemia Mother   . Diabetes Mother   . Coronary artery disease Father   . Alcohol abuse Father   . Hyperlipidemia Father   . Diabetes Sister   . Hypertension Sister   . Cancer Sister     Breast  . Cancer Sister     Breast     History   Social History  . Marital Status: Married    Spouse Name: N/A    Number of Children: N/A  . Years of Education: N/A   Occupational History  . Not on file.    Social History Main Topics  . Smoking status: Never Smoker   . Smokeless tobacco: Never Used  . Alcohol Use: No  . Drug Use: No  . Sexual Activity: No   Other Topics Concern  . Not on file   Social History Narrative  . No narrative on file     BP 128/84  Pulse 69  Ht _0  (1.626 m)  Wt 153 lb (69.4 kg)  BMI 26.25 kg/m2  SpO2 97%  Physical Exam:  stable appearing 79 year old woman, NAD HEENT: Unremarkable Neck:  No JVD, no thyromegally Back:  No CVA tenderness Lungs:  Clear with no wheezes, rales, or rhonchi. HEART:  Regular rate rhythm, no murmurs, no rubs, no clicks Abd:  soft, positive bowel sounds, no organomegally, no rebound, no guarding Ext:  2 plus pulses, no edema, no cyanosis, no clubbing Skin:  No rashes no nodules Neuro:  CN II through XII intact, motor grossly intact  DEVICE  Normal device function.  See PaceArt for details.   Assess/Plan:

## 2013-10-13 NOTE — Assessment & Plan Note (Signed)
Her medtronic DDD PM is working normally. She is out of rhythm about 50% of the time. She is pacing only 5% in the ventricle.

## 2013-10-14 ENCOUNTER — Encounter: Payer: Self-pay | Admitting: Internal Medicine

## 2013-10-14 LAB — PACEMAKER DEVICE OBSERVATION

## 2013-10-24 ENCOUNTER — Encounter (HOSPITAL_COMMUNITY)
Admission: RE | Admit: 2013-10-24 | Discharge: 2013-10-24 | Disposition: A | Discharge status: Home / Self Care | Payer: Medicare Other | Source: Ambulatory Visit | Attending: Internal Medicine | Admitting: Internal Medicine

## 2013-10-24 ENCOUNTER — Encounter (HOSPITAL_COMMUNITY): Payer: Self-pay

## 2013-10-24 ENCOUNTER — Ambulatory Visit (HOSPITAL_COMMUNITY)
Admission: RE | Admit: 2013-10-24 | Discharge: 2013-10-24 | Disposition: A | Discharge status: Home / Self Care | Payer: Medicare Other | Source: Ambulatory Visit | Attending: Internal Medicine | Admitting: Internal Medicine

## 2013-10-24 DIAGNOSIS — R079 Chest pain, unspecified: Secondary | ICD-10-CM | POA: Insufficient documentation

## 2013-10-24 DIAGNOSIS — R5383 Other fatigue: Secondary | ICD-10-CM

## 2013-10-24 DIAGNOSIS — I495 Sick sinus syndrome: Secondary | ICD-10-CM | POA: Insufficient documentation

## 2013-10-24 DIAGNOSIS — I059 Rheumatic mitral valve disease, unspecified: Secondary | ICD-10-CM | POA: Diagnosis not present

## 2013-10-24 DIAGNOSIS — E785 Hyperlipidemia, unspecified: Secondary | ICD-10-CM | POA: Insufficient documentation

## 2013-10-24 DIAGNOSIS — I10 Essential (primary) hypertension: Secondary | ICD-10-CM | POA: Insufficient documentation

## 2013-10-24 DIAGNOSIS — R072 Precordial pain: Secondary | ICD-10-CM

## 2013-10-24 DIAGNOSIS — Z950 Presence of cardiac pacemaker: Secondary | ICD-10-CM | POA: Diagnosis not present

## 2013-10-24 DIAGNOSIS — R5381 Other malaise: Secondary | ICD-10-CM | POA: Diagnosis not present

## 2013-10-24 DIAGNOSIS — I079 Rheumatic tricuspid valve disease, unspecified: Secondary | ICD-10-CM | POA: Insufficient documentation

## 2013-10-24 DIAGNOSIS — I2510 Atherosclerotic heart disease of native coronary artery without angina pectoris: Secondary | ICD-10-CM | POA: Insufficient documentation

## 2013-10-24 DIAGNOSIS — I4891 Unspecified atrial fibrillation: Secondary | ICD-10-CM | POA: Insufficient documentation

## 2013-10-24 DIAGNOSIS — I482 Chronic atrial fibrillation: Secondary | ICD-10-CM | POA: Insufficient documentation

## 2013-10-24 DIAGNOSIS — R0602 Shortness of breath: Secondary | ICD-10-CM | POA: Diagnosis not present

## 2013-10-24 MED ORDER — SODIUM CHLORIDE 0.9 % IJ SOLN
INTRAMUSCULAR | Status: DC
Start: 2013-10-24 — End: 2013-10-25
  Filled 2013-10-24: qty 10

## 2013-10-24 MED ORDER — TECHNETIUM TC 99M SESTAMIBI - CARDIOLITE
30.0000 | Freq: Once | INTRAVENOUS | Status: AC | PRN
  Administered 2013-10-24: 30 via INTRAVENOUS

## 2013-10-24 MED ORDER — REGADENOSON 0.4 MG/5ML IV SOLN
INTRAVENOUS | Status: AC
  Filled 2013-10-24: qty 5

## 2013-10-24 MED ORDER — SODIUM CHLORIDE 0.9 % IJ SOLN
10.0000 mL | INTRAMUSCULAR | Status: DC | PRN
  Administered 2013-10-24: 10 mL via INTRAVENOUS

## 2013-10-24 MED ORDER — REGADENOSON 0.4 MG/5ML IV SOLN
0.4000 mg | Freq: Once | INTRAVENOUS | Status: AC | PRN
  Administered 2013-10-24: 0.4 mg via INTRAVENOUS

## 2013-10-24 MED ORDER — TECHNETIUM TC 99M SESTAMIBI GENERIC - CARDIOLITE
10.0000 | Freq: Once | INTRAVENOUS | Status: AC | PRN
  Administered 2013-10-24: 10 via INTRAVENOUS

## 2013-10-24 NOTE — Progress Notes (Signed)
  Echocardiogram 2D Echocardiogram has been performed.  [place], [place] 10/24/2013, 9:08 AM

## 2013-10-24 NOTE — Progress Notes (Signed)
Stress Lab Nurses Notes - [place] [facility] 10/24/2013 Reason for doing test: Fatigue/AFib Type of test: Wille Glaser Nurse performing test: Gerrit Halls, RN Nuclear Medicine Tech: Melburn Hake Echo Tech: Not Applicable MD performing test: S. McDowell/K.Lawrence NP Family JS:[id number]   Test explained and consent signed: Yes.   IV started: Saline lock flushed, No redness or edema and Saline lock started in radiology Symptoms: tightness in stomach Treatment/Intervention: None Reason test stopped: protocol completed After recovery IV was: Discontinued via X-ray tech and No redness or edema Patient to return to Nuc. Med at : 11:45 Patient discharged: Home Patient's Condition upon discharge was: stable Comments: During test BP 118/74 & HR 98.  Recovery BP 144/99 & HR 71.  Symptoms resolved in recovery.  Geanie Cooley T

## 2013-10-31 ENCOUNTER — Telehealth: Payer: Self-pay | Admitting: *Deleted

## 2013-10-31 NOTE — Telephone Encounter (Signed)
Pt notified forwarded to Dr. Cindie Laroche. Pt says her husband passed away this weekend and  has been off balance from time to time since the stress test and wanted Dr. Lovena Le to know and if this was normal. Will forward to Dr. Lovena Le

## 2013-11-01 ENCOUNTER — Telehealth: Payer: Self-pay | Admitting: *Deleted

## 2013-11-01 DIAGNOSIS — R410 Disorientation, unspecified: Secondary | ICD-10-CM

## 2013-11-01 NOTE — Telephone Encounter (Signed)
Pt husband passed away on Sunday. Her Bp has been up and she has not been feeling good. Like her body is rushing. BP 154/105, 161/98, 176/100.

## 2013-11-01 NOTE — Telephone Encounter (Signed)
Pt is concerned about BP was up to 176/100 now is at 112/80 HR 80. Off balance some and has been very confused on occasion according to daughter. Suggested she should contact pcp for possible referral to nuero pt daughter said she would reach out to pcp. Pt husband passed away this passed weekend and daughter is concerned about pt confusion and bp elevating so high. Wanting to know if Dr. Lovena Le would suggest further testing or change in medication or referral to nuero. Will forward to Dr. Lovena Le advise.

## 2013-11-08 ENCOUNTER — Telehealth: Payer: Self-pay | Admitting: *Deleted

## 2013-11-08 MED ORDER — AMLODIPINE BESYLATE 5 MG PO TABS: 5.0000 mg | ORAL_TABLET | Freq: Every day | ORAL | Status: DC

## 2013-11-08 NOTE — Telephone Encounter (Signed)
Pt is taking 100 mg of losartan daily per Dr. Cindie Laroche 11/01/13 and has not helped. Pt neglected to mention this change on our last phone conversation. Please advise.

## 2013-11-08 NOTE — Telephone Encounter (Signed)
Per Dr. Bronson Ing sent amlodipine 5 mg daily to [facility]. Pt daughter made aware of medication and to keep BP monitored.

## 2013-11-10 NOTE — Telephone Encounter (Signed)
Refer to neuro. GT

## 2013-11-10 NOTE — Telephone Encounter (Signed)
No new recs. Dizziness should resolve. GT

## 2013-11-11 NOTE — Addendum Note (Signed)
Addended by: Barbarann Ehlers A on: 11/11/2013 08:44 AM   Modules accepted: Orders

## 2013-11-11 NOTE — Telephone Encounter (Signed)
Ref placed to neurology Dr.[place], pt made aware,will call daughter Helene Kelp at [phone]

## 2013-11-25 ENCOUNTER — Telehealth: Payer: Self-pay | Admitting: *Deleted

## 2013-11-25 NOTE — Telephone Encounter (Signed)
Dr Alessandra Grout office called to let us know patient canceled appt that we set up for her for confusion. Patient stated that she had to much going on to keep appt/tmj

## 2013-11-25 NOTE — Telephone Encounter (Signed)
Will forward to Dr Taylor for FYI 

## 2013-12-16 ENCOUNTER — Ambulatory Visit (INDEPENDENT_AMBULATORY_CARE_PROVIDER_SITE_OTHER): Payer: Medicare Other | Admitting: Internal Medicine

## 2013-12-16 ENCOUNTER — Encounter: Payer: Self-pay | Admitting: Internal Medicine

## 2013-12-16 VITALS — BP 138/82 | HR 80 | Ht 64.0 in | Wt 148.8 lb

## 2013-12-16 DIAGNOSIS — I482 Chronic atrial fibrillation, unspecified: Secondary | ICD-10-CM

## 2013-12-16 DIAGNOSIS — Z950 Presence of cardiac pacemaker: Secondary | ICD-10-CM | POA: Diagnosis not present

## 2013-12-16 DIAGNOSIS — I495 Sick sinus syndrome: Secondary | ICD-10-CM | POA: Diagnosis not present

## 2013-12-16 DIAGNOSIS — I10 Essential (primary) hypertension: Secondary | ICD-10-CM | POA: Diagnosis not present

## 2013-12-16 LAB — MDC_IDC_ENUM_SESS_TYPE_INCLINIC
Battery Impedance: 762 Ohm
Battery Remaining Longevity: 64 mo
Battery Voltage: 2.76 V
Brady Statistic AP VP Percent: 4 %
Brady Statistic AP VS Percent: 2 %
Brady Statistic AS VP Percent: 0 %
Lead Channel Impedance Value: 505 Ohm
Lead Channel Sensing Intrinsic Amplitude: 0.35 mV
Lead Channel Setting Pacing Amplitude: 2.5 V
Lead Channel Setting Pacing Amplitude: 2.75 V
Lead Channel Setting Pacing Pulse Width: 0.4 ms
Lead Channel Setting Sensing Sensitivity: 4 mV
MDC IDC MSMT LEADCHNL RV IMPEDANCE VALUE: 573 Ohm
MDC IDC MSMT LEADCHNL RV PACING THRESHOLD AMPLITUDE: 0.75 V
MDC IDC MSMT LEADCHNL RV PACING THRESHOLD PULSEWIDTH: 0.4 ms
MDC IDC MSMT LEADCHNL RV SENSING INTR AMPL: 8 mV
MDC IDC SESS DTM: 20151120114711
MDC IDC STAT BRADY AS VS PERCENT: 94 %

## 2013-12-16 NOTE — Patient Instructions (Signed)
Your physician wants you to follow-up in: 6 months with Dr. Knox Saliva will receive a reminder letter in the mail two months in advance. If you don't receive a letter, please call our office to schedule the follow-up appointment.  Your physician recommends that you continue on your current medications as directed. Please refer to the Current Medication list given to you today.  Your physician recommends that you return for lab work  TSH/SED   Thank you for choosing [facility]!!

## 2013-12-17 LAB — SEDIMENTATION RATE: SED RATE: 1 mm/h (ref 0–22)

## 2013-12-17 LAB — TSH: TSH: 2.115 u[IU]/mL (ref 0.350–4.500)

## 2013-12-18 NOTE — Assessment & Plan Note (Signed)
His medtronic DDD PM is working normally. Will recheck in several months. 

## 2013-12-18 NOTE — Assessment & Plan Note (Signed)
Her ventricular rate is well controlled. She will continue her current meds. 

## 2013-12-18 NOTE — Progress Notes (Signed)
HPI Traci [name] returns today for followup. She is a 79 year old woman with chronic atrial fibrillation, hypertension, and symptomatic tachycardia bradycardia syndrome, status post permanent pacemaker insertion. When I saw the patient a few weeks ago, she had complained of fatigue, weakness and sob. She not told me that her husband had terminal cancer and she was caring for him around the clock. He subsequently passed away and over the past few weeks she is much improved. She is back to baseline. No edema, chest pain or palpitations.  Allergies  Allergen Reactions  . Morphine Nausea Only  . Penicillins      Current Outpatient Prescriptions  Medication Sig Dispense Refill  . amLODipine (NORVASC) 5 MG tablet Take 1 tablet (5 mg total) by mouth daily. 30 tablet 3  . Ascorbic Acid (VITAMIN C) 1000 MG tablet Take 1,000 mg by mouth daily.     Marland Kitchen BEE POLLEN PO Take by mouth.    . Blood Glucose Monitoring Suppl (BLOOD GLUCOSE METER KIT AND SUPPLIES) Dispense based on patient and insurance preference. Use to monitor FSBS one time daily as directed. DX: 250.00 1 each 0  . Calcium Carbonate-Vit D-Min (CALCIUM 1200 PO) Take 1 capsule by mouth 2 (two) times daily.     . Cholecalciferol ([employer] D3) 5000 UNITS capsule Take 5,000 Units by mouth daily.    . clonazePAM (KLONOPIN) 1 MG tablet TAKE 1 TABLET BY MOUTH AT BEDTIME AS NEEDED 30 tablet 2  . digoxin (LANOXIN) 0.125 MG tablet Take 1 tablet (125 mcg total) by mouth every morning. 90 tablet 3  . fesoterodine (TOVIAZ) 4 MG TB24 tablet Take 4 mg by mouth daily.    Marland Kitchen glucosamine-chondroitin 500-400 MG tablet Take 1 tablet by mouth 3 (three) times daily.    Marland Kitchen LECITHIN PO Take by mouth.    . losartan (COZAAR) 100 MG tablet Take 50 mg by mouth 2 (two) times daily.    . nebivolol (BYSTOLIC) 10 MG tablet Take 10 mg by mouth every morning.    . niacin (NIASPAN) 500 MG CR tablet Take 500 mg by mouth at bedtime.    . pyridoxine (B-6) 100 MG tablet Take 100 mg by mouth  as needed.     . vitamin B-12 (CYANOCOBALAMIN) 250 MCG tablet Take 250 mcg by mouth daily.    Marland Kitchen warfarin (COUMADIN) 5 MG tablet Take 2.87m (1/2 tablet) on M,T,,Th,F,Sat. Take 54m(1 tablet) on Sun.Wed     No current facility-administered medications for this visit.     Past Medical History  Diagnosis Date  . Sick sinus syndrome     PAF in 8,9,10/2003,1/06;insignificant CAD in  08/2003  . Sick sinus syndrome     ddd pacing medtronic rhythm 05/2004   . Chronic anticoagulation     followed by PMD  . Hyperlipidemia   . Hypertension   . Cerebrovascular disease     right carotid endarectomy in 1999;negative duplex in 06/2004  . Pneumonia     bilateral pneumonia 2010  . Hyperglycemia     fasting  . Coronary artery disease   . Arthritis   . Pacemaker   . History of kidney stones     ROS:   All systems reviewed and negative except as noted in the HPI.   Past Surgical History  Procedure Laterality Date  . Appendectomy    . Abdominal hysterectomy    . Right carotid endarectomy      1999  . Pacemaker placement      2006  .  Colonoscopy      2006  . Cystoscopy w/ ureteral stent placement Left 04/19/2012    Procedure: CYSTOSCOPY WITH RETROGRADE PYELOGRAM/URETERAL STENT PLACEMENT ;  Surgeon: Ailene Rud, MD;  Location: [facility];  Service: Urology;  Laterality: Left;  . Breast biopsy Bilateral     x 7 total  . Eye surgery Bilateral     cataract extraction with IOL  . Cystoscopy with retrograde pyelogram, ureteroscopy and stent placement Left 06/30/2012    Procedure: CYSTOSCOPY WITH LEFT  RETROGRADE PYELOGRAM, URETEROSCOPY  with basketing of stone, AND STENT PLACEMENT, TRANSURETHRAL UNROOFING OF URETER.;  Surgeon: Alexis Frock, MD;  Location: [facility];  Service: Urology;  Laterality: Left;     Family History  Problem Relation Age of Onset  . Heart attack Mother     Died age 36  . Hyperlipidemia Mother   . Diabetes Mother   . Coronary artery disease Father   . Alcohol  abuse Father   . Hyperlipidemia Father   . Diabetes Sister   . Hypertension Sister   . Cancer Sister     Breast  . Cancer Sister     Breast     History   Social History  . Marital Status: Married    Spouse Name: N/A    Number of Children: N/A  . Years of Education: N/A   Occupational History  . Not on file.   Social History Main Topics  . Smoking status: Never Smoker   . Smokeless tobacco: Never Used  . Alcohol Use: No  . Drug Use: No  . Sexual Activity: No   Other Topics Concern  . Not on file   Social History Narrative     BP 138/82 mmHg  Pulse 80  Ht _0  (1.626 m)  Wt 148 lb 12.8 oz (67.495 kg)  BMI 25.53 kg/m2  SpO2 96%  Physical Exam:  Well appearing 79 year old woman, NAD HEENT: Unremarkable Neck:  No JVD, no thyromegally Back:  No CVA tenderness Lungs:  Clear with no wheezes, rales, or rhonchi. HEART:  Regular rate rhythm, no murmurs, no rubs, no clicks Abd:  soft, positive bowel sounds, no organomegally, no rebound, no guarding Ext:  2 plus pulses, no edema, no cyanosis, no clubbing Skin:  No rashes no nodules Neuro:  CN II through XII intact, motor grossly intact  DEVICE  Normal device function.  See PaceArt for details.   Assess/Plan:

## 2013-12-18 NOTE — Assessment & Plan Note (Signed)
Her blood pressure is reasonably well controlled. She will continue her current meds.  

## 2014-01-04 ENCOUNTER — Telehealth: Payer: Self-pay | Admitting: *Deleted

## 2014-01-04 NOTE — Telephone Encounter (Signed)
.  FAXED DX for lipid panel

## 2014-03-17 ENCOUNTER — Other Ambulatory Visit (HOSPITAL_COMMUNITY): Payer: Self-pay | Admitting: General Surgery

## 2014-03-17 ENCOUNTER — Ambulatory Visit (HOSPITAL_COMMUNITY)
Admission: RE | Admit: 2014-03-17 | Discharge: 2014-03-17 | Disposition: A | Discharge status: Home / Self Care | Payer: Medicare Other | Source: Ambulatory Visit | Attending: General Surgery | Admitting: General Surgery

## 2014-03-17 DIAGNOSIS — W19XXXA Unspecified fall, initial encounter: Secondary | ICD-10-CM

## 2014-03-17 DIAGNOSIS — M25511 Pain in right shoulder: Secondary | ICD-10-CM | POA: Diagnosis present

## 2014-03-17 DIAGNOSIS — M25611 Stiffness of right shoulder, not elsewhere classified: Secondary | ICD-10-CM | POA: Diagnosis not present

## 2014-03-20 ENCOUNTER — Telehealth: Payer: Self-pay | Admitting: Cardiology

## 2014-03-20 ENCOUNTER — Ambulatory Visit (INDEPENDENT_AMBULATORY_CARE_PROVIDER_SITE_OTHER): Payer: Medicare Other | Admitting: Orthopedic Surgery

## 2014-03-20 ENCOUNTER — Encounter: Payer: Self-pay | Admitting: Orthopedic Surgery

## 2014-03-20 ENCOUNTER — Ambulatory Visit (INDEPENDENT_AMBULATORY_CARE_PROVIDER_SITE_OTHER): Payer: Medicare Other | Admitting: *Deleted

## 2014-03-20 VITALS — BP 130/64 | Ht 64.0 in | Wt 148.8 lb

## 2014-03-20 DIAGNOSIS — M25511 Pain in right shoulder: Secondary | ICD-10-CM

## 2014-03-20 DIAGNOSIS — S329XXA Fracture of unspecified parts of lumbosacral spine and pelvis, initial encounter for closed fracture: Secondary | ICD-10-CM

## 2014-03-20 DIAGNOSIS — I495 Sick sinus syndrome: Secondary | ICD-10-CM

## 2014-03-20 LAB — MDC_IDC_ENUM_SESS_TYPE_REMOTE
Battery Remaining Longevity: 59 mo
Battery Voltage: 2.76 V
Brady Statistic AP VP Percent: 3 %
Brady Statistic AP VS Percent: 1 %
Brady Statistic AS VP Percent: 0 %
Brady Statistic AS VS Percent: 96 %
Date Time Interrogation Session: 20160222194141
Lead Channel Impedance Value: 498 Ohm
Lead Channel Impedance Value: 571 Ohm
Lead Channel Pacing Threshold Amplitude: 0.5 V
Lead Channel Pacing Threshold Pulse Width: 0.4 ms
Lead Channel Sensing Intrinsic Amplitude: 0.7 mV
MDC IDC MSMT BATTERY IMPEDANCE: 890 Ohm
MDC IDC MSMT LEADCHNL RV PACING THRESHOLD AMPLITUDE: 0.875 V
MDC IDC MSMT LEADCHNL RV PACING THRESHOLD PULSEWIDTH: 0.4 ms
MDC IDC MSMT LEADCHNL RV SENSING INTR AMPL: 8 mV
MDC IDC SET LEADCHNL RA PACING AMPLITUDE: 2.75 V
MDC IDC SET LEADCHNL RV PACING AMPLITUDE: 2.5 V
MDC IDC SET LEADCHNL RV PACING PULSEWIDTH: 0.4 ms
MDC IDC SET LEADCHNL RV SENSING SENSITIVITY: 4 mV

## 2014-03-20 MED ORDER — DOCUSATE SODIUM 100 MG PO CAPS: 100.0000 mg | ORAL_CAPSULE | Freq: Two times a day (BID) | ORAL | Status: DC

## 2014-03-20 MED ORDER — ACETAMINOPHEN-CODEINE #3 300-30 MG PO TABS: 1.0000 | ORAL_TABLET | Freq: Four times a day (QID) | ORAL | Status: DC | PRN

## 2014-03-20 NOTE — Progress Notes (Signed)
Remote pacemaker transmission.   

## 2014-03-20 NOTE — Progress Notes (Signed)
Subjective:   Chief Complaint  Patient presents with  . Follow-up     pelvic fracture, DOI 03/08/14, Romona Curls refer      Patient ID: Traci [name], female    DOB: 1934/07/12, 81 y.o.   MRN: [id number]  HPI  80 year old female fell on this 03/08/2014 fell in her yard. Complaints of pain in her right ischium and pain in the right shoulder with loss of motion in the right shoulder and weakness in the right shoulder. She also has difficulty weightbearing. The pain is nonradiating aching dull. No deformity and the leg.  She was referred to [place] by Dr. Romona Curls  Pharmacy [facility]  Primary care physician Dr. Lorriane Shire  Review of systems is negative except for some recent onset of blurred vision she does have a history of depression and difficulty sleeping  All other systems were negative  Past Medical History  Diagnosis Date  . Sick sinus syndrome     PAF in 8,9,10/2003,1/06;insignificant CAD in  08/2003  . Sick sinus syndrome     ddd pacing medtronic rhythm 05/2004   . Chronic anticoagulation     followed by PMD  . Hyperlipidemia   . Hypertension   . Cerebrovascular disease     right carotid endarectomy in 1999;negative duplex in 06/2004  . Pneumonia     bilateral pneumonia 2010  . Hyperglycemia     fasting  . Coronary artery disease   . Arthritis   . Pacemaker   . History of kidney stones    Past Surgical History  Procedure Laterality Date  . Appendectomy    . Abdominal hysterectomy    . Right carotid endarectomy      1999  . Pacemaker placement      2006  . Colonoscopy      2006  . Cystoscopy w/ ureteral stent placement Left 04/19/2012    Procedure: CYSTOSCOPY WITH RETROGRADE PYELOGRAM/URETERAL STENT PLACEMENT ;  Surgeon: Ailene Rud, MD;  Location: [facility];  Service: Urology;  Laterality: Left;  . Breast biopsy Bilateral     x 7 total  . Eye surgery Bilateral     cataract extraction with IOL  . Cystoscopy with retrograde pyelogram,  ureteroscopy and stent placement Left 06/30/2012    Procedure: CYSTOSCOPY WITH LEFT  RETROGRADE PYELOGRAM, URETEROSCOPY  with basketing of stone, AND STENT PLACEMENT, TRANSURETHRAL UNROOFING OF URETER.;  Surgeon: Alexis Frock, MD;  Location: [facility];  Service: Urology;  Laterality: Left;     Review of Systems     Objective:   Physical Exam  Normal appearance, she is oriented to person and place but she seems confused and this is most likely related to the medication that her son gave her that he was given. Mood is flat affect is flat  Exam the crutches but poorly she is in a wheelchair today but I saw her walking with crutches at Dr. Hulan Amato office last week  She has tenderness in the ischium but normal range of motion in both hips is symmetric and equal there is no leg length discrepancy both hips are stable muscle tone normal skin intact  Normal distal pulses no edema. Normal sensation both lower extremities  She does have tenderness in the proximal shoulder decreased range of motion in all planes shoulder stable strength shows weakness of her rotator cuff skin intact no bruising distal pulses normal  Independent interpretation of the images taken at the hospital no fracture is noted in the shoulder  Fracture  the superior knee appear pubic ramus of the right pelvis.    Assessment & Plan:  We recommend a walker for comfort with wheels Change from hydrocodone from her son to Tylenol with codeine No. 3 one every 6 hours for pain and Colace to prevent constipation Follow-up 4 weeks x-ray pelvis

## 2014-03-20 NOTE — Telephone Encounter (Signed)
LMOVM reminding pt to send remote transmission.   

## 2014-03-31 ENCOUNTER — Encounter: Payer: Self-pay | Admitting: Cardiology

## 2014-04-05 ENCOUNTER — Telehealth: Payer: Self-pay | Admitting: Orthopedic Surgery

## 2014-04-05 ENCOUNTER — Encounter: Payer: Self-pay | Admitting: Internal Medicine

## 2014-04-05 NOTE — Telephone Encounter (Signed)
Patient is asking for a returned call, regarding some instructions please advise?

## 2014-04-18 NOTE — Telephone Encounter (Signed)
Patient aware xray will be taken at time of visit

## 2014-04-19 ENCOUNTER — Encounter: Payer: Self-pay | Admitting: Orthopedic Surgery

## 2014-04-20 ENCOUNTER — Ambulatory Visit (INDEPENDENT_AMBULATORY_CARE_PROVIDER_SITE_OTHER): Payer: Medicare Other | Admitting: Orthopedic Surgery

## 2014-04-20 ENCOUNTER — Encounter: Payer: Self-pay | Admitting: Orthopedic Surgery

## 2014-04-20 ENCOUNTER — Ambulatory Visit (INDEPENDENT_AMBULATORY_CARE_PROVIDER_SITE_OTHER): Payer: Medicare Other

## 2014-04-20 VITALS — BP 136/80 | Ht 64.0 in | Wt 148.0 lb

## 2014-04-20 DIAGNOSIS — S329XXD Fracture of unspecified parts of lumbosacral spine and pelvis, subsequent encounter for fracture with routine healing: Secondary | ICD-10-CM | POA: Diagnosis not present

## 2014-04-20 DIAGNOSIS — M75101 Unspecified rotator cuff tear or rupture of right shoulder, not specified as traumatic: Secondary | ICD-10-CM | POA: Diagnosis not present

## 2014-04-20 NOTE — Patient Instructions (Addendum)
Home exercises   Joint Injection Care After Refer to this sheet in the next few days. These instructions provide you with information on caring for yourself after you have had a joint injection. Your caregiver also may give you more specific instructions. Your treatment has been planned according to current medical practices, but problems sometimes occur. Call your caregiver if you have any problems or questions after your procedure. After any type of joint injection, it is not uncommon to experience:  Soreness, swelling, or bruising around the injection site.  Mild numbness, tingling, or weakness around the injection site caused by the numbing medicine used before or with the injection. It also is possible to experience the following effects associated with the specific agent after injection:  Iodine-based contrast agents:  Allergic reaction (itching, hives, widespread redness, and swelling beyond the injection site).  Corticosteroids (These effects are rare.):  Allergic reaction.  Increased blood sugar levels (If you have diabetes and you notice that your blood sugar levels have increased, notify your caregiver).  Increased blood pressure levels.  Mood swings.  Hyaluronic acid in the use of viscosupplementation.  Temporary heat or redness.  Temporary rash and itching.  Increased fluid accumulation in the injected joint. These effects all should resolve within a day after your procedure.  HOME CARE INSTRUCTIONS  Limit yourself to light activity the day of your procedure. Avoid lifting heavy objects, bending, stooping, or twisting.  Take prescription or over-the-counter pain medication as directed by your caregiver.  You may apply ice to your injection site to reduce pain and swelling the day of your procedure. Ice may be applied 03-04 times:  Put ice in a plastic bag.  Place a towel between your skin and the bag.  Leave the ice on for no longer than 15-20 minutes each  time. SEEK IMMEDIATE MEDICAL CARE IF:   Pain and swelling get worse rather than better or extend beyond the injection site.  Numbness does not go away.  Blood or fluid continues to leak from the injection site.  You have chest pain.  You have swelling of your face or tongue.  You have trouble breathing or you become dizzy.  You develop a fever, chills, or severe tenderness at the injection site that last longer than 1 day. MAKE SURE YOU:  Understand these instructions.  Watch your condition.  Get help right away if you are not doing well or if you get worse. Document Released: 09/26/2010 Document Revised: 04/07/2011 Document Reviewed: 09/26/2010 Exit[employer] Patient Information 2015 [employer]. This information is not intended to replace advice given to you by your health care provider. Make sure you discuss any questions you have with your health care provider.  

## 2014-04-20 NOTE — Progress Notes (Signed)
Chief Complaint  Patient presents with  . Follow-up    4 week recheck on pelvic fracture, DOI 03-08-14.    Six-week follow-up pelvic fracture x-ray shows fracture healing nondisplaced  Patient says that she's having no pain below the waist and she is walking without assistive devices  New problem Chief complaint right shoulder pain  The patient fell back in February she complains of pain in the right shoulder which is dull aching moderately severe worse with activity and only noticed when she fell  Review of systems she does not complain of fever or chills she has noticed no redness or erythema around the joint no warmth she has noticed crepitance and weakness and inability to raise her arm  Past Medical History  Diagnosis Date  . Sick sinus syndrome     PAF in 8,9,10/2003,1/06;insignificant CAD in  08/2003  . Sick sinus syndrome     ddd pacing medtronic rhythm 05/2004   . Chronic anticoagulation     followed by PMD  . Hyperlipidemia   . Hypertension   . Cerebrovascular disease     right carotid endarectomy in 1999;negative duplex in 06/2004  . Pneumonia     bilateral pneumonia 2010  . Hyperglycemia     fasting  . Coronary artery disease   . Arthritis   . Pacemaker   . History of kidney stones     BP 136/80 mmHg  Ht 5\' 4"  (1.626 m)  Wt 148 lb (67.132 kg)  BMI 25.39 kg/m2 Her appearance is normal she is oriented 3 her mood and affect are normal her ambulation is normal  Her right shoulder is tender in the anterolateral deltoid area with painful range of motion and crepitance and decreased range of motion with forward elevation only up to 100, passive range of motion full drop test negative shoulder stable over exam shows no weakness in the rotator cuff  Skin is intact no erythema  Sensation is normal she has a good pulse and no lymphadenopathy in the axilla  Impression rotator cuff syndrome  I looked at the x-ray they took when she fell she has a high riding humerus  consistent with chronic rotator cuff tear  Recommend subacromial injection and home exercise program with Codman exercises for 6 weeks   Procedure note the subacromial injection shoulder RIGHT  Verbal consent was obtained to inject the  RIGHT   Shoulder  Timeout was completed to confirm the injection site is a subacromial space of the  RIGHT  shoulder   Medication used Depo-Medrol 40 mg and lidocaine 1% 3 cc  Anesthesia was provided by ethyl chloride  The injection was performed in the RIGHT  posterior subacromial space. After pinning the skin with alcohol and anesthetized the skin with ethyl chloride the subacromial space was injected using a 20-gauge needle. There were no complications  Sterile dressing was applied.

## 2014-05-16 ENCOUNTER — Other Ambulatory Visit (HOSPITAL_COMMUNITY): Payer: Self-pay | Admitting: Family Medicine

## 2014-05-16 DIAGNOSIS — Z1231 Encounter for screening mammogram for malignant neoplasm of breast: Secondary | ICD-10-CM

## 2014-05-18 ENCOUNTER — Ambulatory Visit (HOSPITAL_COMMUNITY)
Admission: RE | Admit: 2014-05-18 | Discharge: 2014-05-18 | Disposition: A | Discharge status: Home / Self Care | Payer: Medicare Other | Source: Ambulatory Visit | Attending: Family Medicine | Admitting: Family Medicine

## 2014-05-18 DIAGNOSIS — Z1231 Encounter for screening mammogram for malignant neoplasm of breast: Secondary | ICD-10-CM | POA: Diagnosis not present

## 2014-06-21 ENCOUNTER — Telehealth: Payer: Self-pay | Admitting: Internal Medicine

## 2014-06-21 ENCOUNTER — Encounter: Payer: Medicare Other | Admitting: *Deleted

## 2014-06-21 ENCOUNTER — Telehealth: Payer: Self-pay | Admitting: Cardiology

## 2014-06-21 NOTE — Telephone Encounter (Signed)
New message      Pt states she cannot transmit.  She called the company and they will send her something (not sure if it is another monitor).  Pt said it is the new area code that is preventing her from transmitting.  As soon as she get the new box/adaptor, she will try to transmit again

## 2014-06-21 NOTE — Telephone Encounter (Signed)
Spoke with pt and reminded pt of remote transmission that is due today. Pt verbalized understanding.   

## 2014-06-22 ENCOUNTER — Encounter: Payer: Self-pay | Admitting: Cardiology

## 2014-06-30 ENCOUNTER — Encounter: Payer: Self-pay | Admitting: Internal Medicine

## 2014-06-30 ENCOUNTER — Ambulatory Visit (INDEPENDENT_AMBULATORY_CARE_PROVIDER_SITE_OTHER): Payer: Medicare Other | Admitting: *Deleted

## 2014-06-30 DIAGNOSIS — Z950 Presence of cardiac pacemaker: Secondary | ICD-10-CM | POA: Diagnosis not present

## 2014-06-30 DIAGNOSIS — I4891 Unspecified atrial fibrillation: Secondary | ICD-10-CM | POA: Diagnosis not present

## 2014-06-30 NOTE — Telephone Encounter (Signed)
Pt received [employer]. I instructed pt there is no longer a need to plug into phone line/jack. Pt properly connected device. Remote transmission sent and received.

## 2014-06-30 NOTE — Telephone Encounter (Signed)
New Prob   Pt calling in requesting help transmitting. Please call.

## 2014-06-30 NOTE — Progress Notes (Signed)
Remote pacemaker transmission.   

## 2014-07-04 LAB — CUP PACEART REMOTE DEVICE CHECK
Battery Remaining Longevity: 56 mo
Battery Voltage: 2.76 V
Brady Statistic AS VP Percent: 0 %
Brady Statistic AS VS Percent: 95 %
Lead Channel Impedance Value: 562 Ohm
Lead Channel Pacing Threshold Pulse Width: 0.4 ms
Lead Channel Sensing Intrinsic Amplitude: 0.7 mV
Lead Channel Sensing Intrinsic Amplitude: 8 mV
Lead Channel Setting Pacing Amplitude: 2.5 V
Lead Channel Setting Pacing Amplitude: 2.75 V
Lead Channel Setting Pacing Pulse Width: 0.46 ms
Lead Channel Setting Sensing Sensitivity: 2.8 mV
MDC IDC MSMT BATTERY IMPEDANCE: 941 Ohm
MDC IDC MSMT LEADCHNL RA IMPEDANCE VALUE: 471 Ohm
MDC IDC MSMT LEADCHNL RV PACING THRESHOLD AMPLITUDE: 0.75 V
MDC IDC SESS DTM: 20160603195409
MDC IDC STAT BRADY AP VP PERCENT: 3 %
MDC IDC STAT BRADY AP VS PERCENT: 1 %

## 2014-07-17 ENCOUNTER — Encounter: Payer: Self-pay | Admitting: Cardiology

## 2014-09-29 ENCOUNTER — Telehealth: Payer: Self-pay | Admitting: Internal Medicine

## 2014-09-29 NOTE — Telephone Encounter (Signed)
Instructed pt how to send manual transmission. Pt verbalized understanding.

## 2014-09-29 NOTE — Telephone Encounter (Signed)
New message    Pt is needing help sending transmission Please call to discuss

## 2014-10-03 ENCOUNTER — Ambulatory Visit (INDEPENDENT_AMBULATORY_CARE_PROVIDER_SITE_OTHER): Payer: Medicare Other | Admitting: *Deleted

## 2014-10-03 ENCOUNTER — Telehealth: Payer: Self-pay | Admitting: Cardiology

## 2014-10-03 DIAGNOSIS — I495 Sick sinus syndrome: Secondary | ICD-10-CM | POA: Diagnosis not present

## 2014-10-03 NOTE — Telephone Encounter (Signed)
Spoke with pt and reminded pt of remote transmission that is due today. Pt verbalized understanding.   

## 2014-10-06 NOTE — Progress Notes (Signed)
Remote pacemaker transmission.   

## 2014-10-13 ENCOUNTER — Encounter (HOSPITAL_COMMUNITY): Payer: Self-pay | Admitting: Cardiology

## 2014-10-13 ENCOUNTER — Emergency Department (HOSPITAL_COMMUNITY): Payer: Medicare Other

## 2014-10-13 ENCOUNTER — Inpatient Hospital Stay (HOSPITAL_COMMUNITY)
Admission: EM | Admit: 2014-10-13 | Discharge: 2014-10-17 | DRG: 287 | Disposition: A | Discharge status: Home / Self Care | Payer: Medicare Other | Attending: Internal Medicine | Admitting: Internal Medicine

## 2014-10-13 DIAGNOSIS — I200 Unstable angina: Secondary | ICD-10-CM

## 2014-10-13 DIAGNOSIS — I480 Paroxysmal atrial fibrillation: Secondary | ICD-10-CM | POA: Diagnosis not present

## 2014-10-13 DIAGNOSIS — Z5181 Encounter for therapeutic drug level monitoring: Secondary | ICD-10-CM

## 2014-10-13 DIAGNOSIS — I2510 Atherosclerotic heart disease of native coronary artery without angina pectoris: Secondary | ICD-10-CM | POA: Diagnosis present

## 2014-10-13 DIAGNOSIS — Z950 Presence of cardiac pacemaker: Secondary | ICD-10-CM

## 2014-10-13 DIAGNOSIS — I482 Chronic atrial fibrillation: Principal | ICD-10-CM | POA: Diagnosis present

## 2014-10-13 DIAGNOSIS — I10 Essential (primary) hypertension: Secondary | ICD-10-CM | POA: Diagnosis present

## 2014-10-13 DIAGNOSIS — I5032 Chronic diastolic (congestive) heart failure: Secondary | ICD-10-CM | POA: Diagnosis present

## 2014-10-13 DIAGNOSIS — G8929 Other chronic pain: Secondary | ICD-10-CM | POA: Diagnosis not present

## 2014-10-13 DIAGNOSIS — I495 Sick sinus syndrome: Secondary | ICD-10-CM

## 2014-10-13 DIAGNOSIS — Z7901 Long term (current) use of anticoagulants: Secondary | ICD-10-CM

## 2014-10-13 DIAGNOSIS — I4891 Unspecified atrial fibrillation: Secondary | ICD-10-CM | POA: Diagnosis present

## 2014-10-13 DIAGNOSIS — E785 Hyperlipidemia, unspecified: Secondary | ICD-10-CM | POA: Diagnosis not present

## 2014-10-13 DIAGNOSIS — R079 Chest pain, unspecified: Secondary | ICD-10-CM

## 2014-10-13 DIAGNOSIS — I25110 Atherosclerotic heart disease of native coronary artery with unstable angina pectoris: Secondary | ICD-10-CM | POA: Diagnosis not present

## 2014-10-13 HISTORY — DX: Impaired glucose tolerance (oral): R73.02

## 2014-10-13 HISTORY — DX: Essential (primary) hypertension: I10

## 2014-10-13 HISTORY — DX: Peripheral vascular disease, unspecified: I73.9

## 2014-10-13 HISTORY — DX: Atherosclerotic heart disease of native coronary artery without angina pectoris: I25.10

## 2014-10-13 HISTORY — DX: Disorder of arteries and arterioles, unspecified: I77.9

## 2014-10-13 HISTORY — DX: Unspecified atrial fibrillation: I48.91

## 2014-10-13 LAB — BASIC METABOLIC PANEL
Anion gap: 8 (ref 5–15)
Anion gap: 9 (ref 5–15)
BUN: 13 mg/dL (ref 6–20)
BUN: 10 mg/dL (ref 6–20)
CHLORIDE: 101 mmol/L (ref 101–111)
CHLORIDE: 101 mmol/L (ref 101–111)
CO2: 27 mmol/L (ref 22–32)
CO2: 31 mmol/L (ref 22–32)
CREATININE: 1 mg/dL (ref 0.44–1.00)
Calcium: 9.3 mg/dL (ref 8.9–10.3)
Calcium: 9.6 mg/dL (ref 8.9–10.3)
Creatinine, Ser: 1 mg/dL (ref 0.44–1.00)
GFR calc Af Amer: >60 mL/min (ref >60)
GFR calc Af Amer: >60 mL/min (ref >60)
GFR calc non Af Amer: 53 mL/min — ABNORMAL LOW (ref >60)
GFR calc non Af Amer: 53 mL/min — ABNORMAL LOW (ref >60)
Glucose, Bld: 162 mg/dL — ABNORMAL HIGH (ref 65–99)
Glucose, Bld: 91 mg/dL (ref 65–99)
Potassium: 3.6 mmol/L (ref 3.5–5.1)
Potassium: 4.1 mmol/L (ref 3.5–5.1)
SODIUM: 136 mmol/L (ref 135–145)
Sodium: 141 mmol/L (ref 135–145)

## 2014-10-13 LAB — SURGICAL PCR SCREEN
MRSA, PCR: NEGATIVE
Staphylococcus aureus: NEGATIVE

## 2014-10-13 LAB — TROPONIN I
Troponin I: <0.03 ng/mL (ref <0.031)
Troponin I: <0.03 ng/mL (ref <0.031)
Troponin I: 0.03 ng/mL (ref <0.031)

## 2014-10-13 LAB — CUP PACEART REMOTE DEVICE CHECK
Battery Impedance: 1019 Ohm
Battery Remaining Longevity: 4.5
Brady Statistic AP VP Percent: 3.3 %
Brady Statistic AP VS Percent: 1.2 %
Brady Statistic AS VS Percent: 95.1 %
Lead Channel Impedance Value: 505 Ohm
Lead Channel Impedance Value: 621 Ohm
Lead Channel Pacing Threshold Amplitude: 0.5 V
Lead Channel Pacing Threshold Amplitude: 0.875 V
Lead Channel Pacing Threshold Pulse Width: 0.4 ms
Lead Channel Pacing Threshold Pulse Width: 0.4 ms
Lead Channel Sensing Intrinsic Amplitude: 0.7 mV
Lead Channel Sensing Intrinsic Amplitude: 8 mV
Lead Channel Setting Pacing Amplitude: 2.75 V
Lead Channel Setting Pacing Pulse Width: 0.46 ms
MDC IDC MSMT BATTERY VOLTAGE: 2.76 V
MDC IDC SESS DTM: 20160916092539
MDC IDC SET LEADCHNL RV PACING AMPLITUDE: 2.5 V
MDC IDC SET LEADCHNL RV SENSING SENSITIVITY: 2.8 mV
MDC IDC STAT BRADY AS VP PERCENT: 0.4 %

## 2014-10-13 LAB — CBC
HCT: 43.6 % (ref 36.0–46.0)
Hemoglobin: 15.1 g/dL — ABNORMAL HIGH (ref 12.0–15.0)
MCH: 33 pg (ref 26.0–34.0)
MCHC: 34.6 g/dL (ref 30.0–36.0)
MCV: 95.4 fL (ref 78.0–100.0)
PLATELETS: 227 10*3/uL (ref 150–400)
RBC: 4.57 MIL/uL (ref 3.87–5.11)
RDW: 13.9 % (ref 11.5–15.5)
WBC: 10.8 10*3/uL — ABNORMAL HIGH (ref 4.0–10.5)

## 2014-10-13 LAB — PROTIME-INR
INR: 1.51 — AB (ref 0.00–1.49)
Prothrombin Time: 18.3 seconds — ABNORMAL HIGH (ref 11.6–15.2)

## 2014-10-13 LAB — DIGOXIN LEVEL: Digoxin Level: 0.7 ng/mL — ABNORMAL LOW (ref 0.8–2.0)

## 2014-10-13 MED ORDER — ONDANSETRON HCL 4 MG/2ML IJ SOLN: 4.0000 mg | Freq: Four times a day (QID) | INTRAMUSCULAR | Status: DC | PRN

## 2014-10-13 MED ORDER — FESOTERODINE FUMARATE ER 4 MG PO TB24
4.0000 mg | ORAL_TABLET | Freq: Every day | ORAL | Status: DC
  Administered 2014-10-14 – 2014-10-15 (×2): 4 mg via ORAL
  Filled 2014-10-13 (×4): qty 1

## 2014-10-13 MED ORDER — NITROGLYCERIN IN D5W 200-5 MCG/ML-% IV SOLN
INTRAVENOUS | Status: AC
  Filled 2014-10-13: qty 250

## 2014-10-13 MED ORDER — DOCUSATE SODIUM 100 MG PO CAPS
100.0000 mg | ORAL_CAPSULE | Freq: Two times a day (BID) | ORAL | Status: DC
  Administered 2014-10-13 – 2014-10-17 (×7): 100 mg via ORAL
  Filled 2014-10-13 (×8): qty 1

## 2014-10-13 MED ORDER — CALCIUM 1200 1200-1000 MG-UNIT PO CHEW: CHEWABLE_TABLET | Freq: Two times a day (BID) | ORAL | Status: DC

## 2014-10-13 MED ORDER — VITAMIN D 1000 UNITS PO TABS
5000.0000 [IU] | ORAL_TABLET | Freq: Every day | ORAL | Status: DC
  Administered 2014-10-13 – 2014-10-17 (×5): 5000 [IU] via ORAL
  Filled 2014-10-13 (×5): qty 5

## 2014-10-13 MED ORDER — ACETAMINOPHEN 325 MG PO TABS
650.0000 mg | ORAL_TABLET | ORAL | Status: DC | PRN
  Administered 2014-10-17: 650 mg via ORAL
  Filled 2014-10-13: qty 2

## 2014-10-13 MED ORDER — ASPIRIN 81 MG PO CHEW
324.0000 mg | CHEWABLE_TABLET | ORAL | Status: AC
  Administered 2014-10-13: 324 mg via ORAL
  Filled 2014-10-13: qty 4

## 2014-10-13 MED ORDER — ASPIRIN EC 81 MG PO TBEC
81.0000 mg | DELAYED_RELEASE_TABLET | Freq: Every day | ORAL | Status: DC
  Administered 2014-10-14 – 2014-10-17 (×4): 81 mg via ORAL
  Filled 2014-10-13 (×4): qty 1

## 2014-10-13 MED ORDER — NITROGLYCERIN IN D5W 200-5 MCG/ML-% IV SOLN: 3.0000 ug/min | INTRAVENOUS | Status: DC

## 2014-10-13 MED ORDER — ASPIRIN 300 MG RE SUPP: 300.0000 mg | RECTAL | Status: AC

## 2014-10-13 MED ORDER — LOSARTAN POTASSIUM 50 MG PO TABS
50.0000 mg | ORAL_TABLET | Freq: Two times a day (BID) | ORAL | Status: DC
  Administered 2014-10-13 – 2014-10-17 (×8): 50 mg via ORAL
  Filled 2014-10-13 (×8): qty 1

## 2014-10-13 MED ORDER — CHOLECALCIFEROL 125 MCG (5000 UT) PO CAPS: 5000.0000 [IU] | ORAL_CAPSULE | Freq: Every day | ORAL | Status: DC

## 2014-10-13 MED ORDER — CLONAZEPAM 1 MG PO TABS
1.0000 mg | ORAL_TABLET | Freq: Every evening | ORAL | Status: DC | PRN
  Administered 2014-10-13 – 2014-10-16 (×4): 1 mg via ORAL
  Filled 2014-10-13 (×4): qty 1

## 2014-10-13 MED ORDER — HEPARIN (PORCINE) IN NACL 100-0.45 UNIT/ML-% IJ SOLN
1100.0000 [IU]/h | INTRAMUSCULAR | Status: DC
  Administered 2014-10-13: 850 [IU]/h via INTRAVENOUS
  Administered 2014-10-14 – 2014-10-15 (×2): 1100 [IU]/h via INTRAVENOUS
  Filled 2014-10-13 (×3): qty 250

## 2014-10-13 MED ORDER — CYANOCOBALAMIN 250 MCG PO TABS
250.0000 ug | ORAL_TABLET | Freq: Every day | ORAL | Status: DC
  Administered 2014-10-14 – 2014-10-17 (×4): 250 ug via ORAL
  Filled 2014-10-13 (×5): qty 1

## 2014-10-13 MED ORDER — FENTANYL CITRATE (PF) 100 MCG/2ML IJ SOLN
50.0000 ug | INTRAMUSCULAR | Status: DC | PRN
  Administered 2014-10-13 (×2): 50 ug via INTRAVENOUS
  Filled 2014-10-13: qty 2

## 2014-10-13 MED ORDER — INFLUENZA VAC SPLIT QUAD 0.5 ML IM SUSY
0.5000 mL | PREFILLED_SYRINGE | INTRAMUSCULAR | Status: AC
Start: 2014-10-14 — End: 2014-10-14
  Administered 2014-10-14: 0.5 mL via INTRAMUSCULAR
  Filled 2014-10-13: qty 0.5

## 2014-10-13 MED ORDER — NIACIN ER (ANTIHYPERLIPIDEMIC) 500 MG PO TBCR
500.0000 mg | EXTENDED_RELEASE_TABLET | Freq: Every day | ORAL | Status: DC
  Filled 2014-10-13: qty 1

## 2014-10-13 MED ORDER — VITAMIN C 500 MG PO TABS
1000.0000 mg | ORAL_TABLET | Freq: Every day | ORAL | Status: DC
  Administered 2014-10-13 – 2014-10-17 (×5): 1000 mg via ORAL
  Filled 2014-10-13 (×5): qty 2

## 2014-10-13 MED ORDER — AMLODIPINE BESYLATE 5 MG PO TABS
5.0000 mg | ORAL_TABLET | Freq: Every day | ORAL | Status: DC
  Administered 2014-10-14 – 2014-10-17 (×4): 5 mg via ORAL
  Filled 2014-10-13 (×5): qty 1

## 2014-10-13 MED ORDER — HEPARIN BOLUS VIA INFUSION
2000.0000 [IU] | Freq: Once | INTRAVENOUS | Status: AC
  Administered 2014-10-13: 2000 [IU] via INTRAVENOUS
  Filled 2014-10-13: qty 2000

## 2014-10-13 MED ORDER — NIACIN ER 500 MG PO CPCR
500.0000 mg | ORAL_CAPSULE | Freq: Every day | ORAL | Status: DC
  Administered 2014-10-13 – 2014-10-16 (×4): 500 mg via ORAL
  Filled 2014-10-13 (×5): qty 1

## 2014-10-13 MED ORDER — NEBIVOLOL HCL 10 MG PO TABS
10.0000 mg | ORAL_TABLET | Freq: Every morning | ORAL | Status: DC
  Administered 2014-10-14 – 2014-10-17 (×4): 10 mg via ORAL
  Filled 2014-10-13 (×4): qty 1

## 2014-10-13 MED ORDER — GLUCOSAMINE-CHONDROITIN 500-400 MG PO TABS: 1.0000 | ORAL_TABLET | Freq: Three times a day (TID) | ORAL | Status: DC

## 2014-10-13 MED ORDER — DIGOXIN 125 MCG PO TABS
125.0000 ug | ORAL_TABLET | Freq: Every morning | ORAL | Status: DC
  Administered 2014-10-14 – 2014-10-17 (×4): 125 ug via ORAL
  Filled 2014-10-13 (×5): qty 1

## 2014-10-13 MED ORDER — ACETAMINOPHEN-CODEINE #3 300-30 MG PO TABS: 1.0000 | ORAL_TABLET | Freq: Four times a day (QID) | ORAL | Status: DC | PRN

## 2014-10-13 MED ORDER — CALCIUM CARBONATE-VITAMIN D 500-200 MG-UNIT PO TABS
1.0000 | ORAL_TABLET | Freq: Two times a day (BID) | ORAL | Status: DC
  Administered 2014-10-13 – 2014-10-17 (×9): 1 via ORAL
  Filled 2014-10-13 (×9): qty 1

## 2014-10-13 MED ORDER — ONDANSETRON HCL 4 MG/2ML IJ SOLN
4.0000 mg | Freq: Once | INTRAMUSCULAR | Status: AC
  Administered 2014-10-13: 4 mg via INTRAVENOUS
  Filled 2014-10-13: qty 2

## 2014-10-13 MED ORDER — VITAMIN B-12 250 MCG PO TABS: 250.0000 ug | ORAL_TABLET | Freq: Every day | ORAL | Status: DC

## 2014-10-13 MED ORDER — NITROGLYCERIN IN D5W 200-5 MCG/ML-% IV SOLN
5.0000 ug/min | Freq: Once | INTRAVENOUS | Status: AC
  Administered 2014-10-13: 5 ug/min via INTRAVENOUS

## 2014-10-13 NOTE — ED Notes (Signed)
[facility] en route, Report given.

## 2014-10-13 NOTE — ED Notes (Signed)
Pt denies chest pain and tightness

## 2014-10-13 NOTE — Consult Note (Signed)
CARDIOLOGY CONSULT NOTE   Patient ID: Traci [name] MRN: [id number] DOB/AGE: 05-04-1934 80 y.o.  Admit Date: 10/13/2014 Referring Physician: ER MD Primary Physician: Traci Curet, MD Consulting Cardiologist: Traci Lesches MD Primary Cardiologist:Traci [name], Traci Overlie MD Reason for Consultation: Unstable Angina  Clinical Summary Traci [name] is a 80 y.o.female with history of paroxysmal atrial fibrillation, CHADSVASC Score of 4 on coumadin, PPM in the setting of SSS, hypertension, minor CAD at cardiac catheterization 2005, now presents to ER with recent sudden onset substernal chest pain which began when she was at chiropractor's office today around 11:15. She describes it as chest tightness with associated dyspnea and weakness. The pain continues in ER, and she has been given fentanyl and a NTG has been started. Labs are pending. She has taken her regular medications this am.   She states she has not had this type of pain in the past. With medication, she is beginning to feel better.   On arrival, BP 154/89 HR 62, O2 Sat 97%. CBC is reviewed. Mildly elevate WBC at 10.8, no evidence of anemia. Chemistries unremarkable. Creatinine 1.00   EKG demonstrates atrial fibrillation with diffuse ST depression and T wave inversions in the intrinsically conducted beats with ventricular pacing. She has had her PPM interrogated on 10/03/2014 and found to be functioning appropriately.  CXR shows some pulmonary vascular congestion.   Allergies  Allergen Reactions  . Morphine Nausea Only  . Penicillins     Home Medications No current facility-administered medications on file prior to encounter.   Current Outpatient Prescriptions on File Prior to Encounter  Medication Sig Dispense Refill  . acetaminophen-codeine (TYLENOL #3) 300-30 MG per tablet Take 1 tablet by mouth every 6 (six) hours as needed for moderate pain. 60 tablet 0  . amLODipine (NORVASC) 5 MG tablet Take 1 tablet (5 mg total) by mouth  daily. 30 tablet 3  . Ascorbic Acid (VITAMIN C) 1000 MG tablet Take 1,000 mg by mouth daily.     Marland Kitchen BEE POLLEN PO Take by mouth.    . Blood Glucose Monitoring Suppl (BLOOD GLUCOSE METER KIT AND SUPPLIES) Dispense based on patient and insurance preference. Use to monitor FSBS one time daily as directed. DX: 250.00 1 each 0  . Calcium Carbonate-Vit D-Min (CALCIUM 1200 PO) Take 1 capsule by mouth 2 (two) times daily.     . Cholecalciferol (CVS D3) 5000 UNITS capsule Take 5,000 Units by mouth daily.    . clonazePAM (KLONOPIN) 1 MG tablet TAKE 1 TABLET BY MOUTH AT BEDTIME AS NEEDED 30 tablet 2  . digoxin (LANOXIN) 0.125 MG tablet Take 1 tablet (125 mcg total) by mouth every morning. 90 tablet 3  . docusate sodium (COLACE) 100 MG capsule Take 1 capsule (100 mg total) by mouth 2 (two) times daily. 60 capsule 0  . fesoterodine (TOVIAZ) 4 MG TB24 tablet Take 4 mg by mouth daily.    Marland Kitchen glucosamine-chondroitin 500-400 MG tablet Take 1 tablet by mouth 3 (three) times daily.    Marland Kitchen LECITHIN PO Take by mouth.    . losartan (COZAAR) 100 MG tablet Take 50 mg by mouth 2 (two) times daily.    . nebivolol (BYSTOLIC) 10 MG tablet Take 10 mg by mouth every morning.    . niacin (NIASPAN) 500 MG CR tablet Take 500 mg by mouth at bedtime.    . pyridoxine (B-6) 100 MG tablet Take 100 mg by mouth as needed.     . vitamin B-12 (CYANOCOBALAMIN) 250 MCG tablet  Take 250 mcg by mouth daily.    Marland Kitchen warfarin (COUMADIN) 5 MG tablet Take 2.55m (1/2 tablet) on M,T,,Th,F,Sat. Take 540m(1 tablet) on Sun.Wed      Past Medical History  Diagnosis Date  . Sick sinus syndrome     Medtronic PPM  . Chronic anticoagulation     Followed by PMD  . Hyperlipidemia   . Essential hypertension   . Carotid artery disease     Right carotid endarectomy 1999  . History of pneumonia     2010  . Glucose intolerance (impaired glucose tolerance)   . Coronary atherosclerosis     Minor at cardiac catheterization 2005  . Arthritis   . History of  kidney stones   . Atrial fibrillation     Past Surgical History  Procedure Laterality Date  . Appendectomy    . Abdominal hysterectomy    . Carotid endarectomy Right     1999  . Pacemaker placement      2006  . Colonoscopy      2006  . Cystoscopy w/ ureteral stent placement Left 04/19/2012    Procedure: CYSTOSCOPY WITH RETROGRADE PYELOGRAM/URETERAL STENT PLACEMENT ;  Surgeon: Traci [name];  Location: [facility];  Service: Urology;  Laterality: Left;  . Breast biopsy Bilateral     x 7 total  . Eye surgery Bilateral     Cataract extraction with IOL  . Cystoscopy with retrograde pyelogram, ureteroscopy and stent placement Left 06/30/2012    Procedure: CYSTOSCOPY WITH LEFT  RETROGRADE PYELOGRAM, URETEROSCOPY  with basketing of stone, AND STENT PLACEMENT, TRANSURETHRAL UNROOFING OF URETER.;  Surgeon: Traci [name];  Location: [facility];  Service: Urology;  Laterality: Left;    Family History  Problem Relation Age of Onset  . Heart attack Mother     Died age 78. Hyperlipidemia Mother   . Diabetes Mother   . Coronary artery disease Father   . Alcohol abuse Father   . Hyperlipidemia Father   . Diabetes Sister   . Hypertension Sister   . Breast cancer Sister   . Breast cancer Sister     Social History Traci [name] reports that she has never smoked. She has never used smokeless tobacco. Ms. SaLindenbergereports that she does not drink alcohol.  Review of Systems Complete review of systems are found to be negative unless outlined in H&P above. No recent change in functional capacity. Has had chronic trouble with lower back and leg pain.  Physical Examination Blood pressure 137/83, pulse 61, temperature 97.5 F (36.4 C), temperature source Oral, resp. rate 15, height 5' 4.5" (1.638 m), weight 152 lb (68.947 kg), SpO2 96 %.  Telemetry: Atrial fibrillation with ventricular pacing.  GEN: Complaining of substernal chest pain HEENT: Conjunctiva and lids normal, oropharynx clear  with moist mucosa. Neck: Supple, no elevated JVP or carotid bruits, no thyromegaly. Lungs: Clear to auscultation, nonlabored breathing at rest. Cardiac: Irregular rate and rhythm, no S3 or significant systolic murmur, no pericardial rub. Abdomen: Soft, nontender, bowel sounds present, no guarding or rebound. Extremities: No pitting edema, distal pulses 2+. Skin: Warm and dry. Musculoskeletal: No kyphosis.  Neuropsychiatric: Alert and oriented x3, affect grossly appropriate.  Prior Cardiac Testing/Procedures 1. NM Stress test 10/24/2013 IMPRESSION: 1. No clear evidence of scar or ischemia. 2. Normal wall motion by gated imaging. 3. Left ventricular ejection fraction 46% although appears normal by visual inspection. 4. Low risk stress test findings*.  Echocardiogram 10/24/2013 Left ventricle: The cavity size was  normal. Wall thickness was increased in a pattern of severe LVH. Systolic function was normal. The estimated ejection fraction was in the range of 60% to 65%. Wall motion was normal; there were no regional wall motion abnormalities. Features are consistent with a pseudonormal left ventricular filling pattern, with concomitant abnormal relaxation and increased filling pressure (grade 2 diastolic dysfunction). Doppler parameters are consistent with elevated ventricular end-diastolic filling pressure. - Aortic valve: Moderately calcified annulus. Trileaflet; mildly calcified leaflets. There was no significant regurgitation. Peak gradient (S): 9 mm Hg. - Mitral valve: Calcified annulus. Mildly thickened leaflets . There was mild regurgitation. - Left atrium: The atrium was severely dilated. - Right ventricle: Pacer wire or catheter noted in right ventricle. - Right atrium: The atrium was mildly dilated. Pacer wire or catheter noted in right atrium. Central venous pressure (est): 3 mm Hg. - Tricuspid valve: There was mild regurgitation. - Pulmonary  arteries: PA peak pressure: 35 mm Hg (S). - Pericardium, extracardiac: A prominent pericardial fat pad was present. A moderate pericardial effusion was identified posterior to the heart and along the right atrial free wall.   Lab Results  Basic Metabolic Panel:  Recent Labs Lab 10/13/14 1159  NA 136  K 3.6  CL 101  CO2 27  GLUCOSE 162*  BUN 13  CREATININE 1.00  CALCIUM 9.3    CBC:  Recent Labs Lab 10/13/14 1159  WBC 10.8*  HGB 15.1*  HCT 43.6  MCV 95.4  PLT 227    Cardiac Enzymes:  Recent Labs Lab 10/13/14 1159  [place] <0.03    Radiology: Dg Chest Portable 1 View  10/13/2014   CLINICAL DATA:  Sudden onset mid chest pain 30 min ago. History of hypertension.  EXAM: PORTABLE CHEST - 1 VIEW  COMPARISON:  06/28/2012  FINDINGS: Left-sided AICD leads to the right atrium and right ventricle. The heart is markedly enlarged and probably stable accounting for differences in patient position. There is mild pulmonary vascular congestion but no overt edema. No focal consolidations or pleural effusions.  IMPRESSION: 1. Cardiomegaly. 2. Pulmonary vascular congestion.   Electronically Signed   By: Nolon Nations M.D.   On: 10/13/2014 12:35    Impression and Recommendations  1. Unstable Angina:  Sudden onset at rest today, typical and atypical features. She states it began around 11:15 a.m. and has been constant. Initial troponin I is negative, ECG does show some progressive ST segment abnormalities compared to previous tracings. Most recent stress test one year ago was negative for ischemia. At this point plan is to transfer her to the step down unit at [facility] for further management. Hold Coumadin in anticipation of a diagnostic cardiac catheterization. Consider starting heparin drip once INR is less than 2.0, particularly if subsequent cardiac enzymes are abnormal.  2. Atrial fibrillation:  Continue digoxin, nebivolol. Coumadin will be held and heparin started as  discussed above once labs are available.   3. PPM in situ: Medtronic with ventricular pacing. Interrogated on 10/03/2014. Functioning appropriately. Followed by Dr. Lovena Le.  4. Fatigue: States she had mechanical fall and broke her pelvis in March. She has been relatively sedentary and dyspneic with exertion since that time.   Signed: Phill Myron. Lawrence NP [place]  10/13/2014, 1:07 PM Co-Sign MD   Attending note:  Patient seen and examined. Reviewed records and modified above note by Ms. Lawrence NP. Ms. Droge presents to the ER after developing sudden onset severe chest tightness associated with shortness of breath and weakness late this morning.  She was at her chiropractor getting some type of treatment on her feet, had completed it, and then the symptoms began. She states that she has been in her usual state of health until today, no recent chest pain. Has been less active and short of breath with activity since having a fall with pelvic fracture in March. She reports compliance with her medications, no specific palpitations, although I do see that she has had increased atrial fibrillation burden by pacemaker follow-up interrogation. She has sick sinus syndrome with a Medtronic device in place, followed by Dr. Lovena Le. Her Coumadin is followed by Dr. Cindie Laroche. Patient continues to have chest discomfort in the ER, although getting somewhat better after seating analgesia and nitroglycerin. Her ECG shows progressive ST segment depression and T-wave inversions compared to previous tracings that are suggestive of ischemia, otherwise ventricular paced beats and atrial fibrillation at baseline. Initial troponin I level is negative and chest x-ray shows mild pulmonary vascular congestion. On examination she appears mildly uncomfortable, lungs exhibit decreased breath sounds but no rales, cardiac exam with irregularly irregular rhythm and no S3. Additional lab work reviewed with WBC 10.8, glucose 162. Discussion  had with patient, daughter-in-law, and sister in room. Plan is to transfer her to the step down unit at [facility] with symptoms concerning for sudden onset unstable angina in anticipation of a diagnostic cardiac catheterization. Her Coumadin will be held, INR is pending. Consider starting heparin infusion once INR is less than 2.0, particularly if subsequent cardiac enzymes are abnormal.  Satira Sark, M.D., F.A.C.C.

## 2014-10-13 NOTE — ED Notes (Signed)
Chest pain for 15 minutes.  No other symptoms

## 2014-10-13 NOTE — ED Notes (Signed)
Patient reports chest tightness and chest pain has resolved.

## 2014-10-13 NOTE — Progress Notes (Addendum)
ANTICOAGULATION CONSULT NOTE - Initial Consult  Pharmacy Consult for heparin Indication: chest pain/ACS  Allergies  Allergen Reactions  . Morphine Nausea Only  . Penicillins Other (See Comments)    Has patient had a PCN reaction causing immediate rash, facial/tongue/throat swelling, SOB or lightheadedness with hypotension: NO Has patient had a PCN reaction causing severe rash involving mucus membranes or skin necrosis: no Has patient had a PCN reaction that required hospitalization: NO Has patient had a PCN reaction occurring within the last 10 years: NO If all of the above answers are "NO", then may proceed with Cephalosporin use.     Patient Measurements: Height: 5' 4.5" (163.8 cm) Weight: 152 lb (68.947 kg) IBW/kg (Calculated) : 55.85  Vital Signs: Temp: 98.1 F (36.7 C) (09/16 1420) Temp Source: Oral (09/16 1152) BP: 140/79 mmHg (09/16 1540) Pulse Rate: 63 (09/16 1540)  Labs:  Recent Labs  10/13/14 1159  HGB 15.1*  HCT 43.6  PLT 227  CREATININE 1.00  TROPONINI <0.03    Estimated Creatinine Clearance: 44.7 mL/min (by C-G formula based on Cr of 1).   Medical History: Past Medical History  Diagnosis Date  . Sick sinus syndrome     Medtronic PPM  . Chronic anticoagulation     Followed by PMD  . Hyperlipidemia   . Essential hypertension   . Carotid artery disease     Right carotid endarectomy 1999  . History of pneumonia     2010  . Glucose intolerance (impaired glucose tolerance)   . Coronary atherosclerosis     Minor at cardiac catheterization 2005  . Arthritis   . History of kidney stones   . Atrial fibrillation     Medications:  Prescriptions prior to admission  Medication Sig Dispense Refill Last Dose  . amLODipine (NORVASC) 5 MG tablet Take 1 tablet (5 mg total) by mouth daily. 30 tablet 3 10/12/2014 at Unknown time  . Ascorbic Acid (VITAMIN C) 1000 MG tablet Take 1,000 mg by mouth daily.    10/12/2014 at Unknown time  . BEE POLLEN PO Take 1  tablet by mouth daily.    10/12/2014 at Unknown time  . Biotin 2500 MCG CAPS Take 1 capsule by mouth daily.   10/12/2014 at Unknown time  . Calcium Carbonate-Vit D-Min (CALCIUM 1200 PO) Take 1 capsule by mouth 2 (two) times daily.    10/12/2014 at Unknown time  . Cholecalciferol (CVS D3) 5000 UNITS capsule Take 5,000 Units by mouth daily.   10/12/2014 at Unknown time  . clonazePAM (KLONOPIN) 1 MG tablet TAKE 1 TABLET BY MOUTH AT BEDTIME AS NEEDED 30 tablet 2 10/12/2014 at Unknown time  . digoxin (LANOXIN) 0.125 MG tablet Take 1 tablet (125 mcg total) by mouth every morning. 90 tablet 3 10/12/2014 at Unknown time  . glucosamine-chondroitin 500-400 MG tablet Take 2 tablets by mouth daily.    10/12/2014 at Unknown time  . Lactobacillus (ACIDOPHILUS PO) Take 2 tablets by mouth daily.   10/12/2014 at Unknown time  . LECITHIN PO Take 1 tablet by mouth daily.    10/12/2014 at Unknown time  . losartan (COZAAR) 50 MG tablet Take 1 tablet by mouth daily.  2 10/13/2014 at Unknown time  . MAGNESIUM-POTASSIUM PO Take 1 tablet by mouth daily.   10/12/2014 at Unknown time  . nebivolol (BYSTOLIC) 10 MG tablet Take 10 mg by mouth every morning.   10/13/2014 at Unknown time  . niacin (NIASPAN) 500 MG CR tablet Take 500 mg by mouth at bedtime.  10/12/2014 at Unknown time  . Omega-3 Fatty Acids (FISH OIL) 1000 MG CAPS Take 1 capsule by mouth daily.   10/12/2014 at Unknown time  . pyridoxine (B-6) 100 MG tablet Take 100 mg by mouth daily.    10/12/2014 at Unknown time  . TRINTELLIX 5 MG TABS Take 1 tablet by mouth daily.  1 10/12/2014 at Unknown time  . vitamin B-12 (CYANOCOBALAMIN) 250 MCG tablet Take 250 mcg by mouth daily.   10/12/2014 at Unknown time  . warfarin (COUMADIN) 5 MG tablet Take 2.5 mg by mouth daily.    10/13/2014 at 0800  . Blood Glucose Monitoring Suppl (BLOOD GLUCOSE METER KIT AND SUPPLIES) Dispense based on patient and insurance preference. Use to monitor FSBS one time daily as directed. DX: 250.00 1 each 0 Taking     Assessment: 80 y/o female who presented to [facility] ED with sudden onset of substernal chest pain associated with dyspnea and weakness. She takes chronic warfarin for Afib. Pharmacy consulted to begin IV heparin when INR <2 in preparation for cath. No bleeding noted, CBC is normal. PT/INR pending.  Goal of Therapy:  Heparin level 0.3-0.7 units/ml Monitor platelets by anticoagulation protocol: Yes   Plan:  - Follow-up PT/INR, heparin drip once INR <2  [facility], Pharm.D., BCPS Clinical Pharmacist Pager: [phone] 10/13/2014 4:09 PM   Addendum: INR is 1.51 so will begin heparin drip. Troponin <0.03 then 0.03.  Heparin 2000 units IV bolus then 850 units/hr 8 hr heparin level Daily heparin level and CBC Monitor for s/sx of bleeding  [facility], Pharm.D., BCPS Clinical Pharmacist Pager: [phone] 10/13/2014 5:59 PM   ADDN: Initial HL is subtherapeutic at 0.2 on heparin 850 units/hr. Nurse reports no issues with infusion or bleeding.  Plan: Increase heparin to 1100 units/hr 8h HL  Andrey Cota. Diona Foley, PharmD Clinical Pharmacist Pager [phone]

## 2014-10-13 NOTE — ED Notes (Signed)
RN to call back for report 

## 2014-10-13 NOTE — ED Notes (Signed)
Report given To Naval architect at [facility].

## 2014-10-13 NOTE — ED Provider Notes (Signed)
CSN: [id number]     Arrival date & time 10/13/14  1147 History  This chart was scribed for Tanna Furry, MD by Starleen Arms, ED Scribe. This patient was seen in room APA10/APA10 and the patient's care was started at 11:54 AM.   Chief Complaint  Patient presents with  . Chest Pain     The history is provided by the patient. No language interpreter was used.   HPI Comments: Traci [name] is a 80 y.o. female with hx of Afib (coumadin, implanted pacemaker) who presents to the Emergency Department complaining of central, constant, unchanged chest tightness radiating into the neck onset 25 minutes ago.  Immediately prior to onset the patient reports she was sitting, having unknown "pad" placed on her feet that was intended to treat her chronic foot pain.  Associated symptoms include moderate SOB.  She denies hx of prior episodesShe dose not recall having a prior angiogram or cardiac stress test.  She denies hx of HTN, HLD, DM, smoking.  She denies nausea, hemoptysis, blood in stools, hematuria, extremity swelling.    Cardiologist: Dr. Lovena Le   Past Medical History  Diagnosis Date  . Sick sinus syndrome     [employer] PPM  . Chronic anticoagulation     Followed by PMD  . Hyperlipidemia   . Essential hypertension   . Carotid artery disease     Right carotid endarectomy 1999  . History of pneumonia     2010  . Glucose intolerance (impaired glucose tolerance)   . Coronary atherosclerosis     Minor at cardiac catheterization 2005  . Arthritis   . History of kidney stones   . Atrial fibrillation    Past Surgical History  Procedure Laterality Date  . Appendectomy    . Abdominal hysterectomy    . Carotid endarectomy Right     1999  . Pacemaker placement      2006  . Colonoscopy      2006  . Cystoscopy w/ ureteral stent placement Left 04/19/2012    Procedure: CYSTOSCOPY WITH RETROGRADE PYELOGRAM/URETERAL STENT PLACEMENT ;  Surgeon: Ailene Rud, MD;  Location: [facility];  Service:  Urology;  Laterality: Left;  . Breast biopsy Bilateral     x 7 total  . Eye surgery Bilateral     Cataract extraction with IOL  . Cystoscopy with retrograde pyelogram, ureteroscopy and stent placement Left 06/30/2012    Procedure: CYSTOSCOPY WITH LEFT  RETROGRADE PYELOGRAM, URETEROSCOPY  with basketing of stone, AND STENT PLACEMENT, TRANSURETHRAL UNROOFING OF URETER.;  Surgeon: Alexis Frock, MD;  Location: [facility];  Service: Urology;  Laterality: Left;   Family History  Problem Relation Age of Onset  . Heart attack Mother     Died age 67  . Hyperlipidemia Mother   . Diabetes Mother   . Coronary artery disease Father   . Alcohol abuse Father   . Hyperlipidemia Father   . Diabetes Sister   . Hypertension Sister   . Breast cancer Sister   . Breast cancer Sister    Social History  Substance Use Topics  . Smoking status: Never Smoker   . Smokeless tobacco: Never Used  . Alcohol Use: No   OB History    No data available     Review of Systems  Constitutional: Negative for chills and appetite change.  HENT: Negative for mouth sores and sore throat.   Eyes: Negative for visual disturbance.  Respiratory: Positive for shortness of breath. Negative for chest tightness and  wheezing.   Cardiovascular: Positive for chest pain.  Gastrointestinal: Negative for diarrhea and abdominal distention.  Endocrine: Negative for polydipsia, polyphagia and polyuria.  Genitourinary: Negative for dysuria, frequency and hematuria.  Musculoskeletal: Positive for neck pain. Negative for gait problem.  Skin: Negative for color change, pallor and rash.  Neurological: Negative for syncope and light-headedness.  Hematological: Bruises/bleeds easily.  Psychiatric/Behavioral: Negative for behavioral problems and confusion. The patient is nervous/anxious.    Allergies  Morphine and Penicillins  Home Medications   Prior to Admission medications   Medication Sig Start Date End Date Taking? Authorizing  Provider  acetaminophen-codeine (TYLENOL #3) 300-30 MG per tablet Take 1 tablet by mouth every 6 (six) hours as needed for moderate pain. 03/20/14   Carole Civil, MD  amLODipine (NORVASC) 5 MG tablet Take 1 tablet (5 mg total) by mouth daily. 11/08/13   Herminio Commons, MD  Ascorbic Acid (VITAMIN C) 1000 MG tablet Take 1,000 mg by mouth daily.     Historical Provider, MD  BEE POLLEN PO Take by mouth.    Historical Provider, MD  Blood Glucose Monitoring Suppl (BLOOD GLUCOSE METER KIT AND SUPPLIES) Dispense based on patient and insurance preference. Use to monitor FSBS one time daily as directed. DX: 250.00 09/05/13   Alycia Rossetti, MD  Calcium Carbonate-Vit D-Min (CALCIUM 1200 PO) Take 1 capsule by mouth 2 (two) times daily.     Historical Provider, MD  Cholecalciferol (CVS D3) 5000 UNITS capsule Take 5,000 Units by mouth daily.    Historical Provider, MD  clonazePAM (KLONOPIN) 1 MG tablet TAKE 1 TABLET BY MOUTH AT BEDTIME AS NEEDED 06/17/13   Alycia Rossetti, MD  digoxin (LANOXIN) 0.125 MG tablet Take 1 tablet (125 mcg total) by mouth every morning. 06/06/13   Alycia Rossetti, MD  docusate sodium (COLACE) 100 MG capsule Take 1 capsule (100 mg total) by mouth 2 (two) times daily. 03/20/14   Carole Civil, MD  fesoterodine (TOVIAZ) 4 MG TB24 tablet Take 4 mg by mouth daily.    Historical Provider, MD  glucosamine-chondroitin 500-400 MG tablet Take 1 tablet by mouth 3 (three) times daily.    Historical Provider, MD  LECITHIN PO Take by mouth.    Historical Provider, MD  losartan (COZAAR) 100 MG tablet Take 50 mg by mouth 2 (two) times daily.    Historical Provider, MD  nebivolol (BYSTOLIC) 10 MG tablet Take 10 mg by mouth every morning.    Historical Provider, MD  niacin (NIASPAN) 500 MG CR tablet Take 500 mg by mouth at bedtime.    Historical Provider, MD  pyridoxine (B-6) 100 MG tablet Take 100 mg by mouth as needed.     Historical Provider, MD  vitamin B-12 (CYANOCOBALAMIN) 250 MCG  tablet Take 250 mcg by mouth daily.    Historical Provider, MD  warfarin (COUMADIN) 5 MG tablet Take 2.22m (1/2 tablet) on M,T,,Th,F,Sat. Take 531m(1 tablet) on Sun.Wed 06/06/13   KaAlycia RossettiMD   BP 137/83 mmHg  Pulse 61  Temp(Src) 97.5 F (36.4 C) (Oral)  Resp 15  Ht 5' 4.5" (1.638 m)  Wt 152 lb (68.947 kg)  BMI 25.70 kg/m2  SpO2 96% Physical Exam  Constitutional: She is oriented to person, place, and time. She appears well-developed and well-nourished. No distress.  HENT:  Head: Normocephalic.  Eyes: Conjunctivae are normal. Pupils are equal, round, and reactive to light. No scleral icterus.  Neck: Normal range of motion. Neck supple. No thyromegaly  present.  Cardiovascular: Normal rate and regular rhythm.  Exam reveals no gallop and no friction rub.   No murmur heard. Pulmonary/Chest: Effort normal and breath sounds normal. No respiratory distress. She has no wheezes. She has no rales.  Abdominal: Soft. Bowel sounds are normal. She exhibits no distension. There is no tenderness. There is no rebound.  Musculoskeletal: Normal range of motion.  Neurological: She is alert and oriented to person, place, and time.  Skin: Skin is warm and dry. No rash noted.  Psychiatric: She has a normal mood and affect. Her behavior is normal.    ED Course  Procedures (including critical care time)  DIAGNOSTIC STUDIES: Oxygen Saturation is 98% on RA, normal by my interpretation.    COORDINATION OF CARE:  12:02 PM Will order labs, imaging, NTG, consult with cardiologist.  Patient acknowledges and agrees with plan.    Labs Review Labs Reviewed  BASIC METABOLIC PANEL - Abnormal; Notable for the following:    Glucose, Bld 162 (*)    GFR calc non Af Amer 53 (*)    All other components within normal limits  CBC - Abnormal; Notable for the following:    WBC 10.8 (*)    Hemoglobin 15.1 (*)    All other components within normal limits  TROPONIN I    Imaging Review Dg Chest Portable 1  View  10/13/2014   CLINICAL DATA:  Sudden onset mid chest pain 30 min ago. History of hypertension.  EXAM: PORTABLE CHEST - 1 VIEW  COMPARISON:  06/28/2012  FINDINGS: Left-sided AICD leads to the right atrium and right ventricle. The heart is markedly enlarged and probably stable accounting for differences in patient position. There is mild pulmonary vascular congestion but no overt edema. No focal consolidations or pleural effusions.  IMPRESSION: 1. Cardiomegaly. 2. Pulmonary vascular congestion.   Electronically Signed   By: Nolon Nations M.D.   On: 10/13/2014 12:35   I have personally reviewed and evaluated these images and lab results as part of my medical decision-making.   EKG Interpretation   Date/Time:  Friday October 13 2014 11:50:57 EDT Ventricular Rate:  82 PR Interval:    QRS Duration: 105 QT Interval:  390 QTC Calculation: 455 R Axis:   62 Text Interpretation:  Atrial fibrillation Repol abnrm, severe global  ischemia (LM/MVD) Baseline wander in lead(s) II III aVL aVF V5 Confirmed  by Jeneen Rinks  MD, [place] ([id number]) on 10/13/2014 11:53:34 AM      MDM   Final diagnoses:  Chest pain, unspecified chest pain type    CRITICAL CARE Performed by: Lolita Patella   Total critical care time: 38 minutes of NTG qtt, initial management, consultation  Critical care time was exclusive of separately billable procedures and treating other patients.  Critical care was necessary to treat or prevent imminent or life-threatening deterioration.  Critical care was time spent personally by me on the following activities: development of treatment plan with patient and/or surrogate as well as nursing, discussions with consultants, evaluation of patient's response to treatment, examination of patient, obtaining history from patient or surrogate, ordering and performing treatments and interventions, ordering and review of laboratory studies, ordering and review of radiographic studies, pulse  oximetry and re-evaluation of patient's condition. care  Patient's EKG obtained during my evaluation of her. Additional ST depressions and T-wave inversions inferiorly and laterally. Most recent comparison shows primary paced rhythm. Does have some prior EKG showing similar ST depressions. She is diaphoretic and complaining of tightness in her  chest rating to her neck sounds turning for cardiac etiology. I discussed the case with Dr. Domenic Polite. Toradol return my call immediately. We discussed the patient at length. In 05 she had a normal angiogram. Today no current medicine mild skin less than 2 years ago that did not show ischemia. She denies some nitroglycerin drip. Her symptoms have improved. After Domenic Polite has seen the patient emergency room is planning transfer to [facility] for further care.  I personally performed the services described in this documentation, which was scribed in my presence. The recorded information has been reviewed and is accurate.    Tanna Furry, MD 10/14/14 [phone]

## 2014-10-13 NOTE — ED Notes (Signed)
Dr Mcdowell at bedside 

## 2014-10-14 DIAGNOSIS — I482 Chronic atrial fibrillation: Principal | ICD-10-CM

## 2014-10-14 DIAGNOSIS — I25110 Atherosclerotic heart disease of native coronary artery with unstable angina pectoris: Secondary | ICD-10-CM

## 2014-10-14 DIAGNOSIS — I10 Essential (primary) hypertension: Secondary | ICD-10-CM

## 2014-10-14 DIAGNOSIS — I5032 Chronic diastolic (congestive) heart failure: Secondary | ICD-10-CM

## 2014-10-14 LAB — CBC
HEMATOCRIT: 40.2 % (ref 36.0–46.0)
Hemoglobin: 13.8 g/dL (ref 12.0–15.0)
MCH: 33 pg (ref 26.0–34.0)
MCHC: 34.3 g/dL (ref 30.0–36.0)
MCV: 96.2 fL (ref 78.0–100.0)
Platelets: 188 10*3/uL (ref 150–400)
RBC: 4.18 MIL/uL (ref 3.87–5.11)
RDW: 14.1 % (ref 11.5–15.5)
WBC: 10.5 10*3/uL (ref 4.0–10.5)

## 2014-10-14 LAB — HEPARIN LEVEL (UNFRACTIONATED)
HEPARIN UNFRACTIONATED: 0.2 [IU]/mL — AB (ref 0.30–0.70)
HEPARIN UNFRACTIONATED: 0.56 [IU]/mL (ref 0.30–0.70)
Heparin Unfractionated: 0.46 IU/mL (ref 0.30–0.70)

## 2014-10-14 LAB — HEMOGLOBIN A1C
Hgb A1c MFr Bld: 7.6 % — ABNORMAL HIGH (ref 4.8–5.6)
Mean Plasma Glucose: 171 mg/dL

## 2014-10-14 LAB — TROPONIN I: TROPONIN I: 0.03 ng/mL (ref <0.031)

## 2014-10-14 LAB — BRAIN NATRIURETIC PEPTIDE: B NATRIURETIC PEPTIDE 5: 281.7 pg/mL — AB (ref 0.0–100.0)

## 2014-10-14 NOTE — Progress Notes (Signed)
Patient Name: Traci [name] Date of Encounter: 10/14/2014    SUBJECTIVE: Traci [name] is a 80 y.o.female with history of paroxysmal atrial fibrillation, CHADSVASC Score of 4 on coumadin, PPM in the setting of SSS, hypertension, and minor CAD at cardiac catheterization 2005.  Patient does not remember ever having prior heart catheterization. She has never had chest discomfort similar to that on presentation yesterday. She feels well currently.  TELEMETRY:  Atrial fibrillation with controlled rate Filed Vitals:   10/14/14 0600 10/14/14 0700 10/14/14 0733 10/14/14 0800  BP: 154/77 163/89 163/89 167/83  Pulse: 64 62 60 65  Temp:   98.1 F (36.7 C)   TempSrc:   Oral   Resp: 18 12 13 14   Height:      Weight:      SpO2: 75% 96% 100% 97%    Intake/Output Summary (Last 24 hours) at 10/14/14 1038 Last data filed at 10/14/14 0800  Gross per 24 hour  Intake 165.72 ml  Output    250 ml  Net -84.28 ml   LABS: Basic Metabolic Panel:  Recent Labs  10/13/14 1159 10/13/14 1628  NA 136 141  K 3.6 4.1  CL 101 101  CO2 27 31  GLUCOSE 162* 91  BUN 13 10  CREATININE 1.00 1.00  CALCIUM 9.3 9.6   CBC:  Recent Labs  10/13/14 1159 10/14/14 0308  WBC 10.8* 10.5  HGB 15.1* 13.8  HCT 43.6 40.2  MCV 95.4 96.2  PLT 227 188   Cardiac Enzymes:  Recent Labs  10/13/14 1628 10/13/14 2157 10/14/14 0308  TROPONINI <0.03 0.03 0.03    BNP No results found for: BNP  ProBNP No results found for: PROBNP   Hemoglobin A1C:  Recent Labs  10/13/14 1628  HGBA1C 7.6*   Fasting Lipid Panel: No results for input(s): CHOL, HDL, LDLCALC, TRIG, CHOLHDL, LDLDIRECT in the last 72 hours.  Radiology/Studies:  Pulmonary vascular congestion on admitting chest x-ray  Electrocardiogram: Atrial fib with controlled rate. Intermittent ventricular pacing. Marked repolarization abnormality on non-paced beats. Could represent dig effect versus ischemia.  Physical Exam: Blood pressure  167/83, pulse 65, temperature 98.1 F (36.7 C), temperature source Oral, resp. rate 14, height 5' 4.5" (1.638 m), weight 68.947 kg (152 lb), SpO2 97 %. Weight change:   Wt Readings from Last 3 Encounters:  10/13/14 68.947 kg (152 lb)  04/20/14 67.132 kg (148 lb)  03/20/14 67.495 kg (148 lb 12.8 oz)   Patient is comfortable lying flat in bed. Neck veins nondistended Chest is clear Cardiac exam reveals an irregularly irregular rhythm without significant murmur No edema. 1+ bilateral radial pulses. Neuro exam is intact with the exception of some decrease in memory.  ASSESSMENT:  1. Acute coronary syndrome presenting as unstable angina. Cardiac markers are normal. Intrinsic QRS complexes reveals significant repolarization abnormality which we interpret is either ischemia related or digitalis effect 2. Pulmonary congestion on admission chest x-ray but with no clinical complaints of dyspnea/CHF. No clinical evidence of volume overload. 3. Chronic atrial fibrillation on chronic Coumadin anticoagulation. Coumadin is being held. IV heparin and his been started. 4. Hyperlipidemia 5. Essential hypertension 6. Carotid disease with prior right carotid endarterectomy 7. Sick sinus syndrome with DDD pacemaker present 8. Chronic diastolic heart failure with LVEF 55-60% 2015  Plan:  1.The patient was counseled to undergo heart catheterization which may include left heart, coronary angiography, and possible percutaneous coronary intervention with stent implantation. The procedural risks and benefits were discussed  in detail. The risks discussed included death, stroke, myocardial infarction, life-threatening bleeding, limb ischemia, kidney injury, allergy, and possible emergency cardiac surgery. The risk of these significant complications occurred less than 1% of the time. After discussion, the patient has agreed to proceed. 2. We'll get a 2-D echo to assess LV systolic function. 3. Nothing by mouth after  midnight tomorrow in preparation for coronary angiography on Monday. I will place name on the board.  Demetrios Isaacs 10/14/2014, 10:38 AM

## 2014-10-14 NOTE — Progress Notes (Addendum)
ANTICOAGULATION CONSULT NOTE - Follow Up Consult  Pharmacy Consult for heparin Indication: chest pain/ACS  Allergies  Allergen Reactions  . Morphine Nausea Only  . Penicillins Other (See Comments)    Has patient had a PCN reaction causing immediate rash, facial/tongue/throat swelling, SOB or lightheadedness with hypotension: NO Has patient had a PCN reaction causing severe rash involving mucus membranes or skin necrosis: no Has patient had a PCN reaction that required hospitalization: NO Has patient had a PCN reaction occurring within the last 10 years: NO If all of the above answers are "NO", then may proceed with Cephalosporin use.     Patient Measurements: Height: 5' 4.5" (163.8 cm) Weight: 152 lb (68.947 kg) IBW/kg (Calculated) : 55.85  Vital Signs: Temp: 98.6 F (37 C) (09/17 1227) Temp Source: Oral (09/17 1227) BP: 155/85 mmHg (09/17 1227) Pulse Rate: 77 (09/17 1227)  Labs:  Recent Labs  10/13/14 1159 10/13/14 1628 10/13/14 2157 10/14/14 0308 10/14/14 1241  HGB 15.1*  --   --  13.8  --   HCT 43.6  --   --  40.2  --   PLT 227  --   --  188  --   LABPROT  --  18.3*  --   --   --   INR  --  1.51*  --   --   --   HEPARINUNFRC  --   --   --  0.20* 0.46  CREATININE 1.00 1.00  --   --   --   TROPONINI <0.03 <0.03 0.03 0.03  --     Estimated Creatinine Clearance: 44.7 mL/min (by C-G formula based on Cr of 1).   Assessment: 80 yo f admitted with CP.  Pharmacy is consulted to dose heparin.  HL this afternoon is therapeutic at 0.46 on 1100 units/hr.  CBC stable, no issues per RN.  Goal of Therapy:  Heparin level 0.3-0.7 units/ml Monitor platelets by anticoagulation protocol: Yes   Plan:  Continue heparin at 1100 units/hr F/u confirmatory 8-hr HL @ 2100 Monitor s/s of bleeding  Cassie L. Nicole Kindred, PharmD Clinical Pharmacy Resident Pager: [phone] 10/14/2014 1:56 PM   ADDN: Confirmatory HL remains therapeutic at 0.56 on heparin 1100 units/hr. No issues  with infusion and bleeding noted.  Plan: Continue heparin 1100 units/hr Daily HL/CBC  Andrey Cota. Diona Foley, PharmD Clinical Pharmacist Pager [phone]

## 2014-10-15 DIAGNOSIS — I2510 Atherosclerotic heart disease of native coronary artery without angina pectoris: Secondary | ICD-10-CM

## 2014-10-15 LAB — CBC
HCT: 40 % (ref 36.0–46.0)
Hemoglobin: 13.5 g/dL (ref 12.0–15.0)
MCH: 32.8 pg (ref 26.0–34.0)
MCHC: 33.8 g/dL (ref 30.0–36.0)
MCV: 97.1 fL (ref 78.0–100.0)
PLATELETS: 191 10*3/uL (ref 150–400)
RBC: 4.12 MIL/uL (ref 3.87–5.11)
RDW: 14 % (ref 11.5–15.5)
WBC: 9.3 10*3/uL (ref 4.0–10.5)

## 2014-10-15 LAB — HEPARIN LEVEL (UNFRACTIONATED): HEPARIN UNFRACTIONATED: 0.55 [IU]/mL (ref 0.30–0.70)

## 2014-10-15 NOTE — Progress Notes (Addendum)
       Patient Name: Traci [name] Date of Encounter: 10/15/2014    SUBJECTIVE: Ms. Lindsley is a 80 y.o.female with history of paroxysmal atrial fibrillation, CHADSVASC Score of 4 on coumadin, PPM in the setting of SSS, hypertension, and minor CAD at cardiac catheterization 2005.  No chest pain or complaints.  TELEMETRY:  Atrial fibrillation with controlled rate Filed Vitals:   10/15/14 0007 10/15/14 0404 10/15/14 0415 10/15/14 0729  BP: 191/95  165/82 154/72  Pulse: 75 83 73 58  Temp: 97.6 F (36.4 C) 97.9 F (36.6 C)  98 F (36.7 C)  TempSrc: Oral Oral  Oral  Resp: 16 16  16   Height:      Weight:      SpO2: 99% 95% 90% 95%    Intake/Output Summary (Last 24 hours) at 10/15/14 0929 Last data filed at 10/15/14 0600  Gross per 24 hour  Intake  742.5 ml  Output      0 ml  Net  742.5 ml   LABS: Basic Metabolic Panel:  Recent Labs  10/13/14 1159 10/13/14 1628  NA 136 141  K 3.6 4.1  CL 101 101  CO2 27 31  GLUCOSE 162* 91  BUN 13 10  CREATININE 1.00 1.00  CALCIUM 9.3 9.6   CBC:  Recent Labs  10/14/14 0308 10/15/14 0238  WBC 10.5 9.3  HGB 13.8 13.5  HCT 40.2 40.0  MCV 96.2 97.1  PLT 188 191   Cardiac Enzymes:  Recent Labs  10/13/14 1628 10/13/14 2157 10/14/14 0308  TROPONINI <0.03 0.03 0.03    BNP    Component Value Date/Time   BNP 281.7* 10/14/2014 1244    ProBNP No results found for: PROBNP   Hemoglobin A1C:  Recent Labs  10/13/14 1628  HGBA1C 7.6*   Fasting Lipid Panel: No results for input(s): CHOL, HDL, LDLCALC, TRIG, CHOLHDL, LDLDIRECT in the last 72 hours.  Radiology/Studies:  Pulmonary vascular congestion on admitting chest x-ray  Electrocardiogram: Atrial fib with controlled rate. Intermittent ventricular pacing. Marked repolarization abnormality on non-paced beats. Could represent dig effect versus ischemia.  Physical Exam: Blood pressure 154/72, pulse 58, temperature 98 F (36.7 C), temperature source Oral, resp.  rate 16, height 5' 4.5" (1.638 m), weight 68.947 kg (152 lb), SpO2 95 %. Weight change:   Wt Readings from Last 3 Encounters:  10/13/14 68.947 kg (152 lb)  04/20/14 67.132 kg (148 lb)  03/20/14 67.495 kg (148 lb 12.8 oz)   Difficult to awaken from sleep. Patient is comfortable lying flat in bed. Neck veins non-distended Chest is clear Cardiac exam reveals an irregularly irregular rhythm without significant murmur No edema. 1+ bilateral radial pulses  ASSESSMENT:  1. Acute coronary syndrome, with no recurrent pain.  2. Pulmonary congestion on admission chest x-ray but denies dyspnea and BNP < 250  3. Chronic atrial fibrillation on IV heparin.  4. Essential hypertension, with elevated systolic  5. Sick sinus syndrome with DDD pacemaker present  6. Chronic diastolic heart failure with LVEF 55-60% 2015  Plan:  1. Orders written for cath. Name on add on board 2. Continue IV heparin and NTG  Signed, Sinclair Grooms 10/15/2014, 9:29 AM

## 2014-10-15 NOTE — Progress Notes (Signed)
Utilization Review Completed.Dowell, Deborah T9/18/2016  

## 2014-10-15 NOTE — Progress Notes (Signed)
ANTICOAGULATION CONSULT NOTE - Follow Up Consult  Pharmacy Consult for heparin Indication: chest pain/ACS  Allergies  Allergen Reactions  . Morphine Nausea Only  . Penicillins Other (See Comments)    Has patient had a PCN reaction causing immediate rash, facial/tongue/throat swelling, SOB or lightheadedness with hypotension: NO Has patient had a PCN reaction causing severe rash involving mucus membranes or skin necrosis: no Has patient had a PCN reaction that required hospitalization: NO Has patient had a PCN reaction occurring within the last 10 years: NO If all of the above answers are "NO", then may proceed with Cephalosporin use.     Patient Measurements: Height: 5' 4.5" (163.8 cm) Weight: 152 lb (68.947 kg) IBW/kg (Calculated) : 55.85  Vital Signs: Temp: 98 F (36.7 C) (09/18 0729) Temp Source: Oral (09/18 0729) BP: 154/72 mmHg (09/18 0729) Pulse Rate: 58 (09/18 0729)  Labs:  Recent Labs  10/13/14 1159 10/13/14 1628 10/13/14 2157  10/14/14 0308 10/14/14 1241 10/14/14 2102 10/15/14 0238  HGB 15.1*  --   --   --  13.8  --   --  13.5  HCT 43.6  --   --   --  40.2  --   --  40.0  PLT 227  --   --   --  188  --   --  191  LABPROT  --  18.3*  --   --   --   --   --   --   INR  --  1.51*  --   --   --   --   --   --   HEPARINUNFRC  --   --   --   < > 0.20* 0.46 0.56 0.55  CREATININE 1.00 1.00  --   --   --   --   --   --   TROPONINI <0.03 <0.03 0.03  --  0.03  --   --   --   < > = values in this interval not displayed.  Assessment: 80 yo f admitted with CP. Pharmacy is dosing heparin for preparation of cath tomorrow 9/19. HL has been therapeutic x 3 (0.46, 0.56, 0.55) on 1100 units/hr.  CBC stable, no issues per RN or family.  Goal of Therapy:  Heparin level 0.3-0.7 units/ml Monitor platelets by anticoagulation protocol: Yes   Plan:  Continue heparin infusion at 1100 units/hr Cath tomorrow Monitor for bleeding  Cassie L. Nicole Kindred, PharmD Clinical Pharmacy  Resident Pager: [phone] 10/15/2014 8:51 AM

## 2014-10-16 ENCOUNTER — Encounter (HOSPITAL_COMMUNITY): Payer: Self-pay | Admitting: Cardiology

## 2014-10-16 ENCOUNTER — Encounter (HOSPITAL_COMMUNITY)
Admission: EM | Disposition: A | Discharge status: Home / Self Care | Payer: Self-pay | Source: Home / Self Care | Attending: Internal Medicine

## 2014-10-16 HISTORY — PX: CARDIAC CATHETERIZATION: SHX172

## 2014-10-16 LAB — PROTIME-INR
INR: 1.24 (ref 0.00–1.49)
INR: 1.31 (ref 0.00–1.49)
PROTHROMBIN TIME: 16.4 s — AB (ref 11.6–15.2)
Prothrombin Time: 15.8 seconds — ABNORMAL HIGH (ref 11.6–15.2)

## 2014-10-16 LAB — CBC
HCT: 41.6 % (ref 36.0–46.0)
Hemoglobin: 13.8 g/dL (ref 12.0–15.0)
MCH: 32.1 pg (ref 26.0–34.0)
MCHC: 33.2 g/dL (ref 30.0–36.0)
MCV: 96.7 fL (ref 78.0–100.0)
PLATELETS: 208 10*3/uL (ref 150–400)
RBC: 4.3 MIL/uL (ref 3.87–5.11)
RDW: 14.2 % (ref 11.5–15.5)
WBC: 9.4 10*3/uL (ref 4.0–10.5)

## 2014-10-16 LAB — POCT ACTIVATED CLOTTING TIME: ACTIVATED CLOTTING TIME: 134 s

## 2014-10-16 LAB — HEPARIN LEVEL (UNFRACTIONATED): HEPARIN UNFRACTIONATED: 0.51 [IU]/mL (ref 0.30–0.70)

## 2014-10-16 SURGERY — LEFT HEART CATH AND CORONARY ANGIOGRAPHY
Anesthesia: LOCAL

## 2014-10-16 MED ORDER — SODIUM CHLORIDE 0.9 % IJ SOLN: 3.0000 mL | INTRAMUSCULAR | Status: DC | PRN

## 2014-10-16 MED ORDER — SODIUM CHLORIDE 0.9 % IV SOLN: 250.0000 mL | INTRAVENOUS | Status: DC | PRN

## 2014-10-16 MED ORDER — SODIUM CHLORIDE 0.9 % WEIGHT BASED INFUSION
1.0000 mL/kg/h | INTRAVENOUS | Status: DC
  Administered 2014-10-16: 1 mL/kg/h via INTRAVENOUS

## 2014-10-16 MED ORDER — ASPIRIN 81 MG PO CHEW: 81.0000 mg | CHEWABLE_TABLET | ORAL | Status: DC

## 2014-10-16 MED ORDER — LIDOCAINE HCL (PF) 1 % IJ SOLN
INTRAMUSCULAR | Status: AC
Start: 2014-10-16 — End: 2014-10-16
  Filled 2014-10-16: qty 30

## 2014-10-16 MED ORDER — SODIUM CHLORIDE 0.9 % IJ SOLN
3.0000 mL | Freq: Two times a day (BID) | INTRAMUSCULAR | Status: DC
  Administered 2014-10-16: 3 mL via INTRAVENOUS

## 2014-10-16 MED ORDER — MIDAZOLAM HCL 2 MG/2ML IJ SOLN
INTRAMUSCULAR | Status: DC | PRN
  Administered 2014-10-16 (×2): 1 mg via INTRAVENOUS

## 2014-10-16 MED ORDER — HEPARIN (PORCINE) IN NACL 100-0.45 UNIT/ML-% IJ SOLN
1100.0000 [IU]/h | INTRAMUSCULAR | Status: DC
  Administered 2014-10-16: 1100 [IU]/h via INTRAVENOUS
  Filled 2014-10-16: qty 250

## 2014-10-16 MED ORDER — SODIUM CHLORIDE 0.9 % IJ SOLN
3.0000 mL | Freq: Two times a day (BID) | INTRAMUSCULAR | Status: DC
  Administered 2014-10-17: 3 mL via INTRAVENOUS

## 2014-10-16 MED ORDER — FENTANYL CITRATE (PF) 100 MCG/2ML IJ SOLN
INTRAMUSCULAR | Status: DC | PRN
  Administered 2014-10-16 (×2): 25 ug via INTRAVENOUS

## 2014-10-16 MED ORDER — WARFARIN SODIUM 5 MG PO TABS
5.0000 mg | ORAL_TABLET | Freq: Once | ORAL | Status: AC
  Administered 2014-10-16: 5 mg via ORAL
  Filled 2014-10-16: qty 1

## 2014-10-16 MED ORDER — SODIUM CHLORIDE 0.9 % WEIGHT BASED INFUSION: 3.0000 mL/kg/h | INTRAVENOUS | Status: AC

## 2014-10-16 MED ORDER — NITROGLYCERIN 1 MG/10 ML FOR IR/CATH LAB
INTRA_ARTERIAL | Status: AC
Start: 2014-10-16 — End: 2014-10-16
  Filled 2014-10-16: qty 10

## 2014-10-16 MED ORDER — IOHEXOL 350 MG/ML SOLN
INTRAVENOUS | Status: DC | PRN
  Administered 2014-10-16: 155 mL via INTRACARDIAC

## 2014-10-16 MED ORDER — HEPARIN (PORCINE) IN NACL 2-0.9 UNIT/ML-% IJ SOLN
INTRAMUSCULAR | Status: AC
  Filled 2014-10-16: qty 1000

## 2014-10-16 MED ORDER — WARFARIN - PHARMACIST DOSING INPATIENT
Freq: Every day | Status: DC
  Administered 2014-10-16: 20:00:00

## 2014-10-16 MED ORDER — FENTANYL CITRATE (PF) 100 MCG/2ML IJ SOLN
INTRAMUSCULAR | Status: AC
  Filled 2014-10-16: qty 4

## 2014-10-16 MED ORDER — NITROGLYCERIN 1 MG/10 ML FOR IR/CATH LAB
INTRA_ARTERIAL | Status: DC | PRN
  Administered 2014-10-16: 14:00:00

## 2014-10-16 MED ORDER — MIDAZOLAM HCL 2 MG/2ML IJ SOLN
INTRAMUSCULAR | Status: AC
  Filled 2014-10-16: qty 4

## 2014-10-16 MED ORDER — SODIUM CHLORIDE 0.9 % IV SOLN
250.0000 mL | INTRAVENOUS | Status: DC | PRN
Start: 2014-10-16 — End: 2014-10-17

## 2014-10-16 MED ORDER — LIDOCAINE HCL (PF) 1 % IJ SOLN
INTRAMUSCULAR | Status: DC | PRN
  Administered 2014-10-16: 15 mL via SUBCUTANEOUS
  Administered 2014-10-16: 5 mL via SUBCUTANEOUS

## 2014-10-16 SURGICAL SUPPLY — 12 items
CATH INFINITI 5 FR JL3.5 (CATHETERS) ×1 IMPLANT
CATH INFINITI 5FR AL1 (CATHETERS) ×1 IMPLANT
CATH INFINITI 5FR MULTPACK ANG (CATHETERS) ×1 IMPLANT
DEVICE RAD COMP TR BAND LRG (VASCULAR PRODUCTS) ×2 IMPLANT
GLIDESHEATH SLEND A-KIT 6F 22G (SHEATH) ×2 IMPLANT
KIT HEART LEFT (KITS) ×2 IMPLANT
PACK CARDIAC CATHETERIZATION (CUSTOM PROCEDURE TRAY) ×2 IMPLANT
SHEATH PINNACLE 5F 10CM (SHEATH) ×1 IMPLANT
TRANSDUCER W/STOPCOCK (MISCELLANEOUS) ×2 IMPLANT
TUBING CIL FLEX 10 FLL-RA (TUBING) ×2 IMPLANT
WIRE EMERALD 3MM-J .035X150CM (WIRE) ×1 IMPLANT
WIRE SAFE-T 1.5MM-J .035X260CM (WIRE) ×2 IMPLANT

## 2014-10-16 NOTE — Progress Notes (Signed)
TR BAND REMOVAL  LOCATION:   right radial  DEFLATED PER PROTOCOL:    Yes.    TIME BAND OFF / DRESSING APPLIED:    1420p   SITE UPON ARRIVAL:    Level 0  SITE AFTER BAND REMOVAL:    Level 0  REVERSE ALLEN'S TEST:     positive  CIRCULATION SENSATION AND MOVEMENT:    Within Normal Limits   Yes.    COMMENTS:   No complaints of discomfort at site

## 2014-10-16 NOTE — H&P (View-Only) (Signed)
Patient Name: Traci [name] Date of Encounter: 10/16/2014  Active Problems:   Atrial fibrillation   Encounter for monitoring coumadin therapy   CAD (coronary artery disease)   Essential hypertension, benign   Unstable angina   Chronic diastolic heart failure   Length of Stay: 3  SUBJECTIVE  No further angina or dyspnea. Symptoms on presentation were consistent with unstable angina. Remains in AF with intermittent pacing. ST changes are very prominent, but are readily explained by severe LVH documented on Echo and seen on ECG in 2014 (intervening ECGs were V paced) Normal cardiac enzymes.  CURRENT MEDS . amLODipine  5 mg Oral Daily  . aspirin EC  81 mg Oral Daily  . calcium-vitamin D  1 tablet Oral BID  . cholecalciferol  5,000 Units Oral Daily  . digoxin  125 mcg Oral q morning - 10a  . docusate sodium  100 mg Oral BID  . fesoterodine  4 mg Oral Daily  . losartan  50 mg Oral BID  . nebivolol  10 mg Oral q morning - 10a  . niacin  500 mg Oral QHS  . vitamin B-12  250 mcg Oral Daily  . vitamin C  1,000 mg Oral Daily    OBJECTIVE   Intake/Output Summary (Last 24 hours) at 10/16/14 0935 Last data filed at 10/15/14 1900  Gross per 24 hour  Intake    620 ml  Output      0 ml  Net    620 ml   Filed Weights   10/13/14 1150 10/16/14 0320  Weight: 152 lb (68.947 kg) 157 lb 11.2 oz (71.532 kg)    PHYSICAL EXAM Filed Vitals:   10/15/14 1916 10/15/14 2324 10/16/14 0320 10/16/14 0739  BP: 166/84 177/96 140/78 165/97  Pulse: 95  93 72  Temp: 97.3 F (36.3 C) 97.4 F (36.3 C) 97.6 F (36.4 C) 97.7 F (36.5 C)  TempSrc: Oral Oral Oral Oral  Resp: 18 16 16 18   Height:      Weight:   157 lb 11.2 oz (71.532 kg)   SpO2: 99% 94% 92% 90%   General: Alert, oriented x3, no distress Head: no evidence of trauma, PERRL, EOMI, no exophtalmos or lid lag, no myxedema, no xanthelasma; normal ears, nose and oropharynx Neck: normal jugular venous pulsations and no hepatojugular  reflux; brisk carotid pulses without delay and no carotid bruits Chest: clear to auscultation, no signs of consolidation by percussion or palpation, normal fremitus, symmetrical and full respiratory excursions Cardiovascular: normal position and quality of the apical impulse, irregular rhythm, normal first and second heart sounds, no rubs or gallops, no murmur Abdomen: no tenderness or distention, no masses by palpation, no abnormal pulsatility or arterial bruits, normal bowel sounds, no hepatosplenomegaly Extremities: no clubbing, cyanosis or edema; 2+ radial, ulnar and brachial pulses bilaterally; 2+ right femoral, posterior tibial and dorsalis pedis pulses; 2+ left femoral, posterior tibial and dorsalis pedis pulses; no subclavian or femoral bruits Neurological: grossly nonfocal  LABS  CBC  Recent Labs  10/15/14 0238 10/16/14 0245  WBC 9.3 9.4  HGB 13.5 13.8  HCT 40.0 41.6  MCV 97.1 96.7  PLT 191 974   Basic Metabolic Panel  Recent Labs  10/13/14 1159 10/13/14 1628  NA 136 141  K 3.6 4.1  CL 101 101  CO2 27 31  GLUCOSE 162* 91  BUN 13 10  CREATININE 1.00 1.00  CALCIUM 9.3 9.6   Liver Function Tests No results for input(s): AST, ALT, ALKPHOS,  BILITOT, PROT, ALBUMIN in the last 72 hours. No results for input(s): LIPASE, AMYLASE in the last 72 hours. Cardiac Enzymes  Recent Labs  10/13/14 1628 10/13/14 2157 10/14/14 0308  TROPONINI <0.03 0.03 0.03   BNP Invalid input(s): POCBNP D-Dimer No results for input(s): DDIMER in the last 72 hours. Hemoglobin A1C  Recent Labs  10/13/14 1628  HGBA1C 7.6*   Fasting Lipid Panel No results for input(s): CHOL, HDL, LDLCALC, TRIG, CHOLHDL, LDLDIRECT in the last 72 hours. Thyroid Function Tests No results for input(s): TSH, T4TOTAL, T3FREE, THYROIDAB in the last 72 hours.  Invalid input(s): [place]  Radiology Studies Imaging results have been reviewed and No results found.  TELE atrial fibrillation, intermittent V  paced  ECG atrial fibrillation, LVH, intermittent V paced  ASSESSMENT AND PLAN  Possible unstable angina with nondiagnostic ECG (due to LVH and V pacing), low risk biomarkers. For cardiac cath and possible PCi today. This procedure has been fully reviewed with the patient and written informed consent has been obtained.     Sanda Klein, MD, [facility] [phone] office [phone] pager 10/16/2014 9:35 AM

## 2014-10-16 NOTE — Progress Notes (Addendum)
[place] for heparin and warfarin Indication: a-fib  Allergies  Allergen Reactions  . Morphine Nausea Only  . Penicillins Other (See Comments)    Has patient had a PCN reaction causing immediate rash, facial/tongue/throat swelling, SOB or lightheadedness with hypotension: NO Has patient had a PCN reaction causing severe rash involving mucus membranes or skin necrosis: no Has patient had a PCN reaction that required hospitalization: NO Has patient had a PCN reaction occurring within the last 10 years: NO If all of the above answers are "NO", then may proceed with Cephalosporin use.     Patient Measurements: Height: 5' 4.5" (163.8 cm) Weight: 157 lb 11.2 oz (71.532 kg) IBW/kg (Calculated) : 55.85  Vital Signs: Temp: 97.8 F (36.6 C) (09/19 1119) Temp Source: Oral (09/19 1119) BP: 137/60 mmHg (09/19 1435) Pulse Rate: 57 (09/19 1440)  Labs:  Recent Labs  10/13/14 1628 10/13/14 2157  10/14/14 0308  10/14/14 2102 10/15/14 0238 10/16/14 0245 10/16/14 1108  HGB  --   --   < > 13.8  --   --  13.5 13.8  --   HCT  --   --   --  40.2  --   --  40.0 41.6  --   PLT  --   --   --  188  --   --  191 208  --   LABPROT 18.3*  --   --   --   --   --   --   --  15.8*  INR 1.51*  --   --   --   --   --   --   --  1.24  HEPARINUNFRC  --   --   --  0.20*  < > 0.56 0.55 0.51  --   CREATININE 1.00  --   --   --   --   --   --   --   --   TROPONINI <0.03 0.03  --  0.03  --   --   --   --   --   < > = values in this interval not displayed.  Assessment: 80 yo f admitted with CP. Pharmacy is dosing heparin to start 8 hours post sheath removal, as bridge to warfarin in the setting of a-fib. HL was therapeutic (0.51) on 1100 units/hr. PTA warfarin dose 2.5mg  daily. CBC stable, no reported bleeding issues.  Goal of Therapy:  Heparin level 0.3-0.7 units/ml Monitor platelets by anticoagulation protocol: Yes   Plan:  Heparin infusion at 1100  units/hr Warfarin 5 mg * 1 dose AM HL Daily CBC and INR Monitor for bleeding  Melburn Popper, PharmD Clinical Pharmacy Resident Pager: [phone] 10/16/2014 2:50 PM   ADDN: AM HL is therapeutic at 0.33 on heparin 1100 units/hr. No issues with infusion or bleeding noted. Continue current rate and recheck in 8h.  Andrey Cota. Diona Foley, PharmD Clinical Pharmacist Pager [phone]

## 2014-10-16 NOTE — Progress Notes (Signed)
Patient Name: Traci [name] Date of Encounter: 10/16/2014  Active Problems:   Atrial fibrillation   Encounter for monitoring coumadin therapy   CAD (coronary artery disease)   Essential hypertension, benign   Unstable angina   Chronic diastolic heart failure   Length of Stay: 3  SUBJECTIVE  No further angina or dyspnea. Symptoms on presentation were consistent with unstable angina. Remains in AF with intermittent pacing. ST changes are very prominent, but are readily explained by severe LVH documented on Echo and seen on ECG in 2014 (intervening ECGs were V paced) Normal cardiac enzymes.  CURRENT MEDS . amLODipine  5 mg Oral Daily  . aspirin EC  81 mg Oral Daily  . calcium-vitamin D  1 tablet Oral BID  . cholecalciferol  5,000 Units Oral Daily  . digoxin  125 mcg Oral q morning - 10a  . docusate sodium  100 mg Oral BID  . fesoterodine  4 mg Oral Daily  . losartan  50 mg Oral BID  . nebivolol  10 mg Oral q morning - 10a  . niacin  500 mg Oral QHS  . vitamin B-12  250 mcg Oral Daily  . vitamin C  1,000 mg Oral Daily    OBJECTIVE   Intake/Output Summary (Last 24 hours) at 10/16/14 0935 Last data filed at 10/15/14 1900  Gross per 24 hour  Intake    620 ml  Output      0 ml  Net    620 ml   Filed Weights   10/13/14 1150 10/16/14 0320  Weight: 152 lb (68.947 kg) 157 lb 11.2 oz (71.532 kg)    PHYSICAL EXAM Filed Vitals:   10/15/14 1916 10/15/14 2324 10/16/14 0320 10/16/14 0739  BP: 166/84 177/96 140/78 165/97  Pulse: 95  93 72  Temp: 97.3 F (36.3 C) 97.4 F (36.3 C) 97.6 F (36.4 C) 97.7 F (36.5 C)  TempSrc: Oral Oral Oral Oral  Resp: 18 16 16 18   Height:      Weight:   157 lb 11.2 oz (71.532 kg)   SpO2: 99% 94% 92% 90%   General: Alert, oriented x3, no distress Head: no evidence of trauma, PERRL, EOMI, no exophtalmos or lid lag, no myxedema, no xanthelasma; normal ears, nose and oropharynx Neck: normal jugular venous pulsations and no hepatojugular  reflux; brisk carotid pulses without delay and no carotid bruits Chest: clear to auscultation, no signs of consolidation by percussion or palpation, normal fremitus, symmetrical and full respiratory excursions Cardiovascular: normal position and quality of the apical impulse, irregular rhythm, normal first and second heart sounds, no rubs or gallops, no murmur Abdomen: no tenderness or distention, no masses by palpation, no abnormal pulsatility or arterial bruits, normal bowel sounds, no hepatosplenomegaly Extremities: no clubbing, cyanosis or edema; 2+ radial, ulnar and brachial pulses bilaterally; 2+ right femoral, posterior tibial and dorsalis pedis pulses; 2+ left femoral, posterior tibial and dorsalis pedis pulses; no subclavian or femoral bruits Neurological: grossly nonfocal  LABS  CBC  Recent Labs  10/15/14 0238 10/16/14 0245  WBC 9.3 9.4  HGB 13.5 13.8  HCT 40.0 41.6  MCV 97.1 96.7  PLT 191 132   Basic Metabolic Panel  Recent Labs  10/13/14 1159 10/13/14 1628  NA 136 141  K 3.6 4.1  CL 101 101  CO2 27 31  GLUCOSE 162* 91  BUN 13 10  CREATININE 1.00 1.00  CALCIUM 9.3 9.6   Liver Function Tests No results for input(s): AST, ALT, ALKPHOS,  BILITOT, PROT, ALBUMIN in the last 72 hours. No results for input(s): LIPASE, AMYLASE in the last 72 hours. Cardiac Enzymes  Recent Labs  10/13/14 1628 10/13/14 2157 10/14/14 0308  TROPONINI <0.03 0.03 0.03   BNP Invalid input(s): POCBNP D-Dimer No results for input(s): DDIMER in the last 72 hours. Hemoglobin A1C  Recent Labs  10/13/14 1628  HGBA1C 7.6*   Fasting Lipid Panel No results for input(s): CHOL, HDL, LDLCALC, TRIG, CHOLHDL, LDLDIRECT in the last 72 hours. Thyroid Function Tests No results for input(s): TSH, T4TOTAL, T3FREE, THYROIDAB in the last 72 hours.  Invalid input(s): [place]  Radiology Studies Imaging results have been reviewed and No results found.  TELE atrial fibrillation, intermittent V  paced  ECG atrial fibrillation, LVH, intermittent V paced  ASSESSMENT AND PLAN  Possible unstable angina with nondiagnostic ECG (due to LVH and V pacing), low risk biomarkers. For cardiac cath and possible PCi today. This procedure has been fully reviewed with the patient and written informed consent has been obtained.     Sanda Klein, MD, [facility] [phone] office [phone] pager 10/16/2014 9:35 AM

## 2014-10-16 NOTE — Care Management Important Message (Signed)
Important Message  Patient Details  Name: Traci [name] MRN: [id number] Date of Birth: [birth year]   Medicare Important Message Given:  Yes-second notification given    Delorse Lek 10/16/2014, 11:02 AM

## 2014-10-16 NOTE — Interval H&P Note (Signed)
History and Physical Interval Note:  10/16/2014 12:11 PM  Traci [name]  has presented today for surgery, with the diagnosis of unstable angina & Afib RVR.   The various methods of treatment have been discussed with the patient and family. After consideration of risks, benefits and other options for treatment, the patient has consented to  Procedure(s): Left Heart Cath and Coronary Angiography (N/A) With Possible Percutaneous Coronary Intervention as a surgical intervention .  The patient's history has been reviewed, patient examined, no change in status, stable for surgery.  I have reviewed the patient's chart and labs.  Questions were answered to the patient's satisfaction.     Cath Lab Visit (complete for each Cath Lab visit)  Clinical Evaluation Leading to the Procedure:   ACS: Yes.    Non-ACS:    Anginal Classification: CCS IV  Anti-ischemic medical therapy: Maximal Therapy (2 or more classes of medications)  Non-Invasive Test Results: No non-invasive testing performed  Prior CABG: No previous CABG  AUC FOR PCI TIMI SCORE  Patient Information:  TIMI Score is 3  UA/NSTEMI and intermediate-risk features (e.g., TIMI score 3?4) for short-term risk of death or nonfatal MI  Revascularization of the presumed culprit artery   A (9)  Indication: 10; Score: 9   HARDING, DAVID W

## 2014-10-16 NOTE — Progress Notes (Signed)
ANTICOAGULATION CONSULT NOTE - Follow Up Consult  Pharmacy Consult for heparin Indication: chest pain/ACS  Allergies  Allergen Reactions  . Morphine Nausea Only  . Penicillins Other (See Comments)    Has patient had a PCN reaction causing immediate rash, facial/tongue/throat swelling, SOB or lightheadedness with hypotension: NO Has patient had a PCN reaction causing severe rash involving mucus membranes or skin necrosis: no Has patient had a PCN reaction that required hospitalization: NO Has patient had a PCN reaction occurring within the last 10 years: NO If all of the above answers are "NO", then may proceed with Cephalosporin use.     Patient Measurements: Height: 5' 4.5" (163.8 cm) Weight: 157 lb 11.2 oz (71.532 kg) IBW/kg (Calculated) : 55.85  Vital Signs: Temp: 97.7 F (36.5 C) (09/19 0739) Temp Source: Oral (09/19 0739) BP: 161/90 mmHg (09/19 1017) Pulse Rate: 66 (09/19 1017)  Labs:  Recent Labs  10/13/14 1159 10/13/14 1628 10/13/14 2157 10/14/14 0308  10/14/14 2102 10/15/14 0238 10/16/14 0245  HGB 15.1*  --   --  13.8  --   --  13.5 13.8  HCT 43.6  --   --  40.2  --   --  40.0 41.6  PLT 227  --   --  188  --   --  191 208  LABPROT  --  18.3*  --   --   --   --   --   --   INR  --  1.51*  --   --   --   --   --   --   HEPARINUNFRC  --   --   --  0.20*  < > 0.56 0.55 0.51  CREATININE 1.00 1.00  --   --   --   --   --   --   TROPONINI <0.03 <0.03 0.03 0.03  --   --   --   --   < > = values in this interval not displayed.  Assessment: 80 yo f admitted with CP. Pharmacy is dosing heparin for rule out ACS. HL has been therapeutic (0.51) on 1100 units/hr. CBC stable, no issues per RN or family.  Goal of Therapy:  Heparin level 0.3-0.7 units/ml Monitor platelets by anticoagulation protocol: Yes   Plan:  Continue heparin infusion at 1100 units/hr Cath today Daily CBC Monitor for bleeding  Melburn Popper, PharmD Clinical Pharmacy Resident Pager:  [phone] 10/16/2014 10:26 AM

## 2014-10-16 NOTE — Progress Notes (Signed)
Site area: right groin a 5 french arterial sheath was removed  Site Prior to Removal:  Level 0  Pressure Applied For 15 MINUTES    Minutes Beginning at 1425  Manual:   Yes.    Patient Status During Pull:  stable  Post Pull Groin Site:  Level 0  Post Pull Instructions Given:  Yes.    Post Pull Pulses Present:  Yes.    Dressing Applied:  Yes.    Comments:  VS remain stable during sheath pull.

## 2014-10-16 NOTE — Progress Notes (Signed)
Inpatient Diabetes Program Recommendations  AACE/ADA: New Consensus Statement on Inpatient Glycemic Control (2015)  Target Ranges:  Prepandial:   less than 140 mg/dL      Peak postprandial:   less than 180 mg/dL (1-2 hours)      Critically ill patients:  140 - 180 mg/dL   Results for Traci [name], Traci [name] (MRN [id number]) as of 10/16/2014 09:08  Ref. Range 10/13/2014 11:59 10/13/2014 16:28  Hemoglobin A1C Latest Ref Range: 4.8-5.6 %  7.6 (H)  Glucose Latest Ref Range: 65-99 mg/dL 162 (H) 91   Review of Glycemic Control  Diabetes history: NO (Hx of glucose intolerance per chart review) Outpatient Diabetes medications: NA Current orders for Inpatient glycemic control: None  Inpatient Diabetes Program Recommendations: Correction (SSI): While inpatient please consider ordering CBGs with Novolog correction scale ACHS (or Q4H if NPO).  HgbA1C: A1C was 7.6% on 10/13/14 which meets ADA criteria for diagnosis of DM. Noted patient has a history of glucose intolerance. Please inform patient and nursing staff if patient will be diagnosed with DM2 so that patient can be educated prior to discharge. Also, consult diabetes coordinator if diagnosed with DM.  Thanks, Barnie Alderman, RN, MSN, CCRN, CDE Diabetes Coordinator Inpatient Diabetes Program [phone] (Team Pager from [place] to [place]) [phone] ([place] office) [phone] [facility] office) [phone] [facility] office)

## 2014-10-17 ENCOUNTER — Encounter (HOSPITAL_COMMUNITY): Payer: Self-pay | Admitting: Physician Assistant

## 2014-10-17 DIAGNOSIS — R079 Chest pain, unspecified: Secondary | ICD-10-CM

## 2014-10-17 LAB — PROTIME-INR
INR: 1.33 (ref 0.00–1.49)
Prothrombin Time: 16.6 seconds — ABNORMAL HIGH (ref 11.6–15.2)

## 2014-10-17 LAB — CBC
HCT: 43.5 % (ref 36.0–46.0)
Hemoglobin: 14.6 g/dL (ref 12.0–15.0)
MCH: 32.5 pg (ref 26.0–34.0)
MCHC: 33.6 g/dL (ref 30.0–36.0)
MCV: 96.9 fL (ref 78.0–100.0)
PLATELETS: 190 10*3/uL (ref 150–400)
RBC: 4.49 MIL/uL (ref 3.87–5.11)
RDW: 14.3 % (ref 11.5–15.5)
WBC: 10.4 10*3/uL (ref 4.0–10.5)

## 2014-10-17 LAB — HEPARIN LEVEL (UNFRACTIONATED): Heparin Unfractionated: 0.33 IU/mL (ref 0.30–0.70)

## 2014-10-17 MED ORDER — LOSARTAN POTASSIUM 50 MG PO TABS
50.0000 mg | ORAL_TABLET | Freq: Two times a day (BID) | ORAL | Status: DC
Start: 2014-10-17 — End: 2015-05-21

## 2014-10-17 MED ORDER — WARFARIN SODIUM 5 MG PO TABS
5.0000 mg | ORAL_TABLET | Freq: Once | ORAL | Status: DC
Start: 2014-10-17 — End: 2014-10-17

## 2014-10-17 NOTE — Progress Notes (Signed)
Pt being discharged home via wheelchair with family. Pt alert and oriented x4. VSS. Pt c/o no pain at this time. No signs of respiratory distress. Education complete and care plans resolved. IV removed with catheter intact and pt tolerated well. No further issues at this time. Pt to follow up with PCP. Stone,Heather R, RN 

## 2014-10-17 NOTE — Progress Notes (Signed)
  Patient Name: Traci [name] Date of Encounter: 10/17/2014  Active Problems:   Atrial fibrillation   Encounter for monitoring coumadin therapy   CAD (coronary artery disease)   Essential hypertension, benign   Unstable angina   Chronic diastolic heart failure   Length of Stay: 4  SUBJECTIVE  Feels well.  CURRENT MEDS . amLODipine  5 mg Oral Daily  . aspirin EC  81 mg Oral Daily  . calcium-vitamin D  1 tablet Oral BID  . cholecalciferol  5,000 Units Oral Daily  . digoxin  125 mcg Oral q morning - 10a  . docusate sodium  100 mg Oral BID  . losartan  50 mg Oral BID  . nebivolol  10 mg Oral q morning - 10a  . niacin  500 mg Oral QHS  . sodium chloride  3 mL Intravenous Q12H  . vitamin B-12  250 mcg Oral Daily  . vitamin C  1,000 mg Oral Daily  . warfarin  5 mg Oral ONCE-1800  . Warfarin - Pharmacist Dosing Inpatient   Does not apply q1800    OBJECTIVE   Intake/Output Summary (Last 24 hours) at 10/17/14 1158 Last data filed at 10/17/14 1100  Gross per 24 hour  Intake 1015.52 ml  Output      2 ml  Net 1013.52 ml   Filed Weights   10/13/14 1150 10/16/14 0320  Weight: 152 lb (68.947 kg) 157 lb 11.2 oz (71.532 kg)    PHYSICAL EXAM Filed Vitals:   10/17/14 0400 10/17/14 0732 10/17/14 0950 10/17/14 1119  BP: 165/61 172/77 136/68 160/84  Pulse:   73   Temp:  97.4 F (36.3 C)  97.6 F (36.4 C)  TempSrc:  Oral  Oral  Resp:  18  17  Height:      Weight:      SpO2: 92% 94%  98%   General: Alert, oriented x3, no distress Head: no evidence of trauma, PERRL, EOMI, no exophtalmos or lid lag, no myxedema, no xanthelasma; normal ears, nose and oropharynx Neck: normal jugular venous pulsations and no hepatojugular reflux; brisk carotid pulses without delay and no carotid bruits Chest: clear to auscultation, no signs of consolidation by percussion or palpation, normal fremitus, symmetrical and full respiratory excursions Cardiovascular: normal position and quality of the  apical impulse, irregular rhythm, normal first and second heart sounds, no rubs or gallops, no murmur Abdomen: no tenderness or distention, no masses by palpation, no abnormal pulsatility or arterial bruits, normal bowel sounds, no hepatosplenomegaly Extremities: no clubbing, cyanosis or edema; 2+ radial, ulnar and brachial pulses bilaterally; 2+ right femoral, posterior tibial and dorsalis pedis pulses; 2+ left femoral, posterior tibial and dorsalis pedis pulses; no subclavian or femoral bruits Neurological: grossly nonfocal  LABS  CBC  Recent Labs  10/16/14 0245 10/17/14 0540  WBC 9.4 10.4  HGB 13.8 14.6  HCT 41.6 43.5  MCV 96.7 96.9  PLT 208 625   Basic Metabolic Panel No results for input(s): NA, K, CL, CO2, GLUCOSE, BUN, CREATININE, CALCIUM, MG, PHOS in the last 72 hours. Liver Function Tests  Radiology Studies Imaging results have been reviewed and No results found.  TELE atrial fibrillation, intermittent V pacing   ASSESSMENT AND PLAN  Noncardiac chest pain. ECG changes related to LVH and intermittent V pacing. No complications at radial access site. DC home. INR before the end of the week.  Sanda Klein, MD, [facility] [phone] office [phone] pager 10/17/2014 11:58 AM

## 2014-10-17 NOTE — Discharge Instructions (Signed)
Please take Klonopin (clonazepam) only as needed. Avoid taking Klonopin daily due to side effect.  Please take 5mg  coumadin tonight and then restart on previous home dose. Please obtain PT/INR level at your primary care physician's office on Friday 10/20/2014 and have result send to Dr. Domenic Polite  During this admission, you have a hemoglobin A1C of 7.6 which is just within range for diagnosis of diabetes, please followup with your primary care physician

## 2014-10-17 NOTE — Progress Notes (Addendum)
Pt told phy there that she wanted outpt phy there. Asked phy there to let pt's nse know to call md and request prescriiption for outpt phy there. Pt lives in [place] Faxed prescription to [facility] to [phone]. Gave prescription to pt. [facility] will call pt to set up appt.

## 2014-10-17 NOTE — Discharge Summary (Signed)
Discharge Summary   Patient ID: Traci [name],  MRN: [id number], DOB/AGE: 78-Nov-1936 78 y.o.  Admit date: 10/13/2014 Discharge date: 10/17/2014  Primary Care Provider: DONDIEGO,RICHARD M Primary Cardiologist: Dr. Domenic Polite / Dr. Lovena Le   Discharge Diagnoses Principal Problem:   Chest pain at rest Active Problems:   Atrial fibrillation   Encounter for monitoring coumadin therapy   CAD (coronary artery disease)   Essential hypertension, benign   Chronic diastolic heart failure   Allergies Allergies  Allergen Reactions  . Morphine Nausea Only  . Penicillins Other (See Comments)    Has patient had a PCN reaction causing immediate rash, facial/tongue/throat swelling, SOB or lightheadedness with hypotension: NO Has patient had a PCN reaction causing severe rash involving mucus membranes or skin necrosis: no Has patient had a PCN reaction that required hospitalization: NO Has patient had a PCN reaction occurring within the last 10 years: NO If all of the above answers are "NO", then may proceed with Cephalosporin use.     Procedures  Cardiac catheterization 10/16/2014 Conclusion    1. There is hyperdynamic left ventricular systolic function. Normal LVEDP 2. Prox LAD lesion, 40% stenosed. Otherwise minimal coronary disease.   No potential culprit lesion to explain the patient's symptoms. Most likely consistent with atrial fibrillation.  Plan:  Remove TR band and holding area while pulling femoral sheath  Restart IV heparin tonight 8 hours post sheath removal. We'll also restart warfarin tonight.  Remainder of care per primary service.      Hospital Course  Patient is a 80 year old female with history of paroxysmal atrial fibrillation on Coumadin, SSS s/p medtronic PPM, hypertension, minor CAD on cardiac catheterization 2005 who presented to the ED with sudden onset of substernal chest pain. She described as a chest tightness with associated dyspnea and weakness. EKG  showed atrial fibrillation with diffuse ST depression and T-wave inversions in intrinsic conducted beats with ventricular pacing. She was admitted to cardiology service. Her symptom was concerning for unstable angina. After discussing various options, she has agreed to undergo diagnostic cardiac catheterization. Her Coumadin was held, and cardiac catheterization was expected once INR was less than 2.0. She was placed on IV nitroglycerin and heparin. Overnight her serial troponin was negative.   She underwent a scheduled cardiac catheterization on 10/16/2014 which showed hyperdynamic LV systolic function with normal LVEDP, 40% proximal LAD lesion, otherwise minimal coronary artery disease. No culprit lesion was found to explain the patient's symptom, her symptom is most likely consistent with atrial fibrillation. She was seen on 10/17/2014, at which time she is feeling well. EKG changes likely related to LVH and intermittent V pacing. Her radial cath site appears to be stable. She is deemed stable for discharge from cardiology perspective to restart on her Coumadin. She will need an INR check at her PCPs office on Friday 9/23. Case manager and I have also arranged for her to obtain outpatient PT which is recommended after seen by inpatient physical therapy. During this admission, she had an hgb A1C of 7.6 which is diagnostic of DM, i have instructed her to followup with her PCP on this. I have discussed with the pharmacist, per pharmacist recommendation, she will take 5 mg Coumadin tonight and resume her previous 2.5 mg daily tomorrow.   Discharge Vitals Blood pressure 160/84, pulse 73, temperature 97.6 F (36.4 C), temperature source Oral, resp. rate 17, height 5' 4.5" (1.638 m), weight 157 lb 11.2 oz (71.532 kg), SpO2 98 %.  Filed Weights   10/13/14 1150  10/16/14 0320  Weight: 152 lb (68.947 kg) 157 lb 11.2 oz (71.532 kg)    Labs  CBC  Recent Labs  10/16/14 0245 10/17/14 0540  WBC 9.4 10.4  HGB  13.8 14.6  HCT 41.6 43.5  MCV 96.7 96.9  PLT 208 190    Disposition  Pt is being discharged home today in good condition.  Follow-up Plans & Appointments      Follow-up Information    Follow up with Jory Sims, NP On 11/06/2014.   Specialties:  Nurse Practitioner, Radiology, Cardiology   Why:  1:10pm   Contact information:   [address] [place] [address] [phone]       Follow up with Maricela Curet, MD On 10/20/2014.   Specialty:  Internal Medicine   Why:  Please obtain your coumadin level PT/INR lab on 9/23 at your primary care physician's office and have result sent to Dr. Herma Ard office. Please also follows up with your primary care physician regarding Hgb A1C 7.6   Contact information:   [address] [place] [address] [phone]       Please follow up.   Why:  Outpatient physical therapy as recommended, case manager will arrange      Discharge Medications    Medication List    STOP taking these medications        ACIDOPHILUS PO     BEE POLLEN PO     LECITHIN PO      TAKE these medications        amLODipine 5 MG tablet  Commonly known as:  NORVASC  Take 1 tablet (5 mg total) by mouth daily.     Biotin 2500 MCG Caps  Take 1 capsule by mouth daily.     blood glucose meter kit and supplies  Dispense based on patient and insurance preference. Use to monitor FSBS one time daily as directed. DX: 250.00     CALCIUM 1200 PO  Take 1 capsule by mouth 2 (two) times daily.     clonazePAM 1 MG tablet  Commonly known as:  KLONOPIN  TAKE 1 TABLET BY MOUTH AT BEDTIME AS NEEDED     [employer] D3 5000 UNITS capsule  Generic drug:  Cholecalciferol  Take 5,000 Units by mouth daily.     digoxin 0.125 MG tablet  Commonly known as:  LANOXIN  Take 1 tablet (125 mcg total) by mouth every morning.     Fish Oil 1000 MG Caps  Take 1 capsule by mouth daily.     glucosamine-chondroitin 500-400 MG tablet  Take 2 tablets by mouth daily.       losartan 50 MG tablet  Commonly known as:  COZAAR  Take 1 tablet (50 mg total) by mouth 2 (two) times daily.     MAGNESIUM-POTASSIUM PO  Take 1 tablet by mouth daily.     nebivolol 10 MG tablet  Commonly known as:  BYSTOLIC  Take 10 mg by mouth every morning.     niacin 500 MG CR tablet  Commonly known as:  NIASPAN  Take 500 mg by mouth at bedtime.     pyridoxine 100 MG tablet  Commonly known as:  B-6  Take 100 mg by mouth daily.     TRINTELLIX 5 MG Tabs  Generic drug:  Vortioxetine HBr  Take 1 tablet by mouth daily.     vitamin B-12 250 MCG tablet  Commonly known as:  CYANOCOBALAMIN  Take 250 mcg by mouth daily.  vitamin C 1000 MG tablet  Take 1,000 mg by mouth daily.     warfarin 5 MG tablet  Commonly known as:  COUMADIN  Take 2.5 mg by mouth daily.        Outstanding Labs/Studies  PT/INR at PCP's office this Friday  Duration of Discharge Encounter   Greater than 30 minutes including physician time.  Hilbert Corrigan PA-C Pager: [phone] 10/17/2014, 2:40 PM

## 2014-10-17 NOTE — Evaluation (Signed)
Physical Therapy Evaluation Patient Details Name: Traci [name] MRN: [id number] DOB: 12-19-1934 Today's Date: 10/17/2014   History of Present Illness  80 y.o. female with hx of Afib (coumadin, implanted pacemaker) who presents to the Emergency Department complaining of central, constant, unchanged chest tightness. Pt with unstable angina s/p cardiac cath  Clinical Impression  Pt very pleasant, active and independent at baseline. Pt with decreased balance and gait since fall with pelvic fx in Feb with family noting increased LOB and difficulty with gait. Pt with impaired balance with improvement with use of RW. Pt with noted 5/5 bil LE strength all myotomes. Pt will benefit from acute therapy to maximize gait, balance and DME use to decrease burden of care and fall risk. Pt reported no SOB, pain or fatigue with activity.     Follow Up Recommendations Outpatient PT    Equipment Recommendations  Rolling walker with 5" wheels    Recommendations for Other Services       Precautions / Restrictions Precautions Precautions: Fall      Mobility  Bed Mobility Overal bed mobility: Modified Independent                Transfers Overall transfer level: Modified independent               General transfer comment: cues for hand placement only with presence of RW  Ambulation/Gait Ambulation/Gait assistance: Modified independent (Device/Increase time);Min guard Ambulation Distance (Feet): 400 Feet Assistive device: Rolling walker (2 wheeled);None Gait Pattern/deviations: Step-through pattern;Shuffle   Gait velocity interpretation: at or above normal speed for age/gender (with use of RW) General Gait Details: initiated gait without Rw with slow gait, cautious shuffling steps with veering right and left and minguard assist for stability with gait. utilized RW for 350' with increased gait speed, balance and stability with cues x 2 for safety and position in RW only  Stairs Stairs:  Yes Stairs assistance: Modified independent (Device/Increase time) Stair Management: One rail Right;Step to pattern;Forwards Number of Stairs: 5    Wheelchair Mobility    Modified Rankin (Stroke Patients Only)       Balance Overall balance assessment: Needs assistance   Sitting balance-Leahy Scale: Good       Standing balance-Leahy Scale: Fair   Single Leg Stance - Right Leg: 1 Single Leg Stance - Left Leg: 1 Tandem Stance - Right Leg: 0 Tandem Stance - Left Leg: 0 Rhomberg - Eyes Opened: 60 Rhomberg - Eyes Closed: 8                 Pertinent Vitals/Pain Pain Assessment: No/denies pain    Home Living Family/patient expects to be discharged to:: Private residence Living Arrangements: Alone Available Help at Discharge: Family;Available 24 hours/day Type of Home: House Home Access: Stairs to enter Entrance Stairs-Rails: Right Entrance Stairs-Number of Steps: 4 Home Layout: Multi-level Home Equipment: Shower seat      Prior Function Level of Independence: Independent         Comments: drives and cares for herself. fall in Feb with pelvic fx and has furniture walked since     Hand Dominance        Extremity/Trunk Assessment   Upper Extremity Assessment: Overall WFL for tasks assessed           Lower Extremity Assessment: Overall WFL for tasks assessed      Cervical / Trunk Assessment: Normal  Communication   Communication: No difficulties  Cognition Arousal/Alertness: Awake/alert Behavior During Therapy: WFL for tasks assessed/performed  Overall Cognitive Status: Within Functional Limits for tasks assessed                      General Comments      Exercises        Assessment/Plan    PT Assessment Patient needs continued PT services  PT Diagnosis Abnormality of gait   PT Problem List Decreased activity tolerance;Decreased balance;Decreased mobility;Decreased knowledge of use of DME  PT Treatment Interventions Gait  training;DME instruction;Functional mobility training;Therapeutic activities;Balance training   PT Goals (Current goals can be found in the Care Plan section) Acute Rehab PT Goals Patient Stated Goal: return home PT Goal Formulation: With patient/family Time For Goal Achievement: 10/31/14 Potential to Achieve Goals: Good    Frequency Min 3X/week   Barriers to discharge        Co-evaluation               End of Session   Activity Tolerance: Patient tolerated treatment well Patient left: in chair;with call bell/phone within reach;with family/visitor present Nurse Communication: Mobility status         Time: 1791-5056 PT Time Calculation (min) (ACUTE ONLY): 22 min   Charges:   PT Evaluation $Initial PT Evaluation Tier I: 1 Procedure     PT G CodesMelford Aase 10/17/2014, 1:19 PM Elwyn Reach, [place]

## 2014-10-24 ENCOUNTER — Ambulatory Visit (HOSPITAL_COMMUNITY): Payer: Medicare Other | Attending: Physician Assistant | Admitting: Physical Therapy

## 2014-10-24 DIAGNOSIS — Z9181 History of falling: Secondary | ICD-10-CM | POA: Diagnosis present

## 2014-10-24 DIAGNOSIS — R29898 Other symptoms and signs involving the musculoskeletal system: Secondary | ICD-10-CM | POA: Diagnosis present

## 2014-10-24 DIAGNOSIS — R262 Difficulty in walking, not elsewhere classified: Secondary | ICD-10-CM | POA: Diagnosis present

## 2014-10-24 NOTE — Therapy (Signed)
Traci [name], [place], [address] Phone: [phone]   Fax:  [phone]  Physical Therapy Evaluation  Patient Details  Name: Traci [name] MRN: [id number] Date of Birth: 02-01-1934 Referring Provider:  Almyra Deforest, PA  Encounter Date: 10/24/2014      PT End of Session - 10/24/14 1459    Visit Number 1   Number of Visits 1   PT Start Time 1430   PT Stop Time 1505   PT Time Calculation (min) 35 min   Activity Tolerance Patient tolerated treatment well   Behavior During Therapy [facility] for tasks assessed/performed      Past Medical History  Diagnosis Date  . Sick sinus syndrome     Medtronic PPM  . Chronic anticoagulation     Followed by PMD  . Hyperlipidemia   . Essential hypertension   . Carotid artery disease     Right carotid endarectomy 1999  . History of pneumonia     2010  . Glucose intolerance (impaired glucose tolerance)   . Coronary atherosclerosis     a. Minor at cardiac catheterization 2005 b. cath 10/16/2014 40% prox LAD dx, otherwise minimal CAD  . Arthritis   . History of kidney stones   . Atrial fibrillation     Past Surgical History  Procedure Laterality Date  . Appendectomy    . Abdominal hysterectomy    . Carotid endarectomy Right     1999  . Pacemaker placement      2006  . Colonoscopy      2006  . Cystoscopy w/ ureteral stent placement Left 04/19/2012    Procedure: CYSTOSCOPY WITH RETROGRADE PYELOGRAM/URETERAL STENT PLACEMENT ;  Surgeon: Ailene Rud, MD;  Location: [facility];  Service: Urology;  Laterality: Left;  . Breast biopsy Bilateral     x 7 total  . Eye surgery Bilateral     Cataract extraction with IOL  . Cystoscopy with retrograde pyelogram, ureteroscopy and stent placement Left 06/30/2012    Procedure: CYSTOSCOPY WITH LEFT  RETROGRADE PYELOGRAM, URETEROSCOPY  with basketing of stone, AND STENT PLACEMENT, TRANSURETHRAL UNROOFING OF URETER.;  Surgeon: Alexis Frock, MD;   Location: [facility];  Service: Urology;  Laterality: Left;  . Cardiac catheterization N/A 10/16/2014    Procedure: Left Heart Cath and Coronary Angiography;  Surgeon: Leonie Man, MD;  Location: [facility];  Service: Cardiovascular;  Laterality: N/A;    There were no vitals filed for this visit.  Visit Diagnosis:  Hx of fall  Difficulty walking up stairs  Proximal leg weakness      Subjective Assessment - 10/24/14 1430    Subjective [name] states that she wants to know why her MD sent her her.  She states that she has had one fall back in the winter when she had a wheelbarrrow full of wood going down a hill.     Pertinent History HTN, recently admitted to [facility] for chest discomfort.  A blockage was found but no stents placed.    How long can you sit comfortably? no problem   How long can you stand comfortably? 15-20 minutes    How long can you walk comfortably? walks to mailbox only-given benefit of walking sheet.    Currently in Pain? No/denies            [facility] PT Assessment - 10/24/14 0001    Assessment   Medical Diagnosis Gait instability with hx of falls  Onset Date/Surgical Date 07/24/14   Precautions   Precautions Fall   Restrictions   Weight Bearing Restrictions No   Balance Screen   Has the patient fallen in the past 6 months Yes   How many times? 1   Has the patient had a decrease in activity level because of a fear of falling?  No   Is the patient reluctant to leave their home because of a fear of falling?  No   Prior Function   Level of Independence Independent   Vocation Retired   Leisure none    Cognition   Overall Cognitive Status Within Functional Limits for tasks assessed   Observation/Other Assessments   Focus on Therapeutic Outcomes (FOTO)  80   Functional Tests   Functional tests Single leg stance;Sit to Stand   Single Leg Stance   Comments RT 23seconds; Lt 2 seconds    Sit to Stand   Comments 11 seconds for 5 sit to stands     ROM / Strength   AROM / PROM / Strength Strength   Strength   Strength Assessment Site Hip;Knee;Ankle   Right/Left Hip Right;Left   Right Hip Extension 3/5   Right Hip ABduction 5/5   Left Hip Flexion 5/5   Left Hip Extension 3/5   Left Hip ABduction 5/5   Right/Left Knee Right;Left   Right Knee Extension 5/5   Left Knee Extension 5/5   Right/Left Ankle Right;Left   Right Ankle Dorsiflexion 4+/5   Left Ankle Dorsiflexion 4+/5                   [facility] Adult PT Treatment/Exercise - November 15, 2014 0001    Exercises   Exercises Lumbar   Lumbar Exercises: Standing   Other Standing Lumbar Exercises SLS x 2    Lumbar Exercises: Supine   Bridge 10 reps   Lumbar Exercises: Prone   Straight Leg Raise 10 reps                PT Education - 11-15-14 1458    Education provided Yes   Education Details lipid low glycemic diet; SLS, bridge and hip extension and the importance of walking    Person(s) Educated Patient   Methods Explanation;Handout;Demonstration   Comprehension Verbalized understanding;Returned demonstration          PT Short Term Goals - 15-Nov-2014 1511    PT SHORT TERM GOAL #1   Title I with HEP   Time 1   Period Days   Status New                  Plan - 15-Nov-2014 1504    Clinical Impression Statement [name] states that she is unsure why the MD sent her here.  She states that she only fell once and that was when she was bringing a weheelbarrow full of wood down a hill and lost control.  She does not feel that ths needs skilled PT.  Evaluation was performed with noted decreased ability to SLS as well as noted weakness in B hip extension.   Pt states that she prefers to work on these issues at home.  Pt was given a HEP to address her deficits.    PT Next Visit Plan Discharge to HEP           G-Codes - 11/15/14 1513    Functional Limitation Mobility: Walking and moving around   Mobility: Walking and Moving Around Current Status ([id number])  At least 1 percent  but less than 20 percent impaired, limited or restricted   Mobility: Walking and Moving Around Goal Status [phone]) At least 1 percent but less than 20 percent impaired, limited or restricted   Mobility: Walking and Moving Around Discharge Status [phone]) At least 1 percent but less than 20 percent impaired, limited or restricted       Problem List Patient Active Problem List   Diagnosis Date Noted  . Chest pain at rest 10/17/2014  . Chronic diastolic heart failure 70/76/1518  . Peripheral neuropathy 08/31/2013  . Leg swelling 08/31/2013  . Encounter for monitoring coumadin therapy 04/06/2013  . CAD (coronary artery disease) 04/06/2013  . OA (osteoarthritis) 04/06/2013  . Other malaise and fatigue 04/06/2013  . Essential hypertension, benign 04/06/2013  . Atrial fibrillation 06/11/2010  . HYPERLIPIDEMIA 02/26/2010  . Cardiac pacemaker in situ 06/13/2009  Rayetta Humphrey, PT CLT [phone] 10/24/2014, 3:15 PM  [place], [place], [address] Phone: [phone]   Fax:  [phone]

## 2014-10-27 ENCOUNTER — Encounter: Payer: Self-pay | Admitting: Cardiology

## 2014-11-06 ENCOUNTER — Encounter: Payer: Self-pay | Admitting: Adult Health

## 2014-11-06 ENCOUNTER — Ambulatory Visit (INDEPENDENT_AMBULATORY_CARE_PROVIDER_SITE_OTHER): Payer: Medicare Other | Admitting: Adult Health

## 2014-11-06 VITALS — BP 126/68 | HR 88 | Ht 64.5 in | Wt 155.6 lb

## 2014-11-06 DIAGNOSIS — I4819 Other persistent atrial fibrillation: Secondary | ICD-10-CM

## 2014-11-06 DIAGNOSIS — I10 Essential (primary) hypertension: Secondary | ICD-10-CM

## 2014-11-06 DIAGNOSIS — I481 Persistent atrial fibrillation: Secondary | ICD-10-CM

## 2014-11-06 NOTE — Patient Instructions (Signed)
Your physician wants you to follow-up in: 3 months with Traci [name]. You will receive a reminder letter in the mail two months in advance. If you don't receive a letter, please call our office to schedule the follow-up appointment.  Your physician recommends that you continue on your current medications as directed. Please refer to the Current Medication list given to you today.  Thanks for choosing [facility]!!!

## 2014-11-06 NOTE — Progress Notes (Signed)
Cardiology Office Note   Date:  11/06/2014   ID:  Adaley, Kiene October 03, 1934, MRN [id number]  PCP:  Maricela Curet, MD  Cardiologist: Bryna Colander, NP   Chief Complaint  Patient presents with  . Atrial Fibrillation  . Hypertension  . Coronary Artery Disease      History of Present Illness: Traci [name] is a 80 y.o. female who presents for history for ongoing assessment and management of paroxysmal atrial fibrillation, CHADSVASC Score of 4 on coumadin, PPM in the setting of SSS, hypertension, minor CAD at cardiac catheterization 2005. She was admitted to [facility] on 89/16/2016 with chest pain. She described as a chest tightness with associated dyspnea and weakness. EKG showed atrial fibrillation with diffuse ST depression and T-wave inversions in intrinsic conducted beats with ventricular pacing.  She underwent a scheduled cardiac catheterization on 10/16/2014 which showed hyperdynamic LV systolic function with normal LVEDP, 40% proximal LAD lesion, otherwise minimal coronary artery disease. No culprit lesion was found to explain the patient's symptom, her symptom is most likely consistent with atrial fibrillation.During this admission, she had an hgb A1C of 7.6 which is diagnostic of DM.  She is without complaint today.  She has significant memory issues, and states that she does not remember being overconfident having catheterization.  Her daughters look after her and make sure she takes her medications.  She denies any symptoms of chest pain or dyspnea.  She notes that she has a followup appointment tomorrow with her primary care physician.  Past Medical History  Diagnosis Date  . Sick sinus syndrome     Medtronic PPM  . Chronic anticoagulation     Followed by PMD  . Hyperlipidemia   . Essential hypertension   . Carotid artery disease     Right carotid endarectomy 1999  . History of pneumonia     2010  . Glucose intolerance (impaired glucose tolerance)   .  Coronary atherosclerosis     a. Minor at cardiac catheterization 2005 b. cath 10/16/2014 40% prox LAD dx, otherwise minimal CAD  . Arthritis   . History of kidney stones   . Atrial fibrillation     Past Surgical History  Procedure Laterality Date  . Appendectomy    . Abdominal hysterectomy    . Carotid endarectomy Right     1999  . Pacemaker placement      2006  . Colonoscopy      2006  . Cystoscopy w/ ureteral stent placement Left 04/19/2012    Procedure: CYSTOSCOPY WITH RETROGRADE PYELOGRAM/URETERAL STENT PLACEMENT ;  Surgeon: Ailene Rud, MD;  Location: [facility];  Service: Urology;  Laterality: Left;  . Breast biopsy Bilateral     x 7 total  . Eye surgery Bilateral     Cataract extraction with IOL  . Cystoscopy with retrograde pyelogram, ureteroscopy and stent placement Left 06/30/2012    Procedure: CYSTOSCOPY WITH LEFT  RETROGRADE PYELOGRAM, URETEROSCOPY  with basketing of stone, AND STENT PLACEMENT, TRANSURETHRAL UNROOFING OF URETER.;  Surgeon: Alexis Frock, MD;  Location: [facility];  Service: Urology;  Laterality: Left;  . Cardiac catheterization N/A 10/16/2014    Procedure: Left Heart Cath and Coronary Angiography;  Surgeon: Leonie Man, MD;  Location: [place] [facility];  Service: Cardiovascular;  Laterality: N/A;     Current Outpatient Prescriptions  Medication Sig Dispense Refill  . amLODipine (NORVASC) 5 MG tablet Take 1 tablet (5 mg total) by mouth daily. 30 tablet 3  . Ascorbic  Acid (VITAMIN C) 1000 MG tablet Take 1,000 mg by mouth daily.     . Biotin 2500 MCG CAPS Take 1 capsule by mouth daily.    . Blood Glucose Monitoring Suppl (BLOOD GLUCOSE METER KIT AND SUPPLIES) Dispense based on patient and insurance preference. Use to monitor FSBS one time daily as directed. DX: 250.00 1 each 0  . Calcium Carbonate-Vit D-Min (CALCIUM 1200 PO) Take 1 capsule by mouth 2 (two) times daily.     . Cholecalciferol (CVS D3) 5000 UNITS capsule Take 5,000 Units by mouth daily.     . clonazePAM (KLONOPIN) 1 MG tablet TAKE 1 TABLET BY MOUTH AT BEDTIME AS NEEDED 30 tablet 2  . digoxin (LANOXIN) 0.125 MG tablet Take 1 tablet (125 mcg total) by mouth every morning. 90 tablet 3  . glucosamine-chondroitin 500-400 MG tablet Take 2 tablets by mouth daily.     Marland Kitchen losartan (COZAAR) 50 MG tablet Take 1 tablet (50 mg total) by mouth 2 (two) times daily. 60 tablet 2  . MAGNESIUM-POTASSIUM PO Take 1 tablet by mouth daily.    . nebivolol (BYSTOLIC) 10 MG tablet Take 10 mg by mouth every morning.    . niacin (NIASPAN) 500 MG CR tablet Take 500 mg by mouth at bedtime.    . Omega-3 Fatty Acids (FISH OIL) 1000 MG CAPS Take 1 capsule by mouth daily.    Marland Kitchen pyridoxine (B-6) 100 MG tablet Take 100 mg by mouth daily.     . TRINTELLIX 5 MG TABS Take 1 tablet by mouth daily.  1  . vitamin B-12 (CYANOCOBALAMIN) 250 MCG tablet Take 250 mcg by mouth daily.    Marland Kitchen warfarin (COUMADIN) 5 MG tablet Take 2.5 mg by mouth daily.      No current facility-administered medications for this visit.    Allergies:   Morphine and Penicillins    Social History:  The patient  reports that she has never smoked. She has never used smokeless tobacco. She reports that she does not drink alcohol or use illicit drugs.   Family History:  The patient's family history includes Alcohol abuse in her father; Breast cancer in her sister and sister; Coronary artery disease in her father; Diabetes in her mother and sister; Heart attack in her mother; Hyperlipidemia in her father and mother; Hypertension in her sister.    ROS: All other systems are reviewed and negative. Unless otherwise mentioned in H&P    PHYSICAL EXAM: VS:  There were no vitals taken for this visit. , BMI There is no weight on file to calculate BMI. GEN: Well nourished, well developed, in no acute distress HEENT: normal Neck: no JVD, carotid bruits, or masses Cardiac: IRRR; no murmurs, rubs, or gallops,no edema  Respiratory:  clear to auscultation  bilaterally, normal work of breathing GI: soft, nontender, nondistended, + BS MS: no deformity or atrophy Skin: warm and dry, no rash Neuro:  Strength and sensation are intact Psych: euthymic mood, full affect  Recent Labs: 12/16/2013: TSH 2.115 10/13/2014: BUN 10; Creatinine, Ser 1.00; Potassium 4.1; Sodium 141 10/14/2014: B Natriuretic Peptide 281.7* 10/17/2014: Hemoglobin 14.6; Platelets 190    Lipid Panel    Component Value Date/Time   CHOL 161 04/20/2013 0909   TRIG 143 04/20/2013 0909   TRIG 176 04/30/2006   HDL 36* 04/20/2013 0909   CHOLHDL 4.5 04/20/2013 0909   VLDL 29 04/20/2013 0909   LDLCALC 96 04/20/2013 0909   LDLCALC 100 04/30/2006      Wt Readings from  Last 3 Encounters:  10/16/14 157 lb 11.2 oz (71.532 kg)  04/20/14 148 lb (67.132 kg)  03/20/14 148 lb 12.8 oz (67.495 kg)      Other studies Reviewed: Additional studies/ records that were reviewed today include: None Review of the above records demonstrates: N/A   ASSESSMENT AND PLAN:  1. Atrial fibrillation: Rate is controlled.  She is compliant with her medications, although she does not know what she is taking, she has her daughter and daughter-in-law, who prepared the medications for her.  She is followed by her primary care physician for labs.  Our office follows her Coumadin dosing. She is stable from a cardiac standpoint.  I will not make any changes.  We will see her again in 3 months to reevaluate her symptoms.  I am concerned about her memory loss.  2. Hypertension: blood pressure is well-controlled today.  I will not make any changes in her medication regimen currently.  3. Questionable diabetes:elevated hemoglobin A1c during hospitalization 7.6.  The patient states she does not want to talk about whether or not she has diabetes, and does not wish to worry about this now. She states that she will talk about it with her primary care physician on follow-up appointment.  I have advised her to do so for  any further recommendations are treatments.  Current medicines are reviewed at length with the patient today.    Labs/ tests ordered today include: None No orders of the defined types were placed in this encounter.     Disposition:   FU with 3 months   Signed, Jory Sims, NP  11/06/2014 1:09 PM    [facility] [address], [place], [place] [address] Phone: [phone]; Fax: [phone]

## 2014-11-06 NOTE — Progress Notes (Signed)
Name: Traci [name]    DOB: 1934-05-07  Age: 80 y.o.  MR#: [id number]       PCP:  Maricela Curet, MD      Insurance: Payor: BLUE CROSS BLUE SHIELD MEDICARE / Plan: BCBS MEDICARE / Product Type: *No Product type* /   CC:    Chief Complaint  Patient presents with  . Atrial Fibrillation  . Hypertension  . Coronary Artery Disease    VS Filed Vitals:   11/06/14 1310  BP: 126/68  Pulse: 88  Height: 5' 4.5" (1.638 m)  Weight: 155 lb 9.6 oz (70.58 kg)  SpO2: 93%    Weights Current Weight  11/06/14 155 lb 9.6 oz (70.58 kg)  10/16/14 157 lb 11.2 oz (71.532 kg)  04/20/14 148 lb (67.132 kg)    Blood Pressure  BP Readings from Last 3 Encounters:  11/06/14 126/68  10/17/14 160/84  04/20/14 136/80     Admit date:  (Not on file) Last encounter with RMR:  Visit date not found   Allergy Morphine and Penicillins  Current Outpatient Prescriptions  Medication Sig Dispense Refill  . amLODipine (NORVASC) 5 MG tablet Take 1 tablet (5 mg total) by mouth daily. 30 tablet 3  . Ascorbic Acid (VITAMIN C) 1000 MG tablet Take 1,000 mg by mouth daily.     . Biotin 2500 MCG CAPS Take 1 capsule by mouth daily.    . Blood Glucose Monitoring Suppl (BLOOD GLUCOSE METER KIT AND SUPPLIES) Dispense based on patient and insurance preference. Use to monitor FSBS one time daily as directed. DX: 250.00 1 each 0  . Calcium Carbonate-Vit D-Min (CALCIUM 1200 PO) Take 1 capsule by mouth 2 (two) times daily.     . Cholecalciferol (CVS D3) 5000 UNITS capsule Take 5,000 Units by mouth daily.    . clonazePAM (KLONOPIN) 1 MG tablet TAKE 1 TABLET BY MOUTH AT BEDTIME AS NEEDED 30 tablet 2  . digoxin (LANOXIN) 0.125 MG tablet Take 1 tablet (125 mcg total) by mouth every morning. 90 tablet 3  . losartan (COZAAR) 50 MG tablet Take 1 tablet (50 mg total) by mouth 2 (two) times daily. 60 tablet 2  . MAGNESIUM-POTASSIUM PO Take 1 tablet by mouth daily.    . nebivolol (BYSTOLIC) 10 MG tablet Take 10 mg by mouth every  morning.    . niacin (NIASPAN) 500 MG CR tablet Take 500 mg by mouth at bedtime.    . vitamin B-12 (CYANOCOBALAMIN) 250 MCG tablet Take 250 mcg by mouth daily.    Marland Kitchen warfarin (COUMADIN) 5 MG tablet Take 2.5 mg by mouth daily.     Marland Kitchen pyridoxine (B-6) 100 MG tablet Take 100 mg by mouth daily.     . TRINTELLIX 5 MG TABS Take 1 tablet by mouth daily.  1   No current facility-administered medications for this visit.    Discontinued Meds:    Medications Discontinued During This Encounter  Medication Reason  . Omega-3 Fatty Acids (FISH OIL) 1000 MG CAPS Error  . glucosamine-chondroitin 500-400 MG tablet Error    Patient Active Problem List   Diagnosis Date Noted  . Chest pain at rest 10/17/2014  . Chronic diastolic heart failure ([place]) 10/14/2014  . Peripheral neuropathy ([place]) 08/31/2013  . Leg swelling 08/31/2013  . Encounter for monitoring coumadin therapy 04/06/2013  . CAD (coronary artery disease) 04/06/2013  . OA (osteoarthritis) 04/06/2013  . Other malaise and fatigue 04/06/2013  . Essential hypertension, benign 04/06/2013  . Atrial fibrillation ([place]) 06/11/2010  .  HYPERLIPIDEMIA 02/26/2010  . Cardiac pacemaker in situ 06/13/2009    LABS    Component Value Date/Time   NA 141 10/13/2014 1628   NA 136 10/13/2014 1159   NA 142 08/31/2013 0933   K 4.1 10/13/2014 1628   K 3.6 10/13/2014 1159   K 3.8 08/31/2013 0933   CL 101 10/13/2014 1628   CL 101 10/13/2014 1159   CL 103 08/31/2013 0933   CO2 31 10/13/2014 1628   CO2 27 10/13/2014 1159   CO2 26 08/31/2013 0933   GLUCOSE 91 10/13/2014 1628   GLUCOSE 162* 10/13/2014 1159   GLUCOSE 225* 08/31/2013 0933   BUN 10 10/13/2014 1628   BUN 13 10/13/2014 1159   BUN 17 08/31/2013 0933   CREATININE 1.00 10/13/2014 1628   CREATININE 1.00 10/13/2014 1159   CREATININE 0.93 08/31/2013 0933   CREATININE 0.89 04/20/2013 0909   CREATININE 0.91 06/28/2012 1500   CREATININE 0.91 07/23/2010 1622   CALCIUM 9.6 10/13/2014 1628   CALCIUM  9.3 10/13/2014 1159   CALCIUM 9.9 08/31/2013 0933   GFRNONAA 53* 10/13/2014 1628   GFRNONAA 53* 10/13/2014 1159   GFRNONAA 60* 06/28/2012 1500   GFRAA >60 10/13/2014 1628   GFRAA >60 10/13/2014 1159   GFRAA 70* 06/28/2012 1500   CMP     Component Value Date/Time   NA 141 10/13/2014 1628   K 4.1 10/13/2014 1628   CL 101 10/13/2014 1628   CO2 31 10/13/2014 1628   GLUCOSE 91 10/13/2014 1628   BUN 10 10/13/2014 1628   CREATININE 1.00 10/13/2014 1628   CREATININE 0.93 08/31/2013 0933   CALCIUM 9.6 10/13/2014 1628   PROT 6.6 08/31/2013 0933   ALBUMIN 3.9 08/31/2013 0933   AST 19 08/31/2013 0933   ALT 16 08/31/2013 0933   ALKPHOS 50 08/31/2013 0933   BILITOT 0.7 08/31/2013 0933   GFRNONAA 53* 10/13/2014 1628   GFRAA >60 10/13/2014 1628       Component Value Date/Time   WBC 10.4 10/17/2014 0540   WBC 9.4 10/16/2014 0245   WBC 9.3 10/15/2014 0238   HGB 14.6 10/17/2014 0540   HGB 13.8 10/16/2014 0245   HGB 13.5 10/15/2014 0238   HCT 43.5 10/17/2014 0540   HCT 41.6 10/16/2014 0245   HCT 40.0 10/15/2014 0238   MCV 96.9 10/17/2014 0540   MCV 96.7 10/16/2014 0245   MCV 97.1 10/15/2014 0238    Lipid Panel     Component Value Date/Time   CHOL 161 04/20/2013 0909   TRIG 143 04/20/2013 0909   TRIG 176 04/30/2006   HDL 36* 04/20/2013 0909   CHOLHDL 4.5 04/20/2013 0909   VLDL 29 04/20/2013 0909   LDLCALC 96 04/20/2013 0909   LDLCALC 100 04/30/2006    ABG    Component Value Date/Time   TCO2  03/16/2010 1711    29 PATIENT IDENTIFICATION ERROR. PLEASE DISREGARD RESULTS. ACCOUNT WILL BE CREDITED.     Lab Results  Component Value Date   TSH 2.115 12/16/2013   BNP (last 3 results)  Recent Labs  10/14/14 1244  BNP 281.7*    ProBNP (last 3 results) No results for input(s): PROBNP in the last 8760 hours.  Cardiac Panel (last 3 results) No results for input(s): CKTOTAL, CKMB, TROPONINI, RELINDX in the last 72 hours.  Iron/TIBC/Ferritin/ %Sat No results found for:  IRON, TIBC, FERRITIN, IRONPCTSAT   EKG Orders placed or performed during the hospital encounter of 10/13/14  . EKG 12-Lead  . EKG 12-Lead  .  ED EKG within 10 minutes  . ED EKG within 10 minutes  . EKG 12-Lead  . EKG 12-Lead  . EKG     Prior Assessment and Plan Problem List as of 11/06/2014      Cardiovascular and Mediastinum   Atrial fibrillation [facility])   Last Assessment & Plan 12/16/2013 Office Visit Written 12/18/2013  9:45 PM by Evans Lance, MD    Her ventricular rate is well controlled. She will continue her current meds.      CAD (coronary artery disease)   Last Assessment & Plan 10/13/2013 Office Visit Written 10/13/2013  1:24 PM by Evans Lance, MD    She has a h/o CAD but does not have angina. I am concerned that her sob and fatigue could be her anginal equivalent and will have her undergo a 2D echo and lexiscan myoview.      Essential hypertension, benign   Last Assessment & Plan 12/16/2013 Office Visit Written 12/18/2013  9:49 PM by Evans Lance, MD    Her blood pressure is reasonably well controlled. She will continue her current meds.      Chronic diastolic heart failure (HCC)     Nervous and Auditory   Peripheral neuropathy [facility])   Last Assessment & Plan 08/31/2013 Office Visit Written 08/31/2013  4:49 PM by Alycia Rossetti, MD    Possible she has some underlying neuropathy, I will d/c norvasc first to see if this makes a difference She is not diabetic Blood flow looks good on exam        Musculoskeletal and Integument   OA (osteoarthritis)     Other   Cardiac pacemaker in situ   Last Assessment & Plan 12/16/2013 Office Visit Written 12/18/2013  9:44 PM by Evans Lance, MD    His medtronic DDD PM is working normally. Will recheck in several months.      HYPERLIPIDEMIA   Last Assessment & Plan 04/06/2013 Office Visit Written 04/06/2013 12:51 PM by Alycia Rossetti, MD    Needs statin therapy, check FLP, planb to start lipitor generic        Encounter for monitoring coumadin therapy   Last Assessment & Plan 08/31/2013 Office Visit Written 08/31/2013  4:50 PM by Alycia Rossetti, MD    Coumadin at goal continue current dose      Other malaise and fatigue   Last Assessment & Plan 04/06/2013 Office Visit Written 04/06/2013  1:35 PM by Alycia Rossetti, MD    I think this is probably multifactorial with her age but she is politely adamant that it is her medications. I will have her cut the losartan inhalf she thinks that this is the culprit and see how she does      Leg swelling   Last Assessment & Plan 08/31/2013 Office Visit Written 08/31/2013  4:51 PM by Alycia Rossetti, MD    D/c norvasc and see how she does      Chest pain at rest       Imaging: Dg Chest Portable 1 View  10/13/2014   CLINICAL DATA:  Sudden onset mid chest pain 30 min ago. History of hypertension.  EXAM: PORTABLE CHEST - 1 VIEW  COMPARISON:  06/28/2012  FINDINGS: Left-sided AICD leads to the right atrium and right ventricle. The heart is markedly enlarged and probably stable accounting for differences in patient position. There is mild pulmonary vascular congestion but no overt edema. No focal consolidations or pleural effusions.  IMPRESSION: 1.  Cardiomegaly. 2. Pulmonary vascular congestion.   Electronically Signed   By: Nolon Nations M.D.   On: 10/13/2014 12:35

## 2014-11-08 ENCOUNTER — Encounter: Payer: Self-pay | Admitting: Internal Medicine

## 2014-12-25 ENCOUNTER — Encounter: Payer: Medicare Other | Admitting: Internal Medicine

## 2015-01-09 ENCOUNTER — Ambulatory Visit (INDEPENDENT_AMBULATORY_CARE_PROVIDER_SITE_OTHER): Payer: Medicare Other | Admitting: Orthopedic Surgery

## 2015-01-09 ENCOUNTER — Ambulatory Visit (INDEPENDENT_AMBULATORY_CARE_PROVIDER_SITE_OTHER): Payer: Medicare Other | Admitting: Internal Medicine

## 2015-01-09 ENCOUNTER — Encounter: Payer: Self-pay | Admitting: Internal Medicine

## 2015-01-09 VITALS — BP 138/78 | HR 84 | Ht 64.0 in | Wt 153.0 lb

## 2015-01-09 VITALS — Ht 64.0 in | Wt 163.0 lb

## 2015-01-09 DIAGNOSIS — I4819 Other persistent atrial fibrillation: Secondary | ICD-10-CM

## 2015-01-09 DIAGNOSIS — I10 Essential (primary) hypertension: Secondary | ICD-10-CM | POA: Diagnosis not present

## 2015-01-09 DIAGNOSIS — Z950 Presence of cardiac pacemaker: Secondary | ICD-10-CM

## 2015-01-09 DIAGNOSIS — I481 Persistent atrial fibrillation: Secondary | ICD-10-CM | POA: Diagnosis not present

## 2015-01-09 DIAGNOSIS — M7551 Bursitis of right shoulder: Secondary | ICD-10-CM

## 2015-01-09 LAB — CUP PACEART INCLINIC DEVICE CHECK
Battery Voltage: 2.76 V
Brady Statistic AP VS Percent: 1 %
Brady Statistic AS VP Percent: 0 %
Brady Statistic AS VS Percent: 95 %
Implantable Lead Implant Date: 20060525
Implantable Lead Location: 753859
Implantable Lead Model: 5076
Lead Channel Pacing Threshold Amplitude: 0.75 V
Lead Channel Pacing Threshold Amplitude: 0.75 V
Lead Channel Pacing Threshold Pulse Width: 0.4 ms
Lead Channel Pacing Threshold Pulse Width: 0.4 ms
Lead Channel Sensing Intrinsic Amplitude: 11.2 mV
Lead Channel Setting Pacing Amplitude: 2.75 V
Lead Channel Setting Pacing Pulse Width: 0.4 ms
Lead Channel Setting Sensing Sensitivity: 4 mV
MDC IDC LEAD IMPLANT DT: 20060525
MDC IDC LEAD LOCATION: 753860
MDC IDC MSMT BATTERY IMPEDANCE: 1124 Ohm
MDC IDC MSMT BATTERY REMAINING LONGEVITY: 50 mo
MDC IDC MSMT LEADCHNL RA IMPEDANCE VALUE: 505 Ohm
MDC IDC MSMT LEADCHNL RA SENSING INTR AMPL: 0.35 mV
MDC IDC MSMT LEADCHNL RV IMPEDANCE VALUE: 601 Ohm
MDC IDC SESS DTM: 20161213123227
MDC IDC SET LEADCHNL RV PACING AMPLITUDE: 2.5 V
MDC IDC STAT BRADY AP VP PERCENT: 4 %

## 2015-01-09 NOTE — Patient Instructions (Signed)
Your physician wants you to follow-up in: 1 Year with Dr. Lovena Le.  You will receive a reminder letter in the mail two months in advance. If you don't receive a letter, please call our office to schedule the follow-up appointment.  Remote monitoring is used to monitor your Pacemaker of ICD from home. This monitoring reduces the number of office visits required to check your device to one time per year. It allows [place] to keep an eye on the functioning of your device to ensure it is working properly. You are scheduled for a device check from home on 04/10/15. You may send your transmission at any time that day. If you have a wireless device, the transmission will be sent automatically. After your physician reviews your transmission, you will receive a postcard with your next transmission date.  If you need a refill on your cardiac medications before your next appointment, please call your pharmacy.  Thank you for choosing [facility]!

## 2015-01-09 NOTE — Assessment & Plan Note (Signed)
She is out of rhythm about 50% of the time. She is asymptomatic. She will continue her current meds.

## 2015-01-09 NOTE — Assessment & Plan Note (Signed)
Her blood pressure is fairly well controlled. No change in meds. 

## 2015-01-09 NOTE — Progress Notes (Signed)
HPI Traci [name] returns today for followup. She is a 80 year old woman with chronic atrial fibrillation, hypertension, and symptomatic tachycardia bradycardia syndrome, status post permanent pacemaker insertion. When I saw the patient a few weeks ago, she had complained of fatigue, weakness and sob. She not told me that her husband had terminal cancer and she was caring for him around the clock. He subsequently passed away and over the past few weeks she is much improved. She is back to baseline. No edema, chest pain or palpitations.  Allergies  Allergen Reactions  . Morphine Nausea Only  . Penicillins Other (See Comments)    Has patient had a PCN reaction causing immediate rash, facial/tongue/throat swelling, SOB or lightheadedness with hypotension: NO Has patient had a PCN reaction causing severe rash involving mucus membranes or skin necrosis: no Has patient had a PCN reaction that required hospitalization: NO Has patient had a PCN reaction occurring within the last 10 years: NO If all of the above answers are "NO", then may proceed with Cephalosporin use.      Current Outpatient Prescriptions  Medication Sig Dispense Refill  . amLODipine (NORVASC) 5 MG tablet Take 1 tablet (5 mg total) by mouth daily. 30 tablet 3  . Ascorbic Acid (VITAMIN C) 1000 MG tablet Take 1,000 mg by mouth daily.     . Biotin 2500 MCG CAPS Take 1 capsule by mouth daily.    . Blood Glucose Monitoring Suppl (BLOOD GLUCOSE METER KIT AND SUPPLIES) Dispense based on patient and insurance preference. Use to monitor FSBS one time daily as directed. DX: 250.00 1 each 0  . Calcium Carbonate-Vit D-Min (CALCIUM 1200 PO) Take 1 capsule by mouth 2 (two) times daily.     . Cholecalciferol ([employer] D3) 5000 UNITS capsule Take 5,000 Units by mouth daily.    . clonazePAM (KLONOPIN) 1 MG tablet TAKE 1 TABLET BY MOUTH AT BEDTIME AS NEEDED 30 tablet 2  . digoxin (LANOXIN) 0.125 MG tablet Take 1 tablet (125 mcg total) by mouth every  morning. 90 tablet 3  . losartan (COZAAR) 50 MG tablet Take 1 tablet (50 mg total) by mouth 2 (two) times daily. 60 tablet 2  . MAGNESIUM-POTASSIUM PO Take 1 tablet by mouth daily.    . nebivolol (BYSTOLIC) 10 MG tablet Take 10 mg by mouth every morning.    . niacin (NIASPAN) 500 MG CR tablet Take 500 mg by mouth at bedtime.    . pyridoxine (B-6) 100 MG tablet Take 100 mg by mouth daily.     . TRINTELLIX 5 MG TABS Take 1 tablet by mouth daily.  1  . vitamin B-12 (CYANOCOBALAMIN) 250 MCG tablet Take 250 mcg by mouth daily.    Marland Kitchen warfarin (COUMADIN) 5 MG tablet Take 2.5 mg by mouth daily.      No current facility-administered medications for this visit.     Past Medical History  Diagnosis Date  . Sick sinus syndrome (HCC)     Medtronic PPM  . Chronic anticoagulation     Followed by PMD  . Hyperlipidemia   . Essential hypertension   . Carotid artery disease ([place])     Right carotid endarectomy 1999  . History of pneumonia     2010  . Glucose intolerance (impaired glucose tolerance)   . Coronary atherosclerosis     a. Minor at cardiac catheterization 2005 b. cath 10/16/2014 40% prox LAD dx, otherwise minimal CAD  . Arthritis   . History of kidney stones   . Atrial  fibrillation ([place])     ROS:   All systems reviewed and negative except as noted in the HPI.   Past Surgical History  Procedure Laterality Date  . Appendectomy    . Abdominal hysterectomy    . Carotid endarectomy Right     1999  . Pacemaker placement      2006  . Colonoscopy      2006  . Cystoscopy w/ ureteral stent placement Left 04/19/2012    Procedure: CYSTOSCOPY WITH RETROGRADE PYELOGRAM/URETERAL STENT PLACEMENT ;  Surgeon: Ailene Rud, MD;  Location: [facility];  Service: Urology;  Laterality: Left;  . Breast biopsy Bilateral     x 7 total  . Eye surgery Bilateral     Cataract extraction with IOL  . Cystoscopy with retrograde pyelogram, ureteroscopy and stent placement Left 06/30/2012    Procedure:  CYSTOSCOPY WITH LEFT  RETROGRADE PYELOGRAM, URETEROSCOPY  with basketing of stone, AND STENT PLACEMENT, TRANSURETHRAL UNROOFING OF URETER.;  Surgeon: Alexis Frock, MD;  Location: [facility];  Service: Urology;  Laterality: Left;  . Cardiac catheterization N/A 10/16/2014    Procedure: Left Heart Cath and Coronary Angiography;  Surgeon: Leonie Man, MD;  Location: [facility];  Service: Cardiovascular;  Laterality: N/A;     Family History  Problem Relation Age of Onset  . Heart attack Mother     Died age 64  . Hyperlipidemia Mother   . Diabetes Mother   . Coronary artery disease Father   . Alcohol abuse Father   . Hyperlipidemia Father   . Diabetes Sister   . Hypertension Sister   . Breast cancer Sister   . Breast cancer Sister      Social History   Social History  . Marital Status: Married    Spouse Name: N/A  . Number of Children: N/A  . Years of Education: N/A   Occupational History  . Not on file.   Social History Main Topics  . Smoking status: Never Smoker   . Smokeless tobacco: Never Used  . Alcohol Use: No  . Drug Use: No  . Sexual Activity: No   Other Topics Concern  . Not on file   Social History Narrative     BP 138/78 mmHg  Pulse 84  Ht _0  (1.626 m)  Wt 153 lb (69.4 kg)  BMI 26.25 kg/m2  SpO2 94%  Physical Exam:  Well appearing 80 year old woman, NAD HEENT: Unremarkable Neck:  7 cm JVD, no thyromegally Back:  No CVA tenderness Lungs:  Clear with no wheezes, rales, or rhonchi. HEART:  Regular rate rhythm, no murmurs, no rubs, no clicks Abd:  soft, positive bowel sounds, no organomegally, no rebound, no guarding Ext:  2 plus pulses, no edema, no cyanosis, no clubbing Skin:  No rashes no nodules Neuro:  CN II through XII intact, motor grossly intact  DEVICE  Normal device function.  See PaceArt for details.   Assess/Plan:

## 2015-01-09 NOTE — Progress Notes (Signed)
New   Chief Complaint  Patient presents with  . Shoulder Pain    right shoulder pain    HPI Comments: Pain 9 months right shoulder  Fell back in March 2016. X-rays were negative. Now complains of pain right shoulder radiating to right elbow. Pain rated 6 out of 10. No previous treatment. Pain comes and goes not constant  Review of systems stiff joints joint pain neuro symptoms negative constitutional symptoms negative    Shoulder Pain    Past Medical History  Diagnosis Date  . Sick sinus syndrome (HCC)     Medtronic PPM  . Chronic anticoagulation     Followed by PMD  . Hyperlipidemia   . Essential hypertension   . Carotid artery disease ([place])     Right carotid endarectomy 1999  . History of pneumonia     2010  . Glucose intolerance (impaired glucose tolerance)   . Coronary atherosclerosis     a. Minor at cardiac catheterization 2005 b. cath 10/16/2014 40% prox LAD dx, otherwise minimal CAD  . Arthritis   . History of kidney stones   . Atrial fibrillation (HCC)     Ht 5\' 4"  (1.626 m)  Wt 163 lb (73.936 kg)  BMI 27.97 kg/m2 Pulse 68 respirations 20  Mood normal pleasant oriented 3 appearance well-groomed  Right shoulder skin normal lymph nodes negative sensation in the right arm normal good pulse in the radial artery good color normal temperature of the extremity. Mild trapezial neck tenderness no midline neck tenderness  Left shoulder full range of motion normal stability and strength no tenderness or swelling  Right shoulder anterior joint line tenderness rotator interval mild weakness in the empty can position supraspinatus decreased flexion but 150 active motion shoulder stable  Negative Neer sign but positive Hawkins sign  Right shoulder bursitis  Recommend injection  Follow-up if no improvement   Procedure note the subacromial injection shoulder RIGHT  Verbal consent was obtained to inject the  RIGHT   Shoulder  Timeout was completed to confirm the  injection site is a subacromial space of the  RIGHT  shoulder   Medication used Depo-Medrol 40 mg and lidocaine 1% 3 cc  Anesthesia was provided by ethyl chloride  The injection was performed in the RIGHT  posterior subacromial space. After pinning the skin with alcohol and anesthetized the skin with ethyl chloride the subacromial space was injected using a 20-gauge needle. There were no complications  Sterile dressing was applied.

## 2015-01-09 NOTE — Assessment & Plan Note (Signed)
Her medtronic DDD PM is working normally. Will recheck in several months. 

## 2015-01-09 NOTE — Patient Instructions (Signed)
Joint Injection  Care After  Refer to this sheet in the next few days. These instructions provide you with information on caring for yourself after you have had a joint injection. Your caregiver also may give you more specific instructions. Your treatment has been planned according to current medical practices, but problems sometimes occur. Call your caregiver if you have any problems or questions after your procedure.  After any type of joint injection, it is not uncommon to experience:  · Soreness, swelling, or bruising around the injection site.  · Mild numbness, tingling, or weakness around the injection site caused by the numbing medicine used before or with the injection.  It also is possible to experience the following effects associated with the specific agent after injection:  · Iodine-based contrast agents:  ¨ Allergic reaction (itching, hives, widespread redness, and swelling beyond the injection site).  · Corticosteroids (These effects are rare.):  ¨ Allergic reaction.  ¨ Increased blood sugar levels (If you have diabetes and you notice that your blood sugar levels have increased, notify your caregiver).  ¨ Increased blood pressure levels.  ¨ Mood swings.  · Hyaluronic acid in the use of viscosupplementation.  ¨ Temporary heat or redness.  ¨ Temporary rash and itching.  ¨ Increased fluid accumulation in the injected joint.  These effects all should resolve within a day after your procedure.   HOME CARE INSTRUCTIONS  · Limit yourself to light activity the day of your procedure. Avoid lifting heavy objects, bending, stooping, or twisting.  · Take prescription or over-the-counter pain medication as directed by your caregiver.  · You may apply ice to your injection site to reduce pain and swelling the day of your procedure. Ice may be applied 03-04 times:  ¨ Put ice in a plastic bag.  ¨ Place a towel between your skin and the bag.  ¨ Leave the ice on for no longer than 15-20 minutes each time.  SEEK  IMMEDIATE MEDICAL CARE IF:   · Pain and swelling get worse rather than better or extend beyond the injection site.  · Numbness does not go away.  · Blood or fluid continues to leak from the injection site.  · You have chest pain.  · You have swelling of your face or tongue.  · You have trouble breathing or you become dizzy.  · You develop a fever, chills, or severe tenderness at the injection site that last longer than 1 day.  MAKE SURE YOU:  · Understand these instructions.  · Watch your condition.  · Get help right away if you are not doing well or if you get worse.  Document Released: 09/26/2010 Document Revised: 04/07/2011 Document Reviewed: 09/26/2010  ExitCare® Patient Information ©2015 [employer]. This information is not intended to replace advice given to you by your health care provider. Make sure you discuss any questions you have with your health care provider.

## 2015-01-11 ENCOUNTER — Ambulatory Visit: Payer: Medicare Other | Admitting: Orthopedic Surgery

## 2015-01-15 ENCOUNTER — Other Ambulatory Visit: Payer: Self-pay | Admitting: *Deleted

## 2015-01-15 ENCOUNTER — Telehealth: Payer: Self-pay | Admitting: *Deleted

## 2015-01-15 MED ORDER — PREDNISONE 5 MG (48) PO TBPK: ORAL_TABLET | ORAL | Status: DC

## 2015-01-15 NOTE — Telephone Encounter (Signed)
Patient aware that medication has been sent to her pharmacy 

## 2015-01-15 NOTE — Telephone Encounter (Signed)
We can see her the 29th and she can have 5 mg prednisone dose pack 12 days

## 2015-01-15 NOTE — Telephone Encounter (Signed)
Patient is scheduled for 01/25/15 at 9:30. Patient is aware of this appointment.

## 2015-01-15 NOTE — Telephone Encounter (Signed)
Routing to Dr. Harrison to advise 

## 2015-01-15 NOTE — Telephone Encounter (Signed)
Patient called stating she was seen 01/09/15 for her left shoulder, patient said her left shoulder is still hurting and the pain is going down to her arm, patient said the pain has gotten worse, patient wants to know what else can she do. Please advise [phone]

## 2015-01-25 ENCOUNTER — Ambulatory Visit: Payer: Medicare Other | Admitting: Orthopedic Surgery

## 2015-02-07 DIAGNOSIS — R5382 Chronic fatigue, unspecified: Secondary | ICD-10-CM | POA: Diagnosis not present

## 2015-02-07 DIAGNOSIS — R05 Cough: Secondary | ICD-10-CM | POA: Diagnosis not present

## 2015-02-07 DIAGNOSIS — Z7901 Long term (current) use of anticoagulants: Secondary | ICD-10-CM | POA: Diagnosis not present

## 2015-02-07 DIAGNOSIS — J3481 Nasal mucositis (ulcerative): Secondary | ICD-10-CM | POA: Diagnosis not present

## 2015-02-28 IMAGING — NM NM RENAL IMAGING FLOW W/ PHARM
2 series · 12 of 12 positions shown · non-contrast
Comparison: Abdominal pelvic CT 04/17/2012.

CLINICAL DATA: Follow-up hydronephrosis secondary to left ureteral
calculus.  Stent placement 04/19/2012.

NUCLEAR MEDICINE RENAL SCAN WITH DIURETIC ADMINISTRATION
TECHNIQUE: Radionuclide angiographic and sequential renal images
were obtained after intravenous injection of radiopharmaceutical.
Imaging was continued during slow intravenous injection of Lasix
approximately 15 minutes after the start of the examination.
Radiopharmaceutical:  15.3 mCi Kc-XXm MAG3

[Series 1: re renal qualitative · 9.51mm/px · 6 of 110 frames shown (1 of 2)]
[frame 10/110]
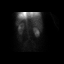
[frame 28/110]
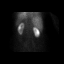
[frame 46/110]
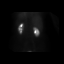
[frame 65/110]
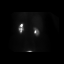
[frame 83/110]
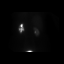
[frame 101/110]
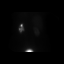

[Series 1: re renal qualitative · 9.51mm/px · 6 of 110 frames shown (2 of 2)]
[frame 10/110]
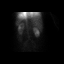
[frame 28/110]
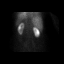
[frame 46/110]
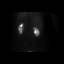
[frame 65/110]
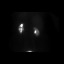
[frame 83/110]
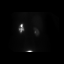
[frame 101/110]
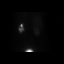

[12 of 12 positions shown; findings below may reference images not displayed]

FINDINGS: Perfusion images demonstrate early fairly symmetric
perfusion to the kidneys.  Differential renal uptake at 2-3 minutes
is 59.7% on the right and 40.3% on the left.

Renogram images demonstrate normal renal cortical uptake and
excretion on the right.  On the left, there is slightly decreased
cortical uptake with delayed opacification of a moderately dilated
pelvocaliceal system.  No significant dilatation of the left ureter
is identified.  Following Lasix and voiding, there is partial
washout of the activity from the dilated left renal collecting
system.

Differential renal function is 57.9% right and 42.1% left.  Post
Lasix T [DATE] times are 9.0 minutes on the right and 12.5 minutes on
the left.
IMPRESSION: 1.  Asymmetric renal function, approximately 58% on the right and
42% on the left.  This corresponds with left cortical thinning on
prior CT.
2.  Persistent moderate left-sided pelvocaliectasis with partial
response to Lasix.  The degree of left ureteral dilatation on prior
CT suggests a degree of chronicity, possibly from a ureterocele or
distal ureteral stricture.

## 2015-03-02 DIAGNOSIS — I119 Hypertensive heart disease without heart failure: Secondary | ICD-10-CM | POA: Diagnosis not present

## 2015-03-02 DIAGNOSIS — M545 Low back pain: Secondary | ICD-10-CM | POA: Diagnosis not present

## 2015-03-02 DIAGNOSIS — R5382 Chronic fatigue, unspecified: Secondary | ICD-10-CM | POA: Diagnosis not present

## 2015-03-02 DIAGNOSIS — M2550 Pain in unspecified joint: Secondary | ICD-10-CM | POA: Diagnosis not present

## 2015-03-06 DIAGNOSIS — Z7901 Long term (current) use of anticoagulants: Secondary | ICD-10-CM | POA: Diagnosis not present

## 2015-03-06 DIAGNOSIS — R1084 Generalized abdominal pain: Secondary | ICD-10-CM | POA: Diagnosis not present

## 2015-03-06 DIAGNOSIS — R112 Nausea with vomiting, unspecified: Secondary | ICD-10-CM | POA: Diagnosis not present

## 2015-03-06 DIAGNOSIS — K769 Liver disease, unspecified: Secondary | ICD-10-CM | POA: Diagnosis not present

## 2015-03-06 DIAGNOSIS — M2550 Pain in unspecified joint: Secondary | ICD-10-CM | POA: Diagnosis not present

## 2015-03-06 DIAGNOSIS — I110 Hypertensive heart disease with heart failure: Secondary | ICD-10-CM | POA: Diagnosis not present

## 2015-03-06 DIAGNOSIS — E748 Other specified disorders of carbohydrate metabolism: Secondary | ICD-10-CM | POA: Diagnosis not present

## 2015-03-09 DIAGNOSIS — R109 Unspecified abdominal pain: Secondary | ICD-10-CM | POA: Diagnosis not present

## 2015-04-10 ENCOUNTER — Telehealth: Payer: Self-pay | Admitting: Cardiology

## 2015-04-10 ENCOUNTER — Encounter: Payer: PPO | Admitting: *Deleted

## 2015-04-10 NOTE — Telephone Encounter (Signed)
LMOVM reminding pt to send remote transmission.   

## 2015-04-11 DIAGNOSIS — R5383 Other fatigue: Secondary | ICD-10-CM | POA: Diagnosis not present

## 2015-04-11 DIAGNOSIS — D510 Vitamin B12 deficiency anemia due to intrinsic factor deficiency: Secondary | ICD-10-CM | POA: Diagnosis not present

## 2015-04-11 DIAGNOSIS — E784 Other hyperlipidemia: Secondary | ICD-10-CM | POA: Diagnosis not present

## 2015-04-11 DIAGNOSIS — I110 Hypertensive heart disease with heart failure: Secondary | ICD-10-CM | POA: Diagnosis not present

## 2015-04-11 DIAGNOSIS — Z7901 Long term (current) use of anticoagulants: Secondary | ICD-10-CM | POA: Diagnosis not present

## 2015-04-13 ENCOUNTER — Encounter: Payer: Self-pay | Admitting: Cardiology

## 2015-04-19 ENCOUNTER — Telehealth: Payer: Self-pay | Admitting: Internal Medicine

## 2015-04-19 ENCOUNTER — Ambulatory Visit (INDEPENDENT_AMBULATORY_CARE_PROVIDER_SITE_OTHER): Payer: PPO | Admitting: *Deleted

## 2015-04-19 DIAGNOSIS — I495 Sick sinus syndrome: Secondary | ICD-10-CM

## 2015-04-19 DIAGNOSIS — Z950 Presence of cardiac pacemaker: Secondary | ICD-10-CM

## 2015-04-19 NOTE — Telephone Encounter (Signed)
Traci [name] is calling because she is needing help with sending her transmission.. Thanks

## 2015-04-19 NOTE — Telephone Encounter (Signed)
Spoke w/ pt and attempted to help her trouble shoot her home monitor. After several attempts transmission unsuccessful. Instructed pt to call tech services. Pt verbalized understanding.

## 2015-04-20 NOTE — Progress Notes (Signed)
Remote pacemaker transmission.   

## 2015-05-01 DIAGNOSIS — M2550 Pain in unspecified joint: Secondary | ICD-10-CM | POA: Diagnosis not present

## 2015-05-01 DIAGNOSIS — I110 Hypertensive heart disease with heart failure: Secondary | ICD-10-CM | POA: Diagnosis not present

## 2015-05-01 DIAGNOSIS — E1165 Type 2 diabetes mellitus with hyperglycemia: Secondary | ICD-10-CM | POA: Diagnosis not present

## 2015-05-01 DIAGNOSIS — E58 Dietary calcium deficiency: Secondary | ICD-10-CM | POA: Diagnosis not present

## 2015-05-01 DIAGNOSIS — R5382 Chronic fatigue, unspecified: Secondary | ICD-10-CM | POA: Diagnosis not present

## 2015-05-01 DIAGNOSIS — E559 Vitamin D deficiency, unspecified: Secondary | ICD-10-CM | POA: Diagnosis not present

## 2015-05-14 ENCOUNTER — Encounter (HOSPITAL_COMMUNITY): Payer: Self-pay

## 2015-05-18 ENCOUNTER — Encounter: Payer: Self-pay | Admitting: Cardiology

## 2015-05-18 LAB — CUP PACEART REMOTE DEVICE CHECK
Battery Impedance: 1313 Ohm
Battery Voltage: 2.75 V
Brady Statistic AP VP Percent: 4 %
Brady Statistic AP VS Percent: 2 %
Implantable Lead Implant Date: 20060525
Implantable Lead Location: 753859
Implantable Lead Location: 753860
Implantable Lead Model: 5076
Implantable Lead Model: 5076
Lead Channel Impedance Value: 478 Ohm
Lead Channel Impedance Value: 573 Ohm
Lead Channel Pacing Threshold Amplitude: 0.5 V
Lead Channel Pacing Threshold Pulse Width: 0.4 ms
Lead Channel Sensing Intrinsic Amplitude: 1 mV
Lead Channel Sensing Intrinsic Amplitude: 11.2 mV
Lead Channel Setting Pacing Pulse Width: 0.4 ms
MDC IDC LEAD IMPLANT DT: 20060525
MDC IDC MSMT BATTERY REMAINING LONGEVITY: 44 mo
MDC IDC MSMT LEADCHNL RV PACING THRESHOLD AMPLITUDE: 0.625 V
MDC IDC MSMT LEADCHNL RV PACING THRESHOLD PULSEWIDTH: 0.4 ms
MDC IDC SESS DTM: 20170323144439
MDC IDC SET LEADCHNL RA PACING AMPLITUDE: 2.75 V
MDC IDC SET LEADCHNL RV PACING AMPLITUDE: 2.5 V
MDC IDC SET LEADCHNL RV SENSING SENSITIVITY: 4 mV
MDC IDC STAT BRADY AS VP PERCENT: 0 %
MDC IDC STAT BRADY AS VS PERCENT: 94 %

## 2015-05-21 ENCOUNTER — Other Ambulatory Visit: Payer: Self-pay | Admitting: Physician Assistant

## 2015-05-21 NOTE — Telephone Encounter (Signed)
Review for refill, Thank you. 

## 2015-07-05 DIAGNOSIS — M545 Low back pain: Secondary | ICD-10-CM | POA: Diagnosis not present

## 2015-07-05 DIAGNOSIS — M2550 Pain in unspecified joint: Secondary | ICD-10-CM | POA: Diagnosis not present

## 2015-07-05 DIAGNOSIS — R5382 Chronic fatigue, unspecified: Secondary | ICD-10-CM | POA: Diagnosis not present

## 2015-07-05 DIAGNOSIS — I110 Hypertensive heart disease with heart failure: Secondary | ICD-10-CM | POA: Diagnosis not present

## 2015-07-19 ENCOUNTER — Telehealth: Payer: Self-pay | Admitting: Cardiology

## 2015-07-19 ENCOUNTER — Ambulatory Visit (INDEPENDENT_AMBULATORY_CARE_PROVIDER_SITE_OTHER): Payer: PPO | Admitting: *Deleted

## 2015-07-19 DIAGNOSIS — Z950 Presence of cardiac pacemaker: Secondary | ICD-10-CM

## 2015-07-19 DIAGNOSIS — I495 Sick sinus syndrome: Secondary | ICD-10-CM | POA: Diagnosis not present

## 2015-07-19 NOTE — Telephone Encounter (Signed)
LMOVM reminding pt to send remote transmission.   

## 2015-07-20 ENCOUNTER — Telehealth: Payer: Self-pay | Admitting: Internal Medicine

## 2015-07-20 NOTE — Telephone Encounter (Signed)
New message  Pt call requesting to speak with RN. Pt wants to make sure her remote check was received. Please call back to discuss

## 2015-07-20 NOTE — Telephone Encounter (Signed)
Informed pt that we did receive her remote transmission. Pt verbalized understanding.  

## 2015-07-20 NOTE — Progress Notes (Signed)
Remote pacemaker transmission.   

## 2015-07-21 LAB — CUP PACEART REMOTE DEVICE CHECK
Battery Remaining Longevity: 43 mo
Brady Statistic AS VS Percent: 93 %
Date Time Interrogation Session: 20170623123929
Implantable Lead Implant Date: 20060525
Implantable Lead Location: 753859
Lead Channel Pacing Threshold Amplitude: 0.75 V
Lead Channel Pacing Threshold Pulse Width: 0.4 ms
Lead Channel Sensing Intrinsic Amplitude: 8 mV
Lead Channel Setting Pacing Amplitude: 2.5 V
Lead Channel Setting Pacing Pulse Width: 0.4 ms
Lead Channel Setting Sensing Sensitivity: 4 mV
MDC IDC LEAD IMPLANT DT: 20060525
MDC IDC LEAD LOCATION: 753860
MDC IDC MSMT BATTERY IMPEDANCE: 1369 Ohm
MDC IDC MSMT BATTERY VOLTAGE: 2.76 V
MDC IDC MSMT LEADCHNL RA IMPEDANCE VALUE: 471 Ohm
MDC IDC MSMT LEADCHNL RA PACING THRESHOLD AMPLITUDE: 0.5 V
MDC IDC MSMT LEADCHNL RA PACING THRESHOLD PULSEWIDTH: 0.4 ms
MDC IDC MSMT LEADCHNL RA SENSING INTR AMPL: 0.7 mV
MDC IDC MSMT LEADCHNL RV IMPEDANCE VALUE: 592 Ohm
MDC IDC SET LEADCHNL RA PACING AMPLITUDE: 2.75 V
MDC IDC STAT BRADY AP VP PERCENT: 5 %
MDC IDC STAT BRADY AP VS PERCENT: 2 %
MDC IDC STAT BRADY AS VP PERCENT: 0 %

## 2015-08-20 DIAGNOSIS — R5382 Chronic fatigue, unspecified: Secondary | ICD-10-CM | POA: Diagnosis not present

## 2015-08-20 DIAGNOSIS — Z7901 Long term (current) use of anticoagulants: Secondary | ICD-10-CM | POA: Diagnosis not present

## 2015-08-20 DIAGNOSIS — I110 Hypertensive heart disease with heart failure: Secondary | ICD-10-CM | POA: Diagnosis not present

## 2015-08-20 DIAGNOSIS — E662 Morbid (severe) obesity with alveolar hypoventilation: Secondary | ICD-10-CM | POA: Diagnosis not present

## 2015-08-28 DIAGNOSIS — Z7901 Long term (current) use of anticoagulants: Secondary | ICD-10-CM | POA: Diagnosis not present

## 2015-08-28 DIAGNOSIS — R5382 Chronic fatigue, unspecified: Secondary | ICD-10-CM | POA: Diagnosis not present

## 2015-08-28 DIAGNOSIS — E1165 Type 2 diabetes mellitus with hyperglycemia: Secondary | ICD-10-CM | POA: Diagnosis not present

## 2015-08-28 DIAGNOSIS — I119 Hypertensive heart disease without heart failure: Secondary | ICD-10-CM | POA: Diagnosis not present

## 2015-09-10 DIAGNOSIS — R5382 Chronic fatigue, unspecified: Secondary | ICD-10-CM | POA: Diagnosis not present

## 2015-09-10 DIAGNOSIS — F418 Other specified anxiety disorders: Secondary | ICD-10-CM | POA: Diagnosis not present

## 2015-09-10 DIAGNOSIS — K219 Gastro-esophageal reflux disease without esophagitis: Secondary | ICD-10-CM | POA: Diagnosis not present

## 2015-09-10 DIAGNOSIS — Z7901 Long term (current) use of anticoagulants: Secondary | ICD-10-CM | POA: Diagnosis not present

## 2015-09-10 DIAGNOSIS — E1165 Type 2 diabetes mellitus with hyperglycemia: Secondary | ICD-10-CM | POA: Diagnosis not present

## 2015-09-17 DIAGNOSIS — R5382 Chronic fatigue, unspecified: Secondary | ICD-10-CM | POA: Diagnosis not present

## 2015-09-17 DIAGNOSIS — I110 Hypertensive heart disease with heart failure: Secondary | ICD-10-CM | POA: Diagnosis not present

## 2015-09-17 DIAGNOSIS — M2550 Pain in unspecified joint: Secondary | ICD-10-CM | POA: Diagnosis not present

## 2015-09-17 DIAGNOSIS — Z0000 Encounter for general adult medical examination without abnormal findings: Secondary | ICD-10-CM | POA: Diagnosis not present

## 2015-10-10 DIAGNOSIS — F39 Unspecified mood [affective] disorder: Secondary | ICD-10-CM | POA: Diagnosis not present

## 2015-10-10 DIAGNOSIS — I10 Essential (primary) hypertension: Secondary | ICD-10-CM | POA: Diagnosis not present

## 2015-10-10 DIAGNOSIS — G4709 Other insomnia: Secondary | ICD-10-CM | POA: Diagnosis not present

## 2015-10-10 DIAGNOSIS — R7301 Impaired fasting glucose: Secondary | ICD-10-CM | POA: Diagnosis not present

## 2015-10-10 DIAGNOSIS — I482 Chronic atrial fibrillation: Secondary | ICD-10-CM | POA: Diagnosis not present

## 2015-10-12 DIAGNOSIS — I10 Essential (primary) hypertension: Secondary | ICD-10-CM | POA: Diagnosis not present

## 2015-10-12 DIAGNOSIS — E559 Vitamin D deficiency, unspecified: Secondary | ICD-10-CM | POA: Diagnosis not present

## 2015-10-12 DIAGNOSIS — R7301 Impaired fasting glucose: Secondary | ICD-10-CM | POA: Diagnosis not present

## 2015-10-22 ENCOUNTER — Encounter: Payer: PPO | Admitting: *Deleted

## 2015-10-22 ENCOUNTER — Telehealth: Payer: Self-pay | Admitting: Cardiology

## 2015-10-22 NOTE — Telephone Encounter (Signed)
LMOVM reminding pt to send remote transmission.   

## 2015-10-26 ENCOUNTER — Encounter: Payer: Self-pay | Admitting: Cardiology

## 2015-11-06 ENCOUNTER — Ambulatory Visit (INDEPENDENT_AMBULATORY_CARE_PROVIDER_SITE_OTHER): Payer: PPO | Admitting: *Deleted

## 2015-11-06 DIAGNOSIS — I495 Sick sinus syndrome: Secondary | ICD-10-CM | POA: Diagnosis not present

## 2015-11-07 ENCOUNTER — Encounter: Payer: Self-pay | Admitting: Cardiology

## 2015-11-07 NOTE — Progress Notes (Signed)
Remote pacemaker transmission.   

## 2015-11-16 DIAGNOSIS — Z23 Encounter for immunization: Secondary | ICD-10-CM | POA: Diagnosis not present

## 2015-11-16 DIAGNOSIS — I482 Chronic atrial fibrillation: Secondary | ICD-10-CM | POA: Diagnosis not present

## 2015-11-16 DIAGNOSIS — F39 Unspecified mood [affective] disorder: Secondary | ICD-10-CM | POA: Diagnosis not present

## 2015-11-16 DIAGNOSIS — I10 Essential (primary) hypertension: Secondary | ICD-10-CM | POA: Diagnosis not present

## 2015-11-16 DIAGNOSIS — Z6824 Body mass index (BMI) 24.0-24.9, adult: Secondary | ICD-10-CM | POA: Diagnosis not present

## 2015-11-16 DIAGNOSIS — G4709 Other insomnia: Secondary | ICD-10-CM | POA: Diagnosis not present

## 2015-11-16 DIAGNOSIS — E1165 Type 2 diabetes mellitus with hyperglycemia: Secondary | ICD-10-CM | POA: Diagnosis not present

## 2015-11-16 DIAGNOSIS — Z0000 Encounter for general adult medical examination without abnormal findings: Secondary | ICD-10-CM | POA: Diagnosis not present

## 2015-12-06 LAB — CUP PACEART REMOTE DEVICE CHECK
Brady Statistic AP VP Percent: 6 %
Brady Statistic AP VS Percent: 2 %
Brady Statistic AS VP Percent: 1 %
Brady Statistic AS VS Percent: 92 %
Date Time Interrogation Session: 20171010145136
Implantable Lead Implant Date: 20060525
Implantable Lead Location: 753859
Implantable Lead Location: 753860
Implantable Pulse Generator Implant Date: 20111215
Lead Channel Impedance Value: 471 Ohm
Lead Channel Impedance Value: 567 Ohm
Lead Channel Pacing Threshold Amplitude: 0.5 V
Lead Channel Pacing Threshold Amplitude: 0.875 V
Lead Channel Pacing Threshold Pulse Width: 0.4 ms
Lead Channel Pacing Threshold Pulse Width: 0.4 ms
MDC IDC LEAD IMPLANT DT: 20060525
MDC IDC MSMT BATTERY IMPEDANCE: 1534 Ohm
MDC IDC MSMT BATTERY REMAINING LONGEVITY: 39 mo
MDC IDC MSMT BATTERY VOLTAGE: 2.75 V
MDC IDC SET LEADCHNL RA PACING AMPLITUDE: 2.75 V
MDC IDC SET LEADCHNL RV PACING AMPLITUDE: 2.5 V
MDC IDC SET LEADCHNL RV PACING PULSEWIDTH: 0.4 ms
MDC IDC SET LEADCHNL RV SENSING SENSITIVITY: 4 mV

## 2015-12-10 DIAGNOSIS — I482 Chronic atrial fibrillation: Secondary | ICD-10-CM | POA: Diagnosis not present

## 2015-12-24 DIAGNOSIS — I482 Chronic atrial fibrillation: Secondary | ICD-10-CM | POA: Diagnosis not present

## 2016-01-31 ENCOUNTER — Encounter: Payer: PPO | Admitting: Internal Medicine

## 2016-02-02 ENCOUNTER — Emergency Department (HOSPITAL_COMMUNITY): Payer: PPO

## 2016-02-02 ENCOUNTER — Encounter (HOSPITAL_COMMUNITY): Payer: Self-pay | Admitting: *Deleted

## 2016-02-02 ENCOUNTER — Inpatient Hospital Stay (HOSPITAL_COMMUNITY)
Admission: EM | Admit: 2016-02-02 | Discharge: 2016-02-08 | DRG: 552 | Disposition: A | Discharge status: Home Health | Payer: PPO | Attending: General Surgery | Admitting: General Surgery

## 2016-02-02 DIAGNOSIS — W19XXXD Unspecified fall, subsequent encounter: Secondary | ICD-10-CM

## 2016-02-02 DIAGNOSIS — I6529 Occlusion and stenosis of unspecified carotid artery: Secondary | ICD-10-CM | POA: Diagnosis present

## 2016-02-02 DIAGNOSIS — E119 Type 2 diabetes mellitus without complications: Secondary | ICD-10-CM | POA: Diagnosis not present

## 2016-02-02 DIAGNOSIS — Z8249 Family history of ischemic heart disease and other diseases of the circulatory system: Secondary | ICD-10-CM

## 2016-02-02 DIAGNOSIS — Z7984 Long term (current) use of oral hypoglycemic drugs: Secondary | ICD-10-CM | POA: Diagnosis not present

## 2016-02-02 DIAGNOSIS — Z79899 Other long term (current) drug therapy: Secondary | ICD-10-CM | POA: Diagnosis not present

## 2016-02-02 DIAGNOSIS — S322XXD Fracture of coccyx, subsequent encounter for fracture with routine healing: Secondary | ICD-10-CM

## 2016-02-02 DIAGNOSIS — Z885 Allergy status to narcotic agent status: Secondary | ICD-10-CM

## 2016-02-02 DIAGNOSIS — D62 Acute posthemorrhagic anemia: Secondary | ICD-10-CM | POA: Diagnosis present

## 2016-02-02 DIAGNOSIS — S300XXA Contusion of lower back and pelvis, initial encounter: Secondary | ICD-10-CM | POA: Diagnosis present

## 2016-02-02 DIAGNOSIS — I2510 Atherosclerotic heart disease of native coronary artery without angina pectoris: Secondary | ICD-10-CM | POA: Diagnosis present

## 2016-02-02 DIAGNOSIS — T80818A Extravasation of other vesicant agent, initial encounter: Secondary | ICD-10-CM | POA: Diagnosis present

## 2016-02-02 DIAGNOSIS — S322XXA Fracture of coccyx, initial encounter for closed fracture: Secondary | ICD-10-CM | POA: Diagnosis not present

## 2016-02-02 DIAGNOSIS — Z8639 Personal history of other endocrine, nutritional and metabolic disease: Secondary | ICD-10-CM | POA: Diagnosis not present

## 2016-02-02 DIAGNOSIS — Y92000 Kitchen of unspecified non-institutional (private) residence as  the place of occurrence of the external cause: Secondary | ICD-10-CM | POA: Diagnosis not present

## 2016-02-02 DIAGNOSIS — Z950 Presence of cardiac pacemaker: Secondary | ICD-10-CM | POA: Diagnosis not present

## 2016-02-02 DIAGNOSIS — W1830XA Fall on same level, unspecified, initial encounter: Secondary | ICD-10-CM | POA: Diagnosis present

## 2016-02-02 DIAGNOSIS — W19XXXA Unspecified fall, initial encounter: Secondary | ICD-10-CM | POA: Diagnosis present

## 2016-02-02 DIAGNOSIS — I10 Essential (primary) hypertension: Secondary | ICD-10-CM | POA: Diagnosis not present

## 2016-02-02 DIAGNOSIS — D72829 Elevated white blood cell count, unspecified: Secondary | ICD-10-CM

## 2016-02-02 DIAGNOSIS — N9489 Other specified conditions associated with female genital organs and menstrual cycle: Secondary | ICD-10-CM

## 2016-02-02 DIAGNOSIS — S3991XA Unspecified injury of abdomen, initial encounter: Secondary | ICD-10-CM | POA: Diagnosis not present

## 2016-02-02 DIAGNOSIS — S37898A Other injury of other urinary and pelvic organ, initial encounter: Secondary | ICD-10-CM | POA: Diagnosis not present

## 2016-02-02 DIAGNOSIS — J90 Pleural effusion, not elsewhere classified: Secondary | ICD-10-CM | POA: Diagnosis not present

## 2016-02-02 DIAGNOSIS — I4891 Unspecified atrial fibrillation: Secondary | ICD-10-CM | POA: Diagnosis present

## 2016-02-02 DIAGNOSIS — Z880 Allergy status to penicillin: Secondary | ICD-10-CM | POA: Diagnosis not present

## 2016-02-02 DIAGNOSIS — R109 Unspecified abdominal pain: Secondary | ICD-10-CM | POA: Diagnosis not present

## 2016-02-02 DIAGNOSIS — M545 Low back pain: Secondary | ICD-10-CM | POA: Diagnosis not present

## 2016-02-02 DIAGNOSIS — Z7901 Long term (current) use of anticoagulants: Secondary | ICD-10-CM | POA: Diagnosis not present

## 2016-02-02 DIAGNOSIS — F329 Major depressive disorder, single episode, unspecified: Secondary | ICD-10-CM | POA: Diagnosis present

## 2016-02-02 HISTORY — DX: Major depressive disorder, single episode, unspecified: F32.9

## 2016-02-02 HISTORY — DX: Depression, unspecified: F32.A

## 2016-02-02 LAB — URINALYSIS, ROUTINE W REFLEX MICROSCOPIC
BILIRUBIN URINE: NEGATIVE
Bacteria, UA: NONE SEEN
GLUCOSE, UA: 150 mg/dL — AB
KETONES UR: 5 mg/dL — AB
LEUKOCYTES UA: NEGATIVE
NITRITE: NEGATIVE
Protein, ur: 30 mg/dL — AB
SPECIFIC GRAVITY, URINE: 1.012 (ref 1.005–1.030)
pH: 6 (ref 5.0–8.0)

## 2016-02-02 LAB — COMPREHENSIVE METABOLIC PANEL
ALBUMIN: 3.7 g/dL (ref 3.5–5.0)
ALK PHOS: 56 U/L (ref 38–126)
ALK PHOS: 50 U/L (ref 38–126)
ALT: 15 U/L (ref 14–54)
ALT: 15 U/L (ref 14–54)
ANION GAP: 13 (ref 5–15)
AST: 21 U/L (ref 15–41)
AST: 20 U/L (ref 15–41)
Albumin: 4.1 g/dL (ref 3.5–5.0)
Anion gap: 13 (ref 5–15)
BUN: 13 mg/dL (ref 6–20)
BUN: 10 mg/dL (ref 6–20)
CALCIUM: 9.2 mg/dL (ref 8.9–10.3)
CHLORIDE: 102 mmol/L (ref 101–111)
CHLORIDE: 102 mmol/L (ref 101–111)
CO2: 23 mmol/L (ref 22–32)
CO2: 22 mmol/L (ref 22–32)
CREATININE: 0.89 mg/dL (ref 0.44–1.00)
Calcium: 8.2 mg/dL — ABNORMAL LOW (ref 8.9–10.3)
Creatinine, Ser: 0.66 mg/dL (ref 0.44–1.00)
GFR calc non Af Amer: >60 mL/min (ref >60)
GLUCOSE: 231 mg/dL — AB (ref 65–99)
Glucose, Bld: 247 mg/dL — ABNORMAL HIGH (ref 65–99)
Potassium: 3.2 mmol/L — ABNORMAL LOW (ref 3.5–5.1)
Potassium: 3.1 mmol/L — ABNORMAL LOW (ref 3.5–5.1)
SODIUM: 137 mmol/L (ref 135–145)
Sodium: 138 mmol/L (ref 135–145)
Total Bilirubin: 0.8 mg/dL (ref 0.3–1.2)
Total Bilirubin: 0.8 mg/dL (ref 0.3–1.2)
Total Protein: 7.6 g/dL (ref 6.5–8.1)
Total Protein: 6.8 g/dL (ref 6.5–8.1)

## 2016-02-02 LAB — TYPE AND SCREEN
ABO/RH(D): A POS
Antibody Screen: NEGATIVE

## 2016-02-02 LAB — CBC WITH DIFFERENTIAL/PLATELET
Basophils Absolute: 0.1 10*3/uL (ref 0.0–0.1)
Basophils Relative: 0 %
EOS PCT: 1 %
Eosinophils Absolute: 0.1 10*3/uL (ref 0.0–0.7)
HCT: 40.4 % (ref 36.0–46.0)
Hemoglobin: 13.4 g/dL (ref 12.0–15.0)
LYMPHS ABS: 2.5 10*3/uL (ref 0.7–4.0)
LYMPHS PCT: 12 %
MCH: 31.5 pg (ref 26.0–34.0)
MCHC: 33.2 g/dL (ref 30.0–36.0)
MCV: 94.8 fL (ref 78.0–100.0)
MONOS PCT: 6 %
Monocytes Absolute: 1.2 10*3/uL — ABNORMAL HIGH (ref 0.1–1.0)
Neutro Abs: 17 10*3/uL — ABNORMAL HIGH (ref 1.7–7.7)
Neutrophils Relative %: 81 %
PLATELETS: 329 10*3/uL (ref 150–400)
RBC: 4.26 MIL/uL (ref 3.87–5.11)
RDW: 14.2 % (ref 11.5–15.5)
WBC: 20.9 10*3/uL — AB (ref 4.0–10.5)

## 2016-02-02 LAB — PROTIME-INR
INR: 3.85
INR: 1.51
Prothrombin Time: 38.8 seconds — ABNORMAL HIGH (ref 11.4–15.2)
Prothrombin Time: 18.4 seconds — ABNORMAL HIGH (ref 11.4–15.2)

## 2016-02-02 LAB — CBC
HCT: 34.7 % — ABNORMAL LOW (ref 36.0–46.0)
Hemoglobin: 11.9 g/dL — ABNORMAL LOW (ref 12.0–15.0)
MCH: 31.4 pg (ref 26.0–34.0)
MCHC: 34.3 g/dL (ref 30.0–36.0)
MCV: 91.6 fL (ref 78.0–100.0)
Platelets: 284 10*3/uL (ref 150–400)
RBC: 3.79 MIL/uL — AB (ref 3.87–5.11)
RDW: 13.9 % (ref 11.5–15.5)
WBC: 18.9 10*3/uL — ABNORMAL HIGH (ref 4.0–10.5)

## 2016-02-02 MED ORDER — IOPAMIDOL (ISOVUE-300) INJECTION 61%
100.0000 mL | Freq: Once | INTRAVENOUS | Status: AC | PRN
  Administered 2016-02-02: 100 mL via INTRAVENOUS

## 2016-02-02 MED ORDER — VITAMIN K1 10 MG/ML IJ SOLN
10.0000 mg | INTRAMUSCULAR | Status: AC
  Administered 2016-02-02: 10 mg via INTRAVENOUS
  Filled 2016-02-02: qty 1

## 2016-02-02 MED ORDER — HYDROMORPHONE HCL 1 MG/ML IJ SOLN
1.0000 mg | Freq: Once | INTRAMUSCULAR | Status: AC
  Administered 2016-02-02: 1 mg via INTRAVENOUS

## 2016-02-02 MED ORDER — SODIUM CHLORIDE 0.9 % IV BOLUS (SEPSIS)
1000.0000 mL | Freq: Once | INTRAVENOUS | Status: AC
  Administered 2016-02-02: 1000 mL via INTRAVENOUS

## 2016-02-02 MED ORDER — VITAMIN K1 10 MG/ML IJ SOLN
INTRAMUSCULAR | Status: AC
  Filled 2016-02-02: qty 1

## 2016-02-02 MED ORDER — HYDROMORPHONE HCL 1 MG/ML IJ SOLN
1.0000 mg | Freq: Once | INTRAMUSCULAR | Status: DC
Start: 2016-02-02 — End: 2016-02-02

## 2016-02-02 MED ORDER — HYDROMORPHONE HCL 1 MG/ML IJ SOLN
INTRAMUSCULAR | Status: AC
  Filled 2016-02-02: qty 1

## 2016-02-02 MED ORDER — HYDROMORPHONE HCL 1 MG/ML IJ SOLN
0.5000 mg | Freq: Once | INTRAMUSCULAR | Status: AC
  Administered 2016-02-02: 0.5 mg via INTRAVENOUS
  Filled 2016-02-02: qty 1

## 2016-02-02 MED ORDER — ONDANSETRON HCL 4 MG/2ML IJ SOLN
4.0000 mg | Freq: Once | INTRAMUSCULAR | Status: AC
  Administered 2016-02-02: 4 mg via INTRAVENOUS

## 2016-02-02 MED ORDER — ONDANSETRON HCL 4 MG/2ML IJ SOLN
INTRAMUSCULAR | Status: AC
  Administered 2016-02-02: 4 mg via INTRAVENOUS
  Filled 2016-02-02: qty 2

## 2016-02-02 MED ORDER — FENTANYL CITRATE (PF) 100 MCG/2ML IJ SOLN
50.0000 ug | Freq: Once | INTRAMUSCULAR | Status: AC
  Administered 2016-02-02 – 2016-02-03 (×2): 50 ug via INTRAVENOUS
  Filled 2016-02-02: qty 2

## 2016-02-02 MED ORDER — PROTHROMBIN COMPLEX CONC HUMAN 500 UNITS IV KIT
25.0000 [IU]/kg | PACK | Status: AC
  Administered 2016-02-02: 1650 [IU] via INTRAVENOUS
  Filled 2016-02-02: qty 66

## 2016-02-02 MED ORDER — ONDANSETRON HCL 4 MG/2ML IJ SOLN: 4.0000 mg | Freq: Once | INTRAMUSCULAR | Status: DC

## 2016-02-02 NOTE — ED Notes (Signed)
The pt was transferred here from [place] ed.  The pt fell this am and fractured her sacrum and coccyx.   She has been at [facility] since 1700 today iv rt wrist fol;ey cath placed there.  Bruising around her rectum .  The pt is not complaining of pain anywhere even after dr Georgette Dover did a rectal exam  Family at the bedside

## 2016-02-02 NOTE — ED Notes (Signed)
Pt assisted to bedside commode several times without being able to obtain urine sample, Dr Roderic Palau notified, advised that he would be in to assess pt,

## 2016-02-02 NOTE — ED Notes (Signed)
The pt knows where she is does not know the day of the  Week  She does know the month and she reports that she never knows the year or the president

## 2016-02-02 NOTE — ED Triage Notes (Signed)
Pt was at home and fell this morning c/o pain to buttocks area. No LOC upon fall.

## 2016-02-02 NOTE — ED Notes (Signed)
[facility] here to transport pt,  

## 2016-02-02 NOTE — ED Notes (Signed)
Patient transported to CT 

## 2016-02-02 NOTE — ED Notes (Signed)
Two gold colored rings removed from pt's hand's, given to [employer] at bedside per pt and family request,

## 2016-02-02 NOTE — H&P (Signed)
History   Traci [name] is an 82 y.o. female.   Chief Complaint:  Chief Complaint  Patient presents with  . Fall   Transfer from [place] PCP - Delphina Cahill  HPI This is a 82 yo female with multiple medical issues who is anticoagulated on Coumadin for atrial fibrillation who apparently fell down on her buttocks in her kitchen this morning.  The fall was not witnessed.  Family members helped her up.  This occurred about 9 AM.  About five hours later, she was complaining of some pain in her tailbone, so her family brought her to [facility] ED.  Plain films were unremarkable, but a CT scan showed a large pelvic hematoma and a non-displaced coccygeal fracture.  There was active contrast extravasation in the hematoma.  The EDP called to transfer the placement but it took over two hours for the patient to arrive.  She was given Interior and spatial designer at [facility] for an INR of 3.85.  She has been hemodynamically stable throughout.  She was given some pain medication and is now asymptomatic.    She is a very poor historian and may have some mild dementia.  Past Medical History:  Diagnosis Date  . Arthritis   . Atrial fibrillation ([place])   . Carotid artery disease ([place])    Right carotid endarectomy 1999  . Chronic anticoagulation    Followed by PMD  . Coronary atherosclerosis    a. Minor at cardiac catheterization 2005 b. cath 10/16/2014 40% prox LAD dx, otherwise minimal CAD  . Depression   . Essential hypertension   . Glucose intolerance (impaired glucose tolerance)   . History of kidney stones   . History of pneumonia    2010  . Hyperlipidemia   . Sick sinus syndrome [facility])    [employer] PPM    Past Surgical History:  Procedure Laterality Date  . ABDOMINAL HYSTERECTOMY    . APPENDECTOMY    . BREAST BIOPSY Bilateral    x 7 total  . CARDIAC CATHETERIZATION N/A 10/16/2014   Procedure: Left Heart Cath and Coronary Angiography;  Surgeon: Leonie Man, MD;  Location: [place] [facility];  Service: Cardiovascular;  Laterality: N/A;  . Carotid endarectomy Right    1999  . COLONOSCOPY     2006  . CYSTOSCOPY W/ URETERAL STENT PLACEMENT Left 04/19/2012   Procedure: CYSTOSCOPY WITH RETROGRADE PYELOGRAM/URETERAL STENT PLACEMENT ;  Surgeon: Ailene Rud, MD;  Location: [facility];  Service: Urology;  Laterality: Left;  . CYSTOSCOPY WITH RETROGRADE PYELOGRAM, URETEROSCOPY AND STENT PLACEMENT Left 06/30/2012   Procedure: CYSTOSCOPY WITH LEFT  RETROGRADE PYELOGRAM, URETEROSCOPY  with basketing of stone, AND STENT PLACEMENT, TRANSURETHRAL UNROOFING OF URETER.;  Surgeon: Alexis Frock, MD;  Location: [facility];  Service: Urology;  Laterality: Left;  . EYE SURGERY Bilateral    Cataract extraction with IOL  . PACEMAKER PLACEMENT     2006    Family History  Problem Relation Age of Onset  . Heart attack Mother     Died age 29  . Hyperlipidemia Mother   . Diabetes Mother   . Coronary artery disease Father   . Alcohol abuse Father   . Hyperlipidemia Father   . Diabetes Sister   . Hypertension Sister   . Breast cancer Sister   . Breast cancer Sister    Social History:  reports that she has never smoked. She has never used smokeless tobacco. She reports that she does  not drink alcohol or use drugs.  Allergies   Allergies  Allergen Reactions  . Morphine Nausea Only  . Penicillins Other (See Comments)    Has patient had a PCN reaction causing immediate rash, facial/tongue/throat swelling, SOB or lightheadedness with hypotension: NO Has patient had a PCN reaction causing severe rash involving mucus membranes or skin necrosis: no Has patient had a PCN reaction that required hospitalization: NO Has patient had a PCN reaction occurring within the last 10 years: NO If all of the above answers are "NO", then may proceed with Cephalosporin use.     Home Medications   Prior to Admission medications   Medication Sig Start Date End Date Taking? Authorizing Provider   amLODipine (NORVASC) 5 MG tablet Take 1 tablet (5 mg total) by mouth daily. Patient taking differently: Take 5 mg by mouth every morning.  11/08/13  Yes Herminio Commons, MD  citalopram (CELEXA) 40 MG tablet Take 40 mg by mouth every morning.   Yes Historical Provider, MD  digoxin (LANOXIN) 0.125 MG tablet Take 1 tablet (125 mcg total) by mouth every morning. 06/06/13  Yes Alycia Rossetti, MD  escitalopram (LEXAPRO) 10 MG tablet Take 10 mg by mouth at bedtime.   Yes Historical Provider, MD  LORazepam (ATIVAN) 0.5 MG tablet Take 0.5 mg by mouth at bedtime.   Yes Historical Provider, MD  losartan (COZAAR) 50 MG tablet TAKE 1 TABLET(50 MG) BY MOUTH TWICE DAILY Patient taking differently: TAKE 1 TABLET(50 MG) BY MOUTH ONCE DAILY IN THE EVENING 05/22/15  Yes Evans Lance, MD  metFORMIN (GLUCOPHAGE) 500 MG tablet Take 500 mg by mouth 2 (two) times daily with a meal.   Yes Historical Provider, MD  metoprolol tartrate (LOPRESSOR) 25 MG tablet Take 25 mg by mouth 2 (two) times daily.   Yes Historical Provider, MD  warfarin (COUMADIN) 5 MG tablet Take 2.5-5 mg by mouth every morning. 70m on MWF and 2.565mon all other days 06/06/13  Yes KaAlycia RossettiMD  Blood Glucose Monitoring Suppl (BLOOD GLUCOSE METER KIT AND SUPPLIES) Dispense based on patient and insurance preference. Use to monitor FSBS one time daily as directed. DX: 250.00 09/05/13   KaAlycia RossettiMD     Trauma Course   Results for orders placed or performed during the hospital encounter of 02/02/16 (from the past 48 hour(s))  CBC with Differential/Platelet     Status: Abnormal   Collection Time: 02/02/16  5:39 PM  Result Value Ref Range   WBC 20.9 (H) 4.0 - 10.5 K/uL   RBC 4.26 3.87 - 5.11 MIL/uL   Hemoglobin 13.4 12.0 - 15.0 g/dL   HCT 40.4 36.0 - 46.0 %   MCV 94.8 78.0 - 100.0 fL   MCH 31.5 26.0 - 34.0 pg   MCHC 33.2 30.0 - 36.0 g/dL   RDW 14.2 11.5 - 15.5 %   Platelets 329 150 - 400 K/uL   Neutrophils Relative % 81 %    Neutro Abs 17.0 (H) 1.7 - 7.7 K/uL   Lymphocytes Relative 12 %   Lymphs Abs 2.5 0.7 - 4.0 K/uL   Monocytes Relative 6 %   Monocytes Absolute 1.2 (H) 0.1 - 1.0 K/uL   Eosinophils Relative 1 %   Eosinophils Absolute 0.1 0.0 - 0.7 K/uL   Basophils Relative 0 %   Basophils Absolute 0.1 0.0 - 0.1 K/uL  Comprehensive metabolic panel     Status: Abnormal   Collection Time: 02/02/16  5:39 PM  Result Value Ref Range   Sodium 138 135 - 145 mmol/L   Potassium 3.2 (L) 3.5 - 5.1 mmol/L   Chloride 102 101 - 111 mmol/L   CO2 23 22 - 32 mmol/L   Glucose, Bld 247 (H) 65 - 99 mg/dL   BUN 13 6 - 20 mg/dL   Creatinine, Ser 0.89 0.44 - 1.00 mg/dL   Calcium 9.2 8.9 - 10.3 mg/dL   Total Protein 7.6 6.5 - 8.1 g/dL   Albumin 4.1 3.5 - 5.0 g/dL   AST 21 15 - 41 U/L   ALT 15 14 - 54 U/L   Alkaline Phosphatase 56 38 - 126 U/L   Total Bilirubin 0.8 0.3 - 1.2 mg/dL   GFR calc non Af Amer >60 >60 mL/min   GFR calc Af Amer >60 >60 mL/min    Comment: (NOTE) The eGFR has been calculated using the CKD EPI equation. This calculation has not been validated in all clinical situations. eGFR's persistently <60 mL/min signify possible Chronic Kidney Disease.    Anion gap 13 5 - 15  Protime-INR     Status: Abnormal   Collection Time: 02/02/16  5:39 PM  Result Value Ref Range   Prothrombin Time 38.8 (H) 11.4 - 15.2 seconds   INR 3.85   Urinalysis, Routine w reflex microscopic     Status: Abnormal   Collection Time: 02/02/16  7:49 PM  Result Value Ref Range   Color, Urine YELLOW YELLOW   APPearance CLOUDY (A) CLEAR   Specific Gravity, Urine 1.012 1.005 - 1.030   pH 6.0 5.0 - 8.0   Glucose, UA 150 (A) NEGATIVE mg/dL   Hgb urine dipstick SMALL (A) NEGATIVE   Bilirubin Urine NEGATIVE NEGATIVE   Ketones, ur 5 (A) NEGATIVE mg/dL   Protein, ur 30 (A) NEGATIVE mg/dL   Nitrite NEGATIVE NEGATIVE   Leukocytes, UA NEGATIVE NEGATIVE   RBC / HPF TOO NUMEROUS TO COUNT 0 - 5 RBC/hpf   WBC, UA TOO NUMEROUS TO COUNT 0 - 5  WBC/hpf   Bacteria, UA NONE SEEN NONE SEEN   Mucous PRESENT    Budding Yeast PRESENT    Ca Oxalate Crys, UA PRESENT    Dg Lumbar Spine Complete  Result Date: 02/02/2016 CLINICAL DATA:  82 year old with low back pain after she slipped and fell on her floor at home earlier today. Initial encounter. EXAM: LUMBAR SPINE - COMPLETE 4+ VIEW COMPARISON:  Bone window images from CT abdomen and pelvis 04/17/2012. FINDINGS: Five non-rib-bearing lumbar vertebrae with anatomic alignment. No fractures. Disc space narrowing at every lumbar level, worst at L4-5 and L5-S1. Osseous demineralization. Facet degenerative changes at L4-5 and L5-S1. Sacroiliac joints intact. Aortoiliac atherosclerosis without aneurysm. IMPRESSION: 1. No acute osseous abnormality. 2. Multilevel degenerative disc disease, worst at L4-5 and L5-S1. 3. Facet degenerative changes at L4-5 and L5-S1. 4. Aortoiliac atherosclerosis without aneurysm. Electronically Signed   By: Evangeline Dakin M.D.   On: 02/02/2016 18:16   Ct Abdomen Pelvis W Contrast  Addendum Date: 02/02/2016   ADDENDUM REPORT: 02/02/2016 21:09 ADDENDUM: The hematoma is in the posterior pelvis anterior to the sacrum and coccyx. It is impressing upon the rectum and lower sigmoid colon prominence location in the posterior pelvis. It is separate from and does not involve the gluteal muscles. Electronically Signed   By: Lowella Grip III M.D.   On: 02/02/2016 21:09   Result Date: 02/02/2016 CLINICAL DATA:  Pain following fall EXAM: CT ABDOMEN AND PELVIS WITH CONTRAST TECHNIQUE: Multidetector  CT imaging of the abdomen and pelvis was performed using the standard protocol following bolus administration of intravenous contrast. CONTRAST:  167m ISOVUE-300 IOPAMIDOL (ISOVUE-300) INJECTION 61% COMPARISON:  April 17, 2012 FINDINGS: Lower chest: There is atelectatic change in the left base with questionable mild consolidation in the posterior segment region. There is a small left pleural  effusion. There is cardiomegaly. Pacemaker lead is attached to the right ventricle. Visualized pericardium is not thickened. Hepatobiliary: There is hepatic steatosis. No focal liver lesion evident. Liver appears intact without laceration or rupture. No perihepatic fluid. Gallbladder wall is not appreciably thickened. There is no biliary duct dilatation. Pancreas: No pancreatic mass or inflammatory focus. No pancreatic laceration or rupture. Spleen: Spleen appears intact without laceration or rupture. No splenic lesions are evident. No perisplenic fluid. Adrenals/Urinary Tract: Right adrenal appears normal. There is a left adrenal mass measuring 1.2 x 1.2 cm, also present on prior study. There are no appreciable renal masses on either side. There is no renal laceration or rupture. There is chronic hydronephrosis in ureterectasis on the left. There is a calculus in the left kidney measuring 8 x 6 mm. A second calculus measures 7 x 5 mm in the left kidney. There is no renal or ureteral calculus on the right. There is chronic thickening of the urinary bladder wall, particularly near the ureterovesical junction on the left. Stomach/Bowel: The rectum is compressed and deviated by a large masslike area measuring 8.1 x 8.0 x 9.4 cm. Within this lesion, there is an area of apparent arterial bleeding consistent with an acute hematoma. There is inflammation involving the distal sigmoid colon and upper rectum with stranding in the mesenteric in this area as well as localized fluid. Loculated ascites tracks throughout the posterior pelvis and retroperitoneum to the level of the aortic bifurcation. Elsewhere, there is no bowel wall or mesenteric thickening. No bowel obstruction. No free air or portal venous air. Vascular/Lymphatic: There is extensive atherosclerotic calcification in the aorta and iliac arteries. There is also calcification in both hypogastric arteries. There is no abdominal aortic aneurysm. Major mesenteric  vessels appear patent. No adenopathy is evident in the abdomen or pelvis. Reproductive: Uterus is absent. Other: Appendix absent. No abscess. There is a small ventral hernia containing only fat. Musculoskeletal: There is evidence of an old healed fracture of the right ischium. There is a fracture in the coccyx near the large posterior pelvic hematoma. No other fracture is evident. There is degenerative change in the lumbar spine. No blastic or lytic bone lesions are evident. There is no intramuscular or abdominal wall lesion. IMPRESSION: Large apparent hematoma with active arterial bleeding within this hematoma arising from an arterial vessel posteriorly. This apparent hematoma measures 8.1 x 8.0 x 9.4 cm. It displaces the rectum and lower sigmoid colon. There is fluid consistent with loculated ascites in the pelvis tracking upward into the retroperitoneum to the level of the aortic bifurcation. There is inflammation of the distal sigmoid colon with stranding in the soft tissues in this area. Nondisplaced coccyx fracture. Old fracture with healing right ischium. No bowel obstruction.  No abscess. Chronic hydronephrosis and ureterectasis on the left with urinary bladder wall thickening. Question chronic stenosis at the ureterovesical junction on the left. This finding may warrant cystoscopic evaluation to further assess. Nonobstructing calculi left kidney. Hepatic steatosis. Small left adrenal mass, stable. Cardiomegaly. Atelectasis with questionable small focus of pneumonia left base. There is extensive aortoiliac atherosclerosis. These results were called by telephone at the time of interpretation  on 02/02/2016 at 8:40 pm to Dr. Milton Ferguson , who verbally acknowledged these results. These results were called by telephone at the time of interpretation on 02/02/2016 at 8:40 pm to Dr. Milton Ferguson , who verbally acknowledged these results. Electronically Signed: By: Lowella Grip III M.D. On: 02/02/2016 20:40     Review of Systems  Constitutional: Negative for weight loss.  HENT: Negative for ear discharge, ear pain, hearing loss and tinnitus.   Eyes: Negative for blurred vision, double vision, photophobia and pain.  Respiratory: Negative for cough, sputum production and shortness of breath.   Cardiovascular: Negative for chest pain.  Gastrointestinal: Negative for abdominal pain, nausea and vomiting.  Genitourinary: Negative for dysuria, flank pain, frequency and urgency.  Musculoskeletal: Positive for back pain and falls. Negative for joint pain, myalgias and neck pain.  Neurological: Positive for weakness. Negative for dizziness, tingling, sensory change, focal weakness, loss of consciousness and headaches.  Endo/Heme/Allergies: Does not bruise/bleed easily.  Psychiatric/Behavioral: Positive for memory loss. Negative for depression and substance abuse. The patient is not nervous/anxious.     Blood pressure (!) 201/95, pulse 71, temperature 97.5 F (36.4 C), resp. rate 22, height '5\' 5"'  (1.651 m), weight 65.8 kg (145 lb), SpO2 99 %. Physical Exam  Constitutional: She is oriented to person, place, and time. She appears well-developed.  HENT:  Head: Normocephalic.  Eyes: Conjunctivae and EOM are normal. No scleral icterus.  Neck: Neck supple. No thyromegaly present.  Cardiovascular: Normal rate and regular rhythm.  Exam reveals no gallop and no friction rub.   No murmur heard. Pulmonary/Chest: No stridor. She has no wheezes. She has no rales. She exhibits no tenderness.  Abdominal: She exhibits no distension. There is no tenderness. There is no rebound.  Rectal:  Mildly enlarged external hemorrhoids - non-tender;  Adjacent to the anus bilaterally there are two areas of deep ecchymosis, each measuring about 4 x 5 cm with minimal swelling. Musculoskeletal: Normal range of motion. She exhibits tenderness. She exhibits no edema.  Tenderness to palpation over coccyx Lymphadenopathy:    She has  no cervical adenopathy.  Neurological: She is oriented to person, place, and time. She exhibits normal muscle tone. Coordination normal.  Poor memory for events of today. Skin: No rash noted. No erythema.  Psychiatric: She has a normal mood and affect. Her behavior is normal.   Assessment/Plan Fall Non-displaced coccygeal fracture Pelvic hematoma with active contrast extravasation 8.1 x 8.0 x 9.4 cm.  Displacement of rectum.  No sign of obstruction or free air.   Anticoagulated - reversed with [facility] Coronary artery disease/ atrial fibrillation/ pacemaker  Plan: Discussed with IR - Dr. Reesa Chew who examined the CT scan.  No indications for acute intervention, as the patient has been stable Recheck labs Admit to ICU - [facility] - reverse INR as needed Bedrest for now May need to consult cardiology for chronic management, but no acute cardiac issues.  Discussed with family at bedside.    Aarron Wierzbicki K. 02/02/2016, 11:08 PM   Procedures

## 2016-02-02 NOTE — ED Notes (Signed)
ED Provider at bedside. 

## 2016-02-02 NOTE — ED Provider Notes (Signed)
[facility] DEPT Provider Note   CSN: [id number] Arrival date & time: 02/02/16  1714     History   Chief Complaint Chief Complaint  Patient presents with  . Fall    HPI KRISTAN BRUMMITT is a 82 y.o. female.  Patient fell on her coccyx and is complaining of severe pain in her lower spine.   The history is provided by the patient.  Fall  This is a new problem. The current episode started 3 to 5 hours ago. The problem occurs constantly. The problem has not changed since onset.Pertinent negatives include no chest pain, no abdominal pain and no headaches. Exacerbated by: movement. Nothing relieves the symptoms. She has tried nothing for the symptoms.    Past Medical History:  Diagnosis Date  . Arthritis   . Atrial fibrillation ([place])   . Carotid artery disease ([place])    Right carotid endarectomy 1999  . Chronic anticoagulation    Followed by PMD  . Coronary atherosclerosis    a. Minor at cardiac catheterization 2005 b. cath 10/16/2014 40% prox LAD dx, otherwise minimal CAD  . Depression   . Essential hypertension   . Glucose intolerance (impaired glucose tolerance)   . History of kidney stones   . History of pneumonia    2010  . Hyperlipidemia   . Sick sinus syndrome [facility])    [employer] PPM    Patient Active Problem List   Diagnosis Date Noted  . Chest pain at rest 10/17/2014  . Chronic diastolic heart failure ([place]) 10/14/2014  . Peripheral neuropathy ([facility]) 08/31/2013  . Leg swelling 08/31/2013  . Encounter for monitoring coumadin therapy 04/06/2013  . CAD (coronary artery disease) 04/06/2013  . OA (osteoarthritis) 04/06/2013  . Other malaise and fatigue 04/06/2013  . Essential hypertension, benign 04/06/2013  . Atrial fibrillation ([place]) 06/11/2010  . HYPERLIPIDEMIA 02/26/2010  . Cardiac pacemaker in situ 06/13/2009    Past Surgical History:  Procedure Laterality Date  . ABDOMINAL HYSTERECTOMY    . APPENDECTOMY    . BREAST BIOPSY Bilateral    x 7 total  .  CARDIAC CATHETERIZATION N/A 10/16/2014   Procedure: Left Heart Cath and Coronary Angiography;  Surgeon: Leonie Man, MD;  Location: [facility];  Service: Cardiovascular;  Laterality: N/A;  . Carotid endarectomy Right    1999  . COLONOSCOPY     2006  . CYSTOSCOPY W/ URETERAL STENT PLACEMENT Left 04/19/2012   Procedure: CYSTOSCOPY WITH RETROGRADE PYELOGRAM/URETERAL STENT PLACEMENT ;  Surgeon: Ailene Rud, MD;  Location: [facility];  Service: Urology;  Laterality: Left;  . CYSTOSCOPY WITH RETROGRADE PYELOGRAM, URETEROSCOPY AND STENT PLACEMENT Left 06/30/2012   Procedure: CYSTOSCOPY WITH LEFT  RETROGRADE PYELOGRAM, URETEROSCOPY  with basketing of stone, AND STENT PLACEMENT, TRANSURETHRAL UNROOFING OF URETER.;  Surgeon: Alexis Frock, MD;  Location: [facility];  Service: Urology;  Laterality: Left;  . EYE SURGERY Bilateral    Cataract extraction with IOL  . PACEMAKER PLACEMENT     2006    OB History    No data available       Home Medications    Prior to Admission medications   Medication Sig Start Date End Date Taking? Authorizing Provider  amLODipine (NORVASC) 5 MG tablet Take 1 tablet (5 mg total) by mouth daily. Patient taking differently: Take 5 mg by mouth every morning.  11/08/13  Yes Herminio Commons, MD  citalopram (CELEXA) 40 MG tablet Take 40 mg by mouth every morning.   Yes Historical Provider,  MD  digoxin (LANOXIN) 0.125 MG tablet Take 1 tablet (125 mcg total) by mouth every morning. 06/06/13  Yes Alycia Rossetti, MD  escitalopram (LEXAPRO) 10 MG tablet Take 10 mg by mouth at bedtime.   Yes Historical Provider, MD  LORazepam (ATIVAN) 0.5 MG tablet Take 0.5 mg by mouth at bedtime.   Yes Historical Provider, MD  losartan (COZAAR) 50 MG tablet TAKE 1 TABLET(50 MG) BY MOUTH TWICE DAILY Patient taking differently: TAKE 1 TABLET(50 MG) BY MOUTH ONCE DAILY IN THE EVENING 05/22/15  Yes Evans Lance, MD  metFORMIN (GLUCOPHAGE) 500 MG tablet Take 500 mg by mouth 2 (two)  times daily with a meal.   Yes Historical Provider, MD  metoprolol tartrate (LOPRESSOR) 25 MG tablet Take 25 mg by mouth 2 (two) times daily.   Yes Historical Provider, MD  warfarin (COUMADIN) 5 MG tablet Take 2.5-5 mg by mouth every morning. 83m on MWF and 2.568mon all other days 06/06/13  Yes KaAlycia RossettiMD  Blood Glucose Monitoring Suppl (BLOOD GLUCOSE METER KIT AND SUPPLIES) Dispense based on patient and insurance preference. Use to monitor FSBS one time daily as directed. DX: 250.00 09/05/13   KaAlycia RossettiMD    Family History Family History  Problem Relation Age of Onset  . Heart attack Mother     Died age 80. Hyperlipidemia Mother   . Diabetes Mother   . Coronary artery disease Father   . Alcohol abuse Father   . Hyperlipidemia Father   . Diabetes Sister   . Hypertension Sister   . Breast cancer Sister   . Breast cancer Sister     Social History Social History  Substance Use Topics  . Smoking status: Never Smoker  . Smokeless tobacco: Never Used  . Alcohol use No     Allergies   Morphine and Penicillins   Review of Systems Review of Systems  Constitutional: Negative for appetite change and fatigue.  HENT: Negative for congestion, ear discharge and sinus pressure.   Eyes: Negative for discharge.  Respiratory: Negative for cough.   Cardiovascular: Negative for chest pain.  Gastrointestinal: Negative for abdominal pain and diarrhea.  Genitourinary: Negative for frequency and hematuria.  Musculoskeletal: Positive for back pain.  Skin: Negative for rash.  Neurological: Negative for seizures and headaches.  Psychiatric/Behavioral: Negative for hallucinations.     Physical Exam Updated Vital Signs BP (!) 202/107   Pulse 71   Temp 97.9 F (36.6 C) (Oral)   Resp 22   Ht _0  (1.651 m)   Wt 145 lb (65.8 kg)   SpO2 99%   BMI 24.13 kg/m   Physical Exam  Constitutional: She is oriented to person, place, and time. She appears well-developed.    HENT:  Head: Normocephalic.  Eyes: Conjunctivae and EOM are normal. No scleral icterus.  Neck: Neck supple. No thyromegaly present.  Cardiovascular: Normal rate and regular rhythm.  Exam reveals no gallop and no friction rub.   No murmur heard. Pulmonary/Chest: No stridor. She has no wheezes. She has no rales. She exhibits no tenderness.  Abdominal: She exhibits no distension. There is no tenderness. There is no rebound.  Musculoskeletal: Normal range of motion. She exhibits tenderness. She exhibits no edema.  Tenderness to lower thoracic spine and sacrum  Lymphadenopathy:    She has no cervical adenopathy.  Neurological: She is oriented to person, place, and time. She exhibits normal muscle tone. Coordination normal.  Skin: No rash noted. No  erythema.  Psychiatric: She has a normal mood and affect. Her behavior is normal.     ED Treatments / Results  Labs (all labs ordered are listed, but only abnormal results are displayed) Labs Reviewed  CBC WITH DIFFERENTIAL/PLATELET - Abnormal; Notable for the following:       Result Value   WBC 20.9 (*)    Neutro Abs 17.0 (*)    Monocytes Absolute 1.2 (*)    All other components within normal limits  COMPREHENSIVE METABOLIC PANEL - Abnormal; Notable for the following:    Potassium 3.2 (*)    Glucose, Bld 247 (*)    All other components within normal limits  URINALYSIS, ROUTINE W REFLEX MICROSCOPIC - Abnormal; Notable for the following:    APPearance CLOUDY (*)    Glucose, UA 150 (*)    Hgb urine dipstick SMALL (*)    Ketones, ur 5 (*)    Protein, ur 30 (*)    All other components within normal limits  PROTIME-INR    EKG  EKG Interpretation None       Radiology Dg Lumbar Spine Complete  Result Date: 02/02/2016 CLINICAL DATA:  82 year old with low back pain after she slipped and fell on her floor at home earlier today. Initial encounter. EXAM: LUMBAR SPINE - COMPLETE 4+ VIEW COMPARISON:  Bone window images from CT abdomen and  pelvis 04/17/2012. FINDINGS: Five non-rib-bearing lumbar vertebrae with anatomic alignment. No fractures. Disc space narrowing at every lumbar level, worst at L4-5 and L5-S1. Osseous demineralization. Facet degenerative changes at L4-5 and L5-S1. Sacroiliac joints intact. Aortoiliac atherosclerosis without aneurysm. IMPRESSION: 1. No acute osseous abnormality. 2. Multilevel degenerative disc disease, worst at L4-5 and L5-S1. 3. Facet degenerative changes at L4-5 and L5-S1. 4. Aortoiliac atherosclerosis without aneurysm. Electronically Signed   By: Evangeline Dakin M.D.   On: 02/02/2016 18:16   Ct Abdomen Pelvis W Contrast  Addendum Date: 02/02/2016   ADDENDUM REPORT: 02/02/2016 21:09 ADDENDUM: The hematoma is in the posterior pelvis anterior to the sacrum and coccyx. It is impressing upon the rectum and lower sigmoid colon prominence location in the posterior pelvis. It is separate from and does not involve the gluteal muscles. Electronically Signed   By: Lowella Grip III M.D.   On: 02/02/2016 21:09   Result Date: 02/02/2016 CLINICAL DATA:  Pain following fall EXAM: CT ABDOMEN AND PELVIS WITH CONTRAST TECHNIQUE: Multidetector CT imaging of the abdomen and pelvis was performed using the standard protocol following bolus administration of intravenous contrast. CONTRAST:  111m ISOVUE-300 IOPAMIDOL (ISOVUE-300) INJECTION 61% COMPARISON:  April 17, 2012 FINDINGS: Lower chest: There is atelectatic change in the left base with questionable mild consolidation in the posterior segment region. There is a small left pleural effusion. There is cardiomegaly. Pacemaker lead is attached to the right ventricle. Visualized pericardium is not thickened. Hepatobiliary: There is hepatic steatosis. No focal liver lesion evident. Liver appears intact without laceration or rupture. No perihepatic fluid. Gallbladder wall is not appreciably thickened. There is no biliary duct dilatation. Pancreas: No pancreatic mass or  inflammatory focus. No pancreatic laceration or rupture. Spleen: Spleen appears intact without laceration or rupture. No splenic lesions are evident. No perisplenic fluid. Adrenals/Urinary Tract: Right adrenal appears normal. There is a left adrenal mass measuring 1.2 x 1.2 cm, also present on prior study. There are no appreciable renal masses on either side. There is no renal laceration or rupture. There is chronic hydronephrosis in ureterectasis on the left. There is a calculus in the  left kidney measuring 8 x 6 mm. A second calculus measures 7 x 5 mm in the left kidney. There is no renal or ureteral calculus on the right. There is chronic thickening of the urinary bladder wall, particularly near the ureterovesical junction on the left. Stomach/Bowel: The rectum is compressed and deviated by a large masslike area measuring 8.1 x 8.0 x 9.4 cm. Within this lesion, there is an area of apparent arterial bleeding consistent with an acute hematoma. There is inflammation involving the distal sigmoid colon and upper rectum with stranding in the mesenteric in this area as well as localized fluid. Loculated ascites tracks throughout the posterior pelvis and retroperitoneum to the level of the aortic bifurcation. Elsewhere, there is no bowel wall or mesenteric thickening. No bowel obstruction. No free air or portal venous air. Vascular/Lymphatic: There is extensive atherosclerotic calcification in the aorta and iliac arteries. There is also calcification in both hypogastric arteries. There is no abdominal aortic aneurysm. Major mesenteric vessels appear patent. No adenopathy is evident in the abdomen or pelvis. Reproductive: Uterus is absent. Other: Appendix absent. No abscess. There is a small ventral hernia containing only fat. Musculoskeletal: There is evidence of an old healed fracture of the right ischium. There is a fracture in the coccyx near the large posterior pelvic hematoma. No other fracture is evident. There is  degenerative change in the lumbar spine. No blastic or lytic bone lesions are evident. There is no intramuscular or abdominal wall lesion. IMPRESSION: Large apparent hematoma with active arterial bleeding within this hematoma arising from an arterial vessel posteriorly. This apparent hematoma measures 8.1 x 8.0 x 9.4 cm. It displaces the rectum and lower sigmoid colon. There is fluid consistent with loculated ascites in the pelvis tracking upward into the retroperitoneum to the level of the aortic bifurcation. There is inflammation of the distal sigmoid colon with stranding in the soft tissues in this area. Nondisplaced coccyx fracture. Old fracture with healing right ischium. No bowel obstruction.  No abscess. Chronic hydronephrosis and ureterectasis on the left with urinary bladder wall thickening. Question chronic stenosis at the ureterovesical junction on the left. This finding may warrant cystoscopic evaluation to further assess. Nonobstructing calculi left kidney. Hepatic steatosis. Small left adrenal mass, stable. Cardiomegaly. Atelectasis with questionable small focus of pneumonia left base. There is extensive aortoiliac atherosclerosis. These results were called by telephone at the time of interpretation on 02/02/2016 at 8:40 pm to Dr. Milton Ferguson , who verbally acknowledged these results. These results were called by telephone at the time of interpretation on 02/02/2016 at 8:40 pm to Dr. Milton Ferguson , who verbally acknowledged these results. Electronically Signed: By: Lowella Grip III M.D. On: 02/02/2016 20:40    Procedures Procedures (including critical care time)  Medications Ordered in ED Medications  prothrombin complex conc human (KCENTRA) IVPB 1,650 Units (not administered)  phytonadione (VITAMIN K) 10 mg in dextrose 5 % 50 mL IVPB (not administered)  HYDROmorphone (DILAUDID) injection 1 mg (1 mg Intravenous Given 02/02/16 1740)  ondansetron (ZOFRAN) injection 4 mg (4 mg Intravenous  Given 02/02/16 1740)  HYDROmorphone (DILAUDID) injection 0.5 mg (0.5 mg Intravenous Given 02/02/16 1828)  iopamidol (ISOVUE-300) 61 % injection 100 mL (100 mLs Intravenous Contrast Given 02/02/16 2003)  sodium chloride 0.9 % bolus 1,000 mL (1,000 mLs Intravenous New Bag/Given 02/02/16 2057)  HYDROmorphone (DILAUDID) injection 0.5 mg (0.5 mg Intravenous Given 02/02/16 2057)     Initial Impression / Assessment and Plan / ED Course  I have reviewed  the triage vital signs and the nursing notes.  Pertinent labs & imaging results that were available during my care of the patient were reviewed by me and considered in my medical decision making (see chart for details).  Clinical Course    CRITICAL CARE Performed by: Dannia Snook L Total critical care time:40 minutes Critical care time was exclusive of separately billable procedures and treating other patients. Critical care was necessary to treat or prevent imminent or life-threatening deterioration. Critical care was time spent personally by me on the following activities: development of treatment plan with patient and/or surrogate as well as nursing, discussions with consultants, evaluation of patient's response to treatment, examination of patient, obtaining history from patient or surrogate, ordering and performing treatments and interventions, ordering and review of laboratory studies, ordering and review of radiographic studies, pulse oximetry and re-evaluation of patient's condition.  CT scan shows a large hematoma in her pelvis pushing on her pelvis. Radiologist suspects an arterial bleed that is still bleeding. Patient will be given Kcentra to reverse her Coumadin. And she is being transferred to [facility] for the trauma surgeon to take care of  Final Clinical Impressions(s) / ED Diagnoses   Final diagnoses:  None    New Prescriptions New Prescriptions   No medications on file     Milton Ferguson, MD 02/02/16 2118

## 2016-02-02 NOTE — ED Notes (Signed)
I&O cath performed, pt tolerated well, cloudy urine returned, bladder emptied approximately 600cc urine

## 2016-02-02 NOTE — ED Notes (Signed)
The pt is c/o pain now she remains restless and just wants to rest  Waiting on bed assignment

## 2016-02-02 NOTE — ED Provider Notes (Signed)
Medical screening examination performed:  Patient transferred from [facility] emergency department for trauma evaluation and admission. Patient fell from standing position, suffered pelvic hematoma secondary to chronic anticoagulation. Dr. Georgette Dover present in ER for admission.  Face to face Exam: HEENT - PERRLA Lungs - CTAB Heart - RRR, no M/R/G Abd - S/NT/ND Neuro - alert, oriented x3  Plan: Admit to trauma.   Orpah Greek, MD 02/02/16 [phone]

## 2016-02-02 NOTE — ED Notes (Signed)
Pt unable to give urine specimen at this time 

## 2016-02-02 NOTE — ED Notes (Signed)
The pt is dozing off at present intermittently   She reports that she is not having pain anymore

## 2016-02-03 LAB — CBC
HEMATOCRIT: 29.3 % — AB (ref 36.0–46.0)
HEMATOCRIT: 28.6 % — AB (ref 36.0–46.0)
HEMATOCRIT: 27.9 % — AB (ref 36.0–46.0)
HEMOGLOBIN: 10.1 g/dL — AB (ref 12.0–15.0)
HEMOGLOBIN: 9.6 g/dL — AB (ref 12.0–15.0)
Hemoglobin: 9.4 g/dL — ABNORMAL LOW (ref 12.0–15.0)
MCH: 32 pg (ref 26.0–34.0)
MCH: 31.2 pg (ref 26.0–34.0)
MCH: 31.6 pg (ref 26.0–34.0)
MCHC: 34.5 g/dL (ref 30.0–36.0)
MCHC: 33.6 g/dL (ref 30.0–36.0)
MCHC: 33.7 g/dL (ref 30.0–36.0)
MCV: 92.7 fL (ref 78.0–100.0)
MCV: 92.9 fL (ref 78.0–100.0)
MCV: 93.9 fL (ref 78.0–100.0)
PLATELETS: 213 10*3/uL (ref 150–400)
Platelets: 249 10*3/uL (ref 150–400)
Platelets: 234 10*3/uL (ref 150–400)
RBC: 3.16 MIL/uL — AB (ref 3.87–5.11)
RBC: 3.08 MIL/uL — AB (ref 3.87–5.11)
RBC: 2.97 MIL/uL — ABNORMAL LOW (ref 3.87–5.11)
RDW: 14.5 % (ref 11.5–15.5)
RDW: 14.4 % (ref 11.5–15.5)
RDW: 14.6 % (ref 11.5–15.5)
WBC: 17.8 10*3/uL — ABNORMAL HIGH (ref 4.0–10.5)
WBC: 16.4 10*3/uL — ABNORMAL HIGH (ref 4.0–10.5)
WBC: 14.5 10*3/uL — AB (ref 4.0–10.5)

## 2016-02-03 LAB — COMPREHENSIVE METABOLIC PANEL
ALBUMIN: 3.3 g/dL — AB (ref 3.5–5.0)
ALK PHOS: 45 U/L (ref 38–126)
ALT: 14 U/L (ref 14–54)
AST: 21 U/L (ref 15–41)
Anion gap: 10 (ref 5–15)
BILIRUBIN TOTAL: 1 mg/dL (ref 0.3–1.2)
BUN: 10 mg/dL (ref 6–20)
CALCIUM: 8.4 mg/dL — AB (ref 8.9–10.3)
CO2: 28 mmol/L (ref 22–32)
Chloride: 103 mmol/L (ref 101–111)
Creatinine, Ser: 0.75 mg/dL (ref 0.44–1.00)
GFR calc Af Amer: >60 mL/min (ref >60)
GFR calc non Af Amer: >60 mL/min (ref >60)
GLUCOSE: 143 mg/dL — AB (ref 65–99)
Potassium: 3.2 mmol/L — ABNORMAL LOW (ref 3.5–5.1)
SODIUM: 141 mmol/L (ref 135–145)
TOTAL PROTEIN: 6 g/dL — AB (ref 6.5–8.1)

## 2016-02-03 LAB — GLUCOSE, CAPILLARY
GLUCOSE-CAPILLARY: 119 mg/dL — AB (ref 65–99)
GLUCOSE-CAPILLARY: 106 mg/dL — AB (ref 65–99)
GLUCOSE-CAPILLARY: 173 mg/dL — AB (ref 65–99)
Glucose-Capillary: 112 mg/dL — ABNORMAL HIGH (ref 65–99)
Glucose-Capillary: 171 mg/dL — ABNORMAL HIGH (ref 65–99)
Glucose-Capillary: 190 mg/dL — ABNORMAL HIGH (ref 65–99)
Glucose-Capillary: 236 mg/dL — ABNORMAL HIGH (ref 65–99)

## 2016-02-03 LAB — PROTIME-INR
INR: 1.35
Prothrombin Time: 16.8 seconds — ABNORMAL HIGH (ref 11.4–15.2)

## 2016-02-03 LAB — ABO/RH: ABO/RH(D): A POS

## 2016-02-03 LAB — MRSA PCR SCREENING: MRSA BY PCR: NEGATIVE

## 2016-02-03 MED ORDER — DOCUSATE SODIUM 100 MG PO CAPS
100.0000 mg | ORAL_CAPSULE | Freq: Two times a day (BID) | ORAL | Status: DC
  Administered 2016-02-03 – 2016-02-08 (×10): 100 mg via ORAL
  Filled 2016-02-03 (×10): qty 1

## 2016-02-03 MED ORDER — METFORMIN HCL 500 MG PO TABS
500.0000 mg | ORAL_TABLET | Freq: Two times a day (BID) | ORAL | Status: DC
  Administered 2016-02-03 – 2016-02-08 (×10): 500 mg via ORAL
  Filled 2016-02-03 (×10): qty 1

## 2016-02-03 MED ORDER — FENTANYL CITRATE (PF) 100 MCG/2ML IJ SOLN
INTRAMUSCULAR | Status: AC
  Administered 2016-02-03: 50 ug via INTRAVENOUS
  Filled 2016-02-03: qty 2

## 2016-02-03 MED ORDER — ONDANSETRON HCL 4 MG/2ML IJ SOLN
4.0000 mg | Freq: Four times a day (QID) | INTRAMUSCULAR | Status: DC | PRN
  Administered 2016-02-06: 4 mg via INTRAVENOUS
  Filled 2016-02-03: qty 2

## 2016-02-03 MED ORDER — AMLODIPINE BESYLATE 5 MG PO TABS
5.0000 mg | ORAL_TABLET | Freq: Every morning | ORAL | Status: DC
  Administered 2016-02-03 – 2016-02-08 (×6): 5 mg via ORAL
  Filled 2016-02-03 (×6): qty 1

## 2016-02-03 MED ORDER — ONDANSETRON HCL 4 MG PO TABS: 4.0000 mg | ORAL_TABLET | Freq: Four times a day (QID) | ORAL | Status: DC | PRN

## 2016-02-03 MED ORDER — POTASSIUM CHLORIDE CRYS ER 20 MEQ PO TBCR
20.0000 meq | EXTENDED_RELEASE_TABLET | Freq: Two times a day (BID) | ORAL | Status: AC
  Administered 2016-02-03 (×2): 20 meq via ORAL
  Filled 2016-02-03 (×2): qty 1

## 2016-02-03 MED ORDER — HYDROMORPHONE HCL 1 MG/ML IJ SOLN: 0.5000 mg | INTRAMUSCULAR | Status: DC | PRN

## 2016-02-03 MED ORDER — CITALOPRAM HYDROBROMIDE 20 MG PO TABS
20.0000 mg | ORAL_TABLET | Freq: Every morning | ORAL | Status: DC
  Administered 2016-02-04 – 2016-02-08 (×5): 20 mg via ORAL
  Filled 2016-02-03 (×5): qty 1

## 2016-02-03 MED ORDER — LOSARTAN POTASSIUM 50 MG PO TABS
50.0000 mg | ORAL_TABLET | Freq: Every day | ORAL | Status: DC
  Administered 2016-02-03 – 2016-02-08 (×6): 50 mg via ORAL
  Filled 2016-02-03 (×6): qty 1

## 2016-02-03 MED ORDER — ESCITALOPRAM OXALATE 10 MG PO TABS
10.0000 mg | ORAL_TABLET | Freq: Every day | ORAL | Status: DC
  Administered 2016-02-03 – 2016-02-07 (×5): 10 mg via ORAL
  Filled 2016-02-03 (×5): qty 1

## 2016-02-03 MED ORDER — FENTANYL CITRATE (PF) 100 MCG/2ML IJ SOLN
50.0000 ug | INTRAMUSCULAR | Status: DC | PRN
  Administered 2016-02-03 – 2016-02-05 (×6): 50 ug via INTRAVENOUS
  Filled 2016-02-03 (×6): qty 2

## 2016-02-03 MED ORDER — INSULIN ASPART 100 UNIT/ML ~~LOC~~ SOLN
0.0000 [IU] | SUBCUTANEOUS | Status: DC
  Administered 2016-02-03: 3 [IU] via SUBCUTANEOUS
  Administered 2016-02-03 – 2016-02-04 (×3): 2 [IU] via SUBCUTANEOUS
  Administered 2016-02-04: 1 [IU] via SUBCUTANEOUS
  Administered 2016-02-05 (×2): 2 [IU] via SUBCUTANEOUS
  Administered 2016-02-05 – 2016-02-06 (×4): 1 [IU] via SUBCUTANEOUS
  Administered 2016-02-06 – 2016-02-07 (×2): 2 [IU] via SUBCUTANEOUS
  Administered 2016-02-07 (×2): 1 [IU] via SUBCUTANEOUS

## 2016-02-03 MED ORDER — METOPROLOL TARTRATE 25 MG PO TABS
25.0000 mg | ORAL_TABLET | Freq: Two times a day (BID) | ORAL | Status: DC
  Administered 2016-02-03 – 2016-02-08 (×11): 25 mg via ORAL
  Filled 2016-02-03 (×11): qty 1

## 2016-02-03 MED ORDER — SODIUM CHLORIDE 0.9 % IV SOLN: INTRAVENOUS | Status: DC

## 2016-02-03 MED ORDER — DIGOXIN 125 MCG PO TABS
125.0000 ug | ORAL_TABLET | Freq: Every morning | ORAL | Status: DC
  Administered 2016-02-03 – 2016-02-08 (×6): 125 ug via ORAL
  Filled 2016-02-03 (×7): qty 1

## 2016-02-03 MED ORDER — CITALOPRAM HYDROBROMIDE 40 MG PO TABS
40.0000 mg | ORAL_TABLET | Freq: Every morning | ORAL | Status: DC
  Administered 2016-02-03: 40 mg via ORAL
  Filled 2016-02-03: qty 1

## 2016-02-03 NOTE — ED Notes (Signed)
Attempted report rn unable to get report

## 2016-02-03 NOTE — Progress Notes (Signed)
Subjective: Less pain, wants water  Objective: Vital signs in last 24 hours: Temp:  [97.4 F (36.3 C)-97.9 F (36.6 C)] 97.7 F (36.5 C) (01/07 0625) Pulse Rate:  [63-108] 75 (01/07 0600) Resp:  [13-25] 15 (01/07 0600) BP: (129-207)/(73-113) 159/73 (01/07 0600) SpO2:  [91 %-99 %] 97 % (01/07 0600) Weight:  [64.3 kg (141 lb 12.1 oz)-66.2 kg (146 lb)] 66.2 kg (146 lb) (01/07 0600) Last BM Date: 02/02/16 (per pt report - pta)  Intake/Output from previous day: 01/06 0701 - 01/07 0700 In: -  Out: 900 [Urine:900] Intake/Output this shift: No intake/output data recorded.  General appearance: alert and cooperative Resp: clear to auscultation bilaterally Cardio: irregularly irregular rhythm GI: soft, NT, +BS  Buttocks: B perianal ecchymoses, soft  Lab Results: CBC   Recent Labs  02/02/16 2301 02/03/16 0537  WBC 18.9* 17.8*  HGB 11.9* 10.1*  HCT 34.7* 29.3*  PLT 284 249   BMET  Recent Labs  02/02/16 2301 02/03/16 0537  NA 137 141  K 3.1* 3.2*  CL 102 103  CO2 22 28  GLUCOSE 231* 143*  BUN 10 10  CREATININE 0.66 0.75  CALCIUM 8.2* 8.4*   PT/INR  Recent Labs  02/02/16 2301 02/03/16 0537  LABPROT 18.4* 16.8*  INR 1.51 1.35   ABG No results for input(s): PHART, HCO3 in the last 72 hours.  Invalid input(s): PCO2, PO2  Studies/Results: Dg Lumbar Spine Complete  Result Date: 02/02/2016 CLINICAL DATA:  82 year old with low back pain after she slipped and fell on her floor at home earlier today. Initial encounter. EXAM: LUMBAR SPINE - COMPLETE 4+ VIEW COMPARISON:  Bone window images from CT abdomen and pelvis 04/17/2012. FINDINGS: Five non-rib-bearing lumbar vertebrae with anatomic alignment. No fractures. Disc space narrowing at every lumbar level, worst at L4-5 and L5-S1. Osseous demineralization. Facet degenerative changes at L4-5 and L5-S1. Sacroiliac joints intact. Aortoiliac atherosclerosis without aneurysm. IMPRESSION: 1. No acute osseous abnormality.  2. Multilevel degenerative disc disease, worst at L4-5 and L5-S1. 3. Facet degenerative changes at L4-5 and L5-S1. 4. Aortoiliac atherosclerosis without aneurysm. Electronically Signed   By: Evangeline Dakin M.D.   On: 02/02/2016 18:16   Ct Abdomen Pelvis W Contrast  Addendum Date: 02/02/2016   ADDENDUM REPORT: 02/02/2016 21:09 ADDENDUM: The hematoma is in the posterior pelvis anterior to the sacrum and coccyx. It is impressing upon the rectum and lower sigmoid colon prominence location in the posterior pelvis. It is separate from and does not involve the gluteal muscles. Electronically Signed   By: Lowella Grip III M.D.   On: 02/02/2016 21:09   Result Date: 02/02/2016 CLINICAL DATA:  Pain following fall EXAM: CT ABDOMEN AND PELVIS WITH CONTRAST TECHNIQUE: Multidetector CT imaging of the abdomen and pelvis was performed using the standard protocol following bolus administration of intravenous contrast. CONTRAST:  151mL ISOVUE-300 IOPAMIDOL (ISOVUE-300) INJECTION 61% COMPARISON:  April 17, 2012 FINDINGS: Lower chest: There is atelectatic change in the left base with questionable mild consolidation in the posterior segment region. There is a small left pleural effusion. There is cardiomegaly. Pacemaker lead is attached to the right ventricle. Visualized pericardium is not thickened. Hepatobiliary: There is hepatic steatosis. No focal liver lesion evident. Liver appears intact without laceration or rupture. No perihepatic fluid. Gallbladder wall is not appreciably thickened. There is no biliary duct dilatation. Pancreas: No pancreatic mass or inflammatory focus. No pancreatic laceration or rupture. Spleen: Spleen appears intact without laceration or rupture. No splenic lesions are evident. No perisplenic fluid. Adrenals/Urinary  Tract: Right adrenal appears normal. There is a left adrenal mass measuring 1.2 x 1.2 cm, also present on prior study. There are no appreciable renal masses on either side. There is no  renal laceration or rupture. There is chronic hydronephrosis in ureterectasis on the left. There is a calculus in the left kidney measuring 8 x 6 mm. A second calculus measures 7 x 5 mm in the left kidney. There is no renal or ureteral calculus on the right. There is chronic thickening of the urinary bladder wall, particularly near the ureterovesical junction on the left. Stomach/Bowel: The rectum is compressed and deviated by a large masslike area measuring 8.1 x 8.0 x 9.4 cm. Within this lesion, there is an area of apparent arterial bleeding consistent with an acute hematoma. There is inflammation involving the distal sigmoid colon and upper rectum with stranding in the mesenteric in this area as well as localized fluid. Loculated ascites tracks throughout the posterior pelvis and retroperitoneum to the level of the aortic bifurcation. Elsewhere, there is no bowel wall or mesenteric thickening. No bowel obstruction. No free air or portal venous air. Vascular/Lymphatic: There is extensive atherosclerotic calcification in the aorta and iliac arteries. There is also calcification in both hypogastric arteries. There is no abdominal aortic aneurysm. Major mesenteric vessels appear patent. No adenopathy is evident in the abdomen or pelvis. Reproductive: Uterus is absent. Other: Appendix absent. No abscess. There is a small ventral hernia containing only fat. Musculoskeletal: There is evidence of an old healed fracture of the right ischium. There is a fracture in the coccyx near the large posterior pelvic hematoma. No other fracture is evident. There is degenerative change in the lumbar spine. No blastic or lytic bone lesions are evident. There is no intramuscular or abdominal wall lesion. IMPRESSION: Large apparent hematoma with active arterial bleeding within this hematoma arising from an arterial vessel posteriorly. This apparent hematoma measures 8.1 x 8.0 x 9.4 cm. It displaces the rectum and lower sigmoid colon.  There is fluid consistent with loculated ascites in the pelvis tracking upward into the retroperitoneum to the level of the aortic bifurcation. There is inflammation of the distal sigmoid colon with stranding in the soft tissues in this area. Nondisplaced coccyx fracture. Old fracture with healing right ischium. No bowel obstruction.  No abscess. Chronic hydronephrosis and ureterectasis on the left with urinary bladder wall thickening. Question chronic stenosis at the ureterovesical junction on the left. This finding may warrant cystoscopic evaluation to further assess. Nonobstructing calculi left kidney. Hepatic steatosis. Small left adrenal mass, stable. Cardiomegaly. Atelectasis with questionable small focus of pneumonia left base. There is extensive aortoiliac atherosclerosis. These results were called by telephone at the time of interpretation on 02/02/2016 at 8:40 pm to Dr. Milton Ferguson , who verbally acknowledged these results. These results were called by telephone at the time of interpretation on 02/02/2016 at 8:40 pm to Dr. Milton Ferguson , who verbally acknowledged these results. Electronically Signed: By: Lowella Grip III M.D. On: 02/02/2016 20:40    Anti-infectives: Anti-infectives    None      Assessment/Plan: Ground level fall Coccyx FX with large pelvic hematoma - anticoagulation corrected, hold coumadin, clinically looks good, serial CBC today, if Hb continues to fall may need IR. Bedrest. ABL anemia - due to above A fib/HTN/CAD - home lopressor, Cozaar, Norvasc FEN - clears, replace K DM - SSI VTE - PAS Dispo - ICU   LOS: 1 day    Georganna Skeans, MD, MPH, FACS  Trauma: [phone] General Surgery: [phone]  1/7/2018Patient ID: Traci [name], female   DOB: [birth year], 82 y.o.   MRN: [id number]

## 2016-02-03 NOTE — Progress Notes (Signed)
Called patient's daughter, Raylene Everts to inform her that the patient moved to [facility]. Family updated on patient condition.

## 2016-02-03 NOTE — ED Notes (Signed)
Report given to rn on 3m 

## 2016-02-04 LAB — GLUCOSE, CAPILLARY
GLUCOSE-CAPILLARY: 110 mg/dL — AB (ref 65–99)
GLUCOSE-CAPILLARY: 118 mg/dL — AB (ref 65–99)
GLUCOSE-CAPILLARY: 120 mg/dL — AB (ref 65–99)
GLUCOSE-CAPILLARY: 146 mg/dL — AB (ref 65–99)
GLUCOSE-CAPILLARY: 199 mg/dL — AB (ref 65–99)
Glucose-Capillary: 150 mg/dL — ABNORMAL HIGH (ref 65–99)

## 2016-02-04 LAB — CBC
HCT: 27.7 % — ABNORMAL LOW (ref 36.0–46.0)
Hemoglobin: 9.4 g/dL — ABNORMAL LOW (ref 12.0–15.0)
MCH: 32.1 pg (ref 26.0–34.0)
MCHC: 33.9 g/dL (ref 30.0–36.0)
MCV: 94.5 fL (ref 78.0–100.0)
PLATELETS: 205 10*3/uL (ref 150–400)
RBC: 2.93 MIL/uL — ABNORMAL LOW (ref 3.87–5.11)
RDW: 14.6 % (ref 11.5–15.5)
WBC: 13.6 10*3/uL — ABNORMAL HIGH (ref 4.0–10.5)

## 2016-02-04 LAB — BASIC METABOLIC PANEL
Anion gap: 6 (ref 5–15)
BUN: 7 mg/dL (ref 6–20)
CALCIUM: 8.7 mg/dL — AB (ref 8.9–10.3)
CO2: 29 mmol/L (ref 22–32)
CREATININE: 0.74 mg/dL (ref 0.44–1.00)
Chloride: 104 mmol/L (ref 101–111)
GFR calc Af Amer: >60 mL/min (ref >60)
GLUCOSE: 124 mg/dL — AB (ref 65–99)
Potassium: 3.3 mmol/L — ABNORMAL LOW (ref 3.5–5.1)
Sodium: 139 mmol/L (ref 135–145)

## 2016-02-04 LAB — HEMOGLOBIN A1C
Hgb A1c MFr Bld: 6.6 % — ABNORMAL HIGH (ref 4.8–5.6)
Mean Plasma Glucose: 143 mg/dL

## 2016-02-04 LAB — PROTIME-INR
INR: 1.32
Prothrombin Time: 16.4 s — ABNORMAL HIGH (ref 11.4–15.2)

## 2016-02-04 MED ORDER — PNEUMOCOCCAL VAC POLYVALENT 25 MCG/0.5ML IJ INJ: 0.5000 mL | INJECTION | INTRAMUSCULAR | Status: DC

## 2016-02-04 NOTE — Care Management Note (Addendum)
Case Management Note  Patient Details  Name: TEOFILA MINYARD MRN: [id number] Date of Birth: [birth year]  Subjective/Objective: Pt admitted on 02/02/15 s/p fall with non-displaced coccygeal fx, pelvic hematoma, displacement of rectum.  PTA, pt resided at home alone; daughter and son in law live on her property.                      Action/Plan: PT/OT evaluations pending.  Will follow for discharge planning as pt progresses.    Expected Discharge Date:                  Expected Discharge Plan:  Home with [facility] In-House Referral:     Discharge planning Services  CM Consult  Post Acute Care Choice:    Choice offered to:     DME Arranged:    DME Agency:     HH Arranged:    HH Agency:     Status of Service:  In process, will continue to follow  If discussed at Long Length of Stay Meetings, dates discussed:    Additional Comments:  Reinaldo Raddle, RN, BSN  Trauma/Neuro ICU Case Manager [phone]

## 2016-02-04 NOTE — Progress Notes (Signed)
Subjective: Reports minimal pain Hemodynamically stable overnight  Objective: Vital signs in last 24 hours: Temp:  [97.4 F (36.3 C)-99.4 F (37.4 C)] 99.1 F (37.3 C) (01/08 0445) Pulse Rate:  [58-82] 69 (01/08 0700) Resp:  [7-28] 14 (01/08 0700) BP: (117-176)/(61-113) 176/89 (01/08 0700) SpO2:  [90 %-100 %] 98 % (01/08 0700) Last BM Date:  (Prior to admission)  Intake/Output from previous day: 01/07 0701 - 01/08 0700 In: 1950 [P.O.:1720; I.V.:230] Out: 1400 [Urine:1400] Intake/Output this shift: No intake/output data recorded.  Exam: Awake and alert Abdomen soft, non tender  Lab Results:   Recent Labs  02/03/16 2109 02/04/16 0241  WBC 14.5* 13.6*  HGB 9.4* 9.4*  HCT 27.9* 27.7*  PLT 213 205   BMET  Recent Labs  02/03/16 0537 02/04/16 0241  NA 141 139  K 3.2* 3.3*  CL 103 104  CO2 28 29  GLUCOSE 143* 124*  BUN 10 7  CREATININE 0.75 0.74  CALCIUM 8.4* 8.7*   PT/INR  Recent Labs  02/03/16 0537 02/04/16 0241  LABPROT 16.8* 16.4*  INR 1.35 1.32   ABG No results for input(s): PHART, HCO3 in the last 72 hours.  Invalid input(s): PCO2, PO2  Studies/Results: Dg Lumbar Spine Complete  Result Date: 02/02/2016 CLINICAL DATA:  82 year old with low back pain after she slipped and fell on her floor at home earlier today. Initial encounter. EXAM: LUMBAR SPINE - COMPLETE 4+ VIEW COMPARISON:  Bone window images from CT abdomen and pelvis 04/17/2012. FINDINGS: Five non-rib-bearing lumbar vertebrae with anatomic alignment. No fractures. Disc space narrowing at every lumbar level, worst at L4-5 and L5-S1. Osseous demineralization. Facet degenerative changes at L4-5 and L5-S1. Sacroiliac joints intact. Aortoiliac atherosclerosis without aneurysm. IMPRESSION: 1. No acute osseous abnormality. 2. Multilevel degenerative disc disease, worst at L4-5 and L5-S1. 3. Facet degenerative changes at L4-5 and L5-S1. 4. Aortoiliac atherosclerosis without aneurysm.  Electronically Signed   By: Evangeline Dakin M.D.   On: 02/02/2016 18:16   Ct Abdomen Pelvis W Contrast  Addendum Date: 02/02/2016   ADDENDUM REPORT: 02/02/2016 21:09 ADDENDUM: The hematoma is in the posterior pelvis anterior to the sacrum and coccyx. It is impressing upon the rectum and lower sigmoid colon prominence location in the posterior pelvis. It is separate from and does not involve the gluteal muscles. Electronically Signed   By: Lowella Grip III M.D.   On: 02/02/2016 21:09   Result Date: 02/02/2016 CLINICAL DATA:  Pain following fall EXAM: CT ABDOMEN AND PELVIS WITH CONTRAST TECHNIQUE: Multidetector CT imaging of the abdomen and pelvis was performed using the standard protocol following bolus administration of intravenous contrast. CONTRAST:  110mL ISOVUE-300 IOPAMIDOL (ISOVUE-300) INJECTION 61% COMPARISON:  April 17, 2012 FINDINGS: Lower chest: There is atelectatic change in the left base with questionable mild consolidation in the posterior segment region. There is a small left pleural effusion. There is cardiomegaly. Pacemaker lead is attached to the right ventricle. Visualized pericardium is not thickened. Hepatobiliary: There is hepatic steatosis. No focal liver lesion evident. Liver appears intact without laceration or rupture. No perihepatic fluid. Gallbladder wall is not appreciably thickened. There is no biliary duct dilatation. Pancreas: No pancreatic mass or inflammatory focus. No pancreatic laceration or rupture. Spleen: Spleen appears intact without laceration or rupture. No splenic lesions are evident. No perisplenic fluid. Adrenals/Urinary Tract: Right adrenal appears normal. There is a left adrenal mass measuring 1.2 x 1.2 cm, also present on prior study. There are no appreciable renal masses on either side. There is no  renal laceration or rupture. There is chronic hydronephrosis in ureterectasis on the left. There is a calculus in the left kidney measuring 8 x 6 mm. A second  calculus measures 7 x 5 mm in the left kidney. There is no renal or ureteral calculus on the right. There is chronic thickening of the urinary bladder wall, particularly near the ureterovesical junction on the left. Stomach/Bowel: The rectum is compressed and deviated by a large masslike area measuring 8.1 x 8.0 x 9.4 cm. Within this lesion, there is an area of apparent arterial bleeding consistent with an acute hematoma. There is inflammation involving the distal sigmoid colon and upper rectum with stranding in the mesenteric in this area as well as localized fluid. Loculated ascites tracks throughout the posterior pelvis and retroperitoneum to the level of the aortic bifurcation. Elsewhere, there is no bowel wall or mesenteric thickening. No bowel obstruction. No free air or portal venous air. Vascular/Lymphatic: There is extensive atherosclerotic calcification in the aorta and iliac arteries. There is also calcification in both hypogastric arteries. There is no abdominal aortic aneurysm. Major mesenteric vessels appear patent. No adenopathy is evident in the abdomen or pelvis. Reproductive: Uterus is absent. Other: Appendix absent. No abscess. There is a small ventral hernia containing only fat. Musculoskeletal: There is evidence of an old healed fracture of the right ischium. There is a fracture in the coccyx near the large posterior pelvic hematoma. No other fracture is evident. There is degenerative change in the lumbar spine. No blastic or lytic bone lesions are evident. There is no intramuscular or abdominal wall lesion. IMPRESSION: Large apparent hematoma with active arterial bleeding within this hematoma arising from an arterial vessel posteriorly. This apparent hematoma measures 8.1 x 8.0 x 9.4 cm. It displaces the rectum and lower sigmoid colon. There is fluid consistent with loculated ascites in the pelvis tracking upward into the retroperitoneum to the level of the aortic bifurcation. There is  inflammation of the distal sigmoid colon with stranding in the soft tissues in this area. Nondisplaced coccyx fracture. Old fracture with healing right ischium. No bowel obstruction.  No abscess. Chronic hydronephrosis and ureterectasis on the left with urinary bladder wall thickening. Question chronic stenosis at the ureterovesical junction on the left. This finding may warrant cystoscopic evaluation to further assess. Nonobstructing calculi left kidney. Hepatic steatosis. Small left adrenal mass, stable. Cardiomegaly. Atelectasis with questionable small focus of pneumonia left base. There is extensive aortoiliac atherosclerosis. These results were called by telephone at the time of interpretation on 02/02/2016 at 8:40 pm to Dr. Milton Ferguson , who verbally acknowledged these results. These results were called by telephone at the time of interpretation on 02/02/2016 at 8:40 pm to Dr. Milton Ferguson , who verbally acknowledged these results. Electronically Signed: By: Lowella Grip III M.D. On: 02/02/2016 20:40    Anti-infectives: Anti-infectives    None      Assessment/Plan: Coccyx fracture with pelvic hematoma  Hgb stable last 18 hours. Will transfer to floor and advance diet Leave at bed rest  LOS: 2 days    Dae Highley A 02/04/2016

## 2016-02-05 LAB — GLUCOSE, CAPILLARY
GLUCOSE-CAPILLARY: 99 mg/dL (ref 65–99)
GLUCOSE-CAPILLARY: 157 mg/dL — AB (ref 65–99)
GLUCOSE-CAPILLARY: 146 mg/dL — AB (ref 65–99)
Glucose-Capillary: 120 mg/dL — ABNORMAL HIGH (ref 65–99)
Glucose-Capillary: 165 mg/dL — ABNORMAL HIGH (ref 65–99)

## 2016-02-05 LAB — PROTIME-INR
INR: 1.16
PROTHROMBIN TIME: 14.9 s (ref 11.4–15.2)

## 2016-02-05 LAB — CBC
HCT: 28.4 % — ABNORMAL LOW (ref 36.0–46.0)
Hemoglobin: 9.3 g/dL — ABNORMAL LOW (ref 12.0–15.0)
MCH: 30.9 pg (ref 26.0–34.0)
MCHC: 32.7 g/dL (ref 30.0–36.0)
MCV: 94.4 fL (ref 78.0–100.0)
PLATELETS: 234 10*3/uL (ref 150–400)
RBC: 3.01 MIL/uL — AB (ref 3.87–5.11)
RDW: 14.2 % (ref 11.5–15.5)
WBC: 12.5 10*3/uL — AB (ref 4.0–10.5)

## 2016-02-05 MED ORDER — HYDROMORPHONE HCL 2 MG/ML IJ SOLN: 0.5000 mg | INTRAMUSCULAR | Status: DC | PRN

## 2016-02-05 NOTE — Progress Notes (Signed)
  Subjective: Ate well, pain controlled  Objective: Vital signs in last 24 hours: Temp:  [98.6 F (37 C)-99.2 F (37.3 C)] 98.6 F (37 C) (01/09 0756) Pulse Rate:  [60-79] 61 (01/09 0700) Resp:  [13-21] 18 (01/09 0700) BP: (88-160)/(51-105) 147/105 (01/09 0700) SpO2:  [91 %-100 %] 94 % (01/09 0700) Last BM Date:  (PTA)  Intake/Output from previous day: 01/08 0701 - 01/09 0700 In: -  Out: 725 [Urine:725] Intake/Output this shift: No intake/output data recorded.  General appearance: cooperative Resp: clear to auscultation bilaterally Cardio: irregularly irregular rhythm GI: soft, NT, ND, B perianal ecchymoses soft, no cellulitis  Lab Results: CBC   Recent Labs  02/04/16 0241 02/05/16 0308  WBC 13.6* 12.5*  HGB 9.4* 9.3*  HCT 27.7* 28.4*  PLT 205 234   BMET  Recent Labs  02/03/16 0537 02/04/16 0241  NA 141 139  K 3.2* 3.3*  CL 103 104  CO2 28 29  GLUCOSE 143* 124*  BUN 10 7  CREATININE 0.75 0.74  CALCIUM 8.4* 8.7*   PT/INR  Recent Labs  02/04/16 0241 02/05/16 0308  LABPROT 16.4* 14.9  INR 1.32 1.16   ABG No results for input(s): PHART, HCO3 in the last 72 hours.  Invalid input(s): PCO2, PO2  Studies/Results: No results found.  Anti-infectives: Anti-infectives    None      Assessment/Plan: Ground level fall Coccyx FX with large pelvic hematoma - Hb stable, mobilize ABL anemia - due to above, stabilized A fib/HTN/CAD - home lopressor, Cozaar, Norvasc FEN - cardiac diet DM - SSI VTE - PAS Dispo - to floor, PT/OT   LOS: 3 days    Georganna Skeans, MD, MPH, FACS Trauma: [phone] General Surgery: [phone]  1/9/2018Patient ID: Traci [name], female   DOB: [birth year], 82 y.o.   MRN: [id number]

## 2016-02-05 NOTE — Evaluation (Signed)
Physical Therapy Evaluation Patient Details Name: CHIANTI PETSKA MRN: [id number] DOB: [birth year] Today's Date: 02/05/2016   History of Present Illness  This is a 82 yo female with multiple medical issues who is anticoagulated on Coumadin for atrial fibrillation who apparently fell down on her buttocks in her kitchen this morning.  The fall was not witnessed.  Family members helped her up.  This occurred about 9 AM.  About five hours later, she was complaining of some pain in her tailbone, so her family brought her to [facility] ED.  Plain films were unremarkable, but a CT scan showed a large pelvic hematoma and a non-displaced coccygeal fracture.   Clinical Impression  Pt admitted with above. Pt functioning well. Due to coccyx fracture pt with limited sitting tolerance as expected. Pt able to amb 300' with RW. Per pt she can have 24/7 assist, pt would be safe to go home with use of RW and 24/7 assist.    Follow Up Recommendations Supervision/Assistance - 24 hour;Home health PT (pt may progress and not need HHPT)    Equipment Recommendations   (has RW)    Recommendations for Other Services       Precautions / Restrictions Precautions Precautions: Fall Restrictions Weight Bearing Restrictions: No      Mobility  Bed Mobility Overal bed mobility: Needs Assistance Bed Mobility: Rolling;Sidelying to Sit Rolling: Modified independent (Device/Increase time) (with bed rail) Sidelying to sit: Min assist       General bed mobility comments: slow due to fear of onset of pain but did well  Transfers Overall transfer level: Needs assistance Equipment used: Rolling walker (2 wheeled) Transfers: Sit to/from Stand Sit to Stand: Min assist         General transfer comment: minA during transition of hands from bed to RW  Ambulation/Gait Ambulation/Gait assistance: Min guard Ambulation Distance (Feet): 300 Feet Assistive device: Rolling walker (2 wheeled) Gait Pattern/deviations:  Step-through pattern Gait velocity: slow Gait velocity interpretation: Below normal speed for age/gender General Gait Details: slow and guarded but denies pain  Stairs            Wheelchair Mobility    Modified Rankin (Stroke Patients Only)       Balance Overall balance assessment: Needs assistance Sitting-balance support: Feet supported;Bilateral upper extremity supported Sitting balance-Leahy Scale: Fair     Standing balance support: Bilateral upper extremity supported Standing balance-Leahy Scale: Fair Standing balance comment: benefits from RW due to injury                             Pertinent Vitals/Pain Pain Assessment: 0-10 Pain Score:  (0 with supine and amb, 5 with sitting) Pain Location: buttocks Pain Descriptors / Indicators: Aching Pain Intervention(s): Monitored during session    Home Living Family/patient expects to be discharged to:: Private residence Living Arrangements: Alone Available Help at Discharge: Family;Available 24 hours/day Type of Home: House Home Access: Stairs to enter (but also has a level entry) Entrance Stairs-Rails: Right Entrance Stairs-Number of Steps: 2 Home Layout: One level Home Equipment: Walker - 2 wheels Additional Comments: pt reports her dtr-in-law and sister can stay with her    Prior Function Level of Independence: Independent         Comments: drives     Hand Dominance        Extremity/Trunk Assessment   Upper Extremity Assessment Upper Extremity Assessment: Overall WFL for tasks assessed    Lower Extremity  Assessment Lower Extremity Assessment: Overall WFL for tasks assessed    Cervical / Trunk Assessment Cervical / Trunk Assessment: Normal  Communication   Communication: No difficulties  Cognition Arousal/Alertness: Awake/alert Behavior During Therapy: WFL for tasks assessed/performed Overall Cognitive Status: Within Functional Limits for tasks assessed                       General Comments      Exercises     Assessment/Plan    PT Assessment Patient needs continued PT services  PT Problem List Decreased strength          PT Treatment Interventions DME instruction;Gait training;Stair training;Functional mobility training;Therapeutic activities;Therapeutic exercise    PT Goals (Current goals can be found in the Care Plan section)  Acute Rehab PT Goals Patient Stated Goal: heal PT Goal Formulation: With patient Time For Goal Achievement: 02/12/16 Potential to Achieve Goals: Good    Frequency Min 3X/week   Barriers to discharge        Co-evaluation               End of Session Equipment Utilized During Treatment: Gait belt Activity Tolerance: Patient tolerated treatment well Patient left: in chair;with call bell/phone within reach Nurse Communication: Mobility status         Time: [id number] PT Time Calculation (min) (ACUTE ONLY): 28 min   Charges:   PT Evaluation $PT Eval Low Complexity: 1 Procedure PT Treatments $Gait Training: 8-22 mins   PT G Codes:        Falan Hensler M Beyza Bellino 02/05/2016, 11:31 AM  Kittie Plater, PT, DPT Pager #: [phone] Office #: [phone]

## 2016-02-06 DIAGNOSIS — D62 Acute posthemorrhagic anemia: Secondary | ICD-10-CM | POA: Diagnosis not present

## 2016-02-06 DIAGNOSIS — S322XXA Fracture of coccyx, initial encounter for closed fracture: Secondary | ICD-10-CM | POA: Diagnosis present

## 2016-02-06 DIAGNOSIS — W19XXXA Unspecified fall, initial encounter: Secondary | ICD-10-CM | POA: Diagnosis present

## 2016-02-06 LAB — CBC
HEMATOCRIT: 28.9 % — AB (ref 36.0–46.0)
HEMOGLOBIN: 9.4 g/dL — AB (ref 12.0–15.0)
MCH: 31.1 pg (ref 26.0–34.0)
MCHC: 32.5 g/dL (ref 30.0–36.0)
MCV: 95.7 fL (ref 78.0–100.0)
Platelets: 268 10*3/uL (ref 150–400)
RBC: 3.02 MIL/uL — ABNORMAL LOW (ref 3.87–5.11)
RDW: 14.4 % (ref 11.5–15.5)
WBC: 12.4 10*3/uL — AB (ref 4.0–10.5)

## 2016-02-06 LAB — GLUCOSE, CAPILLARY
GLUCOSE-CAPILLARY: 122 mg/dL — AB (ref 65–99)
GLUCOSE-CAPILLARY: 154 mg/dL — AB (ref 65–99)
Glucose-Capillary: 103 mg/dL — ABNORMAL HIGH (ref 65–99)
Glucose-Capillary: 130 mg/dL — ABNORMAL HIGH (ref 65–99)
Glucose-Capillary: 134 mg/dL — ABNORMAL HIGH (ref 65–99)
Glucose-Capillary: 90 mg/dL (ref 65–99)

## 2016-02-06 MED ORDER — BISACODYL 5 MG PO TBEC: 10.0000 mg | DELAYED_RELEASE_TABLET | Freq: Every day | ORAL | Status: DC | PRN

## 2016-02-06 MED ORDER — BISACODYL 10 MG RE SUPP: 10.0000 mg | Freq: Every day | RECTAL | Status: DC | PRN

## 2016-02-06 MED ORDER — TRAMADOL HCL 50 MG PO TABS
50.0000 mg | ORAL_TABLET | Freq: Four times a day (QID) | ORAL | Status: DC | PRN
  Administered 2016-02-07: 50 mg via ORAL
  Administered 2016-02-07: 100 mg via ORAL
  Administered 2016-02-07: 50 mg via ORAL
  Administered 2016-02-08 (×2): 100 mg via ORAL
  Filled 2016-02-06 (×2): qty 2
  Filled 2016-02-06: qty 1
  Filled 2016-02-06: qty 2
  Filled 2016-02-06: qty 1

## 2016-02-06 MED ORDER — POLYETHYLENE GLYCOL 3350 17 G PO PACK
17.0000 g | PACK | Freq: Every day | ORAL | Status: DC
  Administered 2016-02-06 – 2016-02-08 (×3): 17 g via ORAL
  Filled 2016-02-06 (×3): qty 1

## 2016-02-06 MED ORDER — BISACODYL 5 MG PO TBEC
10.0000 mg | DELAYED_RELEASE_TABLET | Freq: Every day | ORAL | Status: DC | PRN
Start: 2016-02-06 — End: 2016-02-08
  Administered 2016-02-06: 10 mg via ORAL
  Filled 2016-02-06: qty 2

## 2016-02-06 MED ORDER — HYDROMORPHONE HCL 2 MG/ML IJ SOLN
0.5000 mg | INTRAMUSCULAR | Status: DC | PRN
  Administered 2016-02-06: 0.5 mg via INTRAVENOUS
  Filled 2016-02-06: qty 1

## 2016-02-06 NOTE — Progress Notes (Signed)
PT Cancellation Note  Patient Details Name: Traci [name] MRN: [id number] DOB: [birth year]   Cancelled Treatment:    Reason Eval/Treat Not Completed: Patient declined, no reason specified;Fatigue/lethargy limiting ability to participate.  PT Attempted treatment x 2 attempts, pt refuses both attempts stating "I just don't feel good today, I feel tired". PT attempted to educate pt on importance of mobility to gaining strength, pt continues to refuse.    Andie Mortimer 02/06/2016, 9:56 AM

## 2016-02-06 NOTE — Care Management Important Message (Signed)
Important Message  Patient Details  Name: Traci [name] MRN: [id number] Date of Birth: [birth year]   Medicare Important Message Given:  Yes    Orbie Pyo 02/06/2016, 11:28 AM

## 2016-02-06 NOTE — Progress Notes (Signed)
Met with pt this morning to discuss dc plans.  She asks that I call her daughter, Raylene Everts, to discuss home arrangements. (phone [phone]).  Pt does state that her daughter in law and neighbor will be able to provide assistance at dc.    Called pt's daughter to discuss home arrangements.  Daughter states that pt's daughter in law will not be able to provide care, and she is unsure about her neighbor.  All family members currently working.  She states she will likely have to hire someone to stay with pt at dc for a while.  Ms. Bayard Males states pt has "some dementia," and has become difficult to care for over recent months.  She states pt fell in the morning, and did not notify her of fall until 3pm.  Informed daughter that we will be able to set up HHPT, but [facility] would be out of pocket.  Offered private duty list for reference, but daughter states she knows some agencies in [place] she can call.  She states pt has RW, but will need 3 in 1 [facility] for home.  Will arrange [facility] and DME as recommended.  Daughter to arrange 24h supervision in the home.    Reinaldo Raddle, RN, BSN  Trauma/Neuro ICU Case Manager [phone]

## 2016-02-06 NOTE — Evaluation (Signed)
Occupational Therapy Evaluation Patient Details Name: Traci [name] MRN: [id number] DOB: October 10, 1934 Today's Date: 02/06/2016    History of Present Illness This is a 82 yo female with multiple medical issues who is anticoagulated on Coumadin for atrial fibrillation who apparently fell down on her buttocks in her kitchen this morning.  The fall was not witnessed.  Family members helped her up.  This occurred about 9 AM.  About five hours later, she was complaining of some pain in her tailbone, so her family brought her to [facility] ED.  Plain films were unremarkable, but a CT scan showed a large pelvic hematoma and a non-displaced coccygeal fracture.    Clinical Impression   Patient presenting with decreased I in self care, balance, functional transfers/mobility, activity tolerance, and safety awareness.  Patient reports living alone and being independent PTA. Pt reports she has several family members nearby and mentioned two of them that could stay with her 24/7 at discharge. Patient currently functioning at min A. Patient will benefit from acute OT to increase overall independence in the areas of ADLs, functional mobility, and safety in order to safely discharge home.    Follow Up Recommendations  Supervision/Assistance - 24 hour;No OT follow up    Equipment Recommendations  3 in 1 bedside commode    Recommendations for Other Services       Precautions / Restrictions Precautions Precautions: Fall Restrictions Weight Bearing Restrictions: No      Mobility Bed Mobility Overal bed mobility: Needs Assistance Bed Mobility: Rolling;Sidelying to Sit Rolling: Modified independent (Device/Increase time) Sidelying to sit: Supervision;Min guard       General bed mobility comments: increased time secondary to pain  Transfers Overall transfer level: Needs assistance Equipment used: Rolling walker (2 wheeled) Transfers: Sit to/from Stand Sit to Stand: Min assist         General  transfer comment: steadying assistance with standing from bed with RW. min verbal cues for hand placement and proper technique    Balance Overall balance assessment: Needs assistance Sitting-balance support: Feet supported;Bilateral upper extremity supported Sitting balance-Leahy Scale: Fair     Standing balance support: Bilateral upper extremity supported Standing balance-Leahy Scale: Fair Standing balance comment: reliance on RW              ADL Overall ADL's : Needs assistance/impaired     Grooming: Wash/dry hands;Brushing hair;Min guard;Standing      Lower Body Dressing: Set up;Min guard Lower Body Dressing Details (indicate cue type and reason): Pt donning B socks while seated on EOB by crossing LE over to opposite knee.        General ADL Comments: Pt requires increased time and min guard for safety with ADLs secondary to increased pain with movement.               Pertinent Vitals/Pain Pain Assessment: 0-10 Pain Score: 6  Pain Location: buttocks Pain Descriptors / Indicators: Aching Pain Intervention(s): Monitored during session;Repositioned     Hand Dominance Right   Extremity/Trunk Assessment Upper Extremity Assessment Upper Extremity Assessment: Overall WFL for tasks assessed   Lower Extremity Assessment Lower Extremity Assessment: Overall WFL for tasks assessed   Cervical / Trunk Assessment Cervical / Trunk Assessment: Normal   Communication Communication Communication: No difficulties   Cognition Arousal/Alertness: Awake/alert Behavior During Therapy: WFL for tasks assessed/performed Overall Cognitive Status: Within Functional Limits for tasks assessed  Home Living Family/patient expects to be discharged to:: Private residence Living Arrangements: Alone Available Help at Discharge: Family;Available 24 hours/day Type of Home: House Home Access: Stairs to enter [employer] of Steps: 2 Entrance  Stairs-Rails: Right Home Layout: One level     Bathroom Shower/Tub: Occupational psychologist: Standard     Home Equipment: Environmental consultant - 2 wheels   Additional Comments: pt reports her dtr-in-law and sister can stay with her      Prior Functioning/Environment Level of Independence: Independent        Comments: drives        OT Problem List: Decreased strength;Decreased activity tolerance;Impaired balance (sitting and/or standing);Decreased safety awareness;Pain;Decreased knowledge of use of DME or AE   OT Treatment/Interventions: Self-care/ADL training;Therapeutic exercise;Energy conservation;DME and/or AE instruction;Therapeutic activities;Balance training;Patient/family education;Manual therapy    OT Goals(Current goals can be found in the care plan section) Acute Rehab OT Goals Patient Stated Goal: to go home and feel better OT Goal Formulation: With patient Time For Goal Achievement: 02/20/16 Potential to Achieve Goals: Fair ADL Goals Pt Will Perform Grooming: with supervision Pt Will Perform Upper Body Bathing: with supervision Pt Will Perform Lower Body Bathing: with supervision Pt Will Perform Upper Body Dressing: with supervision Pt Will Perform Lower Body Dressing: with supervision Pt Will Transfer to Toilet: with supervision Pt Will Perform Toileting - Clothing Manipulation and hygiene: with supervision Pt Will Perform Tub/Shower Transfer: with supervision  OT Frequency: Min 2X/week              End of Session Equipment Utilized During Treatment: Rolling walker  Activity Tolerance: Patient tolerated treatment well Patient left: in bed;with call bell/phone within reach   Time: 0820-0837 OT Time Calculation (min): 17 min Charges:  OT General Charges $OT Visit: 1 Procedure OT Evaluation $OT Eval Moderate Complexity: 1 Procedure G-Codes:    Ilyas Lipsitz P, MS, OTR/L, CBIS 02/06/2016, 10:10 AM

## 2016-02-06 NOTE — Progress Notes (Signed)
Patient ID: Traci [name], female   DOB: 1935-01-03, 82 y.o.   MRN: [id number]   LOS: 4 days   Subjective: Weak but NSC   Objective: Vital signs in last 24 hours: Temp:  [97.6 F (36.4 C)-98.4 F (36.9 C)] 97.6 F (36.4 C) (01/10 0459) Pulse Rate:  [60-73] 66 (01/10 0459) Resp:  [14-29] 18 (01/10 0459) BP: (126-168)/(62-107) 168/78 (01/10 0459) SpO2:  [86 %-100 %] 97 % (01/10 0459) Last BM Date:  (pt reports before last Saturday 1/6)   Laboratory  CBC  Recent Labs  02/05/16 0308 02/06/16 0357  WBC 12.5* 12.4*  HGB 9.3* 9.4*  HCT 28.4* 28.9*  PLT 234 268   CBG (last 3)   Recent Labs  02/06/16 0035 02/06/16 0420 02/06/16 0804  GLUCAP 90 130* 134*    Physical Exam General appearance: alert and no distress Resp: clear to auscultation bilaterally Cardio: irregularly irregular rhythm GI: normal findings: bowel sounds normal and soft, non-tender   Assessment/Plan: Ground level fall Coccyx FX with large pelvic hematoma - Hb stable, mobilize ABL anemia - due to above, stabilized A fib/HTN/CAD - home lopressor, Cozaar, Norvasc FEN - cardiac diet DM - SSI VTE - PAS Dispo - Likely home this afternoon after OT eval    Lisette Abu, PA-C Pager: [phone] General Trauma PA Pager: [phone]  02/06/2016

## 2016-02-07 ENCOUNTER — Inpatient Hospital Stay (HOSPITAL_COMMUNITY): Payer: PPO

## 2016-02-07 LAB — CBC
HCT: 31.5 % — ABNORMAL LOW (ref 36.0–46.0)
Hemoglobin: 10.6 g/dL — ABNORMAL LOW (ref 12.0–15.0)
MCH: 31.8 pg (ref 26.0–34.0)
MCHC: 33.7 g/dL (ref 30.0–36.0)
MCV: 94.6 fL (ref 78.0–100.0)
PLATELETS: 352 10*3/uL (ref 150–400)
RBC: 3.33 MIL/uL — ABNORMAL LOW (ref 3.87–5.11)
RDW: 14.6 % (ref 11.5–15.5)
WBC: 16.7 10*3/uL — ABNORMAL HIGH (ref 4.0–10.5)

## 2016-02-07 LAB — GLUCOSE, CAPILLARY
GLUCOSE-CAPILLARY: 131 mg/dL — AB (ref 65–99)
GLUCOSE-CAPILLARY: 133 mg/dL — AB (ref 65–99)
GLUCOSE-CAPILLARY: 133 mg/dL — AB (ref 65–99)
GLUCOSE-CAPILLARY: 144 mg/dL — AB (ref 65–99)
GLUCOSE-CAPILLARY: 184 mg/dL — AB (ref 65–99)
Glucose-Capillary: 125 mg/dL — ABNORMAL HIGH (ref 65–99)

## 2016-02-07 MED ORDER — INSULIN ASPART 100 UNIT/ML ~~LOC~~ SOLN
0.0000 [IU] | Freq: Three times a day (TID) | SUBCUTANEOUS | Status: DC
  Administered 2016-02-07 – 2016-02-08 (×2): 1 [IU] via SUBCUTANEOUS

## 2016-02-07 MED ORDER — HYDRALAZINE HCL 20 MG/ML IJ SOLN
10.0000 mg | INTRAMUSCULAR | Status: DC | PRN
  Administered 2016-02-07: 10 mg via INTRAVENOUS
  Filled 2016-02-07: qty 1

## 2016-02-07 NOTE — Progress Notes (Signed)
Patient ID: Arman Filter, female   DOB: 06/05/1934, 82 y.o.   MRN: [id number]   LOS: 5 days   Subjective: Still feeling very weak today, denies pain, SOB, N/V.   Objective: Vital signs in last 24 hours: Temp:  [98 F (36.7 C)-98.2 F (36.8 C)] 98 F (36.7 C) (01/11 0540) Pulse Rate:  [67-87] 72 (01/11 0540) Resp:  [18-19] 18 (01/11 0540) BP: (138-188)/(69-88) 183/88 (01/11 0540) SpO2:  [94 %-97 %] 94 % (01/11 0540) Last BM Date:  (pt reports before last Saturday 1/6)   Physical Exam General appearance: alert and no distress Resp: clear to auscultation bilaterally Cardio: regular rate and rhythm GI: normal findings: bowel sounds normal and soft, non-tender Extremities: NVI   Assessment/Plan: Ground level fall Coccyx FX with large pelvic hematoma- Hb stable, mobilize ABL anemia- Check CBC with new weakness A fib/HTN/CAD- home lopressor, Cozaar, Norvasc DM- SSI FEN- cardiac diet VTE- PAS Dispo- Reassess this afternoon for possible discharge    Lisette Abu, PA-C Pager: [phone] General Trauma PA Pager: [phone]  02/07/2016

## 2016-02-07 NOTE — Progress Notes (Signed)
Pt's bp this am is 183/88, PR 72 called MD with order to give hydralazine 10mg  IV.

## 2016-02-07 NOTE — Progress Notes (Signed)
Occupational Therapy Treatment Patient Details Name: Traci [name] MRN: [id number] DOB: 05-30-1934 Today's Date: 02/07/2016    History of present illness This is a 82 yo female with multiple medical issues who is anticoagulated on Coumadin for atrial fibrillation who apparently fell down on her buttocks in her kitchen this morning.  The fall was not witnessed.  Family members helped her up.  This occurred about 9 AM.  About five hours later, she was complaining of some pain in her tailbone, so her family brought her to [facility] ED.  Plain films were unremarkable, but a CT scan showed a large pelvic hematoma and a non-displaced coccygeal fracture.    OT comments  Pt admitted with the above diagnosis. Pt supine in bed upon entering the room this session with no c/o pain this session. Pt is agreeable to OT intervention. Pt performed supine >sit with supervision. Pt ambulating with RW and min guard with min cues for safety. Pt performed toileting with steady assistance for clothing management and hygiene. Pt standing at sink for hand hygiene with close supervision. OT educated and demonstrate use of RW and shower chair for simulated walk in shower transfer with threshold. Pt returned demonstration with steady assistance for balance and min cues for proper technique and hand placement. Pt returned to bed at end of session. Call bell and all needed items within reach upon exiting the room.    Follow Up Recommendations  Supervision/Assistance - 24 hour;No OT follow up    Equipment Recommendations  3 in 1 bedside commode    Recommendations for Other Services      Precautions / Restrictions Precautions Precautions: Fall       Mobility Bed Mobility Overal bed mobility: Needs Assistance Bed Mobility: Rolling;Sidelying to Sit;Sit to Sidelying Rolling: Modified independent (Device/Increase time) Sidelying to sit: Supervision     Sit to sidelying: Supervision General bed mobility comments:  increased time secondary to pain  Transfers Overall transfer level: Needs assistance Equipment used: Rolling walker (2 wheeled) Transfers: Sit to/from Stand Sit to Stand: Min guard         General transfer comment: steadying assistance with standing from bed with RW. min verbal cues for hand placement and proper technique    Balance Overall balance assessment: Needs assistance Sitting-balance support: Feet supported;Bilateral upper extremity supported Sitting balance-Leahy Scale: Fair     Standing balance support: Single extremity supported;During functional activity;No upper extremity supported Standing balance-Leahy Scale: Fair                     ADL Overall ADL's : Needs assistance/impaired     Grooming: Wash/dry hands;Supervision/safety;Standing                   Toilet Transfer: Min guard;Comfort height toilet;Ambulation;RW   Toileting- Water quality scientist and Hygiene: Min guard;Sit to/from stand                Vision                     Perception     Praxis      Cognition   Behavior During Therapy: [facility] for tasks assessed/performed Overall Cognitive Status: Within Functional Limits for tasks assessed                       Extremity/Trunk Assessment               Exercises     Shoulder Instructions  General Comments      Pertinent Vitals/ Pain       Pain Assessment: 0-10 Pain Score: 2  Pain Location: buttocks Pain Descriptors / Indicators: Sore Pain Intervention(s): Repositioned  Home Living                                          Prior Functioning/Environment              Frequency  Min 2X/week        Progress Toward Goals  OT Goals(current goals can now be found in the care plan section)  Progress towards OT goals: Progressing toward goals     Plan Discharge plan remains appropriate    Co-evaluation                 End of Session Equipment  Utilized During Treatment: Rolling walker   Activity Tolerance Patient tolerated treatment well   Patient Left in bed;with call bell/phone within reach   Nurse Communication          Time: [id number] OT Time Calculation (min): 23 min  Charges: OT General Charges $OT Visit: 1 Procedure OT Treatments $Self Care/Home Management : 23-37 mins  Gypsy Decant 02/07/2016, 3:43 PM

## 2016-02-07 NOTE — Progress Notes (Signed)
Physical Therapy Treatment Patient Details Name: Traci [name] MRN: [id number] DOB: [birth year] Today's Date: 02/07/2016    History of Present Illness This is a 82 yo female with multiple medical issues who is anticoagulated on Coumadin for atrial fibrillation who apparently fell down on her buttocks in her kitchen this morning.  The fall was not witnessed.  Family members helped her up.  This occurred about 9 AM.  About five hours later, she was complaining of some pain in her tailbone, so her family brought her to [facility] ED.  Plain films were unremarkable, but a CT scan showed a large pelvic hematoma and a non-displaced coccygeal fracture.     PT Comments    Pt admitted with above diagnosis. Pt currently with functional limitations due to balance and endurance deficits. Pt was able to ambulate on unit with RW with min assist to min guard assist.  Still needed assist with toileting as well as guard assist for brushing teeth and ADL.  Plan is to have 24 hour care at home.   Pt will benefit from skilled PT to increase their independence and safety with mobility to allow discharge to the venue listed below.    Follow Up Recommendations  Supervision/Assistance - 24 hour;Home health PT     Equipment Recommendations   (has RW)    Recommendations for Other Services       Precautions / Restrictions Precautions Precautions: Fall Restrictions Weight Bearing Restrictions: No    Mobility  Bed Mobility Overal bed mobility: Needs Assistance Bed Mobility: Rolling;Sidelying to Sit Rolling: Modified independent (Device/Increase time) Sidelying to sit: Supervision       General bed mobility comments: increased time secondary to pain  Transfers Overall transfer level: Needs assistance Equipment used: Rolling walker (2 wheeled) Transfers: Sit to/from Stand Sit to Stand: Min guard         General transfer comment: steadying assistance with standing from bed with RW. min verbal cues for  hand placement and proper technique  Ambulation/Gait Ambulation/Gait assistance: Min guard Ambulation Distance (Feet): 375 Feet Assistive device: Rolling walker (2 wheeled) Gait Pattern/deviations: Step-through pattern;Decreased stride length Gait velocity: slow Gait velocity interpretation: Below normal speed for age/gender General Gait Details: slow and guarded but overall fair safety with RW.  Ambulated into bathroom and urinated in toilet.  Needed total assist to clean after using bathroom.  Pt did walk to sink and brush teeth and was abl eto stand and balance at sink .     Stairs            Wheelchair Mobility    Modified Rankin (Stroke Patients Only)       Balance Overall balance assessment: Needs assistance Sitting-balance support: Feet supported;Bilateral upper extremity supported Sitting balance-Leahy Scale: Fair     Standing balance support: Single extremity supported;During functional activity;No upper extremity supported Standing balance-Leahy Scale: Fair Standing balance comment: reliance on RW for dynamic.  Stood to brush teeth at sink without UE support for up to 3 minutes. PT providing min guard assist.                     Cognition Arousal/Alertness: Awake/alert Behavior During Therapy: WFL for tasks assessed/performed Overall Cognitive Status: Within Functional Limits for tasks assessed                      Exercises      General Comments        Pertinent Vitals/Pain Pain Assessment:  0-10 Pain Score: 4  Pain Location: buttocks Pain Descriptors / Indicators: Sore Pain Intervention(s): Limited activity within patient's tolerance;Monitored during session;Repositioned   VSS Home Living                      Prior Function            PT Goals (current goals can now be found in the care plan section) Progress towards PT goals: Progressing toward goals    Frequency    Min 3X/week      PT Plan Current plan  remains appropriate    Co-evaluation             End of Session Equipment Utilized During Treatment: Gait belt Activity Tolerance: Patient tolerated treatment well Patient left: in bed;with bed alarm set;with call bell/phone within reach     Time: 1002-1025 PT Time Calculation (min) (ACUTE ONLY): 23 min  Charges:  $Gait Training: 8-22 mins $Self Care/Home Management: 8-22                    G Codes:      Godfrey Pick Ronte Parker 15-Feb-2016, 11:31 AM  [facility] [phone] (pager)

## 2016-02-07 NOTE — Care Management Note (Signed)
Case Management Note  Patient Details  Name: JAMARIA CUFFIE MRN: [id number] Date of Birth: 03/05/1934  Subjective/Objective:  Pt for likely dc home tomorrow with [facility].                    Action/Plan: Referral to [facility] for [facility] follow up and DME, per pt/daughter choice.  Start of care 24-48h post dc date.  3 in 1 to be delivered to pt's room prior to dc.    Expected Discharge Date:                  Expected Discharge Plan:  [place]  In-House Referral:     Discharge planning Services  CM Consult  Post Acute Care Choice:  Home Health Choice offered to:  Adult Children, Patient  DME Arranged:  3-N-1 DME Agency:  [place] Arranged:  PT, OT [place] Agency:  [place]  Status of Service:  Completed, signed off  If discussed at [place] of Stay Meetings, dates discussed:    Additional Comments:  Reinaldo Raddle, RN, BSN  Trauma/Neuro ICU Case Manager [phone]

## 2016-02-08 LAB — CBC
HCT: 32.5 % — ABNORMAL LOW (ref 36.0–46.0)
HEMOGLOBIN: 10.8 g/dL — AB (ref 12.0–15.0)
MCH: 31.5 pg (ref 26.0–34.0)
MCHC: 33.2 g/dL (ref 30.0–36.0)
MCV: 94.8 fL (ref 78.0–100.0)
PLATELETS: 341 10*3/uL (ref 150–400)
RBC: 3.43 MIL/uL — AB (ref 3.87–5.11)
RDW: 14.8 % (ref 11.5–15.5)
WBC: 13.5 10*3/uL — AB (ref 4.0–10.5)

## 2016-02-08 LAB — URINALYSIS, ROUTINE W REFLEX MICROSCOPIC
Bilirubin Urine: NEGATIVE
GLUCOSE, UA: NEGATIVE mg/dL
Hgb urine dipstick: NEGATIVE
Ketones, ur: NEGATIVE mg/dL
Leukocytes, UA: NEGATIVE
NITRITE: NEGATIVE
PROTEIN: 30 mg/dL — AB
Specific Gravity, Urine: 1.016 (ref 1.005–1.030)
Squamous Epithelial / LPF: NONE SEEN
pH: 6 (ref 5.0–8.0)

## 2016-02-08 LAB — GLUCOSE, CAPILLARY
Glucose-Capillary: 127 mg/dL — ABNORMAL HIGH (ref 65–99)
Glucose-Capillary: 106 mg/dL — ABNORMAL HIGH (ref 65–99)

## 2016-02-08 NOTE — Progress Notes (Signed)
Patient discharged to home with instructions. 

## 2016-02-08 NOTE — Clinical Social Work Note (Signed)
Clinical Social Work Assessment  Patient Details  Name: Traci [name] MRN: [id number] Date of Birth: 1934/08/30  Date of referral:  02/08/16               Reason for consult:  Trauma                Permission sought to share information with:  Family Supports Permission granted to share information::     Name::     Traci [name] [id number]  Agency::     Relationship::     Contact Information:     Housing/Transportation Living arrangements for the past 2 months:  Single Family Home Source of Information:  Patient Patient Interpreter Needed:  None Criminal Activity/Legal Involvement Pertinent to Current Situation/Hospitalization:  No - Comment as needed Significant Relationships:  Adult Children Lives with:  Self Do you feel safe going back to the place where you live?  Yes Need for family participation in patient care:  Yes (Comment)  Care giving concerns:  Pt resides alone, however, dtr-in-law and son live on pt's property. Pt also has another dtr local who is willing to assist pt as needed.   Social Worker assessment / plan: CSW met with pt @ bedside to offer support and discuss patient needs at discharge. Pt pleasant, alert and oriented X4. Pt lives alone in a single family home, pt will go home and get support from her dtr-in-law and son that live on her property. Pt also has another dtr local that is willing to assist pt as needed. Pt reports prior to hospitalization she was independently with all ADL's and used a cane or walker to ambulate. Pt also owns a [employer].  Pt reported that she fell in her living room and can't remember what lead up to the fall. Pt reports that she felt fine and 4 hrs later her tail bone became painful. Pt understands that it is imperative that she use walker or cane at all times to get around her home. Pt reports no hx of MH or Sub Abuse. SBIRT complete. No further social work needs identified.  Employment status:  Retired Forensic scientist:    ([employer]) PT Recommendations:  24 Hour Supervision, Home with [place] / Referral to community resources:     Patient/Family's Response to care:  Pt agreeable to return home with family helping her as needed. Pt does not express concerns with nightmares/flashbacks at this time.  Patient/Family's Understanding of and Emotional Response to Diagnosis, Current Treatment, and Prognosis:  Pt understanding of her current dx and potential long term recovery. Pt with good family support who plans to be present for physical and emotional support at discharge.  Emotional Assessment Appearance:  Appears younger than stated age Attitude/Demeanor/Rapport:   (Pleasant) Affect (typically observed):  Accepting Orientation:  Oriented to Self, Oriented to Place, Oriented to  Time, Oriented to Situation Alcohol / Substance use:  Never Used Psych involvement (Current and /or in the community):  No (Comment)  Discharge Needs  Concerns to be addressed:  Home Safety Concerns Readmission within the last 30 days:  No Current discharge risk:  Lives alone Barriers to Discharge:  Continued Medical Work up   [place], [place], [place] 02/08/2016, 11:35 AM

## 2016-02-08 NOTE — Progress Notes (Signed)
Patient ID: Traci [name], female   DOB: [birth year], 82 y.o.   MRN: [id number]   LOS: 6 days   Subjective: Feels about the same but ready to go home   Objective: Vital signs in last 24 hours: Temp:  [97.6 F (36.4 C)-98.8 F (37.1 C)] 98.5 F (36.9 C) (01/12 0540) Pulse Rate:  [64-83] 64 (01/12 0540) Resp:  [17-18] 17 (01/12 0540) BP: (132-159)/(57-65) 159/65 (01/12 0540) SpO2:  [93 %] 93 % (01/12 0540) Last BM Date:  (pt reports before last Saturday 1/6)   Laboratory  CBC  Recent Labs  02/06/16 0357 02/07/16 1019  WBC 12.4* 16.7*  HGB 9.4* 10.6*  HCT 28.9* 31.5*  PLT 268 352    Physical Exam General appearance: alert and no distress Resp: clear to auscultation bilaterally Cardio: regular rate and rhythm GI: normal findings: bowel sounds normal and soft, non-tender   Assessment/Plan: Ground level fall Coccyx FX with large pelvic hematoma- Hb stable, mobilize ABL anemia- Check CBC  A fib/HTN/CAD- home lopressor, Cozaar, Norvasc DM- SSI FEN- Awaiting UA VTE- PAS Dispo- Home today once UA results    Lisette Abu, PA-C Pager: [phone] General Trauma PA Pager: [phone]  02/08/2016

## 2016-02-08 NOTE — Discharge Instructions (Signed)
Do not restart your coumadin yet. Please see your primary care physician early next week and they will let you know when to restart it.   Tailbone Injury The tailbone (coccyx) is the small bone at the lower end of the spine. A tailbone injury may involve stretched ligaments, bruising, or a broken bone (fracture). Tailbone injuries can be painful, and some may take a long time to heal. What are the causes? This condition is often caused by falling and landing on the tailbone. Other causes include:  Repeated strain or friction from actions such as rowing and bicycling.  Childbirth. In some cases, the cause may not be known. What increases the risk? This condition is more common in women than in men. What are the signs or symptoms? Symptoms of this condition include:  Pain in the lower back, especially when sitting.  Pain or difficulty when standing up from a sitting position.  Bruising in the tailbone area.  Painful bowel movements.  In women, pain during intercourse. How is this diagnosed? This condition may be diagnosed based on your symptoms and a physical exam. X-rays may be taken if a fracture is suspected. You may also have other tests, such as a CT scan or MRI. How is this treated? This condition may be treated with medicines to help relieve your pain. Most tailbone injuries heal on their own in 4-6 weeks. However, recovery time may be longer if the injury involves a fracture. Follow these instructions at home:  Take medicines only as directed by your health care provider.  If directed, apply ice to the injured area:  Put ice in a plastic bag.  Place a towel between your skin and the bag.  Leave the ice on for 20 minutes, 2-3 times per day for the first 1-2 days.  Sit on a large, rubber or inflated ring or cushion to ease your pain. Lean forward when you are sitting to help decrease discomfort.  Avoid sitting for long periods of time.  Increase your activity as the  pain allows. Perform any exercises that are recommended by your health care provider or physical therapist.  If you have pain during bowel movements, use stool softeners as directed by your health care provider.  Eat a diet that includes plenty of fiber to help prevent constipation.  Keep all follow-up visits as directed by your health care provider. This is important. How is this prevented? Wear appropriate padding and sports gear when bicycling and rowing. This can help to prevent developing an injury that is caused by repeated strain or friction. Contact a health care provider if:  Your pain becomes worse.  Your bowel movements cause a great deal of discomfort.  You are unable to have a bowel movement.  You have uncontrolled urine loss (urinary incontinence).  You have a fever. This information is not intended to replace advice given to you by your health care provider. Make sure you discuss any questions you have with your health care provider. Document Released: 01/11/2000 Document Revised: 09/13/2015 Document Reviewed: 01/09/2014 Elsevier Interactive Patient Education  2017 [employer].

## 2016-02-08 NOTE — Discharge Summary (Signed)
[facility] Discharge Summary   Patient ID: Traci [name] MRN: [id number] DOB/AGE: 1934-06-29 82 y.o.  Admit date: 02/02/2016 Discharge date: 02/08/2016  Admitting Diagnosis: Pelvic hematoma Leukocytosis  Discharge Diagnosis Patient Active Problem List   Diagnosis Date Noted  . Fall 02/06/2016  . Acute blood loss anemia 02/06/2016  . Closed fracture of coccyx ([facility]) 02/06/2016  . Female pelvic hematoma 02/02/2016  . Chest pain at rest 10/17/2014  . Chronic diastolic heart failure ([place]) 10/14/2014  . Peripheral neuropathy ([place]) 08/31/2013  . Leg swelling 08/31/2013  . Encounter for monitoring coumadin therapy 04/06/2013  . CAD (coronary artery disease) 04/06/2013  . OA (osteoarthritis) 04/06/2013  . Other malaise and fatigue 04/06/2013  . Essential hypertension, benign 04/06/2013  . Atrial fibrillation ([place]) 06/11/2010  . HYPERLIPIDEMIA 02/26/2010  . Cardiac pacemaker in situ 06/13/2009    Consultants None  Imaging: Dg Chest 2 View  Result Date: 02/07/2016 CLINICAL DATA:  Leukocytosis EXAM: CHEST  2 VIEW COMPARISON:  October 13, 2014 FINDINGS: There is a rather minimal left pleural effusion. Lungs elsewhere are clear. There is cardiomegaly with pulmonary vascularity within normal limits. Pacemaker leads are attached to the right atrium and middle cardiac vein. There is atherosclerotic calcification in the aorta. No pneumothorax. No adenopathy. There is a healed fracture of the posterior right fifth rib. There are surgical clips in the right lower cervical region. IMPRESSION: No edema or consolidation. Rather minimal pleural effusion on the left. There is cardiomegaly with aortic atherosclerosis. Pacemaker leads attached to right atrium and middle cardiac vein. No pneumothorax. Electronically Signed   By: Lowella Grip III M.D.   On: 02/07/2016 13:05   CT abdomen pelvis w contrast 02/02/16: Large apparent hematoma with active arterial bleeding within this  hematoma arising from an arterial vessel posteriorly. This apparent hematoma measures 8.1 x 8.0 x 9.4 cm. It displaces the rectum and lower sigmoid colon. There is fluid consistent with loculated ascites in the pelvis tracking upward into the retroperitoneum to the level of the aortic bifurcation. There is inflammation of the distal sigmoid colon with stranding in the soft tissues in this area. Nondisplaced coccyx fracture. Old fracture with healing right ischium. No bowel obstruction.  No abscess. Chronic hydronephrosis and ureterectasis on the left with urinary bladder wall thickening. Question chronic stenosis at the ureterovesical junction on the left. This finding may warrant cystoscopic evaluation to further assess. Nonobstructing calculi left kidney. Hepatic steatosis. Small left adrenal mass, stable. Cardiomegaly. Atelectasis with questionable small focus of pneumonia left base. There is extensive aortoiliac atherosclerosis. The hematoma is in the posterior pelvis anterior to the sacrum and coccyx. It is impressing upon the rectum and lower sigmoid colon prominence location in the posterior pelvis. It is separate from and does not involve the gluteal muscles.  Procedures None  Hospital Course:  Traci [name] is a 82yo female with multiple medical issues who is anticoagulated on Coumadin for atrial fibrillation, who was transferred to [facility] from [facility] 02/02/16 after sustaining a ground level fall and landing on her buttocks.  Workup showed a large pelvic hematoma and a non-displaced coccygeal fracture. Coumadin was reversed with vitamin K. Patient was admitted for bed rest and to monitor her hemoglobin. Hemoglobin remained stable therefore on 02/05/16 her activity was advanced. WBC began to rise but then improved; CXR and u/a were both negative. On 02/08/16 the patient was voiding well, tolerating diet, ambulating well, pain well controlled, vital signs stable and felt stable for discharge home.  She will have home PT/OT.  Physical Exam: General appearance: alert and no distress Resp: clear to auscultation bilaterally Cardio: regular rate and rhythm GI: normal findings: bowel sounds normal and soft, non-tender  Allergies as of 02/08/2016      Reactions   Morphine Nausea Only   Penicillins Other (See Comments)   Has patient had a PCN reaction causing immediate rash, facial/tongue/throat swelling, SOB or lightheadedness with hypotension: NO Has patient had a PCN reaction causing severe rash involving mucus membranes or skin necrosis: no Has patient had a PCN reaction that required hospitalization: NO Has patient had a PCN reaction occurring within the last 10 years: NO If all of the above answers are "NO", then may proceed with Cephalosporin use.      Medication List    STOP taking these medications   warfarin 5 MG tablet Commonly known as:  COUMADIN     TAKE these medications   amLODipine 5 MG tablet Commonly known as:  NORVASC Take 1 tablet (5 mg total) by mouth daily. What changed:  when to take this   blood glucose meter kit and supplies Dispense based on patient and insurance preference. Use to monitor FSBS one time daily as directed. DX: 250.00   citalopram 40 MG tablet Commonly known as:  CELEXA Take 40 mg by mouth every morning.   digoxin 0.125 MG tablet Commonly known as:  LANOXIN Take 1 tablet (125 mcg total) by mouth every morning.   escitalopram 10 MG tablet Commonly known as:  LEXAPRO Take 10 mg by mouth at bedtime.   LORazepam 0.5 MG tablet Commonly known as:  ATIVAN Take 0.5 mg by mouth at bedtime.   losartan 50 MG tablet Commonly known as:  COZAAR TAKE 1 TABLET(50 MG) BY MOUTH TWICE DAILY What changed:  See the new instructions.   metFORMIN 500 MG tablet Commonly known as:  GLUCOPHAGE Take 500 mg by mouth 2 (two) times daily with a meal.   metoprolol tartrate 25 MG tablet Commonly known as:  LOPRESSOR Take 25 mg by mouth 2 (two) times  daily.            Durable Medical Equipment        Start     Ordered   02/07/16 1458  For home use only DME 3 n 1  Once     02/07/16 1457       Follow-up [place] Follow up.   Why:  Physical and occupational therapist will follow up with you at home.   Contact information: [address] [place] [phone]          Signed: Jerrye Beavers, [facility] 02/08/2016, 11:28 AM Pager: [phone] Consults: [phone] Mon-Fri 7:00 am-4:30 pm Sat-Sun 7:00 am-11:30 am

## 2016-02-09 DIAGNOSIS — I4891 Unspecified atrial fibrillation: Secondary | ICD-10-CM | POA: Diagnosis not present

## 2016-02-09 DIAGNOSIS — E785 Hyperlipidemia, unspecified: Secondary | ICD-10-CM | POA: Diagnosis not present

## 2016-02-09 DIAGNOSIS — D62 Acute posthemorrhagic anemia: Secondary | ICD-10-CM | POA: Diagnosis not present

## 2016-02-09 DIAGNOSIS — S322XXD Fracture of coccyx, subsequent encounter for fracture with routine healing: Secondary | ICD-10-CM | POA: Diagnosis not present

## 2016-02-09 DIAGNOSIS — I110 Hypertensive heart disease with heart failure: Secondary | ICD-10-CM | POA: Diagnosis not present

## 2016-02-09 DIAGNOSIS — I10 Essential (primary) hypertension: Secondary | ICD-10-CM | POA: Diagnosis not present

## 2016-02-09 DIAGNOSIS — I482 Chronic atrial fibrillation: Secondary | ICD-10-CM | POA: Diagnosis not present

## 2016-02-09 DIAGNOSIS — Z950 Presence of cardiac pacemaker: Secondary | ICD-10-CM | POA: Diagnosis not present

## 2016-02-09 DIAGNOSIS — I5032 Chronic diastolic (congestive) heart failure: Secondary | ICD-10-CM | POA: Diagnosis not present

## 2016-02-09 DIAGNOSIS — M1991 Primary osteoarthritis, unspecified site: Secondary | ICD-10-CM | POA: Diagnosis not present

## 2016-02-09 DIAGNOSIS — F39 Unspecified mood [affective] disorder: Secondary | ICD-10-CM | POA: Diagnosis not present

## 2016-02-09 DIAGNOSIS — S3983XD Other specified injuries of pelvis, subsequent encounter: Secondary | ICD-10-CM | POA: Diagnosis not present

## 2016-02-09 DIAGNOSIS — E1140 Type 2 diabetes mellitus with diabetic neuropathy, unspecified: Secondary | ICD-10-CM | POA: Diagnosis not present

## 2016-02-09 DIAGNOSIS — G4709 Other insomnia: Secondary | ICD-10-CM | POA: Diagnosis not present

## 2016-02-09 DIAGNOSIS — I2510 Atherosclerotic heart disease of native coronary artery without angina pectoris: Secondary | ICD-10-CM | POA: Diagnosis not present

## 2016-02-15 DIAGNOSIS — G4709 Other insomnia: Secondary | ICD-10-CM | POA: Diagnosis not present

## 2016-02-15 DIAGNOSIS — I10 Essential (primary) hypertension: Secondary | ICD-10-CM | POA: Diagnosis not present

## 2016-02-15 DIAGNOSIS — Z09 Encounter for follow-up examination after completed treatment for conditions other than malignant neoplasm: Secondary | ICD-10-CM | POA: Diagnosis not present

## 2016-02-15 DIAGNOSIS — I482 Chronic atrial fibrillation: Secondary | ICD-10-CM | POA: Diagnosis not present

## 2016-02-15 DIAGNOSIS — S329XXD Fracture of unspecified parts of lumbosacral spine and pelvis, subsequent encounter for fracture with routine healing: Secondary | ICD-10-CM | POA: Diagnosis not present

## 2016-02-18 DIAGNOSIS — E1140 Type 2 diabetes mellitus with diabetic neuropathy, unspecified: Secondary | ICD-10-CM | POA: Diagnosis not present

## 2016-02-18 DIAGNOSIS — D62 Acute posthemorrhagic anemia: Secondary | ICD-10-CM | POA: Diagnosis not present

## 2016-02-18 DIAGNOSIS — Z950 Presence of cardiac pacemaker: Secondary | ICD-10-CM | POA: Diagnosis not present

## 2016-02-18 DIAGNOSIS — S322XXD Fracture of coccyx, subsequent encounter for fracture with routine healing: Secondary | ICD-10-CM | POA: Diagnosis not present

## 2016-02-18 DIAGNOSIS — I5032 Chronic diastolic (congestive) heart failure: Secondary | ICD-10-CM | POA: Diagnosis not present

## 2016-02-18 DIAGNOSIS — I110 Hypertensive heart disease with heart failure: Secondary | ICD-10-CM | POA: Diagnosis not present

## 2016-02-18 DIAGNOSIS — E785 Hyperlipidemia, unspecified: Secondary | ICD-10-CM | POA: Diagnosis not present

## 2016-02-18 DIAGNOSIS — I2510 Atherosclerotic heart disease of native coronary artery without angina pectoris: Secondary | ICD-10-CM | POA: Diagnosis not present

## 2016-02-18 DIAGNOSIS — I4891 Unspecified atrial fibrillation: Secondary | ICD-10-CM | POA: Diagnosis not present

## 2016-02-18 DIAGNOSIS — S3983XD Other specified injuries of pelvis, subsequent encounter: Secondary | ICD-10-CM | POA: Diagnosis not present

## 2016-02-18 DIAGNOSIS — M1991 Primary osteoarthritis, unspecified site: Secondary | ICD-10-CM | POA: Diagnosis not present

## 2016-02-19 DIAGNOSIS — S3983XD Other specified injuries of pelvis, subsequent encounter: Secondary | ICD-10-CM | POA: Diagnosis not present

## 2016-02-19 DIAGNOSIS — E785 Hyperlipidemia, unspecified: Secondary | ICD-10-CM | POA: Diagnosis not present

## 2016-02-19 DIAGNOSIS — M1991 Primary osteoarthritis, unspecified site: Secondary | ICD-10-CM | POA: Diagnosis not present

## 2016-02-19 DIAGNOSIS — D62 Acute posthemorrhagic anemia: Secondary | ICD-10-CM | POA: Diagnosis not present

## 2016-02-19 DIAGNOSIS — E1140 Type 2 diabetes mellitus with diabetic neuropathy, unspecified: Secondary | ICD-10-CM | POA: Diagnosis not present

## 2016-02-19 DIAGNOSIS — I110 Hypertensive heart disease with heart failure: Secondary | ICD-10-CM | POA: Diagnosis not present

## 2016-02-19 DIAGNOSIS — Z950 Presence of cardiac pacemaker: Secondary | ICD-10-CM | POA: Diagnosis not present

## 2016-02-19 DIAGNOSIS — S322XXD Fracture of coccyx, subsequent encounter for fracture with routine healing: Secondary | ICD-10-CM | POA: Diagnosis not present

## 2016-02-19 DIAGNOSIS — I2510 Atherosclerotic heart disease of native coronary artery without angina pectoris: Secondary | ICD-10-CM | POA: Diagnosis not present

## 2016-02-19 DIAGNOSIS — I5032 Chronic diastolic (congestive) heart failure: Secondary | ICD-10-CM | POA: Diagnosis not present

## 2016-02-19 DIAGNOSIS — I4891 Unspecified atrial fibrillation: Secondary | ICD-10-CM | POA: Diagnosis not present

## 2016-02-21 ENCOUNTER — Encounter: Payer: Self-pay | Admitting: Internal Medicine

## 2016-02-21 DIAGNOSIS — M1991 Primary osteoarthritis, unspecified site: Secondary | ICD-10-CM | POA: Diagnosis not present

## 2016-02-21 DIAGNOSIS — I4891 Unspecified atrial fibrillation: Secondary | ICD-10-CM | POA: Diagnosis not present

## 2016-02-21 DIAGNOSIS — Z950 Presence of cardiac pacemaker: Secondary | ICD-10-CM | POA: Diagnosis not present

## 2016-02-21 DIAGNOSIS — E1140 Type 2 diabetes mellitus with diabetic neuropathy, unspecified: Secondary | ICD-10-CM | POA: Diagnosis not present

## 2016-02-21 DIAGNOSIS — D62 Acute posthemorrhagic anemia: Secondary | ICD-10-CM | POA: Diagnosis not present

## 2016-02-21 DIAGNOSIS — E785 Hyperlipidemia, unspecified: Secondary | ICD-10-CM | POA: Diagnosis not present

## 2016-02-21 DIAGNOSIS — I5032 Chronic diastolic (congestive) heart failure: Secondary | ICD-10-CM | POA: Diagnosis not present

## 2016-02-21 DIAGNOSIS — I2510 Atherosclerotic heart disease of native coronary artery without angina pectoris: Secondary | ICD-10-CM | POA: Diagnosis not present

## 2016-02-21 DIAGNOSIS — I110 Hypertensive heart disease with heart failure: Secondary | ICD-10-CM | POA: Diagnosis not present

## 2016-02-21 DIAGNOSIS — S3983XD Other specified injuries of pelvis, subsequent encounter: Secondary | ICD-10-CM | POA: Diagnosis not present

## 2016-02-21 DIAGNOSIS — S322XXD Fracture of coccyx, subsequent encounter for fracture with routine healing: Secondary | ICD-10-CM | POA: Diagnosis not present

## 2016-02-22 DIAGNOSIS — I482 Chronic atrial fibrillation: Secondary | ICD-10-CM | POA: Diagnosis not present

## 2016-02-22 DIAGNOSIS — E782 Mixed hyperlipidemia: Secondary | ICD-10-CM | POA: Diagnosis not present

## 2016-02-22 DIAGNOSIS — I10 Essential (primary) hypertension: Secondary | ICD-10-CM | POA: Diagnosis not present

## 2016-02-22 DIAGNOSIS — E119 Type 2 diabetes mellitus without complications: Secondary | ICD-10-CM | POA: Diagnosis not present

## 2016-02-22 DIAGNOSIS — G4709 Other insomnia: Secondary | ICD-10-CM | POA: Diagnosis not present

## 2016-02-26 DIAGNOSIS — Z950 Presence of cardiac pacemaker: Secondary | ICD-10-CM | POA: Diagnosis not present

## 2016-02-26 DIAGNOSIS — M1991 Primary osteoarthritis, unspecified site: Secondary | ICD-10-CM | POA: Diagnosis not present

## 2016-02-26 DIAGNOSIS — S322XXD Fracture of coccyx, subsequent encounter for fracture with routine healing: Secondary | ICD-10-CM | POA: Diagnosis not present

## 2016-02-26 DIAGNOSIS — D62 Acute posthemorrhagic anemia: Secondary | ICD-10-CM | POA: Diagnosis not present

## 2016-02-26 DIAGNOSIS — I5032 Chronic diastolic (congestive) heart failure: Secondary | ICD-10-CM | POA: Diagnosis not present

## 2016-02-26 DIAGNOSIS — E785 Hyperlipidemia, unspecified: Secondary | ICD-10-CM | POA: Diagnosis not present

## 2016-02-26 DIAGNOSIS — S3983XD Other specified injuries of pelvis, subsequent encounter: Secondary | ICD-10-CM | POA: Diagnosis not present

## 2016-02-26 DIAGNOSIS — I4891 Unspecified atrial fibrillation: Secondary | ICD-10-CM | POA: Diagnosis not present

## 2016-02-26 DIAGNOSIS — I110 Hypertensive heart disease with heart failure: Secondary | ICD-10-CM | POA: Diagnosis not present

## 2016-02-26 DIAGNOSIS — I2510 Atherosclerotic heart disease of native coronary artery without angina pectoris: Secondary | ICD-10-CM | POA: Diagnosis not present

## 2016-02-26 DIAGNOSIS — E1140 Type 2 diabetes mellitus with diabetic neuropathy, unspecified: Secondary | ICD-10-CM | POA: Diagnosis not present

## 2016-02-28 DIAGNOSIS — D62 Acute posthemorrhagic anemia: Secondary | ICD-10-CM | POA: Diagnosis not present

## 2016-02-28 DIAGNOSIS — Z950 Presence of cardiac pacemaker: Secondary | ICD-10-CM | POA: Diagnosis not present

## 2016-02-28 DIAGNOSIS — I5032 Chronic diastolic (congestive) heart failure: Secondary | ICD-10-CM | POA: Diagnosis not present

## 2016-02-28 DIAGNOSIS — S322XXD Fracture of coccyx, subsequent encounter for fracture with routine healing: Secondary | ICD-10-CM | POA: Diagnosis not present

## 2016-02-28 DIAGNOSIS — I2510 Atherosclerotic heart disease of native coronary artery without angina pectoris: Secondary | ICD-10-CM | POA: Diagnosis not present

## 2016-02-28 DIAGNOSIS — I110 Hypertensive heart disease with heart failure: Secondary | ICD-10-CM | POA: Diagnosis not present

## 2016-02-28 DIAGNOSIS — S3983XD Other specified injuries of pelvis, subsequent encounter: Secondary | ICD-10-CM | POA: Diagnosis not present

## 2016-02-28 DIAGNOSIS — I4891 Unspecified atrial fibrillation: Secondary | ICD-10-CM | POA: Diagnosis not present

## 2016-02-28 DIAGNOSIS — E1140 Type 2 diabetes mellitus with diabetic neuropathy, unspecified: Secondary | ICD-10-CM | POA: Diagnosis not present

## 2016-02-28 DIAGNOSIS — E785 Hyperlipidemia, unspecified: Secondary | ICD-10-CM | POA: Diagnosis not present

## 2016-02-28 DIAGNOSIS — M1991 Primary osteoarthritis, unspecified site: Secondary | ICD-10-CM | POA: Diagnosis not present

## 2016-03-04 DIAGNOSIS — E1140 Type 2 diabetes mellitus with diabetic neuropathy, unspecified: Secondary | ICD-10-CM | POA: Diagnosis not present

## 2016-03-04 DIAGNOSIS — Z950 Presence of cardiac pacemaker: Secondary | ICD-10-CM | POA: Diagnosis not present

## 2016-03-04 DIAGNOSIS — E785 Hyperlipidemia, unspecified: Secondary | ICD-10-CM | POA: Diagnosis not present

## 2016-03-04 DIAGNOSIS — S3983XD Other specified injuries of pelvis, subsequent encounter: Secondary | ICD-10-CM | POA: Diagnosis not present

## 2016-03-04 DIAGNOSIS — M1991 Primary osteoarthritis, unspecified site: Secondary | ICD-10-CM | POA: Diagnosis not present

## 2016-03-04 DIAGNOSIS — I5032 Chronic diastolic (congestive) heart failure: Secondary | ICD-10-CM | POA: Diagnosis not present

## 2016-03-04 DIAGNOSIS — D62 Acute posthemorrhagic anemia: Secondary | ICD-10-CM | POA: Diagnosis not present

## 2016-03-04 DIAGNOSIS — I4891 Unspecified atrial fibrillation: Secondary | ICD-10-CM | POA: Diagnosis not present

## 2016-03-04 DIAGNOSIS — I110 Hypertensive heart disease with heart failure: Secondary | ICD-10-CM | POA: Diagnosis not present

## 2016-03-04 DIAGNOSIS — S322XXD Fracture of coccyx, subsequent encounter for fracture with routine healing: Secondary | ICD-10-CM | POA: Diagnosis not present

## 2016-03-04 DIAGNOSIS — I2510 Atherosclerotic heart disease of native coronary artery without angina pectoris: Secondary | ICD-10-CM | POA: Diagnosis not present

## 2016-03-12 DIAGNOSIS — I482 Chronic atrial fibrillation: Secondary | ICD-10-CM | POA: Diagnosis not present

## 2016-03-17 ENCOUNTER — Encounter: Payer: Self-pay | Admitting: Internal Medicine

## 2016-04-08 ENCOUNTER — Encounter: Payer: Self-pay | Admitting: Internal Medicine

## 2016-04-15 ENCOUNTER — Encounter: Payer: PPO | Admitting: Internal Medicine

## 2016-04-29 ENCOUNTER — Inpatient Hospital Stay (HOSPITAL_COMMUNITY): Payer: PPO | Admitting: Certified Registered Nurse Anesthetist

## 2016-04-29 ENCOUNTER — Encounter (HOSPITAL_COMMUNITY): Payer: Self-pay | Admitting: Emergency Medicine

## 2016-04-29 ENCOUNTER — Emergency Department (HOSPITAL_COMMUNITY): Payer: PPO

## 2016-04-29 ENCOUNTER — Encounter (HOSPITAL_COMMUNITY)
Admission: EM | Disposition: A | Discharge status: Skilled Nursing Facility | Payer: Self-pay | Source: Home / Self Care | Attending: Internal Medicine

## 2016-04-29 ENCOUNTER — Inpatient Hospital Stay (HOSPITAL_COMMUNITY)
Admission: EM | Admit: 2016-04-29 | Discharge: 2016-05-01 | DRG: 510 | Disposition: A | Discharge status: Skilled Nursing Facility | Payer: PPO | Attending: Internal Medicine | Admitting: Internal Medicine

## 2016-04-29 DIAGNOSIS — S6992XA Unspecified injury of left wrist, hand and finger(s), initial encounter: Secondary | ICD-10-CM | POA: Diagnosis not present

## 2016-04-29 DIAGNOSIS — W19XXXD Unspecified fall, subsequent encounter: Secondary | ICD-10-CM | POA: Diagnosis not present

## 2016-04-29 DIAGNOSIS — S62101A Fracture of unspecified carpal bone, right wrist, initial encounter for closed fracture: Secondary | ICD-10-CM

## 2016-04-29 DIAGNOSIS — I10 Essential (primary) hypertension: Secondary | ICD-10-CM | POA: Diagnosis present

## 2016-04-29 DIAGNOSIS — Z950 Presence of cardiac pacemaker: Secondary | ICD-10-CM

## 2016-04-29 DIAGNOSIS — Z7984 Long term (current) use of oral hypoglycemic drugs: Secondary | ICD-10-CM | POA: Diagnosis not present

## 2016-04-29 DIAGNOSIS — M25531 Pain in right wrist: Secondary | ICD-10-CM | POA: Diagnosis not present

## 2016-04-29 DIAGNOSIS — Z885 Allergy status to narcotic agent status: Secondary | ICD-10-CM

## 2016-04-29 DIAGNOSIS — E118 Type 2 diabetes mellitus with unspecified complications: Secondary | ICD-10-CM | POA: Diagnosis not present

## 2016-04-29 DIAGNOSIS — S62101D Fracture of unspecified carpal bone, right wrist, subsequent encounter for fracture with routine healing: Secondary | ICD-10-CM | POA: Diagnosis not present

## 2016-04-29 DIAGNOSIS — S52511D Displaced fracture of right radial styloid process, subsequent encounter for closed fracture with routine healing: Secondary | ICD-10-CM | POA: Diagnosis not present

## 2016-04-29 DIAGNOSIS — Z9049 Acquired absence of other specified parts of digestive tract: Secondary | ICD-10-CM | POA: Diagnosis not present

## 2016-04-29 DIAGNOSIS — Z961 Presence of intraocular lens: Secondary | ICD-10-CM | POA: Diagnosis not present

## 2016-04-29 DIAGNOSIS — Z9841 Cataract extraction status, right eye: Secondary | ICD-10-CM

## 2016-04-29 DIAGNOSIS — M1990 Unspecified osteoarthritis, unspecified site: Secondary | ICD-10-CM | POA: Diagnosis not present

## 2016-04-29 DIAGNOSIS — D72829 Elevated white blood cell count, unspecified: Secondary | ICD-10-CM | POA: Diagnosis not present

## 2016-04-29 DIAGNOSIS — R609 Edema, unspecified: Secondary | ICD-10-CM

## 2016-04-29 DIAGNOSIS — E1100 Type 2 diabetes mellitus with hyperosmolarity without nonketotic hyperglycemic-hyperosmolar coma (NKHHC): Secondary | ICD-10-CM | POA: Diagnosis not present

## 2016-04-29 DIAGNOSIS — I4891 Unspecified atrial fibrillation: Secondary | ICD-10-CM | POA: Diagnosis present

## 2016-04-29 DIAGNOSIS — Z9842 Cataract extraction status, left eye: Secondary | ICD-10-CM | POA: Diagnosis not present

## 2016-04-29 DIAGNOSIS — R5381 Other malaise: Secondary | ICD-10-CM | POA: Diagnosis not present

## 2016-04-29 DIAGNOSIS — W010XXA Fall on same level from slipping, tripping and stumbling without subsequent striking against object, initial encounter: Secondary | ICD-10-CM | POA: Diagnosis not present

## 2016-04-29 DIAGNOSIS — Z90710 Acquired absence of both cervix and uterus: Secondary | ICD-10-CM | POA: Diagnosis not present

## 2016-04-29 DIAGNOSIS — Z833 Family history of diabetes mellitus: Secondary | ICD-10-CM | POA: Diagnosis not present

## 2016-04-29 DIAGNOSIS — R278 Other lack of coordination: Secondary | ICD-10-CM | POA: Diagnosis not present

## 2016-04-29 DIAGNOSIS — F0390 Unspecified dementia without behavioral disturbance: Secondary | ICD-10-CM | POA: Diagnosis present

## 2016-04-29 DIAGNOSIS — F329 Major depressive disorder, single episode, unspecified: Secondary | ICD-10-CM | POA: Diagnosis present

## 2016-04-29 DIAGNOSIS — Z811 Family history of alcohol abuse and dependence: Secondary | ICD-10-CM

## 2016-04-29 DIAGNOSIS — Z880 Allergy status to penicillin: Secondary | ICD-10-CM | POA: Diagnosis not present

## 2016-04-29 DIAGNOSIS — Z8249 Family history of ischemic heart disease and other diseases of the circulatory system: Secondary | ICD-10-CM

## 2016-04-29 DIAGNOSIS — R296 Repeated falls: Secondary | ICD-10-CM

## 2016-04-29 DIAGNOSIS — I480 Paroxysmal atrial fibrillation: Secondary | ICD-10-CM | POA: Diagnosis not present

## 2016-04-29 DIAGNOSIS — Z9181 History of falling: Secondary | ICD-10-CM | POA: Diagnosis not present

## 2016-04-29 DIAGNOSIS — Z8701 Personal history of pneumonia (recurrent): Secondary | ICD-10-CM

## 2016-04-29 DIAGNOSIS — Z79899 Other long term (current) drug therapy: Secondary | ICD-10-CM

## 2016-04-29 DIAGNOSIS — I2510 Atherosclerotic heart disease of native coronary artery without angina pectoris: Secondary | ICD-10-CM | POA: Diagnosis present

## 2016-04-29 DIAGNOSIS — S52501A Unspecified fracture of the lower end of right radius, initial encounter for closed fracture: Principal | ICD-10-CM | POA: Diagnosis present

## 2016-04-29 DIAGNOSIS — R41841 Cognitive communication deficit: Secondary | ICD-10-CM | POA: Diagnosis not present

## 2016-04-29 DIAGNOSIS — Z803 Family history of malignant neoplasm of breast: Secondary | ICD-10-CM | POA: Diagnosis not present

## 2016-04-29 DIAGNOSIS — Z87442 Personal history of urinary calculi: Secondary | ICD-10-CM

## 2016-04-29 DIAGNOSIS — R269 Unspecified abnormalities of gait and mobility: Secondary | ICD-10-CM | POA: Diagnosis not present

## 2016-04-29 DIAGNOSIS — I5032 Chronic diastolic (congestive) heart failure: Secondary | ICD-10-CM | POA: Diagnosis present

## 2016-04-29 DIAGNOSIS — S52591A Other fractures of lower end of right radius, initial encounter for closed fracture: Secondary | ICD-10-CM | POA: Diagnosis not present

## 2016-04-29 DIAGNOSIS — Z7901 Long term (current) use of anticoagulants: Secondary | ICD-10-CM | POA: Diagnosis not present

## 2016-04-29 DIAGNOSIS — M25532 Pain in left wrist: Secondary | ICD-10-CM | POA: Diagnosis not present

## 2016-04-29 DIAGNOSIS — M6281 Muscle weakness (generalized): Secondary | ICD-10-CM | POA: Diagnosis not present

## 2016-04-29 DIAGNOSIS — E785 Hyperlipidemia, unspecified: Secondary | ICD-10-CM | POA: Diagnosis not present

## 2016-04-29 DIAGNOSIS — W19XXXA Unspecified fall, initial encounter: Secondary | ICD-10-CM | POA: Diagnosis not present

## 2016-04-29 DIAGNOSIS — R262 Difficulty in walking, not elsewhere classified: Secondary | ICD-10-CM | POA: Diagnosis not present

## 2016-04-29 DIAGNOSIS — I110 Hypertensive heart disease with heart failure: Secondary | ICD-10-CM | POA: Diagnosis not present

## 2016-04-29 DIAGNOSIS — E1143 Type 2 diabetes mellitus with diabetic autonomic (poly)neuropathy: Secondary | ICD-10-CM | POA: Diagnosis not present

## 2016-04-29 HISTORY — PX: OPEN REDUCTION INTERNAL FIXATION (ORIF) DISTAL RADIAL FRACTURE: SHX5989

## 2016-04-29 HISTORY — PX: ORIF DISTAL RADIUS FRACTURE: SUR927

## 2016-04-29 LAB — CBC WITH DIFFERENTIAL/PLATELET
BASOS ABS: 0 10*3/uL (ref 0.0–0.1)
BASOS PCT: 0 %
EOS PCT: 0 %
Eosinophils Absolute: 0 10*3/uL (ref 0.0–0.7)
HCT: 43.5 % (ref 36.0–46.0)
Hemoglobin: 14.5 g/dL (ref 12.0–15.0)
Lymphocytes Relative: 9 %
Lymphs Abs: 1.5 10*3/uL (ref 0.7–4.0)
MCH: 30.9 pg (ref 26.0–34.0)
MCHC: 33.3 g/dL (ref 30.0–36.0)
MCV: 92.6 fL (ref 78.0–100.0)
MONO ABS: 0.5 10*3/uL (ref 0.1–1.0)
Monocytes Relative: 3 %
Neutro Abs: 14.2 10*3/uL — ABNORMAL HIGH (ref 1.7–7.7)
Neutrophils Relative %: 88 %
PLATELETS: 257 10*3/uL (ref 150–400)
RBC: 4.7 MIL/uL (ref 3.87–5.11)
RDW: 14.4 % (ref 11.5–15.5)
WBC: 16.3 10*3/uL — ABNORMAL HIGH (ref 4.0–10.5)

## 2016-04-29 LAB — BASIC METABOLIC PANEL
ANION GAP: 11 (ref 5–15)
BUN: 15 mg/dL (ref 6–20)
CALCIUM: 9.4 mg/dL (ref 8.9–10.3)
CO2: 26 mmol/L (ref 22–32)
Chloride: 102 mmol/L (ref 101–111)
Creatinine, Ser: 0.84 mg/dL (ref 0.44–1.00)
Glucose, Bld: 195 mg/dL — ABNORMAL HIGH (ref 65–99)
Potassium: 3.7 mmol/L (ref 3.5–5.1)
Sodium: 139 mmol/L (ref 135–145)

## 2016-04-29 LAB — PROTIME-INR
INR: 1.65
Prothrombin Time: 19.7 seconds — ABNORMAL HIGH (ref 11.4–15.2)

## 2016-04-29 LAB — GLUCOSE, CAPILLARY
Glucose-Capillary: 166 mg/dL — ABNORMAL HIGH (ref 65–99)
Glucose-Capillary: 222 mg/dL — ABNORMAL HIGH (ref 65–99)

## 2016-04-29 LAB — SURGICAL PCR SCREEN
MRSA, PCR: NEGATIVE
Staphylococcus aureus: POSITIVE — AB

## 2016-04-29 SURGERY — OPEN REDUCTION INTERNAL FIXATION (ORIF) DISTAL RADIUS FRACTURE
Anesthesia: Monitor Anesthesia Care | Laterality: Right

## 2016-04-29 MED ORDER — PROPOFOL 1000 MG/100ML IV EMUL
INTRAVENOUS | Status: AC
  Filled 2016-04-29: qty 100

## 2016-04-29 MED ORDER — ACETAMINOPHEN 650 MG RE SUPP: 650.0000 mg | Freq: Four times a day (QID) | RECTAL | Status: DC | PRN

## 2016-04-29 MED ORDER — HYDRALAZINE HCL 20 MG/ML IJ SOLN: 10.0000 mg | Freq: Once | INTRAMUSCULAR | Status: DC

## 2016-04-29 MED ORDER — HEPARIN (PORCINE) IN NACL 100-0.45 UNIT/ML-% IJ SOLN
1100.0000 [IU]/h | INTRAMUSCULAR | Status: DC
  Administered 2016-04-29: 1100 [IU]/h via INTRAVENOUS
  Filled 2016-04-29: qty 250

## 2016-04-29 MED ORDER — FENTANYL CITRATE (PF) 250 MCG/5ML IJ SOLN
INTRAMUSCULAR | Status: AC
  Filled 2016-04-29: qty 5

## 2016-04-29 MED ORDER — PROPOFOL 10 MG/ML IV BOLUS
INTRAVENOUS | Status: AC
  Filled 2016-04-29: qty 20

## 2016-04-29 MED ORDER — SODIUM CHLORIDE 0.9 % IV SOLN
INTRAVENOUS | Status: DC | PRN
  Administered 2016-04-29: 1000 mg via INTRAVENOUS

## 2016-04-29 MED ORDER — ESCITALOPRAM OXALATE 20 MG PO TABS
20.0000 mg | ORAL_TABLET | Freq: Every day | ORAL | Status: DC
  Administered 2016-04-30 (×2): 20 mg via ORAL
  Filled 2016-04-29 (×2): qty 1

## 2016-04-29 MED ORDER — DEXTROSE 50 % IV SOLN
INTRAVENOUS | Status: DC | PRN
  Administered 2016-04-29: 25 mL via INTRAVENOUS

## 2016-04-29 MED ORDER — LIDOCAINE-EPINEPHRINE (PF) 1.5 %-1:200000 IJ SOLN
INTRAMUSCULAR | Status: DC | PRN
  Administered 2016-04-29: 10 mL via PERINEURAL

## 2016-04-29 MED ORDER — HYDRALAZINE HCL 20 MG/ML IJ SOLN
10.0000 mg | Freq: Once | INTRAMUSCULAR | Status: AC
  Administered 2016-04-29: 10 mg via INTRAVENOUS
  Filled 2016-04-29: qty 1

## 2016-04-29 MED ORDER — ACETAMINOPHEN 325 MG PO TABS
650.0000 mg | ORAL_TABLET | Freq: Four times a day (QID) | ORAL | Status: DC | PRN
  Administered 2016-04-30: 650 mg via ORAL
  Filled 2016-04-29: qty 2

## 2016-04-29 MED ORDER — CEFAZOLIN SODIUM-DEXTROSE 2-4 GM/100ML-% IV SOLN: 2.0000 g | INTRAVENOUS | Status: DC

## 2016-04-29 MED ORDER — 0.9 % SODIUM CHLORIDE (POUR BTL) OPTIME
TOPICAL | Status: DC | PRN
  Administered 2016-04-29: 1000 mL

## 2016-04-29 MED ORDER — LOSARTAN POTASSIUM 50 MG PO TABS
100.0000 mg | ORAL_TABLET | Freq: Every day | ORAL | Status: DC
  Administered 2016-04-29 – 2016-05-01 (×3): 100 mg via ORAL
  Filled 2016-04-29 (×3): qty 2

## 2016-04-29 MED ORDER — HYDRALAZINE HCL 20 MG/ML IJ SOLN
INTRAMUSCULAR | Status: DC | PRN
  Administered 2016-04-29: 10 mg via INTRAVENOUS

## 2016-04-29 MED ORDER — INSULIN ASPART 100 UNIT/ML ~~LOC~~ SOLN
0.0000 [IU] | Freq: Three times a day (TID) | SUBCUTANEOUS | Status: DC
  Administered 2016-04-29: 2 [IU] via SUBCUTANEOUS
  Administered 2016-04-30: 1 [IU] via SUBCUTANEOUS
  Administered 2016-04-30 – 2016-05-01 (×4): 2 [IU] via SUBCUTANEOUS

## 2016-04-29 MED ORDER — LABETALOL HCL 5 MG/ML IV SOLN
INTRAVENOUS | Status: DC | PRN
  Administered 2016-04-29 (×3): 20 mg via INTRAVENOUS

## 2016-04-29 MED ORDER — CEFAZOLIN SODIUM-DEXTROSE 2-4 GM/100ML-% IV SOLN
INTRAVENOUS | Status: AC
  Filled 2016-04-29: qty 100

## 2016-04-29 MED ORDER — ONDANSETRON 4 MG PO TBDP: 8.0000 mg | ORAL_TABLET | Freq: Three times a day (TID) | ORAL | Status: DC | PRN

## 2016-04-29 MED ORDER — HYDROCODONE-ACETAMINOPHEN 5-325 MG PO TABS
1.0000 | ORAL_TABLET | ORAL | Status: DC | PRN
  Administered 2016-04-29: 1 via ORAL
  Filled 2016-04-29: qty 1

## 2016-04-29 MED ORDER — ONDANSETRON HCL 4 MG/2ML IJ SOLN: 4.0000 mg | Freq: Four times a day (QID) | INTRAMUSCULAR | Status: DC | PRN

## 2016-04-29 MED ORDER — CLONAZEPAM 0.5 MG PO TABS
0.2500 mg | ORAL_TABLET | Freq: Every day | ORAL | Status: DC
  Administered 2016-04-30 (×2): 0.25 mg via ORAL
  Filled 2016-04-29 (×2): qty 1

## 2016-04-29 MED ORDER — METOPROLOL TARTRATE 25 MG PO TABS
25.0000 mg | ORAL_TABLET | Freq: Two times a day (BID) | ORAL | Status: DC
Start: 2016-04-29 — End: 2016-05-01
  Administered 2016-04-29 – 2016-05-01 (×5): 25 mg via ORAL
  Filled 2016-04-29 (×5): qty 1

## 2016-04-29 MED ORDER — POLYETHYLENE GLYCOL 3350 17 G PO PACK
17.0000 g | PACK | Freq: Every day | ORAL | Status: DC | PRN
  Administered 2016-04-30: 17 g via ORAL
  Filled 2016-04-29: qty 1

## 2016-04-29 MED ORDER — BUPIVACAINE-EPINEPHRINE (PF) 0.5% -1:200000 IJ SOLN
INTRAMUSCULAR | Status: DC | PRN
  Administered 2016-04-29: 30 mL via PERINEURAL

## 2016-04-29 MED ORDER — PROPOFOL 500 MG/50ML IV EMUL
INTRAVENOUS | Status: DC | PRN
  Administered 2016-04-29: 100 ug/kg/min via INTRAVENOUS

## 2016-04-29 MED ORDER — FENTANYL CITRATE (PF) 250 MCG/5ML IJ SOLN
INTRAMUSCULAR | Status: DC | PRN
  Administered 2016-04-29 (×2): 50 ug via INTRAVENOUS

## 2016-04-29 MED ORDER — VITAMIN C 500 MG PO TABS
1000.0000 mg | ORAL_TABLET | Freq: Every day | ORAL | Status: DC
  Administered 2016-04-30 – 2016-05-01 (×2): 1000 mg via ORAL
  Filled 2016-04-29 (×2): qty 2

## 2016-04-29 MED ORDER — SODIUM CHLORIDE 0.9% FLUSH
3.0000 mL | Freq: Two times a day (BID) | INTRAVENOUS | Status: DC
  Administered 2016-04-30: 3 mL via INTRAVENOUS

## 2016-04-29 MED ORDER — VANCOMYCIN HCL IN DEXTROSE 1-5 GM/200ML-% IV SOLN
INTRAVENOUS | Status: AC
  Filled 2016-04-29: qty 200

## 2016-04-29 MED ORDER — TRAZODONE HCL 50 MG PO TABS: 50.0000 mg | ORAL_TABLET | Freq: Every evening | ORAL | Status: DC | PRN

## 2016-04-29 MED ORDER — CHLORHEXIDINE GLUCONATE 4 % EX LIQD
60.0000 mL | Freq: Once | CUTANEOUS | Status: DC
  Filled 2016-04-29: qty 60

## 2016-04-29 MED ORDER — METOPROLOL TARTRATE 5 MG/5ML IV SOLN
INTRAVENOUS | Status: DC | PRN
  Administered 2016-04-29: 2 mg via INTRAVENOUS

## 2016-04-29 MED ORDER — DILTIAZEM HCL ER COATED BEADS 240 MG PO CP24
240.0000 mg | ORAL_CAPSULE | Freq: Every day | ORAL | Status: DC
Start: 2016-04-29 — End: 2016-05-01
  Administered 2016-04-29 – 2016-05-01 (×3): 240 mg via ORAL
  Filled 2016-04-29 (×3): qty 1

## 2016-04-29 MED ORDER — SODIUM CHLORIDE 0.9 % IV SOLN
INTRAVENOUS | Status: DC
  Administered 2016-04-29 – 2016-04-30 (×4): via INTRAVENOUS

## 2016-04-29 MED ORDER — OXYCODONE-ACETAMINOPHEN 5-325 MG PO TABS
1.0000 | ORAL_TABLET | Freq: Once | ORAL | Status: AC
  Administered 2016-04-29: 1 via ORAL
  Filled 2016-04-29: qty 1

## 2016-04-29 SURGICAL SUPPLY — 56 items
BANDAGE ACE 4X5 VEL STRL LF (GAUZE/BANDAGES/DRESSINGS) ×3 IMPLANT
BANDAGE ELASTIC 3 VELCRO ST LF (GAUZE/BANDAGES/DRESSINGS) ×3 IMPLANT
BIT DRILL 2.2 SS TIBIAL (BIT) ×2 IMPLANT
BLADE CLIPPER SURG (BLADE) IMPLANT
BNDG CMPR 9X4 STRL LF SNTH (GAUZE/BANDAGES/DRESSINGS) ×1
BNDG ESMARK 4X9 LF (GAUZE/BANDAGES/DRESSINGS) ×3 IMPLANT
BNDG GAUZE ELAST 4 BULKY (GAUZE/BANDAGES/DRESSINGS) ×9 IMPLANT
CORDS BIPOLAR (ELECTRODE) ×3 IMPLANT
COVER SURGICAL LIGHT HANDLE (MISCELLANEOUS) ×3 IMPLANT
CUFF TOURNIQUET SINGLE 18IN (TOURNIQUET CUFF) ×3 IMPLANT
CUFF TOURNIQUET SINGLE 24IN (TOURNIQUET CUFF) IMPLANT
DECANTER SPIKE VIAL GLASS SM (MISCELLANEOUS) IMPLANT
DRAIN TLS ROUND 10FR (DRAIN) IMPLANT
DRAPE OEC MINIVIEW 54X84 (DRAPES) IMPLANT
DRAPE U-SHAPE 47X51 STRL (DRAPES) ×3 IMPLANT
DRSG ADAPTIC 3X8 NADH LF (GAUZE/BANDAGES/DRESSINGS) ×3 IMPLANT
GAUZE SPONGE 4X4 12PLY STRL (GAUZE/BANDAGES/DRESSINGS) ×3 IMPLANT
GAUZE XEROFORM 5X9 LF (GAUZE/BANDAGES/DRESSINGS) ×3 IMPLANT
GLOVE BIOGEL M 8.0 STRL (GLOVE) ×3 IMPLANT
GLOVE SS BIOGEL STRL SZ 8 (GLOVE) ×1 IMPLANT
GLOVE SUPERSENSE BIOGEL SZ 8 (GLOVE) ×2
GOWN STRL REUS W/ TWL LRG LVL3 (GOWN DISPOSABLE) ×3 IMPLANT
GOWN STRL REUS W/ TWL XL LVL3 (GOWN DISPOSABLE) ×3 IMPLANT
GOWN STRL REUS W/TWL LRG LVL3 (GOWN DISPOSABLE) ×9
GOWN STRL REUS W/TWL XL LVL3 (GOWN DISPOSABLE) ×9
KIT BASIN OR (CUSTOM PROCEDURE TRAY) ×3 IMPLANT
KIT ROOM TURNOVER OR (KITS) ×3 IMPLANT
MANIFOLD NEPTUNE II (INSTRUMENTS) ×3 IMPLANT
NEEDLE 22X1 1/2 (OR ONLY) (NEEDLE) IMPLANT
NS IRRIG 1000ML POUR BTL (IV SOLUTION) ×3 IMPLANT
PACK ORTHO EXTREMITY (CUSTOM PROCEDURE TRAY) ×3 IMPLANT
PAD ARMBOARD 7.5X6 YLW CONV (MISCELLANEOUS) ×6 IMPLANT
PAD CAST 4YDX4 CTTN HI CHSV (CAST SUPPLIES) ×1 IMPLANT
PADDING CAST COTTON 4X4 STRL (CAST SUPPLIES) ×3
PEG LOCKING SMOOTH 2.2X16 (Screw) ×2 IMPLANT
PEG LOCKING SMOOTH 2.2X18 (Peg) ×6 IMPLANT
PEG LOCKING SMOOTH 2.2X20 (Screw) ×2 IMPLANT
PEG LOCKING SMOOTH 2.2X22 (Screw) ×2 IMPLANT
PLATE MEDIUM DVR RIGHT (Plate) ×2 IMPLANT
SCREW LOCK 16X2.7X 3 LD TPR (Screw) IMPLANT
SCREW LOCKING 2.7X15MM (Screw) ×6 IMPLANT
SCREW LOCKING 2.7X16 (Screw) ×12 IMPLANT
SCRUB BETADINE 4OZ XXX (MISCELLANEOUS) ×3 IMPLANT
SOLUTION BETADINE 4OZ (MISCELLANEOUS) ×3 IMPLANT
SUT MNCRL AB 4-0 PS2 18 (SUTURE) ×3 IMPLANT
SUT PROLENE 3 0 PS 2 (SUTURE) IMPLANT
SUT VIC AB 3-0 FS2 27 (SUTURE) IMPLANT
SYR CONTROL 10ML LL (SYRINGE) IMPLANT
SYSTEM CHEST DRAIN TLS 7FR (DRAIN) ×2 IMPLANT
TOWEL OR 17X24 6PK STRL BLUE (TOWEL DISPOSABLE) ×3 IMPLANT
TOWEL OR 17X26 10 PK STRL BLUE (TOWEL DISPOSABLE) ×3 IMPLANT
TUBE CONNECTING 12'X1/4 (SUCTIONS) ×1
TUBE CONNECTING 12X1/4 (SUCTIONS) ×2 IMPLANT
TUBE EVACUATION TLS (MISCELLANEOUS) ×3 IMPLANT
UNDERPAD 30X30 (UNDERPADS AND DIAPERS) ×3 IMPLANT
WATER STERILE IRR 1000ML POUR (IV SOLUTION) ×3 IMPLANT

## 2016-04-29 NOTE — ED Notes (Signed)
Ortho tech paged  

## 2016-04-29 NOTE — Progress Notes (Signed)
Patient ID: Arman Filter, female   DOB: Jan 12, 1935, 82 y.o.   MRN: [id number] Discussed with medicine on the phone. Also discussed all issues with the family postoperatively.  The patient is doing relatively well.  We will remove her drain late tomorrow. I would wait until after 8 PM tomorrow to resume any anticoagulants.  Going forward we will see how she is looking in terms of her medical status prior to DC to the home setting.  All questions have been addressed.  Cyriah Childrey M.D.

## 2016-04-29 NOTE — Anesthesia Procedure Notes (Addendum)
Anesthesia Regional Block: Axillary brachial plexus block   Pre-Anesthetic Checklist: ,, timeout performed, Correct Patient, Correct Site, Correct Laterality, Correct Procedure, Correct Position, site marked, Risks and benefits discussed,  Surgical consent,  Pre-op evaluation,  At surgeon's request and post-op pain management  Laterality: Upper and Right  Prep: chloraprep       Needles:  Injection technique: Single-shot  Needle Type: Echogenic Needle          Additional Needles:   Procedures: ultrasound guided,,,,,,,,  Narrative:  Start time: 04/29/2016 6:58 PM End time: 04/29/2016 7:04 PM Injection made incrementally with aspirations every 5 mL.  Performed by: Personally   Additional Notes: H+P and labs reviewed, risks and benefits discussed with patient, procedure tolerated well without complications

## 2016-04-29 NOTE — Progress Notes (Signed)
Orthopedic Tech Progress Note Patient Details:  Traci [name] 1935/01/27 [id number]  Ortho Devices Type of Ortho Device: Ace wrap, Post (short arm) splint Ortho Device/Splint Interventions: Application   [place] Pink 04/29/2016, 10:41 AM

## 2016-04-29 NOTE — Plan of Care (Signed)
Dr. Amedeo Plenty paged this NP to discuss postop anticoagulation. Pt just out of surgery for wrist fx repair. Pt has hx of AF on Coumadin at home. Heparin drip was started by admit team until 1.5 hrs before surgical time. Pt will need AC, but surgeon request 24 hours without it (which will be up on 4/4 at 8pm). For now, we will use SCDs. Pt will need Heparin to Coumadin bridge before discharge as preop INR was 1.6.  KJKG, NP [facility]

## 2016-04-29 NOTE — Progress Notes (Signed)
ANTICOAGULATION CONSULT NOTE - Initial Consult  Pharmacy Consult for Heparin Indication: atrial fibrillation  Allergies  Allergen Reactions  . Morphine Nausea Only  . Penicillins Other (See Comments)    Has patient had a PCN reaction causing immediate rash, facial/tongue/throat swelling, SOB or lightheadedness with hypotension: NO Has patient had a PCN reaction causing severe rash involving mucus membranes or skin necrosis: no Has patient had a PCN reaction that required hospitalization: NO Has patient had a PCN reaction occurring within the last 10 years: NO If all of the above answers are "NO", then may proceed with Cephalosporin use.     Patient Measurements: Weight 66.2kg Height 5'5'' IBW: 57.7kg  Vital Signs: Temp: 97.9 F (36.6 C) (04/03 0708) Temp Source: Oral (04/03 0708) BP: 172/117 (04/03 1430) Pulse Rate: 91 (04/03 1419)  Labs:  Recent Labs  04/29/16 1030  HGB 14.5  HCT 43.5  PLT 257  LABPROT 19.7*  INR 1.65  CREATININE 0.84    CrCl cannot be calculated (Unknown ideal weight.).   Medical History: Past Medical History:  Diagnosis Date  . Arthritis   . Atrial fibrillation ([place])   . Carotid artery disease ([place])    Right carotid endarectomy 1999  . Chronic anticoagulation    Followed by PMD  . Coronary atherosclerosis    a. Minor at cardiac catheterization 2005 b. cath 10/16/2014 40% prox LAD dx, otherwise minimal CAD  . Depression   . Essential hypertension   . Glucose intolerance (impaired glucose tolerance)   . History of kidney stones   . History of pneumonia    2010  . Hyperlipidemia   . Sick sinus syndrome ([facility])    [employer] PPM   Assessment: 81yof on coumadin pta for afib, admitted s/p fall now with wrist fracture. INR 1.65. Holding coumadin and will start heparin bridge pending surgery. Previously therapeutic on 1100 units/hr.   Goal of Therapy:  Heparin level 0.3-0.7 units/ml Monitor platelets by anticoagulation protocol: Yes    Plan:  1) Start heparin at 1100 units/hr, no bolus 2) 8 hour heparin level vs follow up after OR  Deboraha Sprang 04/29/2016,2:56 PM

## 2016-04-29 NOTE — Anesthesia Preprocedure Evaluation (Signed)
Anesthesia Evaluation  Patient identified by MRN, date of birth, ID band Patient awake    Reviewed: Allergy & Precautions, NPO status , Patient's Chart, lab work & pertinent test results, reviewed documented beta blocker date and time   History of Anesthesia Complications Negative for: history of anesthetic complications  Airway Mallampati: II  TM Distance: >3 FB Neck ROM: Full    Dental  (+) Teeth Intact   Pulmonary neg pulmonary ROS,    breath sounds clear to auscultation       Cardiovascular hypertension, Pt. on medications and Pt. on home beta blockers + CAD and + Peripheral Vascular Disease  + dysrhythmias Atrial Fibrillation + pacemaker  Rhythm:Irregular Rate:Tachycardia     Neuro/Psych PSYCHIATRIC DISORDERS Depression Multiple falls in last 3 months, no known cause  Neuromuscular disease    GI/Hepatic negative GI ROS, Neg liver ROS,   Endo/Other  diabetes  Renal/GU negative Renal ROS     Musculoskeletal  (+) Arthritis , Right wrist fx   Abdominal   Peds  Hematology  (+) anemia ,   Anesthesia Other Findings   Reproductive/Obstetrics                             Anesthesia Physical Anesthesia Plan  ASA: III  Anesthesia Plan: Regional and MAC   Post-op Pain Management:    Induction:   Airway Management Planned: Nasal Cannula, Simple Face Mask and Natural Airway  Additional Equipment: None  Intra-op Plan:   Post-operative Plan:   Informed Consent: I have reviewed the patients History and Physical, chart, labs and discussed the procedure including the risks, benefits and alternatives for the proposed anesthesia with the patient or authorized representative who has indicated his/her understanding and acceptance.   Dental advisory given  Plan Discussed with: CRNA and Surgeon  Anesthesia Plan Comments:         Anesthesia Quick Evaluation

## 2016-04-29 NOTE — ED Notes (Signed)
pts daughter in law Helene Kelp requested to be contacted when patient gets a room, [phone]

## 2016-04-29 NOTE — Progress Notes (Signed)
Report given to Judson Roch, CRNA

## 2016-04-29 NOTE — Anesthesia Postprocedure Evaluation (Addendum)
Anesthesia Post Note  Patient: Sharmon Cheramie Madaris  Procedure(s) Performed: Procedure(s) (LRB): OPEN REDUCTION INTERNAL FIXATION (ORIF) DISTAL RADIAL FRACTURE (Right)  Patient location during evaluation: PACU Anesthesia Type: Regional Level of consciousness: awake and alert Pain management: pain level controlled Vital Signs Assessment: post-procedure vital signs reviewed and stable Respiratory status: spontaneous breathing, nonlabored ventilation, respiratory function stable and patient connected to nasal cannula oxygen Cardiovascular status: stable and blood pressure returned to baseline Anesthetic complications: no       Last Vitals:  Vitals:   04/29/16 2115 04/29/16 2130  BP: 132/77 (!) 127/91  Pulse: 94 80  Resp: 15 18  Temp:  36.8 C    Last Pain:  Vitals:   04/29/16 2115  TempSrc:   PainSc: 0-No pain                 Trevor Wilkie

## 2016-04-29 NOTE — ED Notes (Signed)
ED Provider at bedside. 

## 2016-04-29 NOTE — ED Notes (Signed)
Dr Darl Householder made aware of patients elevated BP

## 2016-04-29 NOTE — Op Note (Signed)
NAME:  Traci [name], Traci [name] NO.:  [id number]  MEDICAL RECORD NO.:  [id number]  LOCATION:                               FACILITY:  [facility]  PHYSICIAN:  Satira Anis. Amanda Pote, M.D.DATE OF BIRTH:  03-22-1934  DATE OF PROCEDURE:04/29/2016 DATE OF DISCHARGE:  05/01/2016                              OPERATIVE REPORT   PREOPERATIVE DIAGNOSIS:  Comminuted complex intra-articular greater than 3-part distal radius fracture right upper extremity in an active 82 year old female.  POSTOPERATIVE DIAGNOSIS:  Comminuted complex intra-articular greater than 3-part distal radius fracture right upper extremity in an active 53- year-old female.  PROCEDURE: 1. Open reduction and internal fixation distal radius fracture,     comminuted complex greater than 3 part intra-articular with [employer]     DVR plate and screw construct. 2. Closed treatment ulnar styloid fracture. 3. Four view radiographic series.  SURGEON:  Roseanne Kaufman, M.D.  ASSISTANT:  None.  COMPLICATIONS:  None.  ANESTHESIA:  Block with IV sedation.  TOURNIQUET TIME:  Less than an hour.  DRAINS:  One.  INDICATIONS:  A pleasant female presents with the above-mentioned diagnosis.  I have counseled in regard to risks and benefits of surgery and she desires to proceed.  All questions have been encouraged and answered preoperatively.  OPERATIVE PROCEDURE:  The patient was seen by myself and Anesthesia, taken to the operative theater and underwent smooth induction of IV sedation.  A block was placed in the holding area.  Arm was prepped with Hibiclens pre-scrub followed by Betadine scrub and paint.  Once this was complete, time-out was observed.  Preoperative vancomycin was given due to a penicillin allergy noted on the chart.  The patient had an incision made under 250 mmHg tourniquet control, dissection was carried down. FCR tendon sheath was incised palmarly and dorsally.  Carpal canal contents retracted ulnarly.   Pronator was incised this was a very thin remnant pronator.  Following this, we then very carefully and cautiously piecemeal back the fracture.  I performed fracture reduction with temporary fixation followed by placement of a DVR plate and screw construct.  I was able to achieve adequate radial height, inclination, and volar tilt to my satisfaction without difficulty and I was quite pleased with this.  Following plate application, I irrigated copiously, closed the pronator as best we could with 3-0 Vicryl and closed the skin edge over drain with tourniquet deflated.  I did perform an extensive release of the fascia given the Coumadin issues.  Her bleeding was controlled nicely. Her INR was 1.6 approximately and heparin drip was stopped. We will watch her closely in the postop.  We will remove her drain once appropriate, continue hospitalization until she is ready for discharge. These notes have been discussed and all questions have been encouraged and answered.  Should any problems occur, I will be immediately available.     Satira Anis. Amedeo Plenty, M.D.     [facility]  D:  04/29/2016  T:  04/29/2016  Job:  [id number]

## 2016-04-29 NOTE — ED Notes (Signed)
Admitting provider at bedside advised patient may have ice chips at this time

## 2016-04-29 NOTE — Consult Note (Signed)
Reason for Consult:Right wrist fx Referring Physician: [name] is an 82 y.o. female.  HPI: Traci [name], RHD, was getting into bed this morning about 0500 when she slipped out of it and fell to the floor. She got up and then fell again onto the same arm. She was brought to the ED for evaluation by her daughter. X-rays showed a distal radius fx and orthopedic surgery was consulted. She has multiple medical problems including anticoagulation with coumadin for afib and has an implanted defibrillator.  Past Medical History:  Diagnosis Date  . Arthritis   . Atrial fibrillation ([place])   . Carotid artery disease ([place])    Right carotid endarectomy 1999  . Chronic anticoagulation    Followed by PMD  . Coronary atherosclerosis    a. Minor at cardiac catheterization 2005 b. cath 10/16/2014 40% prox LAD dx, otherwise minimal CAD  . Depression   . Essential hypertension   . Glucose intolerance (impaired glucose tolerance)   . History of kidney stones   . History of pneumonia    2010  . Hyperlipidemia   . Sick sinus syndrome [facility])    [employer] PPM    Past Surgical History:  Procedure Laterality Date  . ABDOMINAL HYSTERECTOMY    . APPENDECTOMY    . BREAST BIOPSY Bilateral    x 7 total  . CARDIAC CATHETERIZATION N/A 10/16/2014   Procedure: Left Heart Cath and Coronary Angiography;  Surgeon: Leonie Man, MD;  Location: [facility];  Service: Cardiovascular;  Laterality: N/A;  . Carotid endarectomy Right    1999  . COLONOSCOPY     2006  . CYSTOSCOPY W/ URETERAL STENT PLACEMENT Left 04/19/2012   Procedure: CYSTOSCOPY WITH RETROGRADE PYELOGRAM/URETERAL STENT PLACEMENT ;  Surgeon: Ailene Rud, MD;  Location: [facility];  Service: Urology;  Laterality: Left;  . CYSTOSCOPY WITH RETROGRADE PYELOGRAM, URETEROSCOPY AND STENT PLACEMENT Left 06/30/2012   Procedure: CYSTOSCOPY WITH LEFT  RETROGRADE PYELOGRAM, URETEROSCOPY  with basketing of stone, AND STENT PLACEMENT, TRANSURETHRAL  UNROOFING OF URETER.;  Surgeon: Alexis Frock, MD;  Location: [facility];  Service: Urology;  Laterality: Left;  . EYE SURGERY Bilateral    Cataract extraction with IOL  . PACEMAKER PLACEMENT     2006    Family History  Problem Relation Age of Onset  . Heart attack Mother     Died age 55  . Hyperlipidemia Mother   . Diabetes Mother   . Coronary artery disease Father   . Alcohol abuse Father   . Hyperlipidemia Father   . Diabetes Sister   . Hypertension Sister   . Breast cancer Sister   . Breast cancer Sister     Social History:  reports that she has never smoked. She has never used smokeless tobacco. She reports that she does not drink alcohol or use drugs.  Allergies:  Allergies  Allergen Reactions  . Morphine Nausea Only  . Penicillins Other (See Comments)    Has patient had a PCN reaction causing immediate rash, facial/tongue/throat swelling, SOB or lightheadedness with hypotension: NO Has patient had a PCN reaction causing severe rash involving mucus membranes or skin necrosis: no Has patient had a PCN reaction that required hospitalization: NO Has patient had a PCN reaction occurring within the last 10 years: NO If all of the above answers are "NO", then may proceed with Cephalosporin use.     Medications: I have reviewed the patient's current medications.  No results found for this  or any previous visit (from the past 48 hour(s)).  Dg Wrist Complete Right  Result Date: 04/29/2016 CLINICAL DATA:  Fall. EXAM: RIGHT WRIST - COMPLETE 3+ VIEW COMPARISON:  02/07/2016 . FINDINGS: Comminuted severely angulated fracture of the distal right radial metaphysis is noted. Diffuse degenerative change. Peripheral vascular calcification . IMPRESSION: 1. Comminuted severely angulated fracture of the distal radius noted. 2. Peripheral vascular disease . Electronically Signed   By: Marcello Moores  Register   On: 04/29/2016 07:35    Review of Systems  Constitutional: Negative for weight loss.   HENT: Negative for ear discharge, ear pain, hearing loss and tinnitus.   Eyes: Negative for blurred vision, double vision, photophobia and pain.  Respiratory: Negative for cough, sputum production and shortness of breath.   Cardiovascular: Negative for chest pain.  Gastrointestinal: Negative for abdominal pain, nausea and vomiting.  Genitourinary: Negative for dysuria, flank pain, frequency and urgency.  Musculoskeletal: Positive for joint pain (Right wrist). Negative for back pain, falls, myalgias and neck pain.  Neurological: Negative for dizziness, tingling, sensory change, focal weakness, loss of consciousness and headaches.  Endo/Heme/Allergies: Does not bruise/bleed easily.  Psychiatric/Behavioral: Negative for depression, memory loss and substance abuse. The patient is not nervous/anxious.    Blood pressure (!) 180/87, pulse 70, temperature 97.9 F (36.6 C), temperature source Oral, resp. rate 18, SpO2 91 %. Physical Exam  Constitutional: She appears well-developed and well-nourished. No distress.  HENT:  Head: Normocephalic and atraumatic.  Eyes: Conjunctivae are normal. Right eye exhibits no discharge. Left eye exhibits no discharge. No scleral icterus.  Neck: Normal range of motion. Neck supple.  Cardiovascular: Normal rate and normal heart sounds.  An irregularly irregular rhythm present.  Respiratory: Effort normal and breath sounds normal. No respiratory distress. She has no wheezes. She has no rales.  GI: Soft. Bowel sounds are normal.  Musculoskeletal:  Left shoulder, elbow, wrist, digits- no skin wounds, nontender, no instability, no blocks to motion  Sens  Ax/R/M/U intact  Mot   Ax/ R/ PIN/ M/ AIN/ U intact  Rad 2+  Right shoulder, elbow, digits- no skin wounds, nontender, no instability, no blocks to motion. Wrist swollen, TTP, deformed          Sens  Ax/M/U intact, R with some dysthesia in thumb and pointer finger  Mot   Ax/ R/ PIN/ M/ AIN/ U difficult to assess 2/2  pain  Rad 2+  BLE No traumatic wounds, ecchymosis, or rash  Nontender  No effusions  Knee stable to varus/ valgus and anterior/posterior stress  Sens DPN, SPN, TN intact  Motor EHL, ext, flex, evers 5/5  DP 2+, PT 2+, No significant edema  Lymphadenopathy:    She has no cervical adenopathy.  Neurological: She is alert.  Skin: Skin is warm and dry. She is not diaphoretic.  Psychiatric: She has a normal mood and affect. Her behavior is normal. Cognition and memory are impaired.    Assessment/Plan: Fall Right distal radius fx -- She is a relatively active woman, living by herself during the day and staying with her daughter at night. Will need ORIF by Dr. Amedeo Plenty once her INR allows. Awaiting value now. If it's high would consider dose of vitamin K and recheck this afternoon but may have to delay until tomorrow. Multiple medical problems -- Will ask EDP to get IM to admit, weigh in on advisability of vitamin K.    Lisette Abu, PA-C Orthopedic Surgery [phone] 04/29/2016, 10:17 AM

## 2016-04-29 NOTE — Op Note (Signed)
See dictation [id number] Status post ORIF right wrist fracture comminuted complex with DVR plate and screw construct Anberlyn Feimster M.D.

## 2016-04-29 NOTE — Op Note (Deleted)
  The note originally documented on this encounter has been moved the the encounter in which it belongs.  

## 2016-04-29 NOTE — Transfer of Care (Signed)
Immediate Anesthesia Transfer of Care Note  Patient: Traci [name]  Procedure(s) Performed: Procedure(s): OPEN REDUCTION INTERNAL FIXATION (ORIF) DISTAL RADIAL FRACTURE (Right)  Patient Location: PACU  Anesthesia Type:MAC combined with regional for post-op pain  Level of Consciousness: awake, alert  and confused  Airway & Oxygen Therapy: Patient connected to nasal cannula oxygen  Post-op Assessment: Report given to RN and Post -op Vital signs reviewed and stable  Post vital signs: Reviewed and stable  Last Vitals:  Vitals:   04/29/16 1706 04/29/16 2045  BP: (!) 187/119   Pulse: 75   Resp: 18   Temp: 36.3 C (P) 36.2 C    Last Pain:  Vitals:   04/29/16 1721  TempSrc:   PainSc: 2          Complications: No apparent anesthesia complications

## 2016-04-29 NOTE — H&P (Signed)
History and Physical    MARYON KEMNITZ GHW:[id number] DOB: 05/19/1934 DOA: 04/29/2016  PCP: [name], MD   Patient coming from: Home   Chief Complaint: Fall  HPI: Traci [name] is a 82 y.o. female with medical history significant of atrial fibrillation, on Coumadin anti-coagulation therapy, coronary artery disease, PAD with right carotid endarterectomy, hypertension, hyperlipidemia, diabetes mellitus, mild dementia who presented to the emergency department today after she sustained a fall earlier this morning landing on her right hand and sustaining a right wrist fracture. Patient did not report any dizziness or lightheadedness, does not report palpitations or loss of consciousness, on right-sided weakness. She said that she just all of a sudden slowly weaned down and fell on her right side.  Patient lives alone, but every night she sleeps over at her daughter's house because she has been prone to falling lately. She sustained 3 falls within the last 2 months. First fall was in January 2018 when she lacerated the vessel and fractured coccyx. During that admission she was confined to the ICU for 7 days. The family noted some gait instability and times patient has a look on her face like she is not present at the moment and stairs somewhere in the air with glossy eyes.   She sustained another fall yesterday on her daughter's porch landing over the plastic chair which she crashed, but fortunately didn't injure herself at that time.   ED Course: On arrival in the emergency room patient's vital signs were stable except accelerated blood pressure to 161/94 and increasing to 190/107.  Right wrist x-ray revealed comminuted severely angulated fracture of the distal radius. Blood work was unremarkable except elevated glucose 195, elevated white blood cells count-16,300,  ProTime related and 19.7, but INR was subtherapeutic 1.65  During my assessment patient had fluctuating heart rhythm between 60 to and 140  documented on telemetry  Review of Systems: As per HPI otherwise 10 point review of systems negative.   Ambulatory Status: Independent  Past Medical History:  Diagnosis Date  . Arthritis   . Atrial fibrillation ([place])   . Carotid artery disease ([place])    Right carotid endarectomy 1999  . Chronic anticoagulation    Followed by PMD  . Coronary atherosclerosis    a. Minor at cardiac catheterization 2005 b. cath 10/16/2014 40% prox LAD dx, otherwise minimal CAD  . Depression   . Essential hypertension   . Glucose intolerance (impaired glucose tolerance)   . History of kidney stones   . History of pneumonia    2010  . Hyperlipidemia   . Sick sinus syndrome [facility])    [employer] PPM    Past Surgical History:  Procedure Laterality Date  . ABDOMINAL HYSTERECTOMY    . APPENDECTOMY    . BREAST BIOPSY Bilateral    x 7 total  . CARDIAC CATHETERIZATION N/A 10/16/2014   Procedure: Left Heart Cath and Coronary Angiography;  Surgeon: Leonie Man, MD;  Location: [facility];  Service: Cardiovascular;  Laterality: N/A;  . Carotid endarectomy Right    1999  . COLONOSCOPY     2006  . CYSTOSCOPY W/ URETERAL STENT PLACEMENT Left 04/19/2012   Procedure: CYSTOSCOPY WITH RETROGRADE PYELOGRAM/URETERAL STENT PLACEMENT ;  Surgeon: Ailene Rud, MD;  [facility];  Service: Urology;  Laterality: Left;  . CYSTOSCOPY WITH RETROGRADE PYELOGRAM, URETEROSCOPY AND STENT PLACEMENT Left 06/30/2012   Procedure: CYSTOSCOPY WITH LEFT  RETROGRADE PYELOGRAM, URETEROSCOPY  with basketing of stone, AND STENT PLACEMENT,  TRANSURETHRAL UNROOFING OF URETER.;  Surgeon: Alexis Frock, MD;  Location: [facility];  Service: Urology;  Laterality: Left;  . EYE SURGERY Bilateral    Cataract extraction with IOL  . PACEMAKER PLACEMENT     2006    Social History   Social History  . Marital status: Married    Spouse name: N/A  . Number of children: N/A  . Years of education: N/A   Occupational History  . Not  on file.   Social History Main Topics  . Smoking status: Never Smoker  . Smokeless tobacco: Never Used  . Alcohol use No  . Drug use: No  . Sexual activity: No   Other Topics Concern  . Not on file   Social History Narrative  . No narrative on file    Allergies  Allergen Reactions  . Morphine Nausea Only  . Penicillins Other (See Comments)    Has patient had a PCN reaction causing immediate rash, facial/tongue/throat swelling, SOB or lightheadedness with hypotension: NO Has patient had a PCN reaction causing severe rash involving mucus membranes or skin necrosis: no Has patient had a PCN reaction that required hospitalization: NO Has patient had a PCN reaction occurring within the last 10 years: NO If all of the above answers are "NO", then may proceed with Cephalosporin use.     Family History  Problem Relation Age of Onset  . Heart attack Mother     Died age 80  . Hyperlipidemia Mother   . Diabetes Mother   . Coronary artery disease Father   . Alcohol abuse Father   . Hyperlipidemia Father   . Diabetes Sister   . Hypertension Sister   . Breast cancer Sister   . Breast cancer Sister     Prior to Admission medications   Medication Sig Start Date End Date Taking? Authorizing Provider  clonazePAM (KLONOPIN) 1 MG tablet Take 0.25 mg by mouth at bedtime.   Yes Historical Provider, MD  diltiazem (CARDIZEM CD) 240 MG 24 hr capsule Take 240 mg by mouth daily.   Yes Historical Provider, MD  escitalopram (LEXAPRO) 10 MG tablet Take 20 mg by mouth at bedtime.    Yes Historical Provider, MD  losartan (COZAAR) 50 MG tablet TAKE 1 TABLET(50 MG) BY MOUTH TWICE DAILY Patient taking differently: TAKE 1 TABLET(50 MG) BY MOUTH ONCE DAILY IN THE EVENING 05/22/15  Yes Evans Lance, MD  metFORMIN (GLUCOPHAGE) 500 MG tablet Take 500 mg by mouth daily with breakfast.    Yes Historical Provider, MD  metoprolol tartrate (LOPRESSOR) 25 MG tablet Take 25 mg by mouth 2 (two) times daily.    Yes Historical Provider, MD  warfarin (COUMADIN) 5 MG tablet Take 2.5-5 mg by mouth daily. Take 2.5 mg on Mon / Tue / Thurs / Sat / Sun Take 5 mg on Wed / Fri   Yes Historical Provider, MD    Physical Exam: Vitals:   04/29/16 1200 04/29/16 1419 04/29/16 1430 04/29/16 1500  BP: (!) 184/112 (!) 190/107 (!) 172/117 (!) 194/115  Pulse: 93 91  89  Resp:      Temp:      TempSrc:      SpO2: 91% 93%  94%     General: Appears calm and comfortable Eyes: PERRLA, EOMI, normal lids, iris ENT:  grossly normal hearing, lips & tongue, mucous membranes moist and intact Neck: no lymphoadenopathy, masses or thyromegaly Cardiovascular: RRR, no m/r/g. No JVD, carotid bruits. No LE edema.  Respiratory: bilateral  no wheezes, rales, rhonchi or cracles. Normal respiratory effort. No accessory muscle use observed Abdomen: soft, non-tender, non-distended, no organomegaly or masses appreciated. BS present in all quadrants Skin: no rash, ulcers or induration seen on limited exam Musculoskeletal: Right upper extremity rests in elevated position on the pillow covered in Ace wrap, otherwise grossly normal tone BUE/BLE, good ROM, no bony abnormality or joint deformities observed Psychiatric: grossly normal mood and affect, speech fluent and appropriate, alert and oriented x3 Neurologic: CN II-XII grossly intact, moves all extremities in coordinated fashion, sensation intact  Labs on Admission: I have personally reviewed following labs and imaging studies  CBC, BMP  GFR: CrCl cannot be calculated (Unknown ideal weight.).  Creatinine Clearance: CrCl cannot be calculated (Unknown ideal weight.).  Radiological Exams on Admission: Dg Wrist Complete Right  Result Date: 04/29/2016 CLINICAL DATA:  Fall. EXAM: RIGHT WRIST - COMPLETE 3+ VIEW COMPARISON:  02/07/2016 . FINDINGS: Comminuted severely angulated fracture of the distal right radial metaphysis is noted. Diffuse degenerative change. Peripheral vascular  calcification . IMPRESSION: 1. Comminuted severely angulated fracture of the distal radius noted. 2. Peripheral vascular disease . Electronically Signed   By: Marcello Moores  Register   On: 04/29/2016 07:35    EKG: not found  Assessment/Plan Principal Problem:   Wrist fracture, right Active Problems:   Atrial fibrillation ([facility])   CAD (coronary artery disease)   Essential hypertension, benign   Chronic diastolic heart failure ([facility])   Frequent falls   DM hyperosmolarity type II ([place])   Right wrist comminuted fracture - requires surgical repair Ortho service contacted and will see patient later today  Keep nothing by mouth after midnight  Frequent falls concerning for fainting, versus TIA, versus EEG, versus cardiac arrhythmia with rapid heart rate Patient reported being pending in January preceding her fall Might benefit from neuro consult Continue to monitor on telemetry, obtain EEG. Patient has ICD and cannot have MRI  Atrial fibrillation with variable heart rate Continue Toprol and Cardizem and adjust doses as needed Coumadin was placed on hold and patient is now on IV heparin drip, which could be discontinued 1-11/2 hour prior to surgical procedure Continue to monitor her PT/INR  Hypertension - suboptimally controlled, which could be associated with poor pain control and or not having her medications since morning Home medications restarted, hydralazine IV when necessary added with parameters as needed for blood pressure control  CAD with history of CHF Patient is, stable from cardiovascular standpoint, has no complaints of anginal pain, no dyspnea Continue to monitory  Diabetes mellitus type 2 Hold Glucophage, add sliding scale insulin, monitor for CBGs every before meals and at bedtime, initiate carb modified diet  VT prophylaxis: Full dose heparin drip  Code Status: Full Family Communication: At bedside Disposition Plan: Telemetry Consults called: Ortho service by  EDP Admission status: Inpatient   York Grice, [place] Pager: [phone] [facility]  If 7PM-7AM, please contact night-coverage [email or web address] Password [email or web address]  04/29/2016, 3:10 PM

## 2016-04-29 NOTE — ED Triage Notes (Signed)
Pt sts fell out of bed this am; deformity noted to right wrist with CMS intact

## 2016-04-29 NOTE — ED Provider Notes (Signed)
[facility] DEPT Provider Note   CSN: [id number] Arrival date & time: 04/29/16  0702     History   Chief Complaint Chief Complaint  Patient presents with  . Fall    HPI Traci [name] is a 82 y.o. female hx of afib on coumadin, Previous pelvic fracture, CAD here presenting with fall. Patient states that she got out of bed and slipped and fell on the right wrist. Denies passing out and definitely did not hit her head. She does have frequent falls and was admitted several months ago for pelvic fracture. She states that her level was therapeutic about 2 weeks ago.  The history is provided by the patient.    Past Medical History:  Diagnosis Date  . Arthritis   . Atrial fibrillation ([place])   . Carotid artery disease ([place])    Right carotid endarectomy 1999  . Chronic anticoagulation    Followed by PMD  . Coronary atherosclerosis    a. Minor at cardiac catheterization 2005 b. cath 10/16/2014 40% prox LAD dx, otherwise minimal CAD  . Depression   . Essential hypertension   . Glucose intolerance (impaired glucose tolerance)   . History of kidney stones   . History of pneumonia    2010  . Hyperlipidemia   . Sick sinus syndrome [facility])    [employer] PPM    Patient Active Problem List   Diagnosis Date Noted  . Fall 02/06/2016  . Acute blood loss anemia 02/06/2016  . Closed fracture of coccyx ([place]) 02/06/2016  . Female pelvic hematoma 02/02/2016  . Chest pain at rest 10/17/2014  . Chronic diastolic heart failure ([place]) 10/14/2014  . Peripheral neuropathy ([place]) 08/31/2013  . Leg swelling 08/31/2013  . Encounter for monitoring coumadin therapy 04/06/2013  . CAD (coronary artery disease) 04/06/2013  . OA (osteoarthritis) 04/06/2013  . Other malaise and fatigue 04/06/2013  . Essential hypertension, benign 04/06/2013  . Atrial fibrillation ([place]) 06/11/2010  . HYPERLIPIDEMIA 02/26/2010  . Cardiac pacemaker in situ 06/13/2009    Past Surgical History:  Procedure Laterality Date    . ABDOMINAL HYSTERECTOMY    . APPENDECTOMY    . BREAST BIOPSY Bilateral    x 7 total  . CARDIAC CATHETERIZATION N/A 10/16/2014   Procedure: Left Heart Cath and Coronary Angiography;  Surgeon: Leonie Man, MD;  Location: [facility] LAB;  Service: Cardiovascular;  Laterality: N/A;  . Carotid endarectomy Right    1999  . COLONOSCOPY     2006  . CYSTOSCOPY W/ URETERAL STENT PLACEMENT Left 04/19/2012   Procedure: CYSTOSCOPY WITH RETROGRADE PYELOGRAM/URETERAL STENT PLACEMENT ;  Surgeon: Ailene Rud, MD;  Location: [facility];  Service: Urology;  Laterality: Left;  . CYSTOSCOPY WITH RETROGRADE PYELOGRAM, URETEROSCOPY AND STENT PLACEMENT Left 06/30/2012   Procedure: CYSTOSCOPY WITH LEFT  RETROGRADE PYELOGRAM, URETEROSCOPY  with basketing of stone, AND STENT PLACEMENT, TRANSURETHRAL UNROOFING OF URETER.;  Surgeon: Alexis Frock, MD;  Location: [facility];  Service: Urology;  Laterality: Left;  . EYE SURGERY Bilateral    Cataract extraction with IOL  . PACEMAKER PLACEMENT     2006    OB History    No data available       Home Medications    Prior to Admission medications   Medication Sig Start Date End Date Taking? Authorizing Provider  amLODipine (NORVASC) 5 MG tablet Take 1 tablet (5 mg total) by mouth daily. Patient taking differently: Take 5 mg by mouth every morning.  11/08/13   Lenise Herald  Bronson Ing, MD  Blood Glucose Monitoring Suppl (BLOOD GLUCOSE METER KIT AND SUPPLIES) Dispense based on patient and insurance preference. Use to monitor FSBS one time daily as directed. DX: 250.00 09/05/13   Alycia Rossetti, MD  citalopram (CELEXA) 40 MG tablet Take 40 mg by mouth every morning.    Historical Provider, MD  digoxin (LANOXIN) 0.125 MG tablet Take 1 tablet (125 mcg total) by mouth every morning. 06/06/13   Alycia Rossetti, MD  escitalopram (LEXAPRO) 10 MG tablet Take 10 mg by mouth at bedtime.    Historical Provider, MD  LORazepam (ATIVAN) 0.5 MG tablet Take 0.5 mg by mouth at  bedtime.    Historical Provider, MD  losartan (COZAAR) 50 MG tablet TAKE 1 TABLET(50 MG) BY MOUTH TWICE DAILY Patient taking differently: TAKE 1 TABLET(50 MG) BY MOUTH ONCE DAILY IN THE EVENING 05/22/15   Evans Lance, MD  metFORMIN (GLUCOPHAGE) 500 MG tablet Take 500 mg by mouth 2 (two) times daily with a meal.    Historical Provider, MD  metoprolol tartrate (LOPRESSOR) 25 MG tablet Take 25 mg by mouth 2 (two) times daily.    Historical Provider, MD    Family History Family History  Problem Relation Age of Onset  . Heart attack Mother     Died age 47  . Hyperlipidemia Mother   . Diabetes Mother   . Coronary artery disease Father   . Alcohol abuse Father   . Hyperlipidemia Father   . Diabetes Sister   . Hypertension Sister   . Breast cancer Sister   . Breast cancer Sister     Social History Social History  Substance Use Topics  . Smoking status: Never Smoker  . Smokeless tobacco: Never Used  . Alcohol use No     Allergies   Morphine and Penicillins   Review of Systems Review of Systems  Musculoskeletal:       R wrist pain   All other systems reviewed and are negative.    Physical Exam Updated Vital Signs BP (!) 186/99 (BP Location: Left Arm)   Pulse 76   Temp 97.9 F (36.6 C) (Oral)   Resp 18   SpO2 93%   Physical Exam  Constitutional: She is oriented to person, place, and time.  Chronically ill   HENT:  Head: Normocephalic and atraumatic.  Mouth/Throat: Oropharynx is clear and moist.  Eyes: EOM are normal. Pupils are equal, round, and reactive to light.  Neck: Normal range of motion. Neck supple.  Cardiovascular: Normal rate, regular rhythm and normal heart sounds.   Pulmonary/Chest: Effort normal and breath sounds normal. No respiratory distress. She has no wheezes.  Abdominal: Soft. Bowel sounds are normal. She exhibits no distension. There is no tenderness.  Musculoskeletal:  R wrist obvious deformity. 2+ radial pulses, able to wiggle fingers. Nl  capillary refill. Pelvis stable, no spinal tenderness, no other obvious extremity trauma   Neurological: She is alert and oriented to person, place, and time.  Skin: Skin is warm.  Psychiatric: She has a normal mood and affect.  Nursing note and vitals reviewed.    ED Treatments / Results  Labs (all labs ordered are listed, but only abnormal results are displayed) Labs Reviewed - No data to display  EKG  EKG Interpretation None       Radiology Dg Wrist Complete Right  Result Date: 04/29/2016 CLINICAL DATA:  Fall. EXAM: RIGHT WRIST - COMPLETE 3+ VIEW COMPARISON:  02/07/2016 . FINDINGS: Comminuted severely angulated fracture of  the distal right radial metaphysis is noted. Diffuse degenerative change. Peripheral vascular calcification . IMPRESSION: 1. Comminuted severely angulated fracture of the distal radius noted. 2. Peripheral vascular disease . Electronically Signed   By: Marcello Moores  Register   On: 04/29/2016 07:35    Procedures Procedures (including critical care time)  Medications Ordered in ED Medications  oxyCODONE-acetaminophen (PERCOCET/ROXICET) 5-325 MG per tablet 1 tablet (not administered)     Initial Impression / Assessment and Plan / ED Course  I have reviewed the triage vital signs and the nursing notes.  Pertinent labs & imaging results that were available during my care of the patient were reviewed by me and considered in my medical decision making (see chart for details).     MADDELYN ROCCA is a 82 y.o. female here with R wrist pain s/p fall. Likely wrist fracture. No head/neck or other injuries. Will get wrist xray.   10 am Xray showed distal radius fracture. Dr. Amedeo Plenty consulted. His PA saw patient and recommend medical admission for preop clearance.   12:30 PM Labs unremarkable. INR 1.6. Patient follows up at [facility] family practice. Will page for unassigned admission.   1:26 PM Talked to [facility], who will discuss with Dr. Amedeo Plenty.   2 pm [facility] to  admit.   Final Clinical Impressions(s) / ED Diagnoses   Final diagnoses:  None    New Prescriptions New Prescriptions   No medications on file     Drenda Freeze, MD 04/29/16 1545

## 2016-04-30 ENCOUNTER — Inpatient Hospital Stay (HOSPITAL_COMMUNITY): Payer: PPO

## 2016-04-30 DIAGNOSIS — I480 Paroxysmal atrial fibrillation: Secondary | ICD-10-CM

## 2016-04-30 DIAGNOSIS — R609 Edema, unspecified: Secondary | ICD-10-CM

## 2016-04-30 DIAGNOSIS — I2510 Atherosclerotic heart disease of native coronary artery without angina pectoris: Secondary | ICD-10-CM

## 2016-04-30 DIAGNOSIS — E1100 Type 2 diabetes mellitus with hyperosmolarity without nonketotic hyperglycemic-hyperosmolar coma (NKHHC): Secondary | ICD-10-CM

## 2016-04-30 DIAGNOSIS — R5381 Other malaise: Secondary | ICD-10-CM

## 2016-04-30 DIAGNOSIS — I5032 Chronic diastolic (congestive) heart failure: Secondary | ICD-10-CM

## 2016-04-30 DIAGNOSIS — R296 Repeated falls: Secondary | ICD-10-CM

## 2016-04-30 DIAGNOSIS — I10 Essential (primary) hypertension: Secondary | ICD-10-CM

## 2016-04-30 DIAGNOSIS — S62101A Fracture of unspecified carpal bone, right wrist, initial encounter for closed fracture: Secondary | ICD-10-CM

## 2016-04-30 DIAGNOSIS — E118 Type 2 diabetes mellitus with unspecified complications: Secondary | ICD-10-CM

## 2016-04-30 LAB — BASIC METABOLIC PANEL
ANION GAP: 11 (ref 5–15)
BUN: 16 mg/dL (ref 6–20)
CO2: 24 mmol/L (ref 22–32)
Calcium: 9.4 mg/dL (ref 8.9–10.3)
Chloride: 102 mmol/L (ref 101–111)
Creatinine, Ser: 0.83 mg/dL (ref 0.44–1.00)
GFR calc Af Amer: >60 mL/min (ref >60)
Glucose, Bld: 155 mg/dL — ABNORMAL HIGH (ref 65–99)
POTASSIUM: 3.3 mmol/L — AB (ref 3.5–5.1)
SODIUM: 137 mmol/L (ref 135–145)

## 2016-04-30 LAB — GLUCOSE, CAPILLARY
GLUCOSE-CAPILLARY: 172 mg/dL — AB (ref 65–99)
GLUCOSE-CAPILLARY: 200 mg/dL — AB (ref 65–99)
Glucose-Capillary: 150 mg/dL — ABNORMAL HIGH (ref 65–99)
Glucose-Capillary: 158 mg/dL — ABNORMAL HIGH (ref 65–99)

## 2016-04-30 LAB — CBC
HEMATOCRIT: 40.3 % (ref 36.0–46.0)
Hemoglobin: 13.4 g/dL (ref 12.0–15.0)
MCH: 30.9 pg (ref 26.0–34.0)
MCHC: 33.3 g/dL (ref 30.0–36.0)
MCV: 93.1 fL (ref 78.0–100.0)
Platelets: 225 10*3/uL (ref 150–400)
RBC: 4.33 MIL/uL (ref 3.87–5.11)
RDW: 14.6 % (ref 11.5–15.5)
WBC: 13.9 10*3/uL — AB (ref 4.0–10.5)

## 2016-04-30 LAB — PROTIME-INR
INR: 1.42
Prothrombin Time: 17.4 seconds — ABNORMAL HIGH (ref 11.4–15.2)

## 2016-04-30 MED ORDER — HEPARIN (PORCINE) IN NACL 100-0.45 UNIT/ML-% IJ SOLN
1200.0000 [IU]/h | INTRAMUSCULAR | Status: DC
  Administered 2016-04-30: 1000 [IU]/h via INTRAVENOUS
  Filled 2016-04-30: qty 250

## 2016-04-30 MED ORDER — HYDRALAZINE HCL 20 MG/ML IJ SOLN
10.0000 mg | Freq: Four times a day (QID) | INTRAMUSCULAR | Status: DC | PRN
  Administered 2016-04-30 – 2016-05-01 (×3): 10 mg via INTRAVENOUS
  Filled 2016-04-30 (×3): qty 1

## 2016-04-30 MED ORDER — WARFARIN SODIUM 5 MG PO TABS
5.0000 mg | ORAL_TABLET | Freq: Once | ORAL | Status: AC
  Administered 2016-04-30: 5 mg via ORAL
  Filled 2016-04-30: qty 1

## 2016-04-30 MED ORDER — POTASSIUM CHLORIDE CRYS ER 20 MEQ PO TBCR
40.0000 meq | EXTENDED_RELEASE_TABLET | Freq: Once | ORAL | Status: AC
  Administered 2016-04-30: 40 meq via ORAL
  Filled 2016-04-30: qty 2

## 2016-04-30 MED ORDER — WARFARIN - PHARMACIST DOSING INPATIENT
Freq: Every day | Status: DC
  Administered 2016-04-30: 18:00:00

## 2016-04-30 NOTE — Progress Notes (Signed)
PROGRESS NOTE    Traci [name]  QKM:[id number] DOB: [birth year] DOA: 04/29/2016 PCP: [name], MD   Chief Complaint  Patient presents with  . Fall   Brief Narrative:  HPI On 04/29/2016 by [name], [place] Traci [name] is a 82 y.o. female with medical history significant of atrial fibrillation, on Coumadin anti-coagulation therapy, coronary artery disease, PAD with right carotid endarterectomy, hypertension, hyperlipidemia, diabetes mellitus, mild dementia who presented to the emergency department today after she sustained a fall earlier this morning landing on her right hand and sustaining a right wrist fracture. Patient did not report any dizziness or lightheadedness, does not report palpitations or loss of consciousness, on right-sided weakness. She said that she just all of a sudden slowly weaned down and fell on her right side.  Patient lives alone, but every night she sleeps over at her daughter's house because she has been prone to falling lately. She sustained 3 falls within the last 2 months. First fall was in January 2018 when she lacerated the vessel and fractured coccyx. During that admission she was confined to the ICU for 7 days. The family noted some gait instability and times patient has a look on her face like she is not present at the moment and stairs somewhere in the air with glossy eyes.   She sustained another fall yesterday on her daughter's porch landing over the plastic chair which she crashed, but fortunately didn't injure herself at that time.  Assessment & Plan   Right wrist comminuted fracture  -Secondary to fall -Orthopedics, Dr. Amedeo Plenty, consulted and appreciated -S/p ORIF right wrist fracture comminuted complex with DVR plate and screw construct -Currently has drain in place, to be discontinued/pulled later today  Frequent falls  -concerning for fainting, versus TIA, versus EEG, versus cardiac arrhythmia with rapid heart rate -Patient has ICD and cannot  have MRI -Currently no neurological deficits -EEG pending -Continue to monitor -PT/OT recommended SNF  Atrial fibrillation with variable heart rate -Continue Toprol and Cardizem  -Coumadin held due to surgery, to be resumed tonight after 8PM with heparin drip for bridging per pharmacy -CHADSVASC 6 (given gender, age, CHF, HTN, DM) -given frequent falls, should patient be on [facility]?  Essential Hypertension  -Continue metoprolol, losartan, cardizem -Will add hydralazine PRN  CAD with history of CHF -Currently no complaints of chest pain -Continue metoprolol, losartan  Diabetes mellitus type 2 -metformin held -Continue ISS and CBG monitoring   Depression -Continue lexapro  Left hand swelling -Xray showed degenerative changes, no acute abnormalities -Per patient currently improving -Keep arm elevated  DVT Prophylaxis  SCDs  Code Status: Full  Family Communication: None at bedside. Left message for daughter.  Disposition Plan: Admitted. SNF at discharge  Consultants Orthopedic sugery  Procedures  ORIF right wrist fracture comminuted complex with DVR plate and screw construct  Antibiotics   Anti-infectives    Start     Dose/Rate Route Frequency Ordered Stop   04/29/16 1915  ceFAZolin (ANCEF) IVPB 2g/100 mL premix  Status:  Discontinued     2 g 200 mL/hr over 30 Minutes Intravenous To Surgery 04/29/16 1856 04/29/16 2142   04/29/16 1905  vancomycin (VANCOCIN) 1-5 GM/200ML-% IVPB    Comments:  Trixie Deis   : cabinet override      04/29/16 1905 04/30/16 0714   04/29/16 1859  ceFAZolin (ANCEF) 2-4 GM/100ML-% IVPB  Status:  Discontinued    Comments:  Henrine Screws   : cabinet override  04/29/16 1859 04/29/16 1909      Subjective:   Traci [name] seen and examined today.  Complains of left hand swelling, but feels it is improving. Denies chest pain, shortness of breath, abdominal pain, nausea or vomiting, diarrhea constipation, current dizziness or headache. Does  have some pain in her right hand.  Objective:   Vitals:   04/30/16 0901 04/30/16 1322 04/30/16 1324 04/30/16 1326  BP: (!) 142/86 (!) 160/92 (!) 163/84 (!) 168/73  Pulse: 86 74 76 73  Resp:  18    Temp:  98.9 F (37.2 C)    TempSrc:  Oral    SpO2:  94%    Weight:      Height:        Intake/Output Summary (Last 24 hours) at 04/30/16 1449 Last data filed at 04/30/16 1315  Gross per 24 hour  Intake          1290.51 ml  Output             1175 ml  Net           115.51 ml   Filed Weights   04/29/16 1705  Weight: 65.1 kg (143 lb 8.3 oz)    Exam  General: Well developed, well nourished, NAD, appears stated age  31: NCAT,mucous membranes moist.   Cardiovascular: S1 S2 auscultated, irregularly irregular  Respiratory: Clear to auscultation bilaterally with equal chest rise  Abdomen: Soft, nontender, nondistended, + bowel sounds  Extremities: warm dry without cyanosis clubbing or edema. Right hand in dressing with drain. Left hand mild edema, no pain with palpation or movement.  Neuro: AAOx3, nonfocal  Psych: Normal affect and demeanor    Data Reviewed: I have personally reviewed following labs and imaging studies  CBC:  Recent Labs Lab 04/29/16 1030 04/30/16 0559  WBC 16.3* 13.9*  NEUTROABS 14.2*  --   HGB 14.5 13.4  HCT 43.5 40.3  MCV 92.6 93.1  PLT 257 762   Basic Metabolic Panel:  Recent Labs Lab 04/29/16 1030 04/30/16 0559  NA 139 137  K 3.7 3.3*  CL 102 102  CO2 26 24  GLUCOSE 195* 155*  BUN 15 16  CREATININE 0.84 0.83  CALCIUM 9.4 9.4   GFR: Estimated Creatinine Clearance: 49.5 mL/min (by C-G formula based on SCr of 0.83 mg/dL). Liver Function Tests: No results for input(s): AST, ALT, ALKPHOS, BILITOT, PROT, ALBUMIN in the last 168 hours. No results for input(s): LIPASE, AMYLASE in the last 168 hours. No results for input(s): AMMONIA in the last 168 hours. Coagulation Profile:  Recent Labs Lab 04/29/16 1030 04/30/16 0559  INR  1.65 1.42   Cardiac Enzymes: No results for input(s): CKTOTAL, CKMB, CKMBINDEX, TROPONINI in the last 168 hours. BNP (last 3 results) No results for input(s): PROBNP in the last 8760 hours. HbA1C: No results for input(s): HGBA1C in the last 72 hours. CBG:  Recent Labs Lab 04/29/16 1710 04/29/16 2048 04/30/16 0029 04/30/16 0756 04/30/16 1159  GLUCAP 166* 222* 158* 150* 172*   Lipid Profile: No results for input(s): CHOL, HDL, LDLCALC, TRIG, CHOLHDL, LDLDIRECT in the last 72 hours. Thyroid Function Tests: No results for input(s): TSH, T4TOTAL, FREET4, T3FREE, THYROIDAB in the last 72 hours. Anemia Panel: No results for input(s): VITAMINB12, FOLATE, FERRITIN, TIBC, IRON, RETICCTPCT in the last 72 hours. Urine analysis:    Component Value Date/Time   COLORURINE YELLOW 02/07/2016 1033   APPEARANCEUR CLEAR 02/07/2016 1033   LABSPEC 1.016 02/07/2016 1033   PHURINE 6.0 02/07/2016  [place] 02/07/2016 1033   [place] 02/07/2016 1033   [place] 02/07/2016 1033   KETONESUR NEGATIVE 02/07/2016 1033   PROTEINUR 30 (A) 02/07/2016 1033   UROBILINOGEN 0.2 04/19/2012 0833   NITRITE NEGATIVE 02/07/2016 1033   LEUKOCYTESUR NEGATIVE 02/07/2016 1033   Sepsis Labs: @LABRCNTIP (procalcitonin:4,lacticidven:4)  ) Recent Results (from the past 240 hour(s))  Surgical pcr screen     Status: Abnormal   Collection Time: 04/29/16  6:17 PM  Result Value Ref Range Status   MRSA, PCR NEGATIVE NEGATIVE Final   Staphylococcus aureus POSITIVE (A) NEGATIVE Final    Comment:        The Xpert SA Assay (FDA approved for NASAL specimens in patients over 12 years of age), is one component of a comprehensive surveillance program.  Test performance has been validated by [facility] for patients greater than or equal to 64 year old. It is not intended to diagnose infection nor to guide or monitor treatment.       Radiology Studies: Dg Wrist Complete  Left  Result Date: 04/30/2016 CLINICAL DATA:  Fall.  Pain. EXAM: LEFT WRIST - COMPLETE 3+ VIEW COMPARISON:  No recent prior . FINDINGS: Diffuse degenerative change. Degenerative changes are most prominent the first carpometacarpal joint. Subchondral cyst noted the base of the first carpal most likely degenerative. No evidence of fracture dislocation. Peripheral vascular calcification. IMPRESSION: 1. Diffuse degenerative change. Degenerative changes most prominent about the first carpometacarpal joint. No acute bony abnormality. 2. Peripheral vascular disease. Electronically Signed   By: Marcello Moores  Register   On: 04/30/2016 07:32   Dg Wrist Complete Right  Result Date: 04/29/2016 CLINICAL DATA:  Fall. EXAM: RIGHT WRIST - COMPLETE 3+ VIEW COMPARISON:  02/07/2016 . FINDINGS: Comminuted severely angulated fracture of the distal right radial metaphysis is noted. Diffuse degenerative change. Peripheral vascular calcification . IMPRESSION: 1. Comminuted severely angulated fracture of the distal radius noted. 2. Peripheral vascular disease . Electronically Signed   By: Marcello Moores  Register   On: 04/29/2016 07:35     Scheduled Meds: . clonazePAM  0.25 mg Oral QHS  . diltiazem  240 mg Oral Daily  . escitalopram  20 mg Oral QHS  . insulin aspart  0-9 Units Subcutaneous TID WC  . losartan  100 mg Oral Daily  . metoprolol tartrate  25 mg Oral BID  . sodium chloride flush  3 mL Intravenous Q12H  . vitamin C  1,000 mg Oral Daily  . warfarin  5 mg Oral Once  . Warfarin - Pharmacist Dosing Inpatient   Does not apply q1800   Continuous Infusions: . sodium chloride 50 mL/hr at 04/30/16 0610  . heparin       LOS: 1 day   Time Spent in minutes   30 minutes  Tomeeka Plaugher D.O. on 04/30/2016 at 2:49 PM  Between 7am to 7pm - Pager - [phone]  After 7pm go to [email or web address] - password [email or web address]  And look for the night coverage person covering for me after hours  [facility] Office  [phone]

## 2016-04-30 NOTE — Progress Notes (Signed)
  SP ORIF Right radius fracture 04/29/2016   Patient has been seen and examined. Patient has pain appropriate to her injury/process. Patient denies new complaints at this present time. I have discussed the care pathway with nursing staff. Patient is appropriate and alert. VSS AF We reviewed vital signs and intake output which are stable.  The upper extremity is neurovascularly intact. Refill is normal. There is no signs of compartment syndrome. There is no signs of dystrophy. There is normal sensation.  I have spent a  great deal of time discussing range of motion edema control and other techniques to decrease edema and promote flexion extension of the fingers. Patient understands the importance of elevation range of motion massage and other measures to lessen pain and prevent swelling.  We have also discussed immobilization to appropriate areas involved.  We have discussed with the patient shoulder range of motion to prevent adhesive capsulitis.   Drain was removed without difficulty  We discussed SNF for rehab and transition to outpatient care.  She is willing to move towards a SNF for short term evaluation to promote her health and wellness in a improved fashion. Ok to dc to SNF from my [place]point  Will need followup in 2 weeks  OK to resume anti coagulants     All questions have been incurred and answered.    Tramayne Sebesta MDPatient ID: Arman Filter, female   DOB: 09/01/1934, 82 y.o.   MRN: [id number]

## 2016-04-30 NOTE — OR Nursing (Signed)
Late entry due to implant documentation correction.

## 2016-04-30 NOTE — Evaluation (Signed)
Physical Therapy Evaluation Patient Details Name: Traci [name] MRN: [id number] DOB: 1934-12-31 Today's Date: 04/30/2016   History of Present Illness  Pt is a 82 y/o female s/p RUE ORIF secondary to R communited distal radius fx. PMH includes HTN, CAD, PVD, afib, pacemaker, DM, sick sinus syndrome, and history of falls.   Clinical Impression  Pt s/p surgery above, with deficits below. PTA, pt was independent with functional mobility; reports she stayed with daughter at night. Upon evaluation, pt requiring mod A for functional mobility tasks. Educated pt about current deficits and recommendations for SNF to improve independence with functional mobility. States she will "sleep on it" and discuss with daughter. Recommendations below. Will continue to follow to maximize functional mobility independence and safety.     Follow Up Recommendations SNF;Supervision/Assistance - 24 hour    Equipment Recommendations  None recommended by PT    Recommendations for Other Services       Precautions / Restrictions Precautions Precautions: Fall Restrictions Weight Bearing Restrictions: Yes RUE Weight Bearing: Non weight bearing      Mobility  Bed Mobility Overal bed mobility: Needs Assistance Bed Mobility: Supine to Sit;Sit to Supine     Supine to sit: Mod assist;HOB elevated Sit to supine: Min assist   General bed mobility comments: Mod assist required for trunk elevation during bed mobility and scooting EOB. Min A for controlled lowering for sit>supine.   Transfers Overall transfer level: Needs assistance Equipment used: 1 person hand held assist Transfers: Sit to/from Stand Sit to Stand: Mod assist         General transfer comment: mod A for lift assist to stand. Pt requiring HHA for transfer. Verbal cues to power through BLE.   Ambulation/Gait Ambulation/Gait assistance: Mod assist Ambulation Distance (Feet): 30 Feet Assistive device: 1 person hand held assist Gait  Pattern/deviations: Step-through pattern;Decreased stride length;Drifts right/left Gait velocity: Decreased Gait velocity interpretation: Below normal speed for age/gender General Gait Details: Pt with slow, unsteady gait requiring mod A for steadying. Pt demonstrated 1 LOB when turning out of door and requried mod A to prevent LOB. Distance limited by fatigue. Oxygen sats at 90% after gait training.   Stairs            Wheelchair Mobility    Modified Rankin (Stroke Patients Only)       Balance Overall balance assessment: Needs assistance Sitting-balance support: No upper extremity supported;Feet supported Sitting balance-Leahy Scale: Fair     Standing balance support: Single extremity supported;During functional activity Standing balance-Leahy Scale: Poor Standing balance comment: HHA plus mod A for steadying.                              Pertinent Vitals/Pain Pain Assessment: No/denies pain    Home Living Family/patient expects to be discharged to:: Private residence Living Arrangements: Alone (Go over to oldest daughters at night ) Available Help at Discharge: Family;Available PRN/intermittently Type of Home: House Home Access: Level entry;Stairs to enter (both ) Entrance Stairs-Rails: Right;Left;Can reach both Entrance Stairs-Number of Steps: 2 Home Layout: Two level (dont go upstairs ) Home Equipment: Walker - 2 wheels;Shower seat;Bedside commode;Hand held shower head;Cane - single point      Prior Function Level of Independence: Independent      ADL's / Homemaking Assistance Needed: Duaghter does most of the cooking but pt responsible for meals during day.  Comments: Did not use AD "unless she had to"  Hand Dominance   Dominant Hand: Right    Extremity/Trunk Assessment   Upper Extremity Assessment Upper Extremity Assessment: RUE deficits/detail;LUE deficits/detail RUE Deficits / Details: NWB  LUE Deficits / Details: Reports LUEl is  numb but feels better than yesterday.     Lower Extremity Assessment Lower Extremity Assessment: Generalized weakness (grossly 4-/5 throughout BLE. )    Cervical / Trunk Assessment Cervical / Trunk Assessment: Kyphotic  Communication   Communication: No difficulties  Cognition Arousal/Alertness: Awake/alert Behavior During Therapy: WFL for tasks assessed/performed Overall Cognitive Status: Within Functional Limits for tasks assessed                                        General Comments General comments (skin integrity, edema, etc.): Educated pt about rehab secondary to current deficits. States she will "sleep on it" and talk to her daughter about it.     Exercises     Assessment/Plan    PT Assessment Patient needs continued PT services  PT Problem List Decreased strength;Decreased activity tolerance;Decreased balance;Decreased mobility;Decreased coordination;Decreased knowledge of use of DME;Pain       PT Treatment Interventions DME instruction;Gait training;Stair training;Functional mobility training;Therapeutic activities;Balance training;Therapeutic exercise;Neuromuscular re-education;Patient/family education    PT Goals (Current goals can be found in the Care Plan section)  Acute Rehab PT Goals Patient Stated Goal: to go home  PT Goal Formulation: With patient Time For Goal Achievement: 05/14/16 Potential to Achieve Goals: Good    Frequency Min 3X/week   Barriers to discharge Inaccessible home environment Daughter available PRN.     Co-evaluation               End of Session Equipment Utilized During Treatment: Gait belt Activity Tolerance: Patient limited by fatigue Patient left: in bed;with call bell/phone within reach;with bed alarm set Nurse Communication: Mobility status;Other (comment) (pt wanting ice cream; notified RN) PT Visit Diagnosis: Unsteadiness on feet (R26.81);History of falling (Z91.81);Pain Pain - Right/Left:  Right Pain - part of body: Arm    Time: 7253-6644 PT Time Calculation (min) (ACUTE ONLY): 17 min   Charges:   PT Evaluation $PT Eval Moderate Complexity: 1 Procedure     PT G Codes:        Nicky Pugh, PT, DPT  Acute Rehabilitation Services  Pager: [phone]   Army Melia 04/30/2016, 12:38 PM

## 2016-04-30 NOTE — Progress Notes (Addendum)
ANTICOAGULATION CONSULT NOTE - Follow Up Consult  Pharmacy Consult for Heparin and Coumadin Indication: atrial fibrillation  Allergies  Allergen Reactions  . Morphine Nausea Only  . Penicillins Other (See Comments)    Has patient had a PCN reaction causing immediate rash, facial/tongue/throat swelling, SOB or lightheadedness with hypotension: NO Has patient had a PCN reaction causing severe rash involving mucus membranes or skin necrosis: no Has patient had a PCN reaction that required hospitalization: NO Has patient had a PCN reaction occurring within the last 10 years: NO If all of the above answers are "NO", then may proceed with Cephalosporin use.     Patient Measurements: Height: 5\' 5"  (165.1 cm) Weight: 143 lb 8.3 oz (65.1 kg) IBW/kg (Calculated) : 57 Heparin Dosing Weight: 65.1 kg  Vital Signs: Temp: 98.9 F (37.2 C) (04/04 1322) Temp Source: Oral (04/04 1322) BP: 168/73 (04/04 1326) Pulse Rate: 73 (04/04 1326)  Labs:  Recent Labs  04/29/16 1030 04/30/16 0559  HGB 14.5 13.4  HCT 43.5 40.3  PLT 257 225  LABPROT 19.7* 17.4*  INR 1.65 1.42  CREATININE 0.84 0.83    Estimated Creatinine Clearance: 49.5 mL/min (by C-G formula based on SCr of 0.83 mg/dL).   Medications:  Scheduled:  . clonazePAM  0.25 mg Oral QHS  . diltiazem  240 mg Oral Daily  . escitalopram  20 mg Oral QHS  . insulin aspart  0-9 Units Subcutaneous TID WC  . losartan  100 mg Oral Daily  . metoprolol tartrate  25 mg Oral BID  . sodium chloride flush  3 mL Intravenous Q12H  . vitamin C  1,000 mg Oral Daily   Infusions:  . sodium chloride 50 mL/hr at 04/30/16 0610    Assessment: 82 yo F on Coumadin PTA for hx of afib.  Pt presented to ED after a fall resulting in R wrist fracture.  Coumadin was held and patient was bridged for OR repair on 4/3.  Plan to restart anticoagulation tonight at 8pm.  Goal of Therapy:  INR 2-3 Heparin level 0.3-0.7 units/ml Monitor platelets by  anticoagulation protocol: Yes   Plan:  Start heparin at 1000 units/hr (start at 8pm)  Coumadin 5mg  PO x 1 tonight at 8pm Heparin level, CBC, and INR daily  [employer], Pharm.D., BCPS Clinical Pharmacist Pager [phone] 04/30/2016 2:43 PM

## 2016-04-30 NOTE — Evaluation (Signed)
Occupational Therapy Evaluation Patient Details Name: Traci [name] MRN: [id number] DOB: 06/19/1934 Today's Date: 04/30/2016    History of Present Illness Pt is a 82 y/o female s/p RUE ORIF secondary to R communited distal radius fx. PMH includes HTN, CAD, PVD, afib, pacemaker, DM, sick sinus syndrome, and history of falls.    Clinical Impression   PTA, pt was independent with ADL and functional mobility. Pt reports that she lives alone but goes to her daughter's house overnight everyday. She currently requires overall moderate assistance with ADL and functional mobility. Educated pt on B digit AROM for edema management. Pt demonstrates significant functional decline and would benefit from SNF placement for continued rehabilitation in order to maximize return to PLOF. Provided education concerning current functional deficits and recommendation for short-term SNF for rehabilitation but pt very hesitant and reports preference to return home. OT will continue to follow acutely.    Follow Up Recommendations  SNF;Supervision/Assistance - 24 hour    Equipment Recommendations  Other (comment) (TBD at next venue of care)    Recommendations for Other Services       Precautions / Restrictions Precautions Precautions: Fall Restrictions Weight Bearing Restrictions: Yes RUE Weight Bearing: Non weight bearing      Mobility Bed Mobility Overal bed mobility: Needs Assistance Bed Mobility: Supine to Sit;Sit to Supine     Supine to sit: Mod assist;HOB elevated Sit to supine: Min assist   General bed mobility comments: Mod assist required for trunk elevation during bed mobility and scooting EOB. Min A for controlled lowering for sit>supine.   Transfers Overall transfer level: Needs assistance Equipment used: 1 person hand held assist Transfers: Sit to/from Stand Sit to Stand: Mod assist         General transfer comment: Mod lifting assist and verbal cues to maintian R UE NWB. Difficulty  scooting to EOB in preparation for standing.    Balance Overall balance assessment: Needs assistance Sitting-balance support: No upper extremity supported;Feet supported Sitting balance-Leahy Scale: Fair     Standing balance support: Single extremity supported;During functional activity Standing balance-Leahy Scale: Poor Standing balance comment: HHA plus mod A for steadying.                            ADL either performed or assessed with clinical judgement   ADL Overall ADL's : Needs assistance/impaired Eating/Feeding: Minimal assistance;Sitting Eating/Feeding Details (indicate cue type and reason): Requires set-up. Grooming: Moderate assistance;Sitting   Upper Body Bathing: Moderate assistance;Sitting   Lower Body Bathing: Moderate assistance;Sit to/from stand   Upper Body Dressing : Moderate assistance;Sitting   Lower Body Dressing: Moderate assistance;Sit to/from stand   Toilet Transfer: Moderate assistance;Ambulation;BSC   Toileting- Clothing Manipulation and Hygiene: Moderate assistance;Sit to/from stand       Functional mobility during ADLs: Moderate assistance General ADL Comments: Pt grasping furniture and requiring mod assist for functional mobility. Difficulty with bimanual tasks and requiring overall mod assist for ADL.     Vision Patient Visual Report: No change from baseline Vision Assessment?: No apparent visual deficits     Perception     Praxis      Pertinent Vitals/Pain Pain Assessment: Faces Faces Pain Scale: Hurts even more Pain Location: with R digit gentle AROM Pain Descriptors / Indicators: Grimacing;Aching;Operative site guarding;Sore Pain Intervention(s): Monitored during session;Repositioned;Limited activity within patient's tolerance     Hand Dominance Right   Extremity/Trunk Assessment Upper Extremity Assessment Upper Extremity Assessment: RUE deficits/detail;LUE deficits/detail  RUE Deficits / Details: Able to gently  move digits. Elbow and shoulder ROM WFL. RUE: Unable to fully assess due to immobilization LUE Deficits / Details: Edema to dorsal aspect of L hand but reports feels better than yesterday and has no pain today.   Lower Extremity Assessment Lower Extremity Assessment: Generalized weakness   Cervical / Trunk Assessment Cervical / Trunk Assessment: Kyphotic   Communication Communication Communication: No difficulties   Cognition Arousal/Alertness: Awake/alert Behavior During Therapy: WFL for tasks assessed/performed Overall Cognitive Status: Within Functional Limits for tasks assessed                                     General Comments  Educated pt about rehab secondary to current deficits. States she will "sleep on it" and talk to her daughter about it.     Exercises Exercises: Other exercises Other Exercises Other Exercises: Educated pt on edema management strategies for B hands including elevation and digit AROM.   Shoulder Instructions      Home Living Family/patient expects to be discharged to:: Private residence Living Arrangements: Alone (stays at daughter's home at night) Available Help at Discharge: Family;Available PRN/intermittently Type of Home: House Home Access: Level entry;Stairs to enter (has both) Entrance Stairs-Number of Steps: 2 Entrance Stairs-Rails: Right;Left;Can reach both Home Layout: Two level (does not go upstairs.)     Bathroom Shower/Tub: Occupational psychologist: Standard     Home Equipment: Environmental consultant - 2 wheels;Shower seat;Bedside commode;Hand held shower head;Cane - single point          Prior Functioning/Environment Level of Independence: Needs assistance    ADL's / Homemaking Assistance Needed: Duaghter does most of the cooking but pt responsible for meals during day.   Comments: Did not use AD "unless she had to"        OT Problem List: Decreased strength;Decreased range of motion;Decreased activity  tolerance;Impaired balance (sitting and/or standing);Decreased safety awareness;Decreased knowledge of use of DME or AE;Decreased knowledge of precautions;Pain;Impaired UE functional use      OT Treatment/Interventions: Self-care/ADL training;Therapeutic exercise;Therapeutic activities;Patient/family education;Balance training;Energy conservation    OT Goals(Current goals can be found in the care plan section) Acute Rehab OT Goals Patient Stated Goal: to go home  OT Goal Formulation: With patient Time For Goal Achievement: 05/14/16 Potential to Achieve Goals: Good ADL Goals Pt Will Perform Grooming: standing;with supervision Pt Will Perform Upper Body Dressing: with supervision;sitting Pt Will Perform Lower Body Dressing: with supervision;sit to/from stand Pt Will Transfer to Toilet: with modified independence;ambulating;bedside commode (BSC over toilet) Pt Will Perform Toileting - Clothing Manipulation and hygiene: with supervision;sit to/from stand Pt/caregiver will Perform Home Exercise Program: With written HEP provided;Independently;Both right and left upper extremity (digit AROM for edema management.)  OT Frequency: Min 2X/week   Barriers to D/C:            Co-evaluation              End of Session Equipment Utilized During Treatment: Gait belt Nurse Communication: Mobility status  Activity Tolerance: Patient tolerated treatment well Patient left: in bed;with call bell/phone within reach;with bed alarm set;with nursing/sitter in room  OT Visit Diagnosis: Pain;Muscle weakness (generalized) (M62.81);Unsteadiness on feet (R26.81) Pain - Right/Left: Right Pain - part of body: Arm                Time: 1027-1110 OT Time Calculation (min): 43 min Charges:  OT General Charges $OT Visit: 1 Procedure OT Evaluation $OT Eval Moderate Complexity: 1 Procedure OT Treatments $Self Care/Home Management : 23-37 mins G-Codes:     Norman Herrlich, MS OTR/L  Pager:  Yeadon A Sharicka Pogorzelski 04/30/2016, 2:17 PM

## 2016-04-30 NOTE — Progress Notes (Signed)
I called pt daughter teresa about doctors plan for her mom lt swollen hand, pt went for X-RAY this morning

## 2016-04-30 NOTE — Progress Notes (Signed)
Pt returned from surgery fully alert and oriented, has no pain, vital signs and CBG checked has AC wrap with drainage tube in place, rt hand feels warm and numb pt not able to move her fingers at this moment due to numbness, food offered to pt but refused, SCDS hooked to pt blt lower legs will continue to monitor

## 2016-05-01 ENCOUNTER — Encounter (HOSPITAL_COMMUNITY): Payer: Self-pay | Admitting: Orthopedic Surgery

## 2016-05-01 ENCOUNTER — Non-Acute Institutional Stay (SKILLED_NURSING_FACILITY): Payer: PPO | Admitting: Internal Medicine

## 2016-05-01 ENCOUNTER — Inpatient Hospital Stay
Admission: RE | Admit: 2016-05-01 | Discharge: 2016-05-23 | Disposition: A | Discharge status: Home / Self Care | Payer: PPO | Source: Ambulatory Visit | Attending: Internal Medicine | Admitting: Internal Medicine

## 2016-05-01 DIAGNOSIS — D72829 Elevated white blood cell count, unspecified: Secondary | ICD-10-CM

## 2016-05-01 DIAGNOSIS — M1990 Unspecified osteoarthritis, unspecified site: Secondary | ICD-10-CM | POA: Diagnosis not present

## 2016-05-01 DIAGNOSIS — I639 Cerebral infarction, unspecified: Principal | ICD-10-CM

## 2016-05-01 DIAGNOSIS — S62101D Fracture of unspecified carpal bone, right wrist, subsequent encounter for fracture with routine healing: Secondary | ICD-10-CM | POA: Diagnosis not present

## 2016-05-01 DIAGNOSIS — R262 Difficulty in walking, not elsewhere classified: Secondary | ICD-10-CM | POA: Diagnosis not present

## 2016-05-01 DIAGNOSIS — S52511D Displaced fracture of right radial styloid process, subsequent encounter for closed fracture with routine healing: Secondary | ICD-10-CM | POA: Diagnosis not present

## 2016-05-01 DIAGNOSIS — Z950 Presence of cardiac pacemaker: Secondary | ICD-10-CM | POA: Diagnosis not present

## 2016-05-01 DIAGNOSIS — I480 Paroxysmal atrial fibrillation: Secondary | ICD-10-CM | POA: Diagnosis not present

## 2016-05-01 DIAGNOSIS — Z7901 Long term (current) use of anticoagulants: Secondary | ICD-10-CM | POA: Diagnosis not present

## 2016-05-01 DIAGNOSIS — R5381 Other malaise: Secondary | ICD-10-CM | POA: Diagnosis not present

## 2016-05-01 DIAGNOSIS — R609 Edema, unspecified: Secondary | ICD-10-CM | POA: Diagnosis not present

## 2016-05-01 DIAGNOSIS — M25531 Pain in right wrist: Secondary | ICD-10-CM | POA: Diagnosis not present

## 2016-05-01 DIAGNOSIS — W19XXXD Unspecified fall, subsequent encounter: Secondary | ICD-10-CM

## 2016-05-01 DIAGNOSIS — F329 Major depressive disorder, single episode, unspecified: Secondary | ICD-10-CM | POA: Diagnosis not present

## 2016-05-01 DIAGNOSIS — I10 Essential (primary) hypertension: Secondary | ICD-10-CM | POA: Diagnosis not present

## 2016-05-01 DIAGNOSIS — I5032 Chronic diastolic (congestive) heart failure: Secondary | ICD-10-CM | POA: Diagnosis not present

## 2016-05-01 DIAGNOSIS — E785 Hyperlipidemia, unspecified: Secondary | ICD-10-CM | POA: Diagnosis not present

## 2016-05-01 DIAGNOSIS — R296 Repeated falls: Secondary | ICD-10-CM | POA: Diagnosis not present

## 2016-05-01 DIAGNOSIS — I4891 Unspecified atrial fibrillation: Secondary | ICD-10-CM | POA: Diagnosis not present

## 2016-05-01 DIAGNOSIS — E1100 Type 2 diabetes mellitus with hyperosmolarity without nonketotic hyperglycemic-hyperosmolar coma (NKHHC): Secondary | ICD-10-CM | POA: Diagnosis not present

## 2016-05-01 DIAGNOSIS — E1143 Type 2 diabetes mellitus with diabetic autonomic (poly)neuropathy: Secondary | ICD-10-CM | POA: Diagnosis not present

## 2016-05-01 DIAGNOSIS — R278 Other lack of coordination: Secondary | ICD-10-CM | POA: Diagnosis not present

## 2016-05-01 DIAGNOSIS — Z9181 History of falling: Secondary | ICD-10-CM | POA: Diagnosis not present

## 2016-05-01 DIAGNOSIS — E118 Type 2 diabetes mellitus with unspecified complications: Secondary | ICD-10-CM | POA: Diagnosis not present

## 2016-05-01 DIAGNOSIS — I2510 Atherosclerotic heart disease of native coronary artery without angina pectoris: Secondary | ICD-10-CM | POA: Diagnosis not present

## 2016-05-01 DIAGNOSIS — S62101A Fracture of unspecified carpal bone, right wrist, initial encounter for closed fracture: Secondary | ICD-10-CM | POA: Diagnosis not present

## 2016-05-01 DIAGNOSIS — M6281 Muscle weakness (generalized): Secondary | ICD-10-CM | POA: Diagnosis not present

## 2016-05-01 DIAGNOSIS — R41841 Cognitive communication deficit: Secondary | ICD-10-CM | POA: Diagnosis not present

## 2016-05-01 LAB — BASIC METABOLIC PANEL
Anion gap: 8 (ref 5–15)
BUN: 11 mg/dL (ref 6–20)
CHLORIDE: 102 mmol/L (ref 101–111)
CO2: 26 mmol/L (ref 22–32)
Calcium: 9.3 mg/dL (ref 8.9–10.3)
Creatinine, Ser: 0.65 mg/dL (ref 0.44–1.00)
GFR calc Af Amer: >60 mL/min (ref >60)
GFR calc non Af Amer: >60 mL/min (ref >60)
GLUCOSE: 147 mg/dL — AB (ref 65–99)
POTASSIUM: 3.5 mmol/L (ref 3.5–5.1)
Sodium: 136 mmol/L (ref 135–145)

## 2016-05-01 LAB — CBC
HEMATOCRIT: 41.9 % (ref 36.0–46.0)
Hemoglobin: 13.9 g/dL (ref 12.0–15.0)
MCH: 30.8 pg (ref 26.0–34.0)
MCHC: 33.2 g/dL (ref 30.0–36.0)
MCV: 92.7 fL (ref 78.0–100.0)
PLATELETS: 236 10*3/uL (ref 150–400)
RBC: 4.52 MIL/uL (ref 3.87–5.11)
RDW: 14.9 % (ref 11.5–15.5)
WBC: 12.5 10*3/uL — ABNORMAL HIGH (ref 4.0–10.5)

## 2016-05-01 LAB — HEPARIN LEVEL (UNFRACTIONATED): Heparin Unfractionated: 0.16 IU/mL — ABNORMAL LOW (ref 0.30–0.70)

## 2016-05-01 LAB — GLUCOSE, CAPILLARY
GLUCOSE-CAPILLARY: 162 mg/dL — AB (ref 65–99)
Glucose-Capillary: 161 mg/dL — ABNORMAL HIGH (ref 65–99)

## 2016-05-01 LAB — PROTIME-INR
INR: 1.23
Prothrombin Time: 15.6 seconds — ABNORMAL HIGH (ref 11.4–15.2)

## 2016-05-01 MED ORDER — ENOXAPARIN SODIUM 60 MG/0.6ML ~~LOC~~ SOLN
60.0000 mg | Freq: Two times a day (BID) | SUBCUTANEOUS | Status: DC
  Administered 2016-05-01: 60 mg via SUBCUTANEOUS
  Filled 2016-05-01: qty 0.6

## 2016-05-01 MED ORDER — POLYETHYLENE GLYCOL 3350 17 G PO PACK: 17.0000 g | PACK | Freq: Every day | ORAL | 0 refills | Status: DC | PRN

## 2016-05-01 MED ORDER — WARFARIN SODIUM 5 MG PO TABS: 5.0000 mg | ORAL_TABLET | Freq: Once | ORAL | Status: DC

## 2016-05-01 MED ORDER — ENOXAPARIN SODIUM 60 MG/0.6ML ~~LOC~~ SOLN: 60.0000 mg | Freq: Two times a day (BID) | SUBCUTANEOUS | 0 refills | Status: DC

## 2016-05-01 MED ORDER — CLONAZEPAM 0.5 MG PO TABS: 0.2500 mg | ORAL_TABLET | Freq: Every day | ORAL | 0 refills | Status: DC

## 2016-05-01 MED ORDER — HYDROCODONE-ACETAMINOPHEN 5-325 MG PO TABS: 1.0000 | ORAL_TABLET | Freq: Four times a day (QID) | ORAL | 0 refills | Status: DC | PRN

## 2016-05-01 NOTE — Clinical Social Work Placement (Signed)
   CLINICAL SOCIAL WORK PLACEMENT  NOTE  Date:  05/01/2016  Patient Details  Name: Traci [name] MRN: [id number] Date of Birth: 22-Sep-1934  Clinical Social Work is seeking post-discharge placement for this patient at the [place] level of care (*CSW will initial, date and re-position this form in  chart as items are completed):  Yes   Patient/family provided with [place] Work Department's list of facilities offering this level of care within the geographic area requested by the patient (or if unable, by the patient's family).  Yes   Patient/family informed of their freedom to choose among providers that offer the needed level of care, that participate in Medicare, Medicaid or managed care program needed by the patient, have an available bed and are willing to accept the patient.  Yes   Patient/family informed of [facility]'s ownership interest in [facility] and [facility], as well as of the fact that they are under no obligation to receive care at these facilities.  PASRR submitted to EDS on 05/01/16     PASRR number received on 05/01/16     Existing PASRR number confirmed on       FL2 transmitted to all facilities in geographic area requested by pt/family on 05/01/16     FL2 transmitted to all facilities within larger geographic area on       Patient informed that his/her managed care company has contracts with or will negotiate with certain facilities, including the following:            Patient/family informed of bed offers received.  Patient [facility] bed at [facility]     Physician recommends and patient chooses bed at      Patient to be transferred to [facility] on 05/01/16.  Patient to be transferred to facility by family     Patient family notified on 05/01/16 of transfer.  Name of family member notified:  Kenney Houseman     PHYSICIAN Please sign FL2     Additional Comment:     _______________________________________________ Jorge Ny, LCSW 05/01/2016, 1:54 PM

## 2016-05-01 NOTE — Progress Notes (Signed)
qPhysical Therapy Treatment Patient Details Name: Traci [name] MRN: [id number] DOB: 25-May-1934 Today's Date: 05/01/2016    History of Present Illness Pt is a 82 y/o female s/p RUE ORIF secondary to R communited distal radius fx. PMH includes HTN, CAD, PVD, afib, pacemaker, DM, sick sinus syndrome, and history of falls.     PT Comments    Pt performed increased mobility, balance remains impaired with mobility and patient will continue to benefit from skilled rehab in a post acute setting.     Follow Up Recommendations  SNF;Supervision/Assistance - 24 hour     Equipment Recommendations  None recommended by PT    Recommendations for Other Services       Precautions / Restrictions Precautions Precautions: Fall Restrictions Weight Bearing Restrictions: Yes RUE Weight Bearing: Non weight bearing    Mobility  Bed Mobility Overal bed mobility: Needs Assistance Bed Mobility: Supine to Sit     Supine to sit: Mod assist     General bed mobility comments: Pt requires assist to elevate trunk into sitting edge of bed.  Pt able to follow commands to maintain RUE NWB.    Transfers Overall transfer level: Needs assistance Equipment used: 1 person hand held assist Transfers: Sit to/from Stand Sit to Stand: Min assist         General transfer comment: Min assist for steadying balance.  LOB posterior.  Requires HHA to steady.    Ambulation/Gait Ambulation/Gait assistance: Min assist Ambulation Distance (Feet): 68 Feet Assistive device: 1 person hand held assist Gait Pattern/deviations: Step-through pattern;Antalgic;Decreased stride length;Leaning posteriorly   Gait velocity interpretation: Below normal speed for age/gender General Gait Details: Pt with slow, unsteady gait requiring min A for steadying. Pt demonstrated x 2 LOB during gait and requried min A to prevent LOB. Distance limited by fatigue. Oxygen sats at 93% during & after gait training.    Stairs             Wheelchair Mobility    Modified Rankin (Stroke Patients Only)       Balance Overall balance assessment: Needs assistance Sitting-balance support: Feet supported;No upper extremity supported Sitting balance-Leahy Scale: Fair     Standing balance support: Single extremity supported;During functional activity Standing balance-Leahy Scale: Poor                              Cognition Arousal/Alertness: Awake/alert Behavior During Therapy: WFL for tasks assessed/performed Overall Cognitive Status: No family/caregiver present to determine baseline cognitive functioning Area of Impairment: Memory                     Memory: Decreased short-term memory         General Comments: Pt reports she has back pain but when asked do you have pain she denies pain.  Pt unable to locate her room attempting to enter another room.        Exercises      General Comments        Pertinent Vitals/Pain Pain Assessment: Faces (denies pain when asked but frequently reports her back is hurting.  ) Faces Pain Scale: Hurts even more Pain Location: with R digit gentle AROM Pain Descriptors / Indicators: Grimacing;Aching;Operative site guarding;Sore Pain Intervention(s): Limited activity within patient's tolerance    Home Living                      Prior Function  PT Goals (current goals can now be found in the care plan section) Acute Rehab PT Goals Patient Stated Goal: to go to rehab.   PT Goal Formulation: With patient Potential to Achieve Goals: Good Progress towards PT goals: Progressing toward goals    Frequency    Min 3X/week      PT Plan Current plan remains appropriate    Co-evaluation             End of Session Equipment Utilized During Treatment: Gait belt Activity Tolerance: Patient limited by fatigue Patient left: in bed;with call bell/phone within reach;with bed alarm set Nurse Communication: Mobility status;Other  (comment) PT Visit Diagnosis: Unsteadiness on feet (R26.81);History of falling (Z91.81);Pain Pain - Right/Left: Right Pain - part of body: Arm     Time: 1959-7471 PT Time Calculation (min) (ACUTE ONLY): 20 min  Charges:  $Gait Training: 8-22 mins                    G Codes:       Governor Rooks, PTA pager [phone]    Cristela Blue 05/01/2016, 2:46 PM

## 2016-05-01 NOTE — Discharge Summary (Signed)
Physician Discharge Summary  Traci [name] QBH:[id number] DOB: 15-Apr-1934 DOA: 04/29/2016  PCP: [name], MD  Admit date: 04/29/2016 Discharge date: 05/01/2016  Time spent: 45 minutes  Recommendations for Outpatient Follow-up:  Patient will be discharged to Skilled nursing facility. Continue physical and occupational therapy.   Patient will need to follow up with primary care provider within one week of discharge.  Follow up with Dr. Amedeo Plenty in 2 weeks.  Take Coumadin 5mg  on 4/5 and 4/6, then resume your coumadin dose schedule. Check INR on 05/05/2016.   Patient should continue medications as prescribed.   Patient should follow a heart healthy diet.   Discharge Diagnoses:  Right wrist comminuted fracture  Frequent falls  Atrial fibrillation with variable heart rate Essential Hypertension  CAD with history of CHF Diabetes mellitus type 2 Depression Left hand swelling  Discharge Condition: Stable  Diet recommendation: Heart healthy  Filed Weights   04/29/16 1705  Weight: 65.1 kg (143 lb 8.3 oz)    History of present illness:  On 04/29/2016 by [name], [place] Traci [name] a 82 y.o.femalewith medical history significant of atrial fibrillation,on Coumadin anti-coagulation therapy, coronary artery disease, PAD with right carotid endarterectomy, hypertension, hyperlipidemia, diabetes mellitus, mild dementia who presented to the emergency department today after she sustained a fall earlier this morning landing on her right hand and sustaining a right wrist fracture. Patient did not report any dizziness or lightheadedness, does not report palpitations or loss of consciousness, on right-sided weakness. She said that she just all of a sudden slowly weaned down and fell on her right side.  Patient lives alone, but every night she sleeps over at her daughter's house because she has been prone to falling lately. She sustained 3 falls within the last 2 months. First fall was in  January 2018 when she lacerated the vessel and fractured coccyx. During that admission she was confined to the ICU for 7 days. The family noted some gait instability and times patient has a look onher face like she is not present at the moment and stairs somewherein the air with glossy eyes.  She sustained another fall yesterday on her daughter's porch landing over the plastic chair which she crashed, but fortunately didn't injure herself at that time.  Hospital Course:  Right wrist comminuted fracture  -Secondary to fall -Orthopedics, Dr. Amedeo Plenty, consulted and appreciated -S/p ORIF right wrist fracture comminuted complex with DVR plate and screw construct -Drain pulled on 04/30/2016 -F/up with Dr. Amedeo Plenty in 2 weeks  Frequent falls  -concerning for fainting,versus TIA,versus ?seizure, versus cardiac arrhythmia with rapid heart rate -Patient has ICD and cannot have MRI -Currently no neurological deficits -Discussed with granddaughter EEG, at this point, likely not going to be helpful in diagnoses as patient currently at baseline.  -Continue to monitor -PT/OT recommended SNF  Atrial fibrillation with variable heart rate -Continue Toprol and Cardizem  -Coumadin held due to surgery, resumed 04/30/2016 -Will bridge with lovenox -CHADSVASC 6 (given gender, age, CHF, HTN, DM) -given frequent falls, should patient be on [facility]- discussed with daughter and granddaugther via phone. -Recommendations for coumadin: 5mg  4/5 and 4/6. On 4/7 start 2.5mg  daily except for W/F- 5mg  -Check INR on 05/05/2016  Essential Hypertension  -Continue metoprolol, losartan, cardizem  CAD with history of CHF -Currently no complaints of chest pain -Continue metoprolol, losartan  Diabetes mellitus type 2 -metformin held- continue upon discharge  Depression -Continue lexapro  Left hand swelling -Xray showed degenerative changes, no acute abnormalities -Per  patient currently improving -Keep arm  elevated  Consultants Orthopedic sugery  Procedures  ORIF right wrist fracture comminuted complex with DVR plate and screw construct   Discharge Exam: Vitals:   05/01/16 0001 05/01/16 0514  BP: 140/77 (!) 164/97  Pulse: 94 88  Resp:  18  Temp:  98.2 F (36.8 C)   Denies chest pain, shortness of breath, abdominal pain, nausea or vomiting, diarrhea constipation, current dizziness or headache. Feels left hand swelling has improved, has some right hand pain.  Exam  General: Well developed, well nourished, NAD, appears stated age  82: NCAT,mucous membranes moist.   Cardiovascular: S1 S2 auscultated, irregularly irregular  Respiratory: Clear to auscultation bilaterally   Abdomen: Soft, nontender, nondistended, + bowel sounds  Extremities: warm dry without cyanosis clubbing or edema. Right hand in dressing in place.  Neuro: AAOx3, nonfocal  Psych: Normal affect and demeanor, pleasant  Discharge Instructions Discharge Instructions    Discharge instructions    Complete by:  As directed    Patient will be discharged to Skilled nursing facility. Continue physical and occupational therapy.   Patient will need to follow up with primary care provider within one week of discharge.  Follow up with Dr. Amedeo Plenty in 2 weeks.  Do Not Drive. Take Coumadin 5mg  on 4/5 and 4/6, then resume your coumadin dose schedule. Check INR on 05/05/2016.   Patient should continue medications as prescribed.   Patient should follow a heart healthy diet.     Current Discharge Medication List    START taking these medications   Details  enoxaparin (LOVENOX) 60 MG/0.6ML injection Inject 0.6 mLs (60 mg total) into the skin every 12 (twelve) hours. Qty: 14 Syringe, Refills: 0    HYDROcodone-acetaminophen (NORCO/VICODIN) 5-325 MG tablet Take 1 tablet by mouth every 6 (six) hours as needed for moderate pain. Qty: 20 tablet, Refills: 0    polyethylene glycol (MIRALAX / GLYCOLAX) packet Take 17 g by  mouth daily as needed for mild constipation. Qty: 14 each, Refills: 0      CONTINUE these medications which have CHANGED   Details  clonazePAM (KLONOPIN) 0.5 MG tablet Take 0.5 tablets (0.25 mg total) by mouth at bedtime. Qty: 5 tablet, Refills: 0      CONTINUE these medications which have NOT CHANGED   Details  diltiazem (CARDIZEM CD) 240 MG 24 hr capsule Take 240 mg by mouth daily.    escitalopram (LEXAPRO) 10 MG tablet Take 20 mg by mouth at bedtime.     losartan (COZAAR) 50 MG tablet TAKE 1 TABLET(50 MG) BY MOUTH TWICE DAILY Qty: 180 tablet, Refills: 2    metFORMIN (GLUCOPHAGE) 500 MG tablet Take 500 mg by mouth daily with breakfast.     metoprolol tartrate (LOPRESSOR) 25 MG tablet Take 25 mg by mouth 2 (two) times daily.    warfarin (COUMADIN) 5 MG tablet Take 2.5-5 mg by mouth daily. Take 2.5 mg on Mon / Tue / Thurs / Sat / Sun Take 5 mg on Wed / Fri       Allergies  Allergen Reactions  . Morphine Nausea Only  . Penicillins Other (See Comments)    Has patient had a PCN reaction causing immediate rash, facial/tongue/throat swelling, SOB or lightheadedness with hypotension: NO Has patient had a PCN reaction causing severe rash involving mucus membranes or skin necrosis: no Has patient had a PCN reaction that required hospitalization: NO Has patient had a PCN reaction occurring within the last 10 years: NO If all of  the above answers are "NO", then may proceed with Cephalosporin use.    Follow-up Information    [name], MD. Schedule an appointment as soon as possible for a visit in 1 week(s).   Specialty:  Internal Medicine Why:  Hospital follow up Contact information: [place] [address] [phone]        Paulene Floor, MD. Schedule an appointment as soon as possible for a visit in 2 week(s).   Specialty:  Orthopedic Surgery Why:  Hospital follow up Contact information: [address] [place]  [address] [phone]            The results of significant diagnostics from this hospitalization (including imaging, microbiology, ancillary and laboratory) are listed below for reference.    Significant Diagnostic Studies: Dg Wrist Complete Left  Result Date: 04/30/2016 CLINICAL DATA:  Fall.  Pain. EXAM: LEFT WRIST - COMPLETE 3+ VIEW COMPARISON:  No recent prior . FINDINGS: Diffuse degenerative change. Degenerative changes are most prominent the first carpometacarpal joint. Subchondral cyst noted the base of the first carpal most likely degenerative. No evidence of fracture dislocation. Peripheral vascular calcification. IMPRESSION: 1. Diffuse degenerative change. Degenerative changes most prominent about the first carpometacarpal joint. No acute bony abnormality. 2. Peripheral vascular disease. Electronically Signed   By: Marcello Moores  Register   On: 04/30/2016 07:32   Dg Wrist Complete Right  Result Date: 04/29/2016 CLINICAL DATA:  Fall. EXAM: RIGHT WRIST - COMPLETE 3+ VIEW COMPARISON:  02/07/2016 . FINDINGS: Comminuted severely angulated fracture of the distal right radial metaphysis is noted. Diffuse degenerative change. Peripheral vascular calcification . IMPRESSION: 1. Comminuted severely angulated fracture of the distal radius noted. 2. Peripheral vascular disease . Electronically Signed   By: Marcello Moores  Register   On: 04/29/2016 07:35    Microbiology: Recent Results (from the past 240 hour(s))  Surgical pcr screen     Status: Abnormal   Collection Time: 04/29/16  6:17 PM  Result Value Ref Range Status   MRSA, PCR NEGATIVE NEGATIVE Final   Staphylococcus aureus POSITIVE (A) NEGATIVE Final    Comment:        The Xpert SA Assay (FDA approved for NASAL specimens in patients over 61 years of age), is one component of a comprehensive surveillance program.  Test performance has been validated by [facility] for patients greater than or equal to 75 year old. It is not intended to diagnose  infection nor to guide or monitor treatment.      Labs: Basic Metabolic Panel:  Recent Labs Lab 04/29/16 1030 04/30/16 0559 05/01/16 0459  NA 139 137 136  K 3.7 3.3* 3.5  CL 102 102 102  CO2 26 24 26   GLUCOSE 195* 155* 147*  BUN 15 16 11   CREATININE 0.84 0.83 0.65  CALCIUM 9.4 9.4 9.3   Liver Function Tests: No results for input(s): AST, ALT, ALKPHOS, BILITOT, PROT, ALBUMIN in the last 168 hours. No results for input(s): LIPASE, AMYLASE in the last 168 hours. No results for input(s): AMMONIA in the last 168 hours. CBC:  Recent Labs Lab 04/29/16 1030 04/30/16 0559 05/01/16 0459  WBC 16.3* 13.9* 12.5*  NEUTROABS 14.2*  --   --   HGB 14.5 13.4 13.9  HCT 43.5 40.3 41.9  MCV 92.6 93.1 92.7  PLT 257 225 236   Cardiac Enzymes: No results for input(s): CKTOTAL, CKMB, CKMBINDEX, TROPONINI in the last 168 hours. BNP: BNP (last 3 results) No results for input(s): BNP in the  last 8760 hours.  ProBNP (last 3 results) No results for input(s): PROBNP in the last 8760 hours.  CBG:  Recent Labs Lab 04/30/16 0029 04/30/16 0756 04/30/16 1159 04/30/16 1622 05/01/16 0802  GLUCAP 158* 150* 172* 200* 161*       Signed:  Cristal Ford  [facility] 05/01/2016, 12:16 PM

## 2016-05-01 NOTE — Clinical Social Work Note (Signed)
Clinical Social Work Assessment  Patient Details  Name: Traci [name] MRN: [id number] Date of Birth: Jul 20, 1934  Date of referral:  05/01/16               Reason for consult:  Facility Placement                Permission sought to share information with:  Chartered certified accountant granted to share information::  Yes, Verbal Permission Granted  Name::      Otila Back  Agency::  [facility]  Relationship::   daughter  Contact Information:     Housing/Transportation Living arrangements for the past 2 months:  Apartment Source of Information:  Patient Patient Interpreter Needed:  None Criminal Activity/Legal Involvement Pertinent to Current Situation/Hospitalization:  No - Comment as needed Significant Relationships:  Adult Children Lives with:  Self Do you feel safe going back to the place where you live?  No Need for family participation in patient care:  Yes (Comment)  Care giving concerns:  Patient resides home alone and has a history of falls.  She has impaired ambulation and is not safe to return home to independent living at this time.    Social Worker assessment / plan:  CSW met with patient to discuss recommendations from PT/OT for discharge plan.  Pt will need rehabilitation prior to returning home. CSW explained the SNF recommendation and the referral process.     Employment status:  Retired Nurse, adult, [place] PT Recommendations:  [place] / Referral to community resources:     Patient/Family's Response to care:  Patient agreeable  with SNF recommendations and reports no issues with care received.  Patient recommended CSW contact daughter and discuss SNF placement and referral with her directly.         Patient/Family's Understanding of and Emotional Response to Diagnosis, Current Treatment, and Prognosis:   Patient appears to have a good understanding of her health concerns and is  willing to engage in short term rehabilitation therapy.  Daughter became anxious and tearful when discussing DC plan and her concern that patient will be DC to SNF without all health concerns addressed.   Emotional Assessment Appearance:  Appears stated age Attitude/Demeanor/Rapport:   (cooperative) Affect (typically observed):  Accepting, Appropriate Orientation:  Oriented to Self, Oriented to Place, Oriented to  Time, Oriented to Situation Alcohol / Substance use:  Not Applicable Psych involvement (Current and /or in the community):  No (Comment)  Discharge Needs  Concerns to be addressed:  Discharge Planning Concerns Readmission within the last 30 days:  No Current discharge risk:  Physical Impairment Barriers to Discharge:  Continued Medical Work up   [employer], LCSW 05/01/2016, 1:30 PM

## 2016-05-01 NOTE — Discharge Instructions (Signed)
Fall Prevention in the Home Falls can cause injuries and can affect people from all age groups. There are many simple things that you can do to make your home safe and to help prevent falls. What can I do on the outside of my home?  Regularly repair the edges of walkways and driveways and fix any cracks.  Remove high doorway thresholds.  Trim any shrubbery on the main path into your home.  Use bright outdoor lighting.  Clear walkways of debris and clutter, including tools and rocks.  Regularly check that handrails are securely fastened and in good repair. Both sides of any steps should have handrails.  Install guardrails along the edges of any raised decks or porches.  Have leaves, snow, and ice cleared regularly.  Use sand or salt on walkways during winter months.  In the garage, clean up any spills right away, including grease or oil spills. What can I do in the bathroom?  Use night lights.  Install grab bars by the toilet and in the tub and shower. Do not use towel bars as grab bars.  Use non-skid mats or decals on the floor of the tub or shower.  If you need to sit down while you are in the shower, use a plastic, non-slip stool.  Keep the floor dry. Immediately clean up any water that spills on the floor.  Remove soap buildup in the tub or shower on a regular basis.  Attach bath mats securely with double-sided non-slip rug tape.  Remove throw rugs and other tripping hazards from the floor. What can I do in the bedroom?  Use night lights.  Make sure that a bedside light is easy to reach.  Do not use oversized bedding that drapes onto the floor.  Have a firm chair that has side arms to use for getting dressed.  Remove throw rugs and other tripping hazards from the floor. What can I do in the kitchen?  Clean up any spills right away.  Avoid walking on wet floors.  Place frequently used items in easy-to-reach places.  If you need to reach for something above  you, use a sturdy step stool that has a grab bar.  Keep electrical cables out of the way.  Do not use floor polish or wax that makes floors slippery. If you have to use wax, make sure that it is non-skid floor wax.  Remove throw rugs and other tripping hazards from the floor. What can I do in the stairways?  Do not leave any items on the stairs.  Make sure that there are handrails on both sides of the stairs. Fix handrails that are broken or loose. Make sure that handrails are as long as the stairways.  Check any carpeting to make sure that it is firmly attached to the stairs. Fix any carpet that is loose or worn.  Avoid having throw rugs at the top or bottom of stairways, or secure the rugs with carpet tape to prevent them from moving.  Make sure that you have a light switch at the top of the stairs and the bottom of the stairs. If you do not have them, have them installed. What are some other fall prevention tips?  Wear closed-toe shoes that fit well and support your feet. Wear shoes that have rubber soles or low heels.  When you use a stepladder, make sure that it is completely opened and that the sides are firmly locked. Have someone hold the ladder while you are using   it. Do not climb a closed stepladder.  Add color or contrast paint or tape to grab bars and handrails in your home. Place contrasting color strips on the first and last steps.  Use mobility aids as needed, such as canes, walkers, scooters, and crutches.  Turn on lights if it is dark. Replace any light bulbs that burn out.  Set up furniture so that there are clear paths. Keep the furniture in the same spot.  Fix any uneven floor surfaces.  Choose a carpet design that does not hide the edge of steps of a stairway.  Be aware of any and all pets.  Review your medicines with your healthcare provider. Some medicines can cause dizziness or changes in blood pressure, which increase your risk of falling. Talk with  your health care provider about other ways that you can decrease your risk of falls. This may include working with a physical therapist or trainer to improve your strength, balance, and endurance. This information is not intended to replace advice given to you by your health care provider. Make sure you discuss any questions you have with your health care provider. Document Released: 01/03/2002 Document Revised: 06/12/2015 Document Reviewed: 02/17/2014 Elsevier Interactive Patient Education  2017 Elsevier Inc.  

## 2016-05-01 NOTE — Progress Notes (Signed)
ANTICOAGULATION CONSULT NOTE - Follow Up Consult  Pharmacy Consult for Heparin>>Lovenox and Coumadin Indication: atrial fibrillation  Allergies  Allergen Reactions  . Morphine Nausea Only  . Penicillins Other (See Comments)    Has patient had a PCN reaction causing immediate rash, facial/tongue/throat swelling, SOB or lightheadedness with hypotension: NO Has patient had a PCN reaction causing severe rash involving mucus membranes or skin necrosis: no Has patient had a PCN reaction that required hospitalization: NO Has patient had a PCN reaction occurring within the last 10 years: NO If all of the above answers are "NO", then may proceed with Cephalosporin use.     Patient Measurements: Height: 5\' 5"  (165.1 cm) Weight: 143 lb 8.3 oz (65.1 kg) IBW/kg (Calculated) : 57 Heparin Dosing Weight: 65.1 kg  Vital Signs: Temp: 98.2 F (36.8 C) (04/05 0514) Temp Source: Oral (04/05 0514) BP: 164/97 (04/05 0514) Pulse Rate: 88 (04/05 0514)  Labs:  Recent Labs  04/29/16 1030 04/30/16 0559 05/01/16 0459  HGB 14.5 13.4 13.9  HCT 43.5 40.3 41.9  PLT 257 225 236  LABPROT 19.7* 17.4* 15.6*  INR 1.65 1.42 1.23  HEPARINUNFRC  --   --  0.16*  CREATININE 0.84 0.83 0.65    Estimated Creatinine Clearance: 51.3 mL/min (by C-G formula based on SCr of 0.65 mg/dL).   Medications:  Scheduled:  . clonazePAM  0.25 mg Oral QHS  . diltiazem  240 mg Oral Daily  . enoxaparin (LOVENOX) injection  60 mg Subcutaneous Q12H  . escitalopram  20 mg Oral QHS  . insulin aspart  0-9 Units Subcutaneous TID WC  . losartan  100 mg Oral Daily  . metoprolol tartrate  25 mg Oral BID  . sodium chloride flush  3 mL Intravenous Q12H  . vitamin C  1,000 mg Oral Daily  . warfarin  5 mg Oral ONCE-1800  . Warfarin - Pharmacist Dosing Inpatient   Does not apply q1800   Infusions:  . sodium chloride 50 mL/hr at 04/30/16 2034    Assessment: 82 yo F on Coumadin PTA for hx of afib.  Pt presented to ED after a  fall resulting in R wrist fracture.  Coumadin was held and patient was bridged for OR repair on 4/3.   Pt continues on heparin.  No bleeding reported overnight.  To transition to Lovenox today in preparation for discharge to SNF.  INR 1.23  Goal of Therapy:  INR 2-3 Anti-Xa level 0.6-1 units/ml 4hrs after LMWH dose given Monitor platelets by anticoagulation protocol: Yes   Plan:  Discontinue heparin and associated labs Lovenox 60mg  SQ q12h - first dose 1 hour after heparin discontinued. Coumadin 5mg  PO x 1 tonight at 8pm  If discharged: Would recommend Coumadin 5mg  on 4/5 and 4/6. On 4/7, restart PTA regimen of 2.5mg  daily except 5mg  on Weds/Fri. Continue Lovenox until INR check on Monday.   [employer], Pharm.D., BCPS Clinical Pharmacist Pager [phone] 05/01/2016 8:50 AM

## 2016-05-01 NOTE — Progress Notes (Signed)
Pt prepared for d/c to SNF. IV d/c'd. Skin intact except as charted in most recent assessments. Vitals are stable. Report called to receiving facility. Pt to be transported by daughter to [facility].

## 2016-05-01 NOTE — Progress Notes (Signed)
Patient will discharge to [facility] Anticipated discharge date: 4/5 Family notified: Apolonio Schneiders and [place] by family- plan for pick up around 3pm Report #: [phone]  [place] signing off.  Jorge Ny, LCSW Clinical Social Worker [phone]

## 2016-05-01 NOTE — Progress Notes (Signed)
Occupational Therapy Treatment Patient Details Name: Traci [name] MRN: [id number] DOB: Sep 16, 1934 Today's Date: 05/01/2016    History of present illness Pt is a 82 y/o female s/p RUE ORIF secondary to R communited distal radius fx. PMH includes HTN, CAD, PVD, afib, pacemaker, DM, sick sinus syndrome, and history of falls.    OT comments  Pt making progress toward OT goals today. Able to perform toilet transfer ambulating to bathroom with min assist and pt holding IV pole for support. Pt required min guard assist for grooming activity in standing and set up for grooming in sitting. Pt with short term memory deficits; repeating herself frequently throughout session, unsure of baseline. Continue to recommend SNF for follow up. Will continue to follow acutely.   Follow Up Recommendations  SNF;Supervision/Assistance - 24 hour    Equipment Recommendations  Other (comment) (TBD at next venue)    Recommendations for Other Services      Precautions / Restrictions Precautions Precautions: Fall Restrictions Weight Bearing Restrictions: Yes RUE Weight Bearing: Non weight bearing       Mobility Bed Mobility Overal bed mobility: Needs Assistance Bed Mobility: Supine to Sit     Supine to sit: Min assist     General bed mobility comments: Min hand held assist to boost up into sitting. Cues to maintain RUE NWB. Able to scoot self out to EOB with increased time.  Transfers Overall transfer level: Needs assistance Equipment used: 1 person hand held assist Transfers: Sit to/from Stand Sit to Stand: Min assist         General transfer comment: Min assist for steadying balance    Balance Overall balance assessment: Needs assistance Sitting-balance support: Feet supported;No upper extremity supported Sitting balance-Leahy Scale: Good     Standing balance support: Single extremity supported;During functional activity Standing balance-Leahy Scale: Fair                              ADL either performed or assessed with clinical judgement   ADL Overall ADL's : Needs assistance/impaired     Grooming: Min guard;Sitting;Standing;Set up;Wash/dry hands;Wash/dry face;Brushing hair Grooming Details (indicate cue type and reason): min guard for hand washing in standing. set up for other grooming tasks sitting in supported chair             Lower Body Dressing: Maximal assistance Lower Body Dressing Details (indicate cue type and reason): to doff/don sock Toilet Transfer: Minimal assistance;Ambulation;Regular Toilet;Grab bars (pt holding IV pole for LUE support)   Toileting- Clothing Manipulation and Hygiene: Supervision/safety;Sitting/lateral lean Toileting - Clothing Manipulation Details (indicate cue type and reason): for peri care     Functional mobility during ADLs: Minimal assistance (holding IV pole with LUE) General ADL Comments: Cues to maintain RUE NWB during functional activities. Discussed post acute rehab; pt unsure. Educated on edema management strategies-wiggling fingers and elevation. Propped RUE on pillow at end of session     Vision       Perception     Praxis      Cognition Arousal/Alertness: Awake/alert Behavior During Therapy: WFL for tasks assessed/performed Overall Cognitive Status: No family/caregiver present to determine baseline cognitive functioning Area of Impairment: Memory                     Memory: Decreased short-term memory         General Comments: Pt repeating self troughout session; "my back hurts" "I think its from laying  in that bed for so long" "can I have a comb?"        Exercises     Shoulder Instructions       General Comments      Pertinent Vitals/ Pain       Pain Assessment: No/denies pain  Home Living                                          Prior Functioning/Environment              Frequency  Min 2X/week        Progress Toward Goals  OT  Goals(current goals can now be found in the care plan section)  Progress towards OT goals: Progressing toward goals  Acute Rehab OT Goals Patient Stated Goal: to go home  OT Goal Formulation: With patient  Plan Discharge plan remains appropriate    Co-evaluation                 End of Session Equipment Utilized During Treatment: Gait belt  OT Visit Diagnosis: Muscle weakness (generalized) (M62.81);Unsteadiness on feet (R26.81)   Activity Tolerance Patient tolerated treatment well   Patient Left in chair;with call bell/phone within reach;with nursing/sitter in room   Nurse Communication          Time: [id number] OT Time Calculation (min): 14 min  Charges: OT General Charges $OT Visit: 1 Procedure OT Treatments $Self Care/Home Management : 8-22 mins  Carisma Troupe A. Ulice Brilliant, M.S., OTR/L Pager: [place] 05/01/2016, 12:20 PM

## 2016-05-01 NOTE — Progress Notes (Signed)
This is an acute visit.  Level care skilled.  Facility is [facility].  Chief complaint acute visit status post hospitalization for right wrist fracture surgically repaired.  History of present illness.  Patient is a very pleasant 82 year old female with a history of atrial fibrillation on chronic Coumadin-coronary artery disease-PAD with right carotid Endarterectomy -hypertension-hyperlipidemia as well as diabetes type 2 and mild dementia.  She presented to the ER after a fall in which she landed on her right handing sustained a right wrist fracture.  Apparently she did not report any dizziness or lightheadedness palpitations or loss of consciousness.  She does have some history of frequent falls.  Including a fall where she fractured her coccyx.  Apparently lacerated of S1 required ICU stay.  In regards to the right wrist fracture she is status post ORIF with placement of a plate and screw.  She will need follow-up by surgery.  Regards to her history of frequent falls-unclear etiology question fainting versus TIA seizure or cardiac arrhythmia.  She has an ICD and cannot have an MRI.  At this point recommendation was continue to monitor and do aggressive PT and OT and skilled nursing.   Regards to atrial fibrillation her Coumadin was held for surgery this has been restarted she is on a Lovenox bridge she continues on Toprol and Cardizem for rate control.  She does have a listed history CHF and coronary artery disease she is not complaining of any chest pain she has minimal edema over the currently she is on a beta blocker as well as Cozaar.  In regards diabetes type 2 she has been on Glucophage apparently this was held in the hospital recommendation to restart it now in skilled nursing we will need to monitor her CBGs.  Regards to dementia she is not currently on medication family says that she does not do well at all with Ativan-will write an order to make Ativan listed  as intolerance family states if she does have some anxiety and insomnia that they should be called and they can be here to help.  Past Medical History:  Diagnosis Date  . Arthritis   . Atrial fibrillation ([place])   . Carotid artery disease ([place])    Right carotid endarectomy 1999  . Chronic anticoagulation    Followed by PMD  . Coronary atherosclerosis    a. Minor at cardiac catheterization 2005 b. cath 10/16/2014 40% prox LAD dx, otherwise minimal CAD  . Depression   . Essential hypertension   . Glucose intolerance (impaired glucose tolerance)   . History of kidney stones   . History of pneumonia    2010  . Hyperlipidemia   . Sick sinus syndrome [facility]    [employer] PPM         Past Surgical History:  Procedure Laterality Date  . ABDOMINAL HYSTERECTOMY    . APPENDECTOMY    . BREAST BIOPSY Bilateral    x 7 total  . CARDIAC CATHETERIZATION N/A 10/16/2014   Procedure: Left Heart Cath and Coronary Angiography;  Surgeon: Leonie Man, MD;  Location: [facility];  Service: Cardiovascular;  Laterality: N/A;  . Carotid endarectomy Right    1999  . COLONOSCOPY     2006  . CYSTOSCOPY W/ URETERAL STENT PLACEMENT Left 04/19/2012   Procedure: CYSTOSCOPY WITH RETROGRADE PYELOGRAM/URETERAL STENT PLACEMENT ;  Surgeon: Ailene Rud, MD;  Location: [facility];  Service: Urology;  Laterality: Left;  . CYSTOSCOPY WITH RETROGRADE PYELOGRAM, URETEROSCOPY AND STENT PLACEMENT  Left 06/30/2012   Procedure: CYSTOSCOPY WITH LEFT  RETROGRADE PYELOGRAM, URETEROSCOPY  with basketing of stone, AND STENT PLACEMENT, TRANSURETHRAL UNROOFING OF URETER.;  Surgeon: Alexis Frock, MD;  Location: [facility];  Service: Urology;  Laterality: Left;  . EYE SURGERY Bilateral    Cataract extraction with IOL  . PACEMAKER PLACEMENT     2006    Social History        Social History  . Marital status: Married    Spouse name: N/A  . Number of children: N/A  . Years of  education: N/A      Occupational History  . Not on file.       Social History Main Topics  . Smoking status: Never Smoker  . Smokeless tobacco: Never Used  . Alcohol use No  . Drug use: No  . Sexual activity: No       Other Topics Concern  . Not on file      Social History Narrative  . No narrative on file         Allergies  Allergen Reactions  . Morphine Nausea Only  . Penicillins Other (See Comments)    Has patient had a PCN reaction causing immediate rash, facial/tongue/throat swelling, SOB or lightheadedness with hypotension: NO Has patient had a PCN reaction causing severe rash involving mucus membranes or skin necrosis: no Has patient had a PCN reaction that required hospitalization: NO Has patient had a PCN reaction occurring within the last 10 years: NO If all of the above answers are "NO", then may proceed with Cephalosporin use.           Family History  Problem Relation Age of Onset  . Heart attack Mother     Died age 48  . Hyperlipidemia Mother   . Diabetes Mother   . Coronary artery disease Father   . Alcohol abuse Father   . Hyperlipidemia Father   . Diabetes Sister   . Hypertension Sister   . Breast cancer Sister   . Breast cancer Sister      START taking these medications   Details  enoxaparin (LOVENOX) 60 MG/0.6ML injection Inject 0.6 mLs (60 mg total) into the skin every 12 (twelve) hours. Qty: 14 Syringe, Refills: 0    HYDROcodone-acetaminophen (NORCO/VICODIN) 5-325 MG tablet Take 1 tablet by mouth every 6 (six) hours as needed for moderate pain. Qty: 20 tablet, Refills: 0    polyethylene glycol (MIRALAX / GLYCOLAX) packet Take 17 g by mouth daily as needed for mild constipation. Qty: 14 each, Refills: 0         CONTINUE these medications which have CHANGED   Details  clonazePAM (KLONOPIN) 0.5 MG tablet Take 0.5 tablets (0.25 mg total) by mouth at bedtime. Qty: 5 tablet, Refills: 0           CONTINUE these medications which have NOT CHANGED   Details  diltiazem (CARDIZEM CD) 240 MG 24 hr capsule Take 240 mg by mouth daily.    escitalopram (LEXAPRO) 10 MG tablet Take 20 mg by mouth at bedtime.     losartan (COZAAR) 50 MG tablet TAKE 1 TABLET(50 MG) BY MOUTH TWICE DAILY Qty: 180 tablet, Refills: 2    metFORMIN (GLUCOPHAGE) 500 MG tablet Take 500 mg by mouth daily with breakfast.     metoprolol tartrate (LOPRESSOR) 25 MG tablet Take 25 mg by mouth 2 (two) times daily.    warfarin (COUMADIN) 5 MG tablet Take 2.5-5 mg by mouth daily. Take 2.5 mg  on Mon / Tue / Thurs / Sat / Sun Take 5 mg on Wed / Fri        Review of systems.  In general does not complain of any fever or chills.  Skin is rash or itching.  Head ears eyes nose mouth and throat not complaining of some visual changes or sore throat.  Respiratory denies shortness of breath or cough.  Cardiac denies chest pain has minimal pedal edema.  GI is not complaining of abdominal discomfort says she has good appetite does not complain of diarrhea or constipation.  GU is not complaining of dysuria.  Muscle skeletal does complain of some right hand wrist pain with movement-otherwise does not complain of significant pain.  Neurologic is not complaining of dizziness numbness or syncope.  Psych does have some history of insomnia has been receiving Klonopin apparently with good relief family says she does not tolerate Ativan well she does have daytime somnolence apparently when she takes the Ativan.  Physical exam.  Temperature is 98.1 pulse 94-respirations 20 blood pressure taken manually 168/80.  In general this is a pleasant elderly female in no distress sitting comfortably in her wheelchair. Right hilar.  Her skin is warm and dry.  Oropharynx is clear mucous membranes moist.  Eyes visual acuity appears grossly intact.  Heart is irregular irregular rate and rhythm without murmur gallop or rub she  has trace pedal edema.  Abdomen somewhat obese soft nontender with positive bowel sounds.  Musculoskeletal does move her right arm with good grip strength-also moves her lower extremities limited exam since she is in wheelchair currently.  She has a cast on her lower right arm there is some edema of her fingers touch sensation appears to be intact as well as capillary refill.  Neurologic is grossly intact her speech is clear no lateralizing findings.  Psych she is oriented to self she can name her home address-can tell me the year although it took a while-as well as month-  She is pleasant and appropriate although apparently does have some confusion with a diagnosis of mild dementia   Labs.  05/01/2016.  Sodium 136 potassium 3.5 BUN 11 creatinine 0.65.  WBC 12.5 hemoglobin 13.9 platelets 236  INR was 1.23.   Assessment and plan.  #1 history of right wrist fracture status post ORIF-at this point appears to be stable she will need followed by surgery. She does have an order for Vicodin every 6 hours as needed for pain this will have to be monitored he does have more pain with movement she states.  #2 history of atrial fibrillation at this point appears rate controlled-she is on diltiazem 240 mg a day as well as Lopressor 25 mg twice a day-she is on Lovenox bridge for anticoagulation her Coumadin has been restarted will check an INR tomorrow and monitor I suspect when INR is at 2 will discontinue the Lovenox she would like to be off the shots which is understandable.  #3 history hypertension-her systolic blood pressure is somewhat elevated today however she has just come from the hospital I suspect excitement may be causing some of this will continue to monitor her vital signs every shift I have spoken with nursing about rechecking the blood pressure bit later when she gets settled-she is on Cardizem 240 mg extended release-Lopressor 25 mg twice a day-as well as Cozaar 50 mg twice a  day.  #4 history diabetes type 2 for Glucophage has been restarted we will need to keep a close  eye on her blood sugars will monitor CBGs twice a day for now.  #5 depression this appears stable she is on Lexapro 20 mg by mouth daily.  #6 dementia this appears to be mild at this point will monitor and continue supportive care    #7 insomnia-apparently she has not done well with Ativan in the past-apparently she has tolerated the clonazepam 0.25 mg daily at bedtime per discussion with family at this point will monitor will list Ativan as an intolerance.  #8 I do note she has some mild hypokalemia at times in the hospital apparently this has been supplemented but will update a metabolic panel also will update her CBC tomorrow-I do note she has some mild leukocytosis which appears to be trending down.  9 history of coronary artery disease with history CHF-this apparently was not an issue in the hospital- did not complain of any chest pain shortness of breath has minimal pedal edema-at this point will monitor with weights Monday Wednesday Friday notify provider of gain greater than 3 pounds.       [id number] note greater than 40 minutes spent assessing patient-reviewing her chart-reviewing her labs discussing her status with family at bedside-and coordinating and   formulating a plan of care for numerous diagnoses-of note greater than 50% of time spent coordinating plan of care

## 2016-05-01 NOTE — NC FL2 (Signed)
[place] LEVEL OF CARE SCREENING TOOL     IDENTIFICATION  Patient Name: Traci [name] Birthdate: 14-Mar-1934 Sex: female Admission Date (Current Location): 04/29/2016  [facility] and [place] Number:  [employer] and Address:  [facility] [place]. [facility], [place] [address], [place], [place] [address]      Provider Number: [id number]  Attending Physician Name and Address:  Cristal Ford, DO  Relative Name and Phone Number:       Current Level of Care: Hospital Recommended Level of Care: [place] Prior Approval Number:    Date Approved/Denied:   PASRR Number:    Discharge Plan: SNF    Current Diagnoses: Patient Active Problem List   Diagnosis Date Noted  . Wrist fracture, right 04/29/2016  . Frequent falls 04/29/2016  . DM hyperosmolarity type II ([place]) 04/29/2016  . Diabetes mellitus with complication ([place])   . Physical deconditioning   . Fall 02/06/2016  . Acute blood loss anemia 02/06/2016  . Closed fracture of coccyx ([place]) 02/06/2016  . Female pelvic hematoma 02/02/2016  . Chest pain at rest 10/17/2014  . Chronic diastolic heart failure ([place]) 10/14/2014  . Peripheral neuropathy ([place]) 08/31/2013  . Leg swelling 08/31/2013  . Encounter for monitoring coumadin therapy 04/06/2013  . CAD (coronary artery disease) 04/06/2013  . OA (osteoarthritis) 04/06/2013  . Other malaise and fatigue 04/06/2013  . Essential hypertension, benign 04/06/2013  . Atrial fibrillation ([place]) 06/11/2010  . HYPERLIPIDEMIA 02/26/2010  . Cardiac pacemaker in situ 06/13/2009    Orientation RESPIRATION BLADDER Height & Weight     Self, Time, Situation, Place  Normal Continent Weight: 143 lb 8.3 oz (65.1 kg) Height:  5\' 5"  (165.1 cm)  BEHAVIORAL SYMPTOMS/MOOD NEUROLOGICAL BOWEL NUTRITION STATUS      Continent Diet (regular)  AMBULATORY STATUS COMMUNICATION OF NEEDS Skin   Limited Assist Verbally Surgical wounds                        Personal Care Assistance Level of Assistance  Bathing, Dressing Bathing Assistance: Limited assistance   Dressing Assistance: Limited assistance     Functional Limitations Info             SPECIAL CARE FACTORS FREQUENCY  PT (By licensed PT), OT (By licensed OT)     PT Frequency: 5/wk OT Frequency: 5/wk            Contractures      Additional Factors Info  Code Status, Allergies, Psychotropic, Insulin Sliding Scale Code Status Info: FULL Allergies Info: Morphine, Penicillins Psychotropic Info: klonopin Insulin Sliding Scale Info: 3/day       Current Medications (05/01/2016):  This is the current hospital active medication list Current Facility-Administered Medications  Medication Dose Route Frequency Provider Last Rate Last Dose  . 0.9 %  sodium chloride infusion   Intravenous Continuous Brenton Grills, PA-C 50 mL/hr at 04/30/16 2034    . acetaminophen (TYLENOL) tablet 650 mg  650 mg Oral Q6H PRN Brenton Grills, PA-C   650 mg at 04/30/16 2249   Or  . acetaminophen (TYLENOL) suppository 650 mg  650 mg Rectal Q6H PRN Brenton Grills, PA-C      . clonazePAM Bobbye [place]) tablet 0.25 mg  0.25 mg Oral QHS Brenton Grills, PA-C   0.25 mg at 04/30/16 2248  . diltiazem (CARDIZEM CD) 24 hr capsule 240 mg  240 mg Oral Daily Brenton Grills, PA-C   240 mg  at 05/01/16 0859  . enoxaparin (LOVENOX) injection 60 mg  60 mg Subcutaneous Q12H Kimberly B Hammons, RPH   60 mg at 05/01/16 0900  . escitalopram (LEXAPRO) tablet 20 mg  20 mg Oral QHS Brenton Grills, PA-C   20 mg at 04/30/16 2249  . hydrALAZINE (APRESOLINE) injection 10 mg  10 mg Intravenous Q6H PRN Maryann Mikhail, DO   10 mg at 05/01/16 0608  . HYDROcodone-acetaminophen (NORCO/VICODIN) 5-325 MG per tablet 1-2 tablet  1-2 tablet Oral Q4H PRN Brenton Grills, PA-C   1 tablet at 04/29/16 1559  . insulin aspart (novoLOG) injection 0-9 Units  0-9 Units Subcutaneous TID WC Brenton Grills, PA-C   2 Units  at 05/01/16 [phone]  . losartan (COZAAR) tablet 100 mg  100 mg Oral Daily Brenton Grills, PA-C   100 mg at 05/01/16 0859  . metoprolol tartrate (LOPRESSOR) tablet 25 mg  25 mg Oral BID Brenton Grills, PA-C   25 mg at 05/01/16 0900  . ondansetron (ZOFRAN) injection 4 mg  4 mg Intravenous Q6H PRN Roseanne Kaufman, MD      . ondansetron (ZOFRAN-ODT) disintegrating tablet 8 mg  8 mg Oral Q8H PRN Roseanne Kaufman, MD      . polyethylene glycol (MIRALAX / GLYCOLAX) packet 17 g  17 g Oral Daily PRN Brenton Grills, PA-C   17 g at 04/30/16 1324  . sodium chloride flush (NS) 0.9 % injection 3 mL  3 mL Intravenous Q12H Brenton Grills, PA-C   3 mL at 04/30/16 0840  . traZODone (DESYREL) tablet 50 mg  50 mg Oral QHS PRN Brenton Grills, PA-C      . vitamin C (ASCORBIC ACID) tablet 1,000 mg  1,000 mg Oral Daily Roseanne Kaufman, MD   1,000 mg at 05/01/16 0859  . warfarin (COUMADIN) tablet 5 mg  5 mg Oral ONCE-1800 Kimberly B Hammons, RPH      . Warfarin - Pharmacist Dosing Inpatient   Does not apply [place], [facility]         Discharge Medications: Please see discharge summary for a list of discharge medications.  Relevant Imaging Results:  Relevant Lab Results:   Additional Information SS#: [id number]  Jorge Ny, LCSW

## 2016-05-01 NOTE — Progress Notes (Signed)
ANTICOAGULATION CONSULT NOTE - Follow Up Consult  Pharmacy Consult for Heparin and Coumadin Indication: atrial fibrillation  Allergies  Allergen Reactions  . Morphine Nausea Only  . Penicillins Other (See Comments)    Has patient had a PCN reaction causing immediate rash, facial/tongue/throat swelling, SOB or lightheadedness with hypotension: NO Has patient had a PCN reaction causing severe rash involving mucus membranes or skin necrosis: no Has patient had a PCN reaction that required hospitalization: NO Has patient had a PCN reaction occurring within the last 10 years: NO If all of the above answers are "NO", then may proceed with Cephalosporin use.     Patient Measurements: Height: 5\' 5"  (165.1 cm) Weight: 143 lb 8.3 oz (65.1 kg) IBW/kg (Calculated) : 57 Heparin Dosing Weight: 65.1 kg  Vital Signs: Temp: 98.2 F (36.8 C) (04/05 0514) Temp Source: Oral (04/05 0514) BP: 164/97 (04/05 0514) Pulse Rate: 88 (04/05 0514)  Labs:  Recent Labs  04/29/16 1030 04/30/16 0559 05/01/16 0459  HGB 14.5 13.4  --   HCT 43.5 40.3  --   PLT 257 225  --   LABPROT 19.7* 17.4*  --   INR 1.65 1.42  --   HEPARINUNFRC  --   --  0.16*  CREATININE 0.84 0.83  --     Estimated Creatinine Clearance: 49.5 mL/min (by C-G formula based on SCr of 0.83 mg/dL).  Assessment: 82 yo F on Coumadin PTA for hx of afib.  Pt presented to ED after a fall resulting in R wrist fracture - s/p OR 4/3. Coumadin bridge to heparin. Heparin level subtherapeutic (0.16) on gtt at 1000 units/hr. No bleeding reported per RN. 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  Goal of Therapy:  INR 2-3 Heparin level 0.3-0.7 units/ml Monitor platelets by anticoagulation protocol: Yes   Plan:  Increase heparin to 1200 units/hr Will f/u 8 hr heparin level  Sherlon Handing, PharmD, BCPS Clinical pharmacist, pager [phone] 05/01/2016 5:46 AM

## 2016-05-02 ENCOUNTER — Encounter (HOSPITAL_COMMUNITY)
Admission: RE | Admit: 2016-05-02 | Discharge: 2016-05-02 | Disposition: A | Discharge status: Home / Self Care | Payer: PPO | Source: Skilled Nursing Facility | Attending: Internal Medicine | Admitting: Internal Medicine

## 2016-05-02 ENCOUNTER — Other Ambulatory Visit: Payer: Self-pay | Admitting: *Deleted

## 2016-05-02 DIAGNOSIS — I4891 Unspecified atrial fibrillation: Secondary | ICD-10-CM | POA: Insufficient documentation

## 2016-05-02 LAB — BASIC METABOLIC PANEL
Anion gap: 11 (ref 5–15)
BUN: 17 mg/dL (ref 6–20)
CHLORIDE: 99 mmol/L — AB (ref 101–111)
CO2: 26 mmol/L (ref 22–32)
CREATININE: 0.74 mg/dL (ref 0.44–1.00)
Calcium: 9.3 mg/dL (ref 8.9–10.3)
GFR calc Af Amer: >60 mL/min (ref >60)
GFR calc non Af Amer: >60 mL/min (ref >60)
GLUCOSE: 155 mg/dL — AB (ref 65–99)
Potassium: 3.6 mmol/L (ref 3.5–5.1)
SODIUM: 136 mmol/L (ref 135–145)

## 2016-05-02 LAB — CBC WITH DIFFERENTIAL/PLATELET
Basophils Absolute: 0.1 10*3/uL (ref 0.0–0.1)
Basophils Relative: 1 %
EOS ABS: 0.2 10*3/uL (ref 0.0–0.7)
EOS PCT: 2 %
HCT: 45.6 % (ref 36.0–46.0)
Hemoglobin: 15.1 g/dL — ABNORMAL HIGH (ref 12.0–15.0)
LYMPHS ABS: 2.6 10*3/uL (ref 0.7–4.0)
Lymphocytes Relative: 22 %
MCH: 31.3 pg (ref 26.0–34.0)
MCHC: 33.1 g/dL (ref 30.0–36.0)
MCV: 94.6 fL (ref 78.0–100.0)
MONO ABS: 1.1 10*3/uL — AB (ref 0.1–1.0)
MONOS PCT: 9 %
Neutro Abs: 8.2 10*3/uL — ABNORMAL HIGH (ref 1.7–7.7)
Neutrophils Relative %: 68 %
PLATELETS: 226 10*3/uL (ref 150–400)
RBC: 4.82 MIL/uL (ref 3.87–5.11)
RDW: 14.6 % (ref 11.5–15.5)
WBC: 12.1 10*3/uL — ABNORMAL HIGH (ref 4.0–10.5)

## 2016-05-02 LAB — PROTIME-INR
INR: 1.33
Prothrombin Time: 16.6 seconds — ABNORMAL HIGH (ref 11.4–15.2)

## 2016-05-02 MED ORDER — CLONAZEPAM 0.5 MG PO TABS: 0.2500 mg | ORAL_TABLET | Freq: Every day | ORAL | 0 refills | Status: DC

## 2016-05-02 NOTE — Telephone Encounter (Signed)
[facility] #[phone] Fax: [phone]   

## 2016-05-03 ENCOUNTER — Encounter (HOSPITAL_COMMUNITY)
Admission: RE | Admit: 2016-05-03 | Discharge: 2016-05-03 | Disposition: A | Discharge status: Home / Self Care | Payer: PPO

## 2016-05-03 LAB — PROTIME-INR
INR: 1.72
Prothrombin Time: 20.4 seconds — ABNORMAL HIGH (ref 11.4–15.2)

## 2016-05-05 ENCOUNTER — Non-Acute Institutional Stay (SKILLED_NURSING_FACILITY): Payer: PPO | Admitting: Internal Medicine

## 2016-05-05 ENCOUNTER — Encounter (HOSPITAL_COMMUNITY)
Admission: RE | Admit: 2016-05-05 | Discharge: 2016-05-05 | Disposition: A | Discharge status: Home / Self Care | Payer: PPO | Source: Skilled Nursing Facility | Attending: Pediatrics | Admitting: Pediatrics

## 2016-05-05 ENCOUNTER — Encounter: Payer: Self-pay | Admitting: Internal Medicine

## 2016-05-05 DIAGNOSIS — I482 Chronic atrial fibrillation, unspecified: Secondary | ICD-10-CM

## 2016-05-05 DIAGNOSIS — R296 Repeated falls: Secondary | ICD-10-CM

## 2016-05-05 DIAGNOSIS — S62101D Fracture of unspecified carpal bone, right wrist, subsequent encounter for fracture with routine healing: Secondary | ICD-10-CM | POA: Diagnosis not present

## 2016-05-05 DIAGNOSIS — I10 Essential (primary) hypertension: Secondary | ICD-10-CM

## 2016-05-05 DIAGNOSIS — F0390 Unspecified dementia without behavioral disturbance: Secondary | ICD-10-CM

## 2016-05-05 DIAGNOSIS — E118 Type 2 diabetes mellitus with unspecified complications: Secondary | ICD-10-CM | POA: Diagnosis not present

## 2016-05-05 DIAGNOSIS — I4891 Unspecified atrial fibrillation: Secondary | ICD-10-CM | POA: Insufficient documentation

## 2016-05-05 LAB — BASIC METABOLIC PANEL
ANION GAP: 8 (ref 5–15)
BUN: 15 mg/dL (ref 6–20)
CALCIUM: 9.8 mg/dL (ref 8.9–10.3)
CO2: 30 mmol/L (ref 22–32)
Chloride: 100 mmol/L — ABNORMAL LOW (ref 101–111)
Creatinine, Ser: 0.69 mg/dL (ref 0.44–1.00)
GFR calc Af Amer: >60 mL/min (ref >60)
GFR calc non Af Amer: >60 mL/min (ref >60)
GLUCOSE: 141 mg/dL — AB (ref 65–99)
Potassium: 4.8 mmol/L (ref 3.5–5.1)
SODIUM: 138 mmol/L (ref 135–145)

## 2016-05-05 LAB — CBC WITH DIFFERENTIAL/PLATELET
BASOS ABS: 0.1 10*3/uL (ref 0.0–0.1)
Basophils Relative: 1 %
EOS ABS: 0.3 10*3/uL (ref 0.0–0.7)
EOS PCT: 3 %
HCT: 44.6 % (ref 36.0–46.0)
HEMOGLOBIN: 14.7 g/dL (ref 12.0–15.0)
LYMPHS ABS: 2.5 10*3/uL (ref 0.7–4.0)
LYMPHS PCT: 26 %
MCH: 31.2 pg (ref 26.0–34.0)
MCHC: 33 g/dL (ref 30.0–36.0)
MCV: 94.7 fL (ref 78.0–100.0)
Monocytes Absolute: 0.9 10*3/uL (ref 0.1–1.0)
Monocytes Relative: 9 %
NEUTROS PCT: 61 %
Neutro Abs: 6.1 10*3/uL (ref 1.7–7.7)
PLATELETS: 314 10*3/uL (ref 150–400)
RBC: 4.71 MIL/uL (ref 3.87–5.11)
RDW: 14 % (ref 11.5–15.5)
WBC: 9.8 10*3/uL (ref 4.0–10.5)

## 2016-05-05 NOTE — Progress Notes (Deleted)
Provider:  Veleta Miners Location:   [facility] Room Number: 751/W Place of Service:  SNF (31)  PCP: [name], MD Patient Care Team: Celene Squibb, MD as PCP - General (Internal Medicine) Evans Lance, MD as Consulting Physician (Cardiology)  Extended Emergency Contact Information Primary Emergency Contact: Raylene Everts Address: [address] [place] [address] [place] Phone: [phone] Mobile Phone: [phone] Relation: Daughter Secondary Emergency Contact: Benjamine Mola [place] Mobile Phone: [phone] Relation: Grandaughter  Code Status: Full Code Goals of Care: Advanced Directive information Advanced Directives 05/05/2016  Does Patient Have a Medical Advance Directive? Yes  Type of Advance Directive (No Data)  Does patient want to make changes to medical advance directive? No - Patient declined  Copy of [place] in Chart? -  Would patient like information on creating a medical advance directive? No - Patient declined  Pre-existing out of facility DNR order (yellow form or pink MOST form) -      Chief Complaint  Patient presents with  . New Admit To SNF    HPI: Patient is a 82 y.o. female seen today for admission to  Past Medical History:  Diagnosis Date  . Arthritis   . Atrial fibrillation ([place])   . Carotid artery disease ([place])    Right carotid endarectomy 1999  . Chronic anticoagulation    Followed by PMD  . Coronary atherosclerosis    a. Minor at cardiac catheterization 2005 b. cath 10/16/2014 40% prox LAD dx, otherwise minimal CAD  . Depression   . Essential hypertension   . Glucose intolerance (impaired glucose tolerance)   . History of kidney stones   . Hyperlipidemia   . Pneumonia 2010  . Sick sinus syndrome [facility])    [employer] PPM   Past Surgical History:  Procedure Laterality Date  . ABDOMINAL HYSTERECTOMY    . APPENDECTOMY    . BREAST BIOPSY  Bilateral    x 7 total  . CARDIAC CATHETERIZATION N/A 10/16/2014   Procedure: Left Heart Cath and Coronary Angiography;  Surgeon: Leonie Man, MD;  Location: [facility];  Service: Cardiovascular;  Laterality: N/A;  . CAROTID ENDARTERECTOMY Right 1999  . CATARACT EXTRACTION W/ INTRAOCULAR LENS  IMPLANT, BILATERAL Bilateral   . COLONOSCOPY     2006  . CYSTOSCOPY W/ URETERAL STENT PLACEMENT Left 04/19/2012   Procedure: CYSTOSCOPY WITH RETROGRADE PYELOGRAM/URETERAL STENT PLACEMENT ;  Surgeon: Ailene Rud, MD;  Location[facility];  Service: Urology;  Laterality: Left;  . CYSTOSCOPY WITH RETROGRADE PYELOGRAM, URETEROSCOPY AND STENT PLACEMENT Left 06/30/2012   Procedure: CYSTOSCOPY WITH LEFT  RETROGRADE PYELOGRAM, URETEROSCOPY  with basketing of stone, AND STENT PLACEMENT, TRANSURETHRAL UNROOFING OF URETER.;  Surgeon: Alexis Frock, MD;  [facility];  Service: Urology;  Laterality: Left;  . INSERT / REPLACE / REMOVE PACEMAKER  2006  . OPEN REDUCTION INTERNAL FIXATION (ORIF) DISTAL RADIAL FRACTURE Right 04/29/2016   Procedure: OPEN REDUCTION INTERNAL FIXATION (ORIF) DISTAL RADIAL FRACTURE;  Surgeon: Roseanne Kaufman, MD;  Location: [facility];  Service: Orthopedics;  Laterality: Right;  . ORIF DISTAL RADIUS FRACTURE Right 04/29/2016    reports that she has never smoked. She has never used smokeless tobacco. She reports that she does not drink alcohol or use drugs. Social History   Social History  . Marital status: Widowed    Spouse name: N/A  . Number of children: N/A  .  Years of education: N/A   Occupational History  . Not on file.   Social History Main Topics  . Smoking status: Never Smoker  . Smokeless tobacco: Never Used  . Alcohol use No  . Drug use: No  . Sexual activity: No   Other Topics Concern  . Not on file   Social History Narrative  . No narrative on file    Functional Status Survey:    Family History  Problem Relation Age of Onset  . Heart attack  Mother     Died age 20  . Hyperlipidemia Mother   . Diabetes Mother   . Coronary artery disease Father   . Alcohol abuse Father   . Hyperlipidemia Father   . Diabetes Sister   . Hypertension Sister   . Breast cancer Sister   . Breast cancer Sister     Health Maintenance  Topic Date Due  . FOOT EXAM  09[birth year]10[birth year]1946  . OPHTHALMOLOGY EXAM  09[birth year]10[birth year]1946  . URINE MICROALBUMIN  09[birth year]10/1946  . TETANUS/TDAP  09/10/1955  . PNA vac Low Risk Adult (2 of 2 - PPSV23) 12/27/2016 (Originally 04/07/2014)  . HEMOGLOBIN A1C  08/02/2016  . INFLUENZA VACCINE  08/27/2016  . DEXA SCAN  Completed    Allergies  Allergen Reactions  . Morphine Nausea Only  . Penicillins Other (See Comments)    Has patient had a PCN reaction causing immediate rash, facial/tongue/throat swelling, SOB or lightheadedness with hypotension: NO Has patient had a PCN reaction causing severe rash involving mucus membranes or skin necrosis: no Has patient had a PCN reaction that required hospitalization: NO Has patient had a PCN reaction occurring within the last 10 years: NO If all of the above answers are "NO", then may proceed with Cephalosporin use.     Current Outpatient Prescriptions on File Prior to Visit  Medication Sig Dispense Refill  . diltiazem (CARDIZEM CD) 240 MG 24 hr capsule Take 240 mg by mouth daily.    Marland Kitchen enoxaparin (LOVENOX) 60 MG/0.6ML injection Inject 0.6 mLs (60 mg total) into the skin every 12 (twelve) hours. 14 Syringe 0  . escitalopram (LEXAPRO) 10 MG tablet Take 20 mg by mouth at bedtime.     Marland Kitchen HYDROcodone-acetaminophen (NORCO/VICODIN) 5-325 MG tablet Take 1 tablet by mouth every 6 (six) hours as needed for moderate pain. 20 tablet 0  . losartan (COZAAR) 50 MG tablet TAKE 1 TABLET(50 MG) BY MOUTH TWICE DAILY 180 tablet 2  . metFORMIN (GLUCOPHAGE) 500 MG tablet Take 500 mg by mouth daily with breakfast.     . metoprolol tartrate (LOPRESSOR) 25 MG tablet Take 25 mg by mouth 2 (two) times daily.    .  polyethylene glycol (MIRALAX / GLYCOLAX) packet Take 17 g by mouth daily as needed for mild constipation. 14 each 0  . warfarin (COUMADIN) 5 MG tablet Take 2.5-5 mg by mouth daily. Take 2.5 mg on Mon / Tue / Thurs / Sat / Sun Take 5 mg on Wed / Fri     No current facility-administered medications on file prior to visit.      Review of Systems  There were no vitals filed for this visit. There is no height or weight on file to calculate BMI. Physical Exam  Labs reviewed: Basic Metabolic Panel:  Recent Labs  05/01/16 0459 05/02/16 0615 05/05/16 0700  NA 136 136 138  K 3.5 3.6 4.8  CL 102 99* 100*  CO2 26 26 30   GLUCOSE 147* 155* 141*  BUN 11  17 15  CREATININE 0.65 0.74 0.69  CALCIUM 9.3 9.3 9.8   Liver Function Tests:  Recent Labs  02/02/16 1739 02/02/16 2301 02/03/16 0537  AST 21 20 21   ALT 15 15 14   ALKPHOS 56 50 45  BILITOT 0.8 0.8 1.0  PROT 7.6 6.8 6.0*  ALBUMIN 4.1 3.7 3.3*   No results for input(s): LIPASE, AMYLASE in the last 8760 hours. No results for input(s): AMMONIA in the last 8760 hours. CBC:  Recent Labs  04/29/16 1030  05/01/16 0459 05/02/16 0615 05/05/16 0700  WBC 16.3*  < > 12.5* 12.1* 9.8  NEUTROABS 14.2*  --   --  8.2* 6.1  HGB 14.5  < > 13.9 15.1* 14.7  HCT 43.5  < > 41.9 45.6 44.6  MCV 92.6  < > 92.7 94.6 94.7  PLT 257  < > 236 226 314  < > = values in this interval not displayed. Cardiac Enzymes: No results for input(s): CKTOTAL, CKMB, CKMBINDEX, TROPONINI in the last 8760 hours. BNP: Invalid input(s): POCBNP Lab Results  Component Value Date   HGBA1C 6.6 (H) 02/03/2016   Lab Results  Component Value Date   TSH 2.115 12/16/2013   Lab Results  Component Value Date   VITAMINB12 1,136 (H) 08/31/2013   No results found for: FOLATE No results found for: IRON, TIBC, FERRITIN  Imaging and Procedures obtained prior to SNF admission: No results found.  Assessment/Plan There are no diagnoses linked to this  encounter.   Family/ staff Communication:   Labs/tests ordered:

## 2016-05-05 NOTE — Progress Notes (Signed)
Provider: Veleta Miners  Location:   [facility] Room Number: 469/G Place of Service:  SNF (31)  PCP: [name], MD Patient Care Team: Celene Squibb, MD as PCP - General (Internal Medicine) Evans Lance, MD as Consulting Physician (Cardiology)  Extended Emergency Contact Information Primary Emergency Contact: Raylene Everts Address: [address], [place] [address] [place] Phone: [phone] Mobile Phone: [phone] Relation: Daughter Secondary Emergency Contact: Benjamine Mola [place] Mobile Phone: [phone] Relation: Grandaughter  Code Status: Full Code Goals of Care: Advanced Directive information Advanced Directives 05/05/2016  Does Patient Have a Medical Advance Directive? Yes  Type of Advance Directive (No Data)  Does patient want to make changes to medical advance directive? No - Patient declined  Copy of [place] in Chart? -  Would patient like information on creating a medical advance directive? No - Patient declined  Pre-existing out of facility DNR order (yellow form or pink MOST form) -      Chief Complaint  Patient presents with  . New Admit To SNF    HPI: Patient is a 82 y.o. female seen today for admission to SNF for therapy. She has H/O  Atrial fibrillation on Chronic Coumadin therapy, CAD, PAD with right carotid endarterectomy, hypertension, hyperlipidemia, diabetes mellitus Type 2 and Sick Sinus s/p  Psychologist, forensic.  Patient Was brought to the ED with Right Wrist Fracture after she sustained fall in her daughters house. Patient was admitted as she has had 3  falls in past 2 months. She says she did not have  dizziness or lightheadedness, palpitations or loss of consciousness. Family has also noticed that she is getting more confused overtime. Her daughter said that they have been noticing slow change in her for past few years. Patient lives alone but stays with her  Daughter at night. She drives short distances also.  Patient denies any pain in her wrist. Very confused about the events. Usually says no for everything.  Past Medical History:  Diagnosis Date  . Arthritis   . Atrial fibrillation ([place])   . Carotid artery disease ([place])    Right carotid endarectomy 1999  . Chronic anticoagulation    Followed by PMD  . Coronary atherosclerosis    a. Minor at cardiac catheterization 2005 b. cath 10/16/2014 40% prox LAD dx, otherwise minimal CAD  . Depression   . Essential hypertension   . Glucose intolerance (impaired glucose tolerance)   . History of kidney stones   . Hyperlipidemia   . Pneumonia 2010  . Sick sinus syndrome [facility])    [employer] PPM   Past Surgical History:  Procedure Laterality Date  . ABDOMINAL HYSTERECTOMY    . APPENDECTOMY    . BREAST BIOPSY Bilateral    x 7 total  . CARDIAC CATHETERIZATION N/A 10/16/2014   Procedure: Left Heart Cath and Coronary Angiography;  Surgeon: Leonie Man, MD;  Location: [facility];  Service: Cardiovascular;  Laterality: N/A;  . CAROTID ENDARTERECTOMY Right 1999  . CATARACT EXTRACTION W/ INTRAOCULAR LENS  IMPLANT, BILATERAL Bilateral   . COLONOSCOPY     2006  . CYSTOSCOPY W/ URETERAL STENT PLACEMENT Left 04/19/2012   Procedure: CYSTOSCOPY WITH RETROGRADE PYELOGRAM/URETERAL STENT PLACEMENT ;  Surgeon: Ailene Rud, MD;  [facility];  Service: Urology;  Laterality: Left;  . CYSTOSCOPY WITH RETROGRADE PYELOGRAM, URETEROSCOPY AND STENT PLACEMENT Left 06/30/2012   Procedure: CYSTOSCOPY  WITH LEFT  RETROGRADE PYELOGRAM, URETEROSCOPY  with basketing of stone, AND STENT PLACEMENT, TRANSURETHRAL UNROOFING OF URETER.;  Surgeon: Alexis Frock, MD;  [facility];  Service: Urology;  Laterality: Left;  . INSERT / REPLACE / REMOVE PACEMAKER  2006  . OPEN REDUCTION INTERNAL FIXATION (ORIF) DISTAL RADIAL FRACTURE Right 04/29/2016   Procedure: OPEN REDUCTION INTERNAL FIXATION (ORIF) DISTAL RADIAL  FRACTURE;  Surgeon: Roseanne Kaufman, MD;  Location: [place] [facility];  Service: Orthopedics;  Laterality: Right;  . ORIF DISTAL RADIUS FRACTURE Right 04/29/2016    reports that she has never smoked. She has never used smokeless tobacco. She reports that she does not drink alcohol or use drugs. Social History   Social History  . Marital status: Widowed    Spouse name: N/A  . Number of children: N/A  . Years of education: N/A   Occupational History  . Not on file.   Social History Main Topics  . Smoking status: Never Smoker  . Smokeless tobacco: Never Used  . Alcohol use No  . Drug use: No  . Sexual activity: No   Other Topics Concern  . Not on file   Social History Narrative  . No narrative on file    Functional Status Survey:    Family History  Problem Relation Age of Onset  . Heart attack Mother     Died age 56  . Hyperlipidemia Mother   . Diabetes Mother   . Coronary artery disease Father   . Alcohol abuse Father   . Hyperlipidemia Father   . Diabetes Sister   . Hypertension Sister   . Breast cancer Sister   . Breast cancer Sister     Health Maintenance  Topic Date Due  . FOOT EXAM  09[birth year]10[birth year]1946  . OPHTHALMOLOGY EXAM  09[birth year]10/1946  . URINE MICROALBUMIN  09[birth year]10/1946  . TETANUS/TDAP  09/10/1955  . PNA vac Low Risk Adult (2 of 2 - PPSV23) 12/27/2016 (Originally 04/07/2014)  . HEMOGLOBIN A1C  08/02/2016  . INFLUENZA VACCINE  08/27/2016  . DEXA SCAN  Completed    Allergies  Allergen Reactions  . Morphine Nausea Only  . Penicillins Other (See Comments)    Has patient had a PCN reaction causing immediate rash, facial/tongue/throat swelling, SOB or lightheadedness with hypotension: NO Has patient had a PCN reaction causing severe rash involving mucus membranes or skin necrosis: no Has patient had a PCN reaction that required hospitalization: NO Has patient had a PCN reaction occurring within the last 10 years: NO If all of the above answers are "NO", then may proceed with  Cephalosporin use.     Current Outpatient Prescriptions on File Prior to Visit  Medication Sig Dispense Refill  . diltiazem (CARDIZEM CD) 240 MG 24 hr capsule Take 240 mg by mouth daily.    Marland Kitchen enoxaparin (LOVENOX) 60 MG/0.6ML injection Inject 0.6 mLs (60 mg total) into the skin every 12 (twelve) hours. 14 Syringe 0  . escitalopram (LEXAPRO) 10 MG tablet Take 20 mg by mouth at bedtime.     Marland Kitchen HYDROcodone-acetaminophen (NORCO/VICODIN) 5-325 MG tablet Take 1 tablet by mouth every 6 (six) hours as needed for moderate pain. 20 tablet 0  . losartan (COZAAR) 50 MG tablet TAKE 1 TABLET(50 MG) BY MOUTH TWICE DAILY 180 tablet 2  . metFORMIN (GLUCOPHAGE) 500 MG tablet Take 500 mg by mouth daily with breakfast.     . metoprolol tartrate (LOPRESSOR) 25 MG tablet Take 25 mg by mouth 2 (two) times daily.    Marland Kitchen  polyethylene glycol (MIRALAX / GLYCOLAX) packet Take 17 g by mouth daily as needed for mild constipation. 14 each 0  . warfarin (COUMADIN) 5 MG tablet Take 2.5-5 mg by mouth daily. Take 2.5 mg on Mon / Tue / Thurs / Sat / Sun Take 5 mg on Wed / Fri     No current facility-administered medications on file prior to visit.     Review of Systems  Review of Systems  Constitutional: Negative for activity change, appetite change, chills, diaphoresis, fatigue and fever.  HENT: Negative for mouth sores, postnasal drip, rhinorrhea, sinus pain and sore throat.   Respiratory: Negative for apnea, cough, chest tightness, shortness of breath and wheezing.   Cardiovascular: Negative for chest pain, palpitations and leg swelling.  Gastrointestinal: Negative for abdominal distention, abdominal pain, constipation, diarrhea, nausea and vomiting.  Genitourinary: Negative for dysuria and frequency.  Musculoskeletal: Negative for arthralgias, joint swelling and myalgias.  Skin: Negative for rash.  Neurological: Negative for dizziness, syncope, weakness, light-headedness and numbness.  Psychiatric/Behavioral: Negative  for behavioral problems, confusion and sleep disturbance.     There were no vitals filed for this visit. There is no height or weight on file to calculate BMI. Physical Exam  Constitutional: She appears well-developed and well-nourished.  HENT:  Head: Normocephalic.  Mouth/Throat: Oropharynx is clear and moist.  Eyes: Pupils are equal, round, and reactive to light.  Neck: Neck supple. No thyromegaly present.  Cardiovascular: Normal rate and normal heart sounds.  An irregularly irregular rhythm present.  No murmur heard. Pulmonary/Chest: Effort normal and breath sounds normal. No respiratory distress. She has no wheezes. She has no rales.  Abdominal: Soft. Bowel sounds are normal. She exhibits no distension. There is no tenderness. There is no rebound.  Musculoskeletal: She exhibits no edema.  Neurological: She is alert.  Patient is oriented about few things but then confused if ask detailed question. No Focal deficit  Skin: Skin is warm and dry. No rash noted. No erythema. No pallor.  Psychiatric: She has a normal mood and affect. Her behavior is normal. Thought content normal.    Labs reviewed: Basic Metabolic Panel:  Recent Labs  05/01/16 0459 05/02/16 0615 05/05/16 0700  NA 136 136 138  K 3.5 3.6 4.8  CL 102 99* 100*  CO2 26 26 30   GLUCOSE 147* 155* 141*  BUN 11 17 15   CREATININE 0.65 0.74 0.69  CALCIUM 9.3 9.3 9.8   Liver Function Tests:  Recent Labs  02/02/16 1739 02/02/16 2301 02/03/16 0537  AST 21 20 21   ALT 15 15 14   ALKPHOS 56 50 45  BILITOT 0.8 0.8 1.0  PROT 7.6 6.8 6.0*  ALBUMIN 4.1 3.7 3.3*   No results for input(s): LIPASE, AMYLASE in the last 8760 hours. No results for input(s): AMMONIA in the last 8760 hours. CBC:  Recent Labs  04/29/16 1030  05/01/16 0459 05/02/16 0615 05/05/16 0700  WBC 16.3*  < > 12.5* 12.1* 9.8  NEUTROABS 14.2*  --   --  8.2* 6.1  HGB 14.5  < > 13.9 15.1* 14.7  HCT 43.5  < > 41.9 45.6 44.6  MCV 92.6  < > 92.7  94.6 94.7  PLT 257  < > 236 226 314  < > = values in this interval not displayed. Cardiac Enzymes: No results for input(s): CKTOTAL, CKMB, CKMBINDEX, TROPONINI in the last 8760 hours. BNP: Invalid input(s): POCBNP Lab Results  Component Value Date   HGBA1C 6.6 (H) 02/03/2016   Lab Results  Component Value Date   TSH 2.115 12/16/2013   Lab Results  Component Value Date   VITAMINB12 1,136 (H) 08/31/2013   No results found for: FOLATE No results found for: IRON, TIBC, FERRITIN  Imaging and Procedures obtained prior to SNF admission: No results found.  Assessment/Plan Right wrist fracture S/p ORIF right wrist fracture comminuted complex with DVR plate and screw construct  Patient  Doing well pain controlled. Will Discontinue Norco as family little concerned about confusion.Continue therapy and Pain control with Tylenol. Patient is walking with walker.  Diabetes mellitus  Her BS are running more then 200 usually in afternoon. Will increase her Metformin to BID.  Essential hypertension BP well controlled on Cardizem and Losartan  Chronic atrial fibrillation  Rate controlled. INR was 1.72 on 04/07 Will repeat INR tomorrow and discontinue Lovenox.  Frequent falls Cause was never found EEG was not done due to family Request. MRI not done as she has pacemaker. Will do CT scan of head to rule out any Acute or chronic process to explain Falls. Discontinue Klonopin and Norco. Also Discussed with Family about Coumadin use with her frequent falls. We will monitor her in facility   Dementia  D/W Daughter and therapy. Patient scored 25 out of 50 in Cognitive eval. Daughter has noticed worsening in past few years. Will get CT scan of head. Also start her on Aricept. Follow with Dr Nevada Crane her PCP. Patient would need 24 hour Supervision on discharge.     Family/ staff Communication:   Labs/tests ordered: Repeat INR, CT scan of Head  Total time spent in this patient care  encounter was 45_ minutes; greater than 50% of the visit spent counseling family and coordinating care for problems addressed at this encounter.

## 2016-05-06 ENCOUNTER — Encounter (HOSPITAL_COMMUNITY)
Admission: RE | Admit: 2016-05-06 | Discharge: 2016-05-06 | Disposition: A | Discharge status: Home / Self Care | Payer: PPO | Source: Skilled Nursing Facility | Attending: Internal Medicine | Admitting: Internal Medicine

## 2016-05-06 LAB — PROTIME-INR
INR: 1.38
Prothrombin Time: 17.1 seconds — ABNORMAL HIGH (ref 11.4–15.2)

## 2016-05-07 ENCOUNTER — Encounter (HOSPITAL_COMMUNITY)
Admission: RE | Admit: 2016-05-07 | Discharge: 2016-05-07 | Disposition: A | Discharge status: Home / Self Care | Payer: PPO | Source: Skilled Nursing Facility | Attending: Internal Medicine | Admitting: Internal Medicine

## 2016-05-07 LAB — PROTIME-INR
INR: 1.43
Prothrombin Time: 17.6 s — ABNORMAL HIGH (ref 11.4–15.2)

## 2016-05-09 ENCOUNTER — Encounter (HOSPITAL_COMMUNITY)
Admission: RE | Admit: 2016-05-09 | Discharge: 2016-05-09 | Disposition: A | Discharge status: Home / Self Care | Payer: PPO | Source: Skilled Nursing Facility | Attending: *Deleted | Admitting: *Deleted

## 2016-05-09 LAB — PROTIME-INR
INR: 1.92
PROTHROMBIN TIME: 22.2 s — AB (ref 11.4–15.2)

## 2016-05-10 ENCOUNTER — Encounter (HOSPITAL_COMMUNITY)
Admission: RE | Admit: 2016-05-10 | Discharge: 2016-05-10 | Disposition: A | Discharge status: Home / Self Care | Payer: PPO

## 2016-05-10 LAB — PROTIME-INR
INR: 1.92
PROTHROMBIN TIME: 22.2 s — AB (ref 11.4–15.2)

## 2016-05-12 ENCOUNTER — Encounter (HOSPITAL_COMMUNITY)
Admission: RE | Admit: 2016-05-12 | Discharge: 2016-05-12 | Disposition: A | Discharge status: Home / Self Care | Payer: PPO | Source: Skilled Nursing Facility | Attending: *Deleted | Admitting: *Deleted

## 2016-05-12 DIAGNOSIS — Z4789 Encounter for other orthopedic aftercare: Secondary | ICD-10-CM | POA: Diagnosis not present

## 2016-05-12 DIAGNOSIS — S52591D Other fractures of lower end of right radius, subsequent encounter for closed fracture with routine healing: Secondary | ICD-10-CM | POA: Diagnosis not present

## 2016-05-12 LAB — PROTIME-INR
INR: 2.46
Prothrombin Time: 27.2 seconds — ABNORMAL HIGH (ref 11.4–15.2)

## 2016-05-14 ENCOUNTER — Ambulatory Visit (HOSPITAL_COMMUNITY): Admit: 2016-05-14 | Payer: PPO

## 2016-05-14 ENCOUNTER — Encounter (HOSPITAL_COMMUNITY)
Admission: RE | Admit: 2016-05-14 | Discharge: 2016-05-14 | Disposition: A | Discharge status: Home / Self Care | Payer: PPO | Source: Skilled Nursing Facility | Attending: Internal Medicine | Admitting: Internal Medicine

## 2016-05-14 ENCOUNTER — Non-Acute Institutional Stay (SKILLED_NURSING_FACILITY): Payer: PPO | Admitting: Internal Medicine

## 2016-05-14 DIAGNOSIS — R140 Abdominal distension (gaseous): Secondary | ICD-10-CM

## 2016-05-14 DIAGNOSIS — I482 Chronic atrial fibrillation, unspecified: Secondary | ICD-10-CM

## 2016-05-14 DIAGNOSIS — R1900 Intra-abdominal and pelvic swelling, mass and lump, unspecified site: Secondary | ICD-10-CM | POA: Diagnosis not present

## 2016-05-14 DIAGNOSIS — Z5181 Encounter for therapeutic drug level monitoring: Secondary | ICD-10-CM | POA: Diagnosis not present

## 2016-05-14 DIAGNOSIS — Z7901 Long term (current) use of anticoagulants: Secondary | ICD-10-CM

## 2016-05-14 LAB — PROTIME-INR
INR: 2.42
PROTHROMBIN TIME: 26.8 s — AB (ref 11.4–15.2)

## 2016-05-14 NOTE — Progress Notes (Signed)
This is an acute visit.  Level care skilled.  Facility is [facility], operational complaint acute visit secondary to abdominal distention.-Also anticoagulation management  History of present illness.  Patient is a pleasant 82 year old female with a history of atrial fibrillation on chronic Coumadin most coronary artery disease--.  She also has a history of a pelvic hematoma back in January.  Most recently she fell and fractured her right wrist apparently she's had increased falls the past couple months.  Apparently she's been getting somewhat weaker over the past few years.  She underwent an ORIF for the fracture with plate and screw construct.  She is here for therapy.  She does have a history again of atrial fibrillation after surgery her Coumadin was restarted with Lovenox bridge Lovenox subsequently has been discontinued since INR has become therapeutic INR remains therapeutic at 2.4 today we will recheck this in a couple days to ensure stability.  Her daughter feels she is having some increased abdominal distention according to the patient she feels she's had some gradually increased distention since she arrived here.  She is not complaining of any abdominal pain appears to have a good appetiteate almost all her dinner tonight-she did have a bowel movement today as well. Past Medical History:  Diagnosis Date  . Arthritis   . Atrial fibrillation ([place])   . Carotid artery disease ([place])    Right carotid endarectomy 1999  . Chronic anticoagulation    Followed by PMD  . Coronary atherosclerosis    a. Minor at cardiac catheterization 2005 b. cath 10/16/2014 40% prox LAD dx, otherwise minimal CAD  . Depression   . Essential hypertension   . Glucose intolerance (impaired glucose tolerance)   . History of kidney stones   . Hyperlipidemia   . Pneumonia 2010  . Sick sinus syndrome [facility])    [employer] PPM        Past Surgical History:  Procedure Laterality Date    . ABDOMINAL HYSTERECTOMY    . APPENDECTOMY    . BREAST BIOPSY Bilateral    x 7 total  . CARDIAC CATHETERIZATION N/A 10/16/2014   Procedure: Left Heart Cath and Coronary Angiography;  Surgeon: Leonie Man, MD;  Location: [facility];  Service: Cardiovascular;  Laterality: N/A;  . CAROTID ENDARTERECTOMY Right 1999  . CATARACT EXTRACTION W/ INTRAOCULAR LENS  IMPLANT, BILATERAL Bilateral   . COLONOSCOPY     2006  . CYSTOSCOPY W/ URETERAL STENT PLACEMENT Left 04/19/2012   Procedure: CYSTOSCOPY WITH RETROGRADE PYELOGRAM/URETERAL STENT PLACEMENT ;  Surgeon: Ailene Rud, MD;  Location: [facility];  Service: Urology;  Laterality: Left;  . CYSTOSCOPY WITH RETROGRADE PYELOGRAM, URETEROSCOPY AND STENT PLACEMENT Left 06/30/2012   Procedure: CYSTOSCOPY WITH LEFT  RETROGRADE PYELOGRAM, URETEROSCOPY  with basketing of stone, AND STENT PLACEMENT, TRANSURETHRAL UNROOFING OF URETER.;  Surgeon: Alexis Frock, MD;  Location: [facility];  Service: Urology;  Laterality: Left;  . INSERT / REPLACE / REMOVE PACEMAKER  2006  . OPEN REDUCTION INTERNAL FIXATION (ORIF) DISTAL RADIAL FRACTURE Right 04/29/2016   Procedure: OPEN REDUCTION INTERNAL FIXATION (ORIF) DISTAL RADIAL FRACTURE;  Surgeon: Roseanne Kaufman, MD;  Location: [facility];  Service: Orthopedics;  Laterality: Right;  . ORIF DISTAL RADIUS FRACTURE Right 04/29/2016    reports that she has never smoked. She has never used smokeless tobacco. She reports that she does not drink alcohol or use drugs. Social History        Social History  . Marital status: Widowed  Spouse name: N/A  . Number of children: N/A  . Years of education: N/A   Occupational History  . Not on file.       Social History Main Topics  . Smoking status: Never Smoker  . Smokeless tobacco: Never Used  . Alcohol use No  . Drug use: No  . Sexual activity: No       Other Topics Concern  . Not on file      Social History Narrative  . No narrative on  file    Functional Status Survey:        Family History  Problem Relation Age of Onset  . Heart attack Mother     Died age 90  . Hyperlipidemia Mother   . Diabetes Mother   . Coronary artery disease Father   . Alcohol abuse Father   . Hyperlipidemia Father   . Diabetes Sister   . Hypertension Sister   . Breast cancer Sister   . Breast cancer Sister         Health Maintenance  Topic Date Due  . FOOT EXAM  09/10/1944  . OPHTHALMOLOGY EXAM  09/10/1944  . URINE MICROALBUMIN  [birth year]1946  . TETANUS/TDAP  09/10/1955  . PNA vac Low Risk Adult (2 of 2 - PPSV23) 12/27/2016 (Originally 04/07/2014)  . HEMOGLOBIN A1C  08/02/2016  . INFLUENZA VACCINE  08/27/2016  . DEXA SCAN  Completed         Allergies  Allergen Reactions  . Morphine Nausea Only  . Penicillins Other (See Comments)    Has patient had a PCN reaction causing immediate rash, facial/tongue/throat swelling, SOB or lightheadedness with hypotension: NO Has patient had a PCN reaction causing severe rash involving mucus membranes or skin necrosis: no Has patient had a PCN reaction that required hospitalization: NO Has patient had a PCN reaction occurring within the last 10 years: NO If all of the above answers are "NO", then may proceed with Cephalosporin use.           Current Outpatient Prescriptions on File Prior to Visit  Medication Sig Dispense Refill  . diltiazem (CARDIZEM CD) 240 MG 24 hr capsule Take 240 mg by mouth daily.    .       . escitalopram (LEXAPRO) 10 MG tablet Take 20 mg by mouth at bedtime.     Marland Kitchen     0  . losartan (COZAAR) 50 MG tablet TAKE 1 TABLET(50 MG) BY MOUTH TWICE DAILY 180 tablet 2  . metFORMIN (GLUCOPHAGE) 500 MG tablet Take 500 mg by mouth daily with breakfast.     . metoprolol tartrate (LOPRESSOR) 25 MG tablet Take 25 mg by mouth 2 (two) times daily.    . polyethylene glycol (MIRALAX / GLYCOLAX) packet Take 17 g by mouth daily as needed for mild  constipation. 14 each 0  . warfarin (COUMADIN) 5 MG tablet Take 2.5-5 mg by mouth daily. Take 2.5 mg on Mon / Tue / Thurs / Sat / Sun Take 5 mg on Wed / Fri      Review of systems.  In general she is not complaining fever or chills.  Skin does not complain of rashes itching or increased bruising.  Head ears eyes nose mouth and throat does not complain of sore throat or visual changes.  Respiratory denies shortness of breath or cough.  Cardiac does not complaining of chest pain does not appear to have significant lower extremity edema.  GI does not complain of abdominal discomfort  nausea or vomiting diarrhea constipation and apparently did have a bowel movement today.  Musculoskeletal does not at this point complain of joint pain hydrocodone was discontinued secondary to concerns of confusion she does again have the history of the wrist fracture surgically repaired.  Neurologic is not complaining of dizziness headache or numbness.  In psych apparently has had some confusion but does not complain of overt depression or anxiety ears to be comfortable pleasant during our encounter today  Physical exam.  Temperature is 97.5 pulse 75 respirations 18 blood pressure 118/62.  General this is a pleasant elderly female in no distress sitting comfortably in her wheelchair finishing her dinner.  Her skin is warm and dry I do not note any increased bruising or bleeding.  Eyes pupils appear reactive to light sclerae are clear.  Oropharynx is clear mucous membrane is moist.  Chest is clear to auscultation there is no labored breathing.  Heart is regular irregular rate and rhythm without murmur gallop or rub I do not really appreciate much lower extremity edema.  Abdomen is protuberant it is soft with active bowel sounds in all 4 quadrants-there is no significant tenderness to palpation.  Muscle skeletal does move all extremities 4 has her right lower upper extremity in a hard cast  fingers are opposed grip strength from I can tell appears to be intact although limited somewhat by the cast-- capillary refill is intact    Neurologic is grossly intact her speech is clear no lateralizing findings.  Psych she is grossly oriented but again confused with detailed questioning which appears to be the case previously been seen by Dr. Lyndel Safe.  Labs.  05/14/2016.  INR 2.42.  05/05/2016.  Sodium 138 potassium 4.8 BUN 15 creatinine 0.69.  WBC 9.8 hemoglobin 14.7 platelets 314.  Assessment plan.  #1 anticoagulation management with history of-AFIB-- INR is therapeutic we'll continue current Coumadin dose of 5 mg on Wednesdays and Fridays and 2.5 mg all other days she is no longer on Lovenox-will update an INR on Friday, April 20.  Her rate appears to be controlled with history of-relation she is on Cardizem as well as metoprolol.  #2 history of abdominal distention this appears to be somewhat progressive with patient and her daughter-I do not note any acute tenderness here there is no firmness or bruising of the abdomen-will start by obtaining an abdominal x-ray.  Also when an INR is drawn in 2 days will update her CBC as well as a CMP-again there is no nausea vomiting abdominal discomfort associated with this but her abdomen does have some distention again will do the initial x-ray with follow-up studies to be determined pending those results.  [id number]

## 2016-05-15 ENCOUNTER — Encounter: Payer: Self-pay | Admitting: Internal Medicine

## 2016-05-16 ENCOUNTER — Encounter (HOSPITAL_COMMUNITY)
Admission: RE | Admit: 2016-05-16 | Discharge: 2016-05-16 | Disposition: A | Discharge status: Home / Self Care | Payer: PPO | Source: Skilled Nursing Facility | Attending: Internal Medicine | Admitting: Internal Medicine

## 2016-05-16 LAB — CBC WITH DIFFERENTIAL/PLATELET
Basophils Absolute: 0.1 10*3/uL (ref 0.0–0.1)
Basophils Relative: 1 %
EOS ABS: 0.2 10*3/uL (ref 0.0–0.7)
EOS PCT: 2 %
HCT: 42.8 % (ref 36.0–46.0)
Hemoglobin: 14.2 g/dL (ref 12.0–15.0)
LYMPHS PCT: 26 %
Lymphs Abs: 2.5 10*3/uL (ref 0.7–4.0)
MCH: 30.9 pg (ref 26.0–34.0)
MCHC: 33.2 g/dL (ref 30.0–36.0)
MCV: 93.2 fL (ref 78.0–100.0)
MONO ABS: 0.8 10*3/uL (ref 0.1–1.0)
MONOS PCT: 8 %
Neutro Abs: 6.1 10*3/uL (ref 1.7–7.7)
Neutrophils Relative %: 63 %
PLATELETS: 367 10*3/uL (ref 150–400)
RBC: 4.59 MIL/uL (ref 3.87–5.11)
RDW: 14.1 % (ref 11.5–15.5)
WBC: 9.7 10*3/uL (ref 4.0–10.5)

## 2016-05-16 LAB — COMPREHENSIVE METABOLIC PANEL
ALT: 16 U/L (ref 14–54)
ANION GAP: 9 (ref 5–15)
AST: 16 U/L (ref 15–41)
Albumin: 3.3 g/dL — ABNORMAL LOW (ref 3.5–5.0)
Alkaline Phosphatase: 87 U/L (ref 38–126)
BUN: 21 mg/dL — ABNORMAL HIGH (ref 6–20)
CHLORIDE: 100 mmol/L — AB (ref 101–111)
CO2: 28 mmol/L (ref 22–32)
CREATININE: 0.78 mg/dL (ref 0.44–1.00)
Calcium: 9.6 mg/dL (ref 8.9–10.3)
Glucose, Bld: 113 mg/dL — ABNORMAL HIGH (ref 65–99)
Potassium: 3.9 mmol/L (ref 3.5–5.1)
Sodium: 137 mmol/L (ref 135–145)
Total Bilirubin: 0.5 mg/dL (ref 0.3–1.2)
Total Protein: 6.7 g/dL (ref 6.5–8.1)

## 2016-05-16 LAB — PROTIME-INR
INR: 2.06
Prothrombin Time: 23.5 seconds — ABNORMAL HIGH (ref 11.4–15.2)

## 2016-05-19 ENCOUNTER — Encounter (HOSPITAL_COMMUNITY)
Admission: RE | Admit: 2016-05-19 | Discharge: 2016-05-19 | Disposition: A | Discharge status: Home / Self Care | Payer: PPO | Source: Skilled Nursing Facility | Attending: Internal Medicine | Admitting: Internal Medicine

## 2016-05-19 LAB — PROTIME-INR
INR: 2.38
Prothrombin Time: 26.4 seconds — ABNORMAL HIGH (ref 11.4–15.2)

## 2016-05-20 ENCOUNTER — Ambulatory Visit (HOSPITAL_COMMUNITY)
Admission: RE | Admit: 2016-05-20 | Discharge: 2016-05-20 | Disposition: A | Discharge status: Home / Self Care | Payer: PPO | Source: Ambulatory Visit | Attending: Internal Medicine | Admitting: Internal Medicine

## 2016-05-20 DIAGNOSIS — I639 Cerebral infarction, unspecified: Secondary | ICD-10-CM | POA: Diagnosis not present

## 2016-05-20 DIAGNOSIS — J329 Chronic sinusitis, unspecified: Secondary | ICD-10-CM | POA: Insufficient documentation

## 2016-05-20 DIAGNOSIS — I6782 Cerebral ischemia: Secondary | ICD-10-CM | POA: Diagnosis not present

## 2016-05-20 DIAGNOSIS — D320 Benign neoplasm of cerebral meninges: Secondary | ICD-10-CM | POA: Diagnosis not present

## 2016-05-20 MED ORDER — IOPAMIDOL (ISOVUE-300) INJECTION 61%
75.0000 mL | Freq: Once | INTRAVENOUS | Status: AC | PRN
Start: 2016-05-20 — End: 2016-05-20
  Administered 2016-05-20: 75 mL via INTRAVENOUS

## 2016-05-22 ENCOUNTER — Encounter: Payer: Self-pay | Admitting: Internal Medicine

## 2016-05-22 ENCOUNTER — Non-Acute Institutional Stay (SKILLED_NURSING_FACILITY): Payer: PPO | Admitting: Internal Medicine

## 2016-05-22 DIAGNOSIS — I10 Essential (primary) hypertension: Secondary | ICD-10-CM | POA: Diagnosis not present

## 2016-05-22 DIAGNOSIS — E118 Type 2 diabetes mellitus with unspecified complications: Secondary | ICD-10-CM | POA: Diagnosis not present

## 2016-05-22 DIAGNOSIS — I482 Chronic atrial fibrillation, unspecified: Secondary | ICD-10-CM

## 2016-05-22 DIAGNOSIS — F0390 Unspecified dementia without behavioral disturbance: Secondary | ICD-10-CM | POA: Diagnosis not present

## 2016-05-22 DIAGNOSIS — S62101A Fracture of unspecified carpal bone, right wrist, initial encounter for closed fracture: Secondary | ICD-10-CM | POA: Diagnosis not present

## 2016-05-22 NOTE — Progress Notes (Signed)
Location:   [place] Room Number: 998/P Place of Service:  SNF (31)  Provider: Aziya Arena,Amiley Shishido  PCP: [name], MD Patient Care Team: Celene Squibb, MD as PCP - General (Internal Medicine) Evans Lance, MD as Consulting Physician (Cardiology)  Extended Emergency Contact Information Primary Emergency Contact: Raylene Everts Address: [address], [place] [address] [place] Phone: [phone] Mobile Phone: [phone] Relation: Daughter Secondary Emergency Contact: Benjamine Mola [place] Phone: [phone] Relation: Grandaughter  Code Status: DNR Goals of care:  Advanced Directive information Advanced Directives 05/22/2016  Does Patient Have a Medical Advance Directive? Yes  Type of Advance Directive Out of facility DNR (pink MOST or yellow form)  Does patient want to make changes to medical advance directive? No - Patient declined  Copy of [place] in Chart? -  Would patient like information on creating a medical advance directive? No - Patient declined  Pre-existing out of facility DNR order (yellow form or pink MOST form) -     Allergies  Allergen Reactions  . Morphine Nausea Only  . Penicillins Other (See Comments)    Has patient had a PCN reaction causing immediate rash, facial/tongue/throat swelling, SOB or lightheadedness with hypotension: NO Has patient had a PCN reaction causing severe rash involving mucus membranes or skin necrosis: no Has patient had a PCN reaction that required hospitalization: NO Has patient had a PCN reaction occurring within the last 10 years: NO If all of the above answers are "NO", then may proceed with Cephalosporin use.     Chief Complaint  Patient presents with  . Discharge Note    HPI:  82 y.o. female  seen today for discharge from facility-.  She is here for rehabilitation after sustaining a wrist fracture on the right she has  received PT and OT and would benefit from continued PT and OT at home she also will need orthopedic follow-up.  She also has a history of atrial fibrillation on chronic Coumadin and INR has been therapeutic during her stay here most recently 2.38 we will update this before discharge this will be followed by home health as well rate is controlled on Lopressor. As well as Cardizem  She does have a history diabetes type 2 this is stable on Glucophage with blood sugars in the 90s to mid 100s largely   She also has a history of hypertension she is on losartan 50 mg twice a day Lopressor 25 mg twice a day as well as Cardizem 140 mg extended release--recent blood pressures have had systolics in the 1 3419 range and she is about to go home would be hasn't to change medications will need follow-up by primary care provider however.  She's also been started recently on Aricept secondary to concerns of developing been checked she did have a CT scan of her head which only showed chronic changes  Currently she has no complaints she is looking forward to going home she will be home with a daughter who is very supportive.  She will need nursing support for her multiple medical issues.      Past Medical History:  Diagnosis Date  . Arthritis   . Atrial fibrillation ([place])   . Carotid artery disease ([place])    Right carotid endarectomy 1999  . Chronic anticoagulation    Followed by PMD  . Coronary atherosclerosis    a. Minor at cardiac catheterization  2005 b. cath 10/16/2014 40% prox LAD dx, otherwise minimal CAD  . Depression   . Essential hypertension   . Glucose intolerance (impaired glucose tolerance)   . History of kidney stones   . Hyperlipidemia   . Pneumonia 2010  . Sick sinus syndrome [facility])    Medtronic PPM    Past Surgical History:  Procedure Laterality Date  . ABDOMINAL HYSTERECTOMY    . APPENDECTOMY    . BREAST BIOPSY Bilateral    x 7 total  . CARDIAC CATHETERIZATION N/A 10/16/2014    Procedure: Left Heart Cath and Coronary Angiography;  Surgeon: Leonie Man, MD;  Location: [place] CV LAB;  Service: Cardiovascular;  Laterality: N/A;  . CAROTID ENDARTERECTOMY Right 1999  . CATARACT EXTRACTION W/ INTRAOCULAR LENS  IMPLANT, BILATERAL Bilateral   . COLONOSCOPY     2006  . CYSTOSCOPY W/ URETERAL STENT PLACEMENT Left 04/19/2012   Procedure: CYSTOSCOPY WITH RETROGRADE PYELOGRAM/URETERAL STENT PLACEMENT ;  Surgeon: Ailene Rud, MD;  [facility];  Service: Urology;  Laterality: Left;  . CYSTOSCOPY WITH RETROGRADE PYELOGRAM, URETEROSCOPY AND STENT PLACEMENT Left 06/30/2012   Procedure: CYSTOSCOPY WITH LEFT  RETROGRADE PYELOGRAM, URETEROSCOPY  with basketing of stone, AND STENT PLACEMENT, TRANSURETHRAL UNROOFING OF URETER.;  Surgeon: Alexis Frock, MD;  [facility];  Service: Urology;  Laterality: Left;  . INSERT / REPLACE / REMOVE PACEMAKER  2006  . OPEN REDUCTION INTERNAL FIXATION (ORIF) DISTAL RADIAL FRACTURE Right 04/29/2016   Procedure: OPEN REDUCTION INTERNAL FIXATION (ORIF) DISTAL RADIAL FRACTURE;  Surgeon: Roseanne Kaufman, MD;  Location: [place];  Service: Orthopedics;  Laterality: Right;  . ORIF DISTAL RADIUS FRACTURE Right 04/29/2016      reports that she has never smoked. She has never used smokeless tobacco. She reports that she does not drink alcohol or use drugs. Social History   Social History  . Marital status: Widowed    Spouse name: N/A  . Number of children: N/A  . Years of education: N/A   Occupational History  . Not on file.   Social History Main Topics  . Smoking status: Never Smoker  . Smokeless tobacco: Never Used  . Alcohol use No  . Drug use: No  . Sexual activity: No   Other Topics Concern  . Not on file   Social History Narrative  . No narrative on file   Functional Status Survey:    Allergies  Allergen Reactions  . Morphine Nausea Only  . Penicillins Other (See Comments)    Has patient had a PCN reaction causing  immediate rash, facial/tongue/throat swelling, SOB or lightheadedness with hypotension: NO Has patient had a PCN reaction causing severe rash involving mucus membranes or skin necrosis: no Has patient had a PCN reaction that required hospitalization: NO Has patient had a PCN reaction occurring within the last 10 years: NO If all of the above answers are "NO", then may proceed with Cephalosporin use.     Pertinent  Health Maintenance Due  Topic Date Due  . FOOT EXAM  09/10/1944  . OPHTHALMOLOGY EXAM  09/10/1944  . URINE MICROALBUMIN  09/10/1944  . PNA vac Low Risk Adult (2 of 2 - PPSV23) 12/27/2016 (Originally 04/07/2014)  . HEMOGLOBIN A1C  08/02/2016  . INFLUENZA VACCINE  08/27/2016  . DEXA SCAN  Completed    Medications: Outpatient Encounter Prescriptions as of 05/22/2016  Medication Sig  . diltiazem (CARDIZEM CD) 240 MG 24 hr capsule Take 240 mg by mouth daily.  Marland Kitchen donepezil (ARICEPT)  5 MG tablet Take 5 mg by mouth at bedtime.  Marland Kitchen escitalopram (LEXAPRO) 10 MG tablet Take 20 mg by mouth at bedtime.   Marland Kitchen losartan (COZAAR) 50 MG tablet TAKE 1 TABLET(50 MG) BY MOUTH TWICE DAILY  . metFORMIN (GLUCOPHAGE) 500 MG tablet Take 500 mg by mouth 2 (two) times daily with a meal.   . metoprolol tartrate (LOPRESSOR) 25 MG tablet Take 25 mg by mouth 2 (two) times daily.  . polyethylene glycol (MIRALAX / GLYCOLAX) packet Take 17 g by mouth daily as needed for mild constipation.  Marland Kitchen warfarin (COUMADIN) 5 MG tablet Take 2.5-5 mg by mouth daily. Take 2.5 mg on Mon / Tue / Thurs / Sat / Sun Take 5 mg on Wed / Fri  . [DISCONTINUED] clonazePAM (KLONOPIN) 0.5 MG tablet Take 0.25 mg by mouth at bedtime.  . [DISCONTINUED] enoxaparin (LOVENOX) 60 MG/0.6ML injection Inject 0.6 mLs (60 mg total) into the skin every 12 (twelve) hours.  . [DISCONTINUED] HYDROcodone-acetaminophen (NORCO/VICODIN) 5-325 MG tablet Take 1 tablet by mouth every 6 (six) hours as needed for moderate pain.   No facility-administered  encounter medications on file as of 05/22/2016.      Review of Systems In general she is not complaining fever or chills.  Skin does not complain of rashes itching or increased bruising.  Head ears eyes nose mouth and throat does not complain of sore throat or visual changes.  Respiratory denies shortness of breath or cough.  Cardiac does not complaining of chest pain does not appear to have significant lower extremity edema.  GI does not complain of abdominal discomfort nausea or vomiting diarrhea constipation and apparently did have a bowel movement today.  Musculoskeletal does not at this point complain of joint pain hydrocodone was discontinued secondary to concerns of confusion she does again have the history of the wrist fracture surgically repaired.  Neurologic is not complaining of dizziness headache or numbness.  In psych apparently has had some confusion but does not complain of overt depression or anxiety ears to be comfortable pleasant during our encounter today      Temperature is 97.6 pulse 76 respirations 18 blood pressures recently 158/85-146/83-127/72 in this range weight appears to be stable at 152 pounds Physical Exam General this is a pleasant elderly female in no distress sitting comfortably in her wheelchair .  Her skin is warm and dry I do not note any increased bruising or bleeding.  Eyes pupils appear reactive to light sclerae are clear.  Oropharynx is clear mucous membrane is moist.  Chest is clear to auscultation there is no labored breathing.  Heart is regular irregular rate and rhythm without murmur gallop or rub I do not really appreciate much lower extremity edema.  Abdomen is protuberant it is soft with active bowel sounds in all 4 quadrants-there is no significant tenderness to palpation.  Muscle skeletal does move all extremities 4 has her right lower upper extremity in a hard cast fingers ar   eexposed grip strength appears  to be intact although limited somewhat by the cast-- capillary refill is intact    Neurologic is grossly intact her speech is clear no lateralizing findings.  Psych she is grossly oriented but again confused with detailed questioning which appears to be the case previously she continues to be very pleasant and appropriate  Labs reviewed: Basic Metabolic Panel:  Recent Labs  05/02/16 0615 05/05/16 0700 05/16/16 0700  NA 136 138 137  K 3.6 4.8 3.9  CL 99*  100* 100*  CO2 26 30 28   GLUCOSE 155* 141* 113*  BUN 17 15 21*  CREATININE 0.74 0.69 0.78  CALCIUM 9.3 9.8 9.6   Liver Function Tests:  Recent Labs  02/02/16 2301 02/03/16 0537 05/16/16 0700  AST 20 21 16   ALT 15 14 16   ALKPHOS 50 45 87  BILITOT 0.8 1.0 0.5  PROT 6.8 6.0* 6.7  ALBUMIN 3.7 3.3* 3.3*   No results for input(s): LIPASE, AMYLASE in the last 8760 hours. No results for input(s): AMMONIA in the last 8760 hours. CBC:  Recent Labs  05/02/16 0615 05/05/16 0700 05/16/16 0700  WBC 12.1* 9.8 9.7  NEUTROABS 8.2* 6.1 6.1  HGB 15.1* 14.7 14.2  HCT 45.6 44.6 42.8  MCV 94.6 94.7 93.2  PLT 226 314 367   Cardiac Enzymes: No results for input(s): CKTOTAL, CKMB, CKMBINDEX, TROPONINI in the last 8760 hours. BNP: Invalid input(s): POCBNP CBG:  Recent Labs  04/30/16 1622 05/01/16 0802 05/01/16 1206  GLUCAP 200* 161* 162*    Procedures and Imaging Studies During Stay: Dg Wrist Complete Left  Result Date: 04/30/2016 CLINICAL DATA:  Fall.  Pain. EXAM: LEFT WRIST - COMPLETE 3+ VIEW COMPARISON:  No recent prior . FINDINGS: Diffuse degenerative change. Degenerative changes are most prominent the first carpometacarpal joint. Subchondral cyst noted the base of the first carpal most likely degenerative. No evidence of fracture dislocation. Peripheral vascular calcification. IMPRESSION: 1. Diffuse degenerative change. Degenerative changes most prominent about the first carpometacarpal joint. No acute bony  abnormality. 2. Peripheral vascular disease. Electronically Signed   By: Marcello Moores  Register   On: 04/30/2016 07:32   Dg Wrist Complete Right  Result Date: 04/29/2016 CLINICAL DATA:  Fall. EXAM: RIGHT WRIST - COMPLETE 3+ VIEW COMPARISON:  02/07/2016 . FINDINGS: Comminuted severely angulated fracture of the distal right radial metaphysis is noted. Diffuse degenerative change. Peripheral vascular calcification . IMPRESSION: 1. Comminuted severely angulated fracture of the distal radius noted. 2. Peripheral vascular disease . Electronically Signed   By: Marcello Moores  Register   On: 04/29/2016 07:35   Ct Head W & Wo Contrast  Result Date: 05/20/2016 CLINICAL DATA:  Increased falls over the past few months.  Confusion EXAM: CT HEAD WITHOUT AND WITH CONTRAST TECHNIQUE: Contiguous axial images were obtained from the base of the skull through the vertex without and with intravenous contrast CONTRAST:  12mL ISOVUE-300 IOPAMIDOL (ISOVUE-300) INJECTION 61% COMPARISON:  03/16/2010 FINDINGS: Brain: No evidence of acute infarction, hemorrhage, hydrocephalus, extra-axial collection. Atrophy and ventriculomegaly that has progressed from 2012. There is now up to moderate medial temporal volume loss. Chronic microvascular disease with confluent ischemic gliosis in the cerebral white matter. Remote small vessel infarct affecting the left caudate head. Stable dural based calcification inferiorly from the right tentorium measuring 11 mm in maximum, probable meningioma. Enhancement is not visible in this region, possibly obscured due to degree of calcification. Vascular: Atherosclerotic calcifications. Skull: Mild scalp reticulation posteriorly on the right, likely scarring based on prior. Negative for fracture. No aggressive process. Sinuses/Orbits: Chronic sinusitis on the left with calcified soft tissue/debris in the upper nasal cavity or middle meatus, partly seen. IMPRESSION: 1. Atrophy and chronic microvascular ischemia in the  cerebral white matter, both progressed from 2012. 2. 11 mm right tentorial meningioma, calcified and incidental. 3. Chronic sinusitis on the left associated with chronic partly seen calcified material narrowing the middle meatus. Electronically Signed   By: Monte Fantasia M.D.   On: 05/20/2016 13:23    Assessment/Plan:   1  history of right wrist fracture again will need continued PT and OT as well as orthopedic follow-up this appears to be stable.  #2 atrial fibrillation this appears rate controlled on Lopressor and Cardizem INR has been therapeutic will update this before discharge and adjust accordingly will need home health to draw another one early next week for follow-up and notify primary care provider of results.  #3 hypertension has variable systolics as noted above she is she is about to go home will not make medication changes will defer to her primary care provider she is on Cozaar 50 mg twice a day-Lopressor 25 mg twice a day as well as Cardizem 240 mg extended release.  #4 history diabetes type 2 this appears controlled on Glucophage blood sugars largely in the 90s to mid 100s  #5 dementia this appears to be mild Dr. Lyndel Safe did start her on low-dose Aricept will warrantfollow-up by her primary care provider.  #6 history of falls-her Klonopin and Norco were discontinued by Dr. Mauri Reading of her head did not show any acute process.  Of note Will update a CBC BMP and INR before discharge will need follow up by primary care provider currently she appears stable and doing relatively well.  [id number] note greater than 30 minutes spent on this discharge summary greater than 50% of time spent coordinating plan of care.   .     Patient is being discharged with the following home health services: Nursing support home health to help with her medical issues --including blood work follow-up INR  Patient is being discharged with the following durable medical equipment:  She will be going  to [facility] to have a platform  attached to her current rolling walker  Patient has been advised to f/u with their PCP in 1-2 weeks to bring them up to date on their rehab stay.  Social services at facility was responsible for arranging this appointment.  Pt was provided with a 30 day supply of prescriptions for medications and refills must be obtained from their PCP.  For controlled substances, a more limited supply may be provided adequate until PCP appointment only.

## 2016-05-23 ENCOUNTER — Other Ambulatory Visit (HOSPITAL_COMMUNITY)
Admission: RE | Admit: 2016-05-23 | Discharge: 2016-05-23 | Disposition: A | Discharge status: Home / Self Care | Payer: PPO | Source: Skilled Nursing Facility | Attending: Internal Medicine | Admitting: Internal Medicine

## 2016-05-23 ENCOUNTER — Other Ambulatory Visit: Payer: Self-pay | Admitting: Internal Medicine

## 2016-05-23 LAB — CBC
HCT: 42.6 % (ref 36.0–46.0)
Hemoglobin: 14.3 g/dL (ref 12.0–15.0)
MCH: 31.2 pg (ref 26.0–34.0)
MCHC: 33.6 g/dL (ref 30.0–36.0)
MCV: 93 fL (ref 78.0–100.0)
Platelets: 294 10*3/uL (ref 150–400)
RBC: 4.58 MIL/uL (ref 3.87–5.11)
RDW: 14.3 % (ref 11.5–15.5)
WBC: 9.4 10*3/uL (ref 4.0–10.5)

## 2016-05-23 LAB — BASIC METABOLIC PANEL WITH GFR
Anion gap: 10 (ref 5–15)
BUN: 16 mg/dL (ref 6–20)
CO2: 27 mmol/L (ref 22–32)
Calcium: 9.6 mg/dL (ref 8.9–10.3)
Chloride: 101 mmol/L (ref 101–111)
Creatinine, Ser: 0.76 mg/dL (ref 0.44–1.00)
GFR calc Af Amer: >60 mL/min
GFR calc non Af Amer: >60 mL/min
Glucose, Bld: 89 mg/dL (ref 65–99)
Potassium: 3.6 mmol/L (ref 3.5–5.1)
Sodium: 138 mmol/L (ref 135–145)

## 2016-05-23 LAB — PROTIME-INR
INR: 1.46
INR: 1.39
PROTHROMBIN TIME: 17.9 s — AB (ref 11.4–15.2)
PROTHROMBIN TIME: 17.2 s — AB (ref 11.4–15.2)

## 2016-05-24 DIAGNOSIS — Z96631 Presence of right artificial wrist joint: Secondary | ICD-10-CM | POA: Diagnosis not present

## 2016-05-24 DIAGNOSIS — Z7901 Long term (current) use of anticoagulants: Secondary | ICD-10-CM | POA: Diagnosis not present

## 2016-05-24 DIAGNOSIS — I2510 Atherosclerotic heart disease of native coronary artery without angina pectoris: Secondary | ICD-10-CM | POA: Diagnosis not present

## 2016-05-24 DIAGNOSIS — F329 Major depressive disorder, single episode, unspecified: Secondary | ICD-10-CM | POA: Diagnosis not present

## 2016-05-24 DIAGNOSIS — R296 Repeated falls: Secondary | ICD-10-CM | POA: Diagnosis not present

## 2016-05-24 DIAGNOSIS — I495 Sick sinus syndrome: Secondary | ICD-10-CM | POA: Diagnosis not present

## 2016-05-24 DIAGNOSIS — I10 Essential (primary) hypertension: Secondary | ICD-10-CM | POA: Diagnosis not present

## 2016-05-24 DIAGNOSIS — F0390 Unspecified dementia without behavioral disturbance: Secondary | ICD-10-CM | POA: Diagnosis not present

## 2016-05-24 DIAGNOSIS — I4891 Unspecified atrial fibrillation: Secondary | ICD-10-CM | POA: Diagnosis not present

## 2016-05-24 DIAGNOSIS — Z5181 Encounter for therapeutic drug level monitoring: Secondary | ICD-10-CM | POA: Diagnosis not present

## 2016-05-24 DIAGNOSIS — S52501D Unspecified fracture of the lower end of right radius, subsequent encounter for closed fracture with routine healing: Secondary | ICD-10-CM | POA: Diagnosis not present

## 2016-05-24 DIAGNOSIS — E785 Hyperlipidemia, unspecified: Secondary | ICD-10-CM | POA: Diagnosis not present

## 2016-05-24 DIAGNOSIS — Z7984 Long term (current) use of oral hypoglycemic drugs: Secondary | ICD-10-CM | POA: Diagnosis not present

## 2016-05-24 DIAGNOSIS — E7439 Other disorders of intestinal carbohydrate absorption: Secondary | ICD-10-CM | POA: Diagnosis not present

## 2016-05-24 DIAGNOSIS — E119 Type 2 diabetes mellitus without complications: Secondary | ICD-10-CM | POA: Diagnosis not present

## 2016-05-26 ENCOUNTER — Encounter (HOSPITAL_COMMUNITY)
Admission: RE | Admit: 2016-05-26 | Discharge: 2016-05-26 | Disposition: A | Discharge status: Home / Self Care | Payer: PPO | Source: Skilled Nursing Facility | Attending: *Deleted | Admitting: *Deleted

## 2016-05-26 DIAGNOSIS — I4891 Unspecified atrial fibrillation: Secondary | ICD-10-CM | POA: Diagnosis not present

## 2016-05-26 DIAGNOSIS — E119 Type 2 diabetes mellitus without complications: Secondary | ICD-10-CM | POA: Diagnosis not present

## 2016-05-26 DIAGNOSIS — I2510 Atherosclerotic heart disease of native coronary artery without angina pectoris: Secondary | ICD-10-CM | POA: Diagnosis not present

## 2016-05-26 DIAGNOSIS — F329 Major depressive disorder, single episode, unspecified: Secondary | ICD-10-CM | POA: Diagnosis not present

## 2016-05-26 DIAGNOSIS — S52501D Unspecified fracture of the lower end of right radius, subsequent encounter for closed fracture with routine healing: Secondary | ICD-10-CM | POA: Diagnosis not present

## 2016-05-26 DIAGNOSIS — R296 Repeated falls: Secondary | ICD-10-CM | POA: Diagnosis not present

## 2016-05-26 DIAGNOSIS — Z7901 Long term (current) use of anticoagulants: Secondary | ICD-10-CM | POA: Diagnosis not present

## 2016-05-26 DIAGNOSIS — I10 Essential (primary) hypertension: Secondary | ICD-10-CM | POA: Diagnosis not present

## 2016-05-26 DIAGNOSIS — Z96631 Presence of right artificial wrist joint: Secondary | ICD-10-CM | POA: Diagnosis not present

## 2016-05-26 DIAGNOSIS — I495 Sick sinus syndrome: Secondary | ICD-10-CM | POA: Diagnosis not present

## 2016-05-26 DIAGNOSIS — Z5181 Encounter for therapeutic drug level monitoring: Secondary | ICD-10-CM | POA: Diagnosis not present

## 2016-05-26 DIAGNOSIS — Z7984 Long term (current) use of oral hypoglycemic drugs: Secondary | ICD-10-CM | POA: Diagnosis not present

## 2016-05-26 DIAGNOSIS — E785 Hyperlipidemia, unspecified: Secondary | ICD-10-CM | POA: Diagnosis not present

## 2016-05-26 DIAGNOSIS — F0390 Unspecified dementia without behavioral disturbance: Secondary | ICD-10-CM | POA: Diagnosis not present

## 2016-05-26 DIAGNOSIS — E7439 Other disorders of intestinal carbohydrate absorption: Secondary | ICD-10-CM | POA: Diagnosis not present

## 2016-05-27 DIAGNOSIS — F0390 Unspecified dementia without behavioral disturbance: Secondary | ICD-10-CM | POA: Diagnosis not present

## 2016-05-27 DIAGNOSIS — I10 Essential (primary) hypertension: Secondary | ICD-10-CM | POA: Diagnosis not present

## 2016-05-29 DIAGNOSIS — Z4789 Encounter for other orthopedic aftercare: Secondary | ICD-10-CM | POA: Diagnosis not present

## 2016-05-29 DIAGNOSIS — S52591D Other fractures of lower end of right radius, subsequent encounter for closed fracture with routine healing: Secondary | ICD-10-CM | POA: Diagnosis not present

## 2016-05-30 DIAGNOSIS — Z96631 Presence of right artificial wrist joint: Secondary | ICD-10-CM | POA: Diagnosis not present

## 2016-05-30 DIAGNOSIS — R296 Repeated falls: Secondary | ICD-10-CM | POA: Diagnosis not present

## 2016-05-30 DIAGNOSIS — F0390 Unspecified dementia without behavioral disturbance: Secondary | ICD-10-CM | POA: Diagnosis not present

## 2016-05-30 DIAGNOSIS — E785 Hyperlipidemia, unspecified: Secondary | ICD-10-CM | POA: Diagnosis not present

## 2016-05-30 DIAGNOSIS — Z7984 Long term (current) use of oral hypoglycemic drugs: Secondary | ICD-10-CM | POA: Diagnosis not present

## 2016-05-30 DIAGNOSIS — I2510 Atherosclerotic heart disease of native coronary artery without angina pectoris: Secondary | ICD-10-CM | POA: Diagnosis not present

## 2016-05-30 DIAGNOSIS — E119 Type 2 diabetes mellitus without complications: Secondary | ICD-10-CM | POA: Diagnosis not present

## 2016-05-30 DIAGNOSIS — S52501D Unspecified fracture of the lower end of right radius, subsequent encounter for closed fracture with routine healing: Secondary | ICD-10-CM | POA: Diagnosis not present

## 2016-05-30 DIAGNOSIS — Z7901 Long term (current) use of anticoagulants: Secondary | ICD-10-CM | POA: Diagnosis not present

## 2016-05-30 DIAGNOSIS — Z5181 Encounter for therapeutic drug level monitoring: Secondary | ICD-10-CM | POA: Diagnosis not present

## 2016-05-30 DIAGNOSIS — I4891 Unspecified atrial fibrillation: Secondary | ICD-10-CM | POA: Diagnosis not present

## 2016-05-30 DIAGNOSIS — F329 Major depressive disorder, single episode, unspecified: Secondary | ICD-10-CM | POA: Diagnosis not present

## 2016-05-30 DIAGNOSIS — I495 Sick sinus syndrome: Secondary | ICD-10-CM | POA: Diagnosis not present

## 2016-05-30 DIAGNOSIS — E7439 Other disorders of intestinal carbohydrate absorption: Secondary | ICD-10-CM | POA: Diagnosis not present

## 2016-05-30 DIAGNOSIS — I10 Essential (primary) hypertension: Secondary | ICD-10-CM | POA: Diagnosis not present

## 2016-06-06 DIAGNOSIS — F329 Major depressive disorder, single episode, unspecified: Secondary | ICD-10-CM | POA: Diagnosis not present

## 2016-06-06 DIAGNOSIS — Z5181 Encounter for therapeutic drug level monitoring: Secondary | ICD-10-CM | POA: Diagnosis not present

## 2016-06-06 DIAGNOSIS — Z96631 Presence of right artificial wrist joint: Secondary | ICD-10-CM | POA: Diagnosis not present

## 2016-06-06 DIAGNOSIS — I10 Essential (primary) hypertension: Secondary | ICD-10-CM | POA: Diagnosis not present

## 2016-06-06 DIAGNOSIS — E7439 Other disorders of intestinal carbohydrate absorption: Secondary | ICD-10-CM | POA: Diagnosis not present

## 2016-06-06 DIAGNOSIS — F0390 Unspecified dementia without behavioral disturbance: Secondary | ICD-10-CM | POA: Diagnosis not present

## 2016-06-06 DIAGNOSIS — I495 Sick sinus syndrome: Secondary | ICD-10-CM | POA: Diagnosis not present

## 2016-06-06 DIAGNOSIS — Z7984 Long term (current) use of oral hypoglycemic drugs: Secondary | ICD-10-CM | POA: Diagnosis not present

## 2016-06-06 DIAGNOSIS — I2510 Atherosclerotic heart disease of native coronary artery without angina pectoris: Secondary | ICD-10-CM | POA: Diagnosis not present

## 2016-06-06 DIAGNOSIS — E785 Hyperlipidemia, unspecified: Secondary | ICD-10-CM | POA: Diagnosis not present

## 2016-06-06 DIAGNOSIS — E119 Type 2 diabetes mellitus without complications: Secondary | ICD-10-CM | POA: Diagnosis not present

## 2016-06-06 DIAGNOSIS — S52501D Unspecified fracture of the lower end of right radius, subsequent encounter for closed fracture with routine healing: Secondary | ICD-10-CM | POA: Diagnosis not present

## 2016-06-06 DIAGNOSIS — Z7901 Long term (current) use of anticoagulants: Secondary | ICD-10-CM | POA: Diagnosis not present

## 2016-06-06 DIAGNOSIS — I4891 Unspecified atrial fibrillation: Secondary | ICD-10-CM | POA: Diagnosis not present

## 2016-06-06 DIAGNOSIS — R296 Repeated falls: Secondary | ICD-10-CM | POA: Diagnosis not present

## 2016-06-11 DIAGNOSIS — Z7901 Long term (current) use of anticoagulants: Secondary | ICD-10-CM | POA: Diagnosis not present

## 2016-06-11 DIAGNOSIS — Z96631 Presence of right artificial wrist joint: Secondary | ICD-10-CM | POA: Diagnosis not present

## 2016-06-11 DIAGNOSIS — I2510 Atherosclerotic heart disease of native coronary artery without angina pectoris: Secondary | ICD-10-CM | POA: Diagnosis not present

## 2016-06-11 DIAGNOSIS — E7439 Other disorders of intestinal carbohydrate absorption: Secondary | ICD-10-CM | POA: Diagnosis not present

## 2016-06-11 DIAGNOSIS — Z5181 Encounter for therapeutic drug level monitoring: Secondary | ICD-10-CM | POA: Diagnosis not present

## 2016-06-11 DIAGNOSIS — F329 Major depressive disorder, single episode, unspecified: Secondary | ICD-10-CM | POA: Diagnosis not present

## 2016-06-11 DIAGNOSIS — E119 Type 2 diabetes mellitus without complications: Secondary | ICD-10-CM | POA: Diagnosis not present

## 2016-06-11 DIAGNOSIS — F0390 Unspecified dementia without behavioral disturbance: Secondary | ICD-10-CM | POA: Diagnosis not present

## 2016-06-11 DIAGNOSIS — I4891 Unspecified atrial fibrillation: Secondary | ICD-10-CM | POA: Diagnosis not present

## 2016-06-11 DIAGNOSIS — I495 Sick sinus syndrome: Secondary | ICD-10-CM | POA: Diagnosis not present

## 2016-06-11 DIAGNOSIS — R296 Repeated falls: Secondary | ICD-10-CM | POA: Diagnosis not present

## 2016-06-11 DIAGNOSIS — Z7984 Long term (current) use of oral hypoglycemic drugs: Secondary | ICD-10-CM | POA: Diagnosis not present

## 2016-06-11 DIAGNOSIS — E785 Hyperlipidemia, unspecified: Secondary | ICD-10-CM | POA: Diagnosis not present

## 2016-06-11 DIAGNOSIS — I10 Essential (primary) hypertension: Secondary | ICD-10-CM | POA: Diagnosis not present

## 2016-06-11 DIAGNOSIS — S52501D Unspecified fracture of the lower end of right radius, subsequent encounter for closed fracture with routine healing: Secondary | ICD-10-CM | POA: Diagnosis not present

## 2016-06-12 DIAGNOSIS — S62101D Fracture of unspecified carpal bone, right wrist, subsequent encounter for fracture with routine healing: Secondary | ICD-10-CM | POA: Diagnosis not present

## 2016-06-12 DIAGNOSIS — S52591D Other fractures of lower end of right radius, subsequent encounter for closed fracture with routine healing: Secondary | ICD-10-CM | POA: Diagnosis not present

## 2016-06-12 DIAGNOSIS — Z4789 Encounter for other orthopedic aftercare: Secondary | ICD-10-CM | POA: Diagnosis not present

## 2016-06-13 DIAGNOSIS — R296 Repeated falls: Secondary | ICD-10-CM | POA: Diagnosis not present

## 2016-06-13 DIAGNOSIS — Z5181 Encounter for therapeutic drug level monitoring: Secondary | ICD-10-CM | POA: Diagnosis not present

## 2016-06-13 DIAGNOSIS — F0390 Unspecified dementia without behavioral disturbance: Secondary | ICD-10-CM | POA: Diagnosis not present

## 2016-06-13 DIAGNOSIS — E785 Hyperlipidemia, unspecified: Secondary | ICD-10-CM | POA: Diagnosis not present

## 2016-06-13 DIAGNOSIS — I2510 Atherosclerotic heart disease of native coronary artery without angina pectoris: Secondary | ICD-10-CM | POA: Diagnosis not present

## 2016-06-13 DIAGNOSIS — Z7984 Long term (current) use of oral hypoglycemic drugs: Secondary | ICD-10-CM | POA: Diagnosis not present

## 2016-06-13 DIAGNOSIS — F329 Major depressive disorder, single episode, unspecified: Secondary | ICD-10-CM | POA: Diagnosis not present

## 2016-06-13 DIAGNOSIS — I495 Sick sinus syndrome: Secondary | ICD-10-CM | POA: Diagnosis not present

## 2016-06-13 DIAGNOSIS — Z7901 Long term (current) use of anticoagulants: Secondary | ICD-10-CM | POA: Diagnosis not present

## 2016-06-13 DIAGNOSIS — E7439 Other disorders of intestinal carbohydrate absorption: Secondary | ICD-10-CM | POA: Diagnosis not present

## 2016-06-13 DIAGNOSIS — I482 Chronic atrial fibrillation: Secondary | ICD-10-CM | POA: Diagnosis not present

## 2016-06-13 DIAGNOSIS — S52501D Unspecified fracture of the lower end of right radius, subsequent encounter for closed fracture with routine healing: Secondary | ICD-10-CM | POA: Diagnosis not present

## 2016-06-13 DIAGNOSIS — I10 Essential (primary) hypertension: Secondary | ICD-10-CM | POA: Diagnosis not present

## 2016-06-13 DIAGNOSIS — Z96631 Presence of right artificial wrist joint: Secondary | ICD-10-CM | POA: Diagnosis not present

## 2016-06-13 DIAGNOSIS — E119 Type 2 diabetes mellitus without complications: Secondary | ICD-10-CM | POA: Diagnosis not present

## 2016-06-13 DIAGNOSIS — I4891 Unspecified atrial fibrillation: Secondary | ICD-10-CM | POA: Diagnosis not present

## 2016-06-16 DIAGNOSIS — F329 Major depressive disorder, single episode, unspecified: Secondary | ICD-10-CM | POA: Diagnosis not present

## 2016-06-16 DIAGNOSIS — I4891 Unspecified atrial fibrillation: Secondary | ICD-10-CM | POA: Diagnosis not present

## 2016-06-16 DIAGNOSIS — Z96631 Presence of right artificial wrist joint: Secondary | ICD-10-CM | POA: Diagnosis not present

## 2016-06-16 DIAGNOSIS — Z7984 Long term (current) use of oral hypoglycemic drugs: Secondary | ICD-10-CM | POA: Diagnosis not present

## 2016-06-16 DIAGNOSIS — Z7901 Long term (current) use of anticoagulants: Secondary | ICD-10-CM | POA: Diagnosis not present

## 2016-06-16 DIAGNOSIS — E7439 Other disorders of intestinal carbohydrate absorption: Secondary | ICD-10-CM | POA: Diagnosis not present

## 2016-06-16 DIAGNOSIS — I2510 Atherosclerotic heart disease of native coronary artery without angina pectoris: Secondary | ICD-10-CM | POA: Diagnosis not present

## 2016-06-16 DIAGNOSIS — R296 Repeated falls: Secondary | ICD-10-CM | POA: Diagnosis not present

## 2016-06-16 DIAGNOSIS — S52501D Unspecified fracture of the lower end of right radius, subsequent encounter for closed fracture with routine healing: Secondary | ICD-10-CM | POA: Diagnosis not present

## 2016-06-16 DIAGNOSIS — I10 Essential (primary) hypertension: Secondary | ICD-10-CM | POA: Diagnosis not present

## 2016-06-16 DIAGNOSIS — Z5181 Encounter for therapeutic drug level monitoring: Secondary | ICD-10-CM | POA: Diagnosis not present

## 2016-06-16 DIAGNOSIS — E785 Hyperlipidemia, unspecified: Secondary | ICD-10-CM | POA: Diagnosis not present

## 2016-06-16 DIAGNOSIS — F0390 Unspecified dementia without behavioral disturbance: Secondary | ICD-10-CM | POA: Diagnosis not present

## 2016-06-16 DIAGNOSIS — E119 Type 2 diabetes mellitus without complications: Secondary | ICD-10-CM | POA: Diagnosis not present

## 2016-06-16 DIAGNOSIS — I495 Sick sinus syndrome: Secondary | ICD-10-CM | POA: Diagnosis not present

## 2016-06-18 DIAGNOSIS — I4891 Unspecified atrial fibrillation: Secondary | ICD-10-CM | POA: Diagnosis not present

## 2016-06-18 DIAGNOSIS — Z5181 Encounter for therapeutic drug level monitoring: Secondary | ICD-10-CM | POA: Diagnosis not present

## 2016-06-18 DIAGNOSIS — S52501D Unspecified fracture of the lower end of right radius, subsequent encounter for closed fracture with routine healing: Secondary | ICD-10-CM | POA: Diagnosis not present

## 2016-06-18 DIAGNOSIS — E119 Type 2 diabetes mellitus without complications: Secondary | ICD-10-CM | POA: Diagnosis not present

## 2016-06-18 DIAGNOSIS — I10 Essential (primary) hypertension: Secondary | ICD-10-CM | POA: Diagnosis not present

## 2016-06-18 DIAGNOSIS — F329 Major depressive disorder, single episode, unspecified: Secondary | ICD-10-CM | POA: Diagnosis not present

## 2016-06-18 DIAGNOSIS — I495 Sick sinus syndrome: Secondary | ICD-10-CM | POA: Diagnosis not present

## 2016-06-18 DIAGNOSIS — E785 Hyperlipidemia, unspecified: Secondary | ICD-10-CM | POA: Diagnosis not present

## 2016-06-18 DIAGNOSIS — F0390 Unspecified dementia without behavioral disturbance: Secondary | ICD-10-CM | POA: Diagnosis not present

## 2016-06-18 DIAGNOSIS — I2510 Atherosclerotic heart disease of native coronary artery without angina pectoris: Secondary | ICD-10-CM | POA: Diagnosis not present

## 2016-06-18 DIAGNOSIS — R296 Repeated falls: Secondary | ICD-10-CM | POA: Diagnosis not present

## 2016-06-18 DIAGNOSIS — Z7984 Long term (current) use of oral hypoglycemic drugs: Secondary | ICD-10-CM | POA: Diagnosis not present

## 2016-06-18 DIAGNOSIS — Z7901 Long term (current) use of anticoagulants: Secondary | ICD-10-CM | POA: Diagnosis not present

## 2016-06-18 DIAGNOSIS — E7439 Other disorders of intestinal carbohydrate absorption: Secondary | ICD-10-CM | POA: Diagnosis not present

## 2016-06-18 DIAGNOSIS — Z96631 Presence of right artificial wrist joint: Secondary | ICD-10-CM | POA: Diagnosis not present

## 2016-06-20 DIAGNOSIS — R296 Repeated falls: Secondary | ICD-10-CM | POA: Diagnosis not present

## 2016-06-20 DIAGNOSIS — E785 Hyperlipidemia, unspecified: Secondary | ICD-10-CM | POA: Diagnosis not present

## 2016-06-20 DIAGNOSIS — Z96631 Presence of right artificial wrist joint: Secondary | ICD-10-CM | POA: Diagnosis not present

## 2016-06-20 DIAGNOSIS — E7439 Other disorders of intestinal carbohydrate absorption: Secondary | ICD-10-CM | POA: Diagnosis not present

## 2016-06-20 DIAGNOSIS — I495 Sick sinus syndrome: Secondary | ICD-10-CM | POA: Diagnosis not present

## 2016-06-20 DIAGNOSIS — F0390 Unspecified dementia without behavioral disturbance: Secondary | ICD-10-CM | POA: Diagnosis not present

## 2016-06-20 DIAGNOSIS — Z7984 Long term (current) use of oral hypoglycemic drugs: Secondary | ICD-10-CM | POA: Diagnosis not present

## 2016-06-20 DIAGNOSIS — F329 Major depressive disorder, single episode, unspecified: Secondary | ICD-10-CM | POA: Diagnosis not present

## 2016-06-20 DIAGNOSIS — I10 Essential (primary) hypertension: Secondary | ICD-10-CM | POA: Diagnosis not present

## 2016-06-20 DIAGNOSIS — E119 Type 2 diabetes mellitus without complications: Secondary | ICD-10-CM | POA: Diagnosis not present

## 2016-06-20 DIAGNOSIS — Z7901 Long term (current) use of anticoagulants: Secondary | ICD-10-CM | POA: Diagnosis not present

## 2016-06-20 DIAGNOSIS — S52501D Unspecified fracture of the lower end of right radius, subsequent encounter for closed fracture with routine healing: Secondary | ICD-10-CM | POA: Diagnosis not present

## 2016-06-20 DIAGNOSIS — Z5181 Encounter for therapeutic drug level monitoring: Secondary | ICD-10-CM | POA: Diagnosis not present

## 2016-06-20 DIAGNOSIS — I2510 Atherosclerotic heart disease of native coronary artery without angina pectoris: Secondary | ICD-10-CM | POA: Diagnosis not present

## 2016-06-20 DIAGNOSIS — I4891 Unspecified atrial fibrillation: Secondary | ICD-10-CM | POA: Diagnosis not present

## 2016-06-24 DIAGNOSIS — Z7901 Long term (current) use of anticoagulants: Secondary | ICD-10-CM | POA: Diagnosis not present

## 2016-06-24 DIAGNOSIS — I10 Essential (primary) hypertension: Secondary | ICD-10-CM | POA: Diagnosis not present

## 2016-06-24 DIAGNOSIS — I2510 Atherosclerotic heart disease of native coronary artery without angina pectoris: Secondary | ICD-10-CM | POA: Diagnosis not present

## 2016-06-24 DIAGNOSIS — Z5181 Encounter for therapeutic drug level monitoring: Secondary | ICD-10-CM | POA: Diagnosis not present

## 2016-06-24 DIAGNOSIS — E785 Hyperlipidemia, unspecified: Secondary | ICD-10-CM | POA: Diagnosis not present

## 2016-06-24 DIAGNOSIS — Z7984 Long term (current) use of oral hypoglycemic drugs: Secondary | ICD-10-CM | POA: Diagnosis not present

## 2016-06-24 DIAGNOSIS — E7439 Other disorders of intestinal carbohydrate absorption: Secondary | ICD-10-CM | POA: Diagnosis not present

## 2016-06-24 DIAGNOSIS — S52501D Unspecified fracture of the lower end of right radius, subsequent encounter for closed fracture with routine healing: Secondary | ICD-10-CM | POA: Diagnosis not present

## 2016-06-24 DIAGNOSIS — F329 Major depressive disorder, single episode, unspecified: Secondary | ICD-10-CM | POA: Diagnosis not present

## 2016-06-24 DIAGNOSIS — I495 Sick sinus syndrome: Secondary | ICD-10-CM | POA: Diagnosis not present

## 2016-06-24 DIAGNOSIS — F0390 Unspecified dementia without behavioral disturbance: Secondary | ICD-10-CM | POA: Diagnosis not present

## 2016-06-24 DIAGNOSIS — I4891 Unspecified atrial fibrillation: Secondary | ICD-10-CM | POA: Diagnosis not present

## 2016-06-24 DIAGNOSIS — R296 Repeated falls: Secondary | ICD-10-CM | POA: Diagnosis not present

## 2016-06-24 DIAGNOSIS — E119 Type 2 diabetes mellitus without complications: Secondary | ICD-10-CM | POA: Diagnosis not present

## 2016-06-24 DIAGNOSIS — Z96631 Presence of right artificial wrist joint: Secondary | ICD-10-CM | POA: Diagnosis not present

## 2016-06-25 DIAGNOSIS — R296 Repeated falls: Secondary | ICD-10-CM | POA: Diagnosis not present

## 2016-06-25 DIAGNOSIS — E785 Hyperlipidemia, unspecified: Secondary | ICD-10-CM | POA: Diagnosis not present

## 2016-06-25 DIAGNOSIS — Z96631 Presence of right artificial wrist joint: Secondary | ICD-10-CM | POA: Diagnosis not present

## 2016-06-25 DIAGNOSIS — E119 Type 2 diabetes mellitus without complications: Secondary | ICD-10-CM | POA: Diagnosis not present

## 2016-06-25 DIAGNOSIS — F329 Major depressive disorder, single episode, unspecified: Secondary | ICD-10-CM | POA: Diagnosis not present

## 2016-06-25 DIAGNOSIS — Z7984 Long term (current) use of oral hypoglycemic drugs: Secondary | ICD-10-CM | POA: Diagnosis not present

## 2016-06-25 DIAGNOSIS — I4891 Unspecified atrial fibrillation: Secondary | ICD-10-CM | POA: Diagnosis not present

## 2016-06-25 DIAGNOSIS — S52501D Unspecified fracture of the lower end of right radius, subsequent encounter for closed fracture with routine healing: Secondary | ICD-10-CM | POA: Diagnosis not present

## 2016-06-25 DIAGNOSIS — Z5181 Encounter for therapeutic drug level monitoring: Secondary | ICD-10-CM | POA: Diagnosis not present

## 2016-06-25 DIAGNOSIS — F0390 Unspecified dementia without behavioral disturbance: Secondary | ICD-10-CM | POA: Diagnosis not present

## 2016-06-25 DIAGNOSIS — E7439 Other disorders of intestinal carbohydrate absorption: Secondary | ICD-10-CM | POA: Diagnosis not present

## 2016-06-25 DIAGNOSIS — I495 Sick sinus syndrome: Secondary | ICD-10-CM | POA: Diagnosis not present

## 2016-06-25 DIAGNOSIS — I10 Essential (primary) hypertension: Secondary | ICD-10-CM | POA: Diagnosis not present

## 2016-06-25 DIAGNOSIS — I2510 Atherosclerotic heart disease of native coronary artery without angina pectoris: Secondary | ICD-10-CM | POA: Diagnosis not present

## 2016-06-25 DIAGNOSIS — Z7901 Long term (current) use of anticoagulants: Secondary | ICD-10-CM | POA: Diagnosis not present

## 2016-06-27 DIAGNOSIS — Z96631 Presence of right artificial wrist joint: Secondary | ICD-10-CM | POA: Diagnosis not present

## 2016-06-27 DIAGNOSIS — E119 Type 2 diabetes mellitus without complications: Secondary | ICD-10-CM | POA: Diagnosis not present

## 2016-06-27 DIAGNOSIS — I495 Sick sinus syndrome: Secondary | ICD-10-CM | POA: Diagnosis not present

## 2016-06-27 DIAGNOSIS — Z5181 Encounter for therapeutic drug level monitoring: Secondary | ICD-10-CM | POA: Diagnosis not present

## 2016-06-27 DIAGNOSIS — Z7984 Long term (current) use of oral hypoglycemic drugs: Secondary | ICD-10-CM | POA: Diagnosis not present

## 2016-06-27 DIAGNOSIS — R296 Repeated falls: Secondary | ICD-10-CM | POA: Diagnosis not present

## 2016-06-27 DIAGNOSIS — E7439 Other disorders of intestinal carbohydrate absorption: Secondary | ICD-10-CM | POA: Diagnosis not present

## 2016-06-27 DIAGNOSIS — F0390 Unspecified dementia without behavioral disturbance: Secondary | ICD-10-CM | POA: Diagnosis not present

## 2016-06-27 DIAGNOSIS — F329 Major depressive disorder, single episode, unspecified: Secondary | ICD-10-CM | POA: Diagnosis not present

## 2016-06-27 DIAGNOSIS — S52501D Unspecified fracture of the lower end of right radius, subsequent encounter for closed fracture with routine healing: Secondary | ICD-10-CM | POA: Diagnosis not present

## 2016-06-27 DIAGNOSIS — I4891 Unspecified atrial fibrillation: Secondary | ICD-10-CM | POA: Diagnosis not present

## 2016-06-27 DIAGNOSIS — Z7901 Long term (current) use of anticoagulants: Secondary | ICD-10-CM | POA: Diagnosis not present

## 2016-06-27 DIAGNOSIS — I2510 Atherosclerotic heart disease of native coronary artery without angina pectoris: Secondary | ICD-10-CM | POA: Diagnosis not present

## 2016-06-27 DIAGNOSIS — I10 Essential (primary) hypertension: Secondary | ICD-10-CM | POA: Diagnosis not present

## 2016-06-27 DIAGNOSIS — E785 Hyperlipidemia, unspecified: Secondary | ICD-10-CM | POA: Diagnosis not present

## 2016-06-30 ENCOUNTER — Encounter (HOSPITAL_COMMUNITY): Payer: Self-pay | Admitting: Orthopedic Surgery

## 2016-06-30 NOTE — Addendum Note (Signed)
Addendum  created 06/30/16 1322 by Oleta Mouse, MD   Sign clinical note

## 2016-07-03 DIAGNOSIS — Z4789 Encounter for other orthopedic aftercare: Secondary | ICD-10-CM | POA: Diagnosis not present

## 2016-07-03 DIAGNOSIS — S52591D Other fractures of lower end of right radius, subsequent encounter for closed fracture with routine healing: Secondary | ICD-10-CM | POA: Diagnosis not present

## 2016-07-04 DIAGNOSIS — I482 Chronic atrial fibrillation: Secondary | ICD-10-CM | POA: Diagnosis not present

## 2016-07-04 DIAGNOSIS — Z6826 Body mass index (BMI) 26.0-26.9, adult: Secondary | ICD-10-CM | POA: Diagnosis not present

## 2016-07-17 ENCOUNTER — Encounter: Payer: Self-pay | Admitting: Internal Medicine

## 2016-07-17 ENCOUNTER — Ambulatory Visit (INDEPENDENT_AMBULATORY_CARE_PROVIDER_SITE_OTHER): Payer: PPO | Admitting: Internal Medicine

## 2016-07-17 VITALS — BP 102/62 | HR 85 | Ht 64.0 in | Wt 154.0 lb

## 2016-07-17 DIAGNOSIS — I495 Sick sinus syndrome: Secondary | ICD-10-CM

## 2016-07-17 DIAGNOSIS — Z45018 Encounter for adjustment and management of other part of cardiac pacemaker: Secondary | ICD-10-CM

## 2016-07-17 DIAGNOSIS — I482 Chronic atrial fibrillation, unspecified: Secondary | ICD-10-CM

## 2016-07-17 MED ORDER — LOSARTAN POTASSIUM 50 MG PO TABS: 25.0000 mg | ORAL_TABLET | Freq: Every day | ORAL | 2 refills | Status: DC

## 2016-07-17 NOTE — Patient Instructions (Addendum)
Medication Instructions:   Your physician has recommended you make the following change in your medication:  1) DECREASE Losartan to 25 mg every morning.  --- If you need a refill on your cardiac medications before your next appointment, please call your pharmacy. ---  Labwork:  None ordered  Testing/Procedures:  None ordered  Follow-Up: Remote monitoring is used to monitor your Pacemaker of ICD from home. This monitoring reduces the number of office visits required to check your device to one time per year. It allows [place] to keep an eye on the functioning of your device to ensure it is working properly. You are scheduled for a device check from home on 10/16/2016. You may send your transmission at any time that day. If you have a wireless device, the transmission will be sent automatically. After your physician reviews your transmission, you will receive a postcard with your next transmission date.   Your physician wants you to follow-up in: 6 months with Dr. Lovena Le.  You will receive a reminder letter in the mail two months in advance. If you don't receive a letter, please call our office to schedule the follow-up appointment.  Thank you for choosing [facility]!!

## 2016-07-17 NOTE — Progress Notes (Signed)
HPI Traci [name] returns today for followup. She is a 82 year old woman with chronic atrial fibrillation, hypertension, and symptomatic tachycardia bradycardia syndrome, status post permanent pacemaker insertion. Since I saw her last, she has lost her daughter who was hit by a car. She has not felt like doing much since then. She had lost her husband several months before. In the interim, she has had some low blood pressure.  Allergies  Allergen Reactions  . Morphine Nausea Only  . Penicillins Other (See Comments)    Has patient had a PCN reaction causing immediate rash, facial/tongue/throat swelling, SOB or lightheadedness with hypotension: NO Has patient had a PCN reaction causing severe rash involving mucus membranes or skin necrosis: no Has patient had a PCN reaction that required hospitalization: NO Has patient had a PCN reaction occurring within the last 10 years: NO If all of the above answers are "NO", then may proceed with Cephalosporin use.      Current Outpatient Prescriptions  Medication Sig Dispense Refill  . diltiazem (CARDIZEM CD) 240 MG 24 hr capsule Take 240 mg by mouth daily.    Marland Kitchen escitalopram (LEXAPRO) 10 MG tablet Take 20 mg by mouth at bedtime.     Marland Kitchen losartan (COZAAR) 50 MG tablet TAKE 1 TABLET(50 MG) BY MOUTH TWICE DAILY 180 tablet 2  . metoprolol tartrate (LOPRESSOR) 25 MG tablet Take 25 mg by mouth 2 (two) times daily.    . polyethylene glycol (MIRALAX / GLYCOLAX) packet Take 17 g by mouth daily as needed for mild constipation. 14 each 0  . warfarin (COUMADIN) 5 MG tablet Take 2.5-5 mg by mouth daily. Take 2.5 mg on Mon / Tue / Thurs / Sat / Sun Take 5 mg on Wed / Fri     No current facility-administered medications for this visit.      Past Medical History:  Diagnosis Date  . Arthritis   . Atrial fibrillation ([place])   . Carotid artery disease ([place])    Right carotid endarectomy 1999  . Chronic anticoagulation    Followed by PMD  . Coronary atherosclerosis     a. Minor at cardiac catheterization 2005 b. cath 10/16/2014 40% prox LAD dx, otherwise minimal CAD  . Depression   . Essential hypertension   . Glucose intolerance (impaired glucose tolerance)   . History of kidney stones   . Hyperlipidemia   . Pneumonia 2010  . Sick sinus syndrome ([facility])    Medtronic PPM    ROS:   All systems reviewed and negative except as noted in the HPI.   Past Surgical History:  Procedure Laterality Date  . ABDOMINAL HYSTERECTOMY    . APPENDECTOMY    . BREAST BIOPSY Bilateral    x 7 total  . CARDIAC CATHETERIZATION N/A 10/16/2014   Procedure: Left Heart Cath and Coronary Angiography;  Surgeon: Leonie Man, MD;  Location: [place] [facility];  Service: Cardiovascular;  Laterality: N/A;  . CAROTID ENDARTERECTOMY Right 1999  . CATARACT EXTRACTION W/ INTRAOCULAR LENS  IMPLANT, BILATERAL Bilateral   . COLONOSCOPY     2006  . CYSTOSCOPY W/ URETERAL STENT PLACEMENT Left 04/19/2012   Procedure: CYSTOSCOPY WITH RETROGRADE PYELOGRAM/URETERAL STENT PLACEMENT ;  Surgeon: Ailene Rud, MD;  [facility];  Service: Urology;  Laterality: Left;  . CYSTOSCOPY WITH RETROGRADE PYELOGRAM, URETEROSCOPY AND STENT PLACEMENT Left 06/30/2012   Procedure: CYSTOSCOPY WITH LEFT  RETROGRADE PYELOGRAM, URETEROSCOPY  with basketing of stone, AND STENT PLACEMENT, TRANSURETHRAL UNROOFING OF URETER.;  Surgeon: Hubbard Robinson  Tresa Moore, MD;  [facility];  Service: Urology;  Laterality: Left;  . INSERT / REPLACE / REMOVE PACEMAKER  2006  . OPEN REDUCTION INTERNAL FIXATION (ORIF) DISTAL RADIAL FRACTURE Right 04/29/2016   Procedure: OPEN REDUCTION INTERNAL FIXATION (ORIF) DISTAL RADIAL FRACTURE;  Surgeon: Roseanne Kaufman, MD;  Location: [facility];  Service: Orthopedics;  Laterality: Right;  . ORIF DISTAL RADIUS FRACTURE Right 04/29/2016     Family History  Problem Relation Age of Onset  . Heart attack Mother        Died age 66  . Hyperlipidemia Mother   . Diabetes Mother   . Coronary  artery disease Father   . Alcohol abuse Father   . Hyperlipidemia Father   . Diabetes Sister   . Hypertension Sister   . Breast cancer Sister   . Breast cancer Sister      Social History   Social History  . Marital status: Widowed    Spouse name: N/A  . Number of children: N/A  . Years of education: N/A   Occupational History  . Not on file.   Social History Main Topics  . Smoking status: Never Smoker  . Smokeless tobacco: Never Used  . Alcohol use No  . Drug use: No  . Sexual activity: No   Other Topics Concern  . Not on file   Social History Narrative  . No narrative on file     BP 102/62   Pulse 85   Ht 5\' 4"  (1.626 m)   Wt 154 lb (69.9 kg)   SpO2 95%   BMI 26.43 kg/m   Physical Exam:  Well appearing 82 year old woman, NAD HEENT: Unremarkable Neck:  6 cm JVD, no thyromegally Back:  No CVA tenderness Lungs:  Clear with no wheezes, rales, or rhonchi. HEART:  Regular rate rhythm, no murmurs, no rubs, no clicks Abd:  soft, positive bowel sounds, no organomegally, no rebound, no guarding Ext:  2 plus pulses, no edema, no cyanosis, no clubbing Skin:  No rashes no nodules Neuro:  CN II through XII intact, motor grossly intact  DEVICE  Normal device function.  See PaceArt for details.   Assess/Plan: 1. Atrial fib - she will continue her AV nodal blocking drugs for rate control.  2. HTN - I have asked her to reduce her dose of losartan to 25 mg daily down from 50 bid. We might need to stop it completely.  3. PPM - her Medtronic DDD PM is working normally. Will recheck in several months. 4. Depression - she has had much loss. I have asked the patient start walking regularly.   Mikle Bosworth.D.

## 2016-07-31 DIAGNOSIS — S62101D Fracture of unspecified carpal bone, right wrist, subsequent encounter for fracture with routine healing: Secondary | ICD-10-CM | POA: Diagnosis not present

## 2016-08-04 DIAGNOSIS — I10 Essential (primary) hypertension: Secondary | ICD-10-CM | POA: Diagnosis not present

## 2016-08-04 DIAGNOSIS — E1165 Type 2 diabetes mellitus with hyperglycemia: Secondary | ICD-10-CM | POA: Diagnosis not present

## 2016-08-05 LAB — CUP PACEART INCLINIC DEVICE CHECK
Battery Impedance: 1963 Ohm
Brady Statistic RV Percent Paced: 42 %
Implantable Lead Implant Date: 20060525
Implantable Lead Location: 753860
Implantable Lead Model: 5076
Lead Channel Pacing Threshold Amplitude: 0.75 V
Lead Channel Pacing Threshold Pulse Width: 0.4 ms
Lead Channel Sensing Intrinsic Amplitude: 0.7 mV
Lead Channel Sensing Intrinsic Amplitude: 15.67 mV
Lead Channel Setting Pacing Pulse Width: 0.4 ms
MDC IDC LEAD IMPLANT DT: 20060525
MDC IDC LEAD LOCATION: 753859
MDC IDC MSMT BATTERY REMAINING LONGEVITY: 30 mo
MDC IDC MSMT BATTERY VOLTAGE: 2.74 V
MDC IDC MSMT LEADCHNL RA IMPEDANCE VALUE: 485 Ohm
MDC IDC MSMT LEADCHNL RV IMPEDANCE VALUE: 625 Ohm
MDC IDC MSMT LEADCHNL RV PACING THRESHOLD AMPLITUDE: 0.75 V
MDC IDC MSMT LEADCHNL RV PACING THRESHOLD PULSEWIDTH: 0.4 ms
MDC IDC PG IMPLANT DT: 20111215
MDC IDC SESS DTM: 20180621141444
MDC IDC SET LEADCHNL RV PACING AMPLITUDE: 2.5 V
MDC IDC SET LEADCHNL RV SENSING SENSITIVITY: 5.6 mV

## 2016-08-06 DIAGNOSIS — E1165 Type 2 diabetes mellitus with hyperglycemia: Secondary | ICD-10-CM | POA: Diagnosis not present

## 2016-08-06 DIAGNOSIS — I10 Essential (primary) hypertension: Secondary | ICD-10-CM | POA: Diagnosis not present

## 2016-08-06 DIAGNOSIS — E782 Mixed hyperlipidemia: Secondary | ICD-10-CM | POA: Diagnosis not present

## 2016-08-06 DIAGNOSIS — F331 Major depressive disorder, recurrent, moderate: Secondary | ICD-10-CM | POA: Diagnosis not present

## 2016-08-06 DIAGNOSIS — I482 Chronic atrial fibrillation: Secondary | ICD-10-CM | POA: Diagnosis not present

## 2016-08-18 DIAGNOSIS — Z6827 Body mass index (BMI) 27.0-27.9, adult: Secondary | ICD-10-CM | POA: Diagnosis not present

## 2016-08-18 DIAGNOSIS — N390 Urinary tract infection, site not specified: Secondary | ICD-10-CM | POA: Diagnosis not present

## 2016-08-18 DIAGNOSIS — E1165 Type 2 diabetes mellitus with hyperglycemia: Secondary | ICD-10-CM | POA: Diagnosis not present

## 2016-08-20 DIAGNOSIS — N390 Urinary tract infection, site not specified: Secondary | ICD-10-CM | POA: Diagnosis not present

## 2016-08-20 DIAGNOSIS — I482 Chronic atrial fibrillation: Secondary | ICD-10-CM | POA: Diagnosis not present

## 2016-08-21 ENCOUNTER — Telehealth: Payer: Self-pay | Admitting: Internal Medicine

## 2016-08-21 MED ORDER — LOSARTAN POTASSIUM 50 MG PO TABS: 50.0000 mg | ORAL_TABLET | Freq: Two times a day (BID) | ORAL | 2 refills | Status: AC

## 2016-08-21 MED ORDER — METOPROLOL TARTRATE 25 MG PO TABS: 50.0000 mg | ORAL_TABLET | Freq: Two times a day (BID) | ORAL | 1 refills | Status: DC

## 2016-08-21 NOTE — Telephone Encounter (Signed)
Patient's daughter calling and state that the patient's BP has been elevated since Monday at 180s/100s with HR in the 1 teens. She states that the patient was diagnosed with a UTI and was started on macrobid Monday. She states that the patient had been taking losartan 50 mg BID since before the patient was diagnosed with the UTI. She states that the patientt was seen by PCP yesterday who increased the patient's metoprolol to 50 mg BID. Patient is also taking cardizem 240 mg QD. She states that the patient's BP yesterday was 164/99 and today is 184/103 with HR 117. She states that the patient is not having any vision changes, HAs, or any other symptoms other than being tired. She states that the patient does have dementia. Discussed with DOD who recommended that more time be given to allow for the increase in metoprolol and the abx to work. Daughter made aware of recommendations. She verbalized understanding and thanked me for the call. Will forward to Dr. Lovena Le for review. Med list updated.

## 2016-08-21 NOTE — Telephone Encounter (Signed)
New message    Pt daughter is calling.   Pt c/o BP issue: STAT if pt c/o blurred vision, one-sided weakness or slurred speech  1. What are your last 5 BP readings? [phone] p-117  2. Are you having any other symptoms (ex. Dizziness, headache, blurred vision, passed out)? No, very tired. Pt was diagnosed with UTI per daughter.   3. What is your BP issue? Pt BP is running high. Daughter states the PCP told her to give extra metoprolol but it's still high.

## 2016-08-24 NOTE — Telephone Encounter (Signed)
Continue current meds. If her pressures remain high she will need to return for additional evaluation. GT

## 2016-08-26 NOTE — Telephone Encounter (Signed)
Left message for patient's daughter to call back. 

## 2016-08-27 NOTE — Telephone Encounter (Signed)
F/U MESSAGE  PT DAUGHTER RETURNING RN CALL. PLEASE CALL BACK TO DISCUSS

## 2016-08-27 NOTE — Telephone Encounter (Signed)
Called and made daughter aware of Dr. Tanna Furry recommendations. She states that her mother's BP has gotten down to 140s/80s. She says that that they are currently at the beach. Daughter states that she will let us know if she feels like she needs to be seen when they get back.

## 2016-09-08 DIAGNOSIS — Z7901 Long term (current) use of anticoagulants: Secondary | ICD-10-CM | POA: Diagnosis not present

## 2016-09-08 DIAGNOSIS — I482 Chronic atrial fibrillation: Secondary | ICD-10-CM | POA: Diagnosis not present

## 2016-09-08 DIAGNOSIS — I10 Essential (primary) hypertension: Secondary | ICD-10-CM | POA: Diagnosis not present

## 2016-09-08 DIAGNOSIS — Z6827 Body mass index (BMI) 27.0-27.9, adult: Secondary | ICD-10-CM | POA: Diagnosis not present

## 2016-10-06 DIAGNOSIS — I482 Chronic atrial fibrillation: Secondary | ICD-10-CM | POA: Diagnosis not present

## 2016-10-06 DIAGNOSIS — Z6827 Body mass index (BMI) 27.0-27.9, adult: Secondary | ICD-10-CM | POA: Diagnosis not present

## 2016-10-06 DIAGNOSIS — M79671 Pain in right foot: Secondary | ICD-10-CM | POA: Diagnosis not present

## 2016-10-13 DIAGNOSIS — Z7901 Long term (current) use of anticoagulants: Secondary | ICD-10-CM | POA: Diagnosis not present

## 2016-10-13 DIAGNOSIS — Z6827 Body mass index (BMI) 27.0-27.9, adult: Secondary | ICD-10-CM | POA: Diagnosis not present

## 2016-10-16 ENCOUNTER — Telehealth: Payer: Self-pay | Admitting: Cardiology

## 2016-10-16 ENCOUNTER — Encounter: Payer: PPO | Admitting: *Deleted

## 2016-10-16 NOTE — Telephone Encounter (Signed)
Attempted to confirm remote transmission with pt. No answer and was unable to leave a message.   

## 2016-10-17 ENCOUNTER — Encounter: Payer: Self-pay | Admitting: Cardiology

## 2016-10-24 DIAGNOSIS — M79672 Pain in left foot: Secondary | ICD-10-CM | POA: Diagnosis not present

## 2016-10-24 DIAGNOSIS — F331 Major depressive disorder, recurrent, moderate: Secondary | ICD-10-CM | POA: Diagnosis not present

## 2016-10-24 DIAGNOSIS — Z6827 Body mass index (BMI) 27.0-27.9, adult: Secondary | ICD-10-CM | POA: Diagnosis not present

## 2016-11-10 DIAGNOSIS — I10 Essential (primary) hypertension: Secondary | ICD-10-CM | POA: Diagnosis not present

## 2016-11-10 DIAGNOSIS — E782 Mixed hyperlipidemia: Secondary | ICD-10-CM | POA: Diagnosis not present

## 2016-11-10 DIAGNOSIS — R7301 Impaired fasting glucose: Secondary | ICD-10-CM | POA: Diagnosis not present

## 2016-11-10 DIAGNOSIS — E1165 Type 2 diabetes mellitus with hyperglycemia: Secondary | ICD-10-CM | POA: Diagnosis not present

## 2016-11-11 DIAGNOSIS — M792 Neuralgia and neuritis, unspecified: Secondary | ICD-10-CM | POA: Diagnosis not present

## 2016-11-11 DIAGNOSIS — M79672 Pain in left foot: Secondary | ICD-10-CM | POA: Diagnosis not present

## 2016-11-12 DIAGNOSIS — F331 Major depressive disorder, recurrent, moderate: Secondary | ICD-10-CM | POA: Diagnosis not present

## 2016-11-12 DIAGNOSIS — Z6827 Body mass index (BMI) 27.0-27.9, adult: Secondary | ICD-10-CM | POA: Diagnosis not present

## 2016-11-12 DIAGNOSIS — E782 Mixed hyperlipidemia: Secondary | ICD-10-CM | POA: Diagnosis not present

## 2016-11-12 DIAGNOSIS — E1165 Type 2 diabetes mellitus with hyperglycemia: Secondary | ICD-10-CM | POA: Diagnosis not present

## 2016-11-12 DIAGNOSIS — I10 Essential (primary) hypertension: Secondary | ICD-10-CM | POA: Diagnosis not present

## 2016-11-12 DIAGNOSIS — I4891 Unspecified atrial fibrillation: Secondary | ICD-10-CM | POA: Diagnosis not present

## 2016-11-21 DIAGNOSIS — M7750 Other enthesopathy of unspecified foot: Secondary | ICD-10-CM | POA: Diagnosis not present

## 2016-11-21 DIAGNOSIS — M545 Low back pain: Secondary | ICD-10-CM | POA: Diagnosis not present

## 2016-11-21 DIAGNOSIS — M25572 Pain in left ankle and joints of left foot: Secondary | ICD-10-CM | POA: Diagnosis not present

## 2016-11-21 DIAGNOSIS — M9905 Segmental and somatic dysfunction of pelvic region: Secondary | ICD-10-CM | POA: Diagnosis not present

## 2016-11-21 DIAGNOSIS — I10 Essential (primary) hypertension: Secondary | ICD-10-CM | POA: Diagnosis not present

## 2016-11-21 DIAGNOSIS — M79672 Pain in left foot: Secondary | ICD-10-CM | POA: Diagnosis not present

## 2016-11-21 DIAGNOSIS — M9906 Segmental and somatic dysfunction of lower extremity: Secondary | ICD-10-CM | POA: Diagnosis not present

## 2016-11-21 DIAGNOSIS — M9903 Segmental and somatic dysfunction of lumbar region: Secondary | ICD-10-CM | POA: Diagnosis not present

## 2016-11-24 DIAGNOSIS — M25572 Pain in left ankle and joints of left foot: Secondary | ICD-10-CM | POA: Diagnosis not present

## 2016-11-24 DIAGNOSIS — M545 Low back pain: Secondary | ICD-10-CM | POA: Diagnosis not present

## 2016-11-24 DIAGNOSIS — M9906 Segmental and somatic dysfunction of lower extremity: Secondary | ICD-10-CM | POA: Diagnosis not present

## 2016-11-24 DIAGNOSIS — M9903 Segmental and somatic dysfunction of lumbar region: Secondary | ICD-10-CM | POA: Diagnosis not present

## 2016-11-24 DIAGNOSIS — I10 Essential (primary) hypertension: Secondary | ICD-10-CM | POA: Diagnosis not present

## 2016-11-24 DIAGNOSIS — M9905 Segmental and somatic dysfunction of pelvic region: Secondary | ICD-10-CM | POA: Diagnosis not present

## 2016-12-10 DIAGNOSIS — R2681 Unsteadiness on feet: Secondary | ICD-10-CM | POA: Diagnosis not present

## 2016-12-10 DIAGNOSIS — Z7901 Long term (current) use of anticoagulants: Secondary | ICD-10-CM | POA: Diagnosis not present

## 2016-12-10 DIAGNOSIS — I482 Chronic atrial fibrillation: Secondary | ICD-10-CM | POA: Diagnosis not present

## 2016-12-23 DIAGNOSIS — M79672 Pain in left foot: Secondary | ICD-10-CM | POA: Diagnosis not present

## 2017-01-02 DIAGNOSIS — M9905 Segmental and somatic dysfunction of pelvic region: Secondary | ICD-10-CM | POA: Diagnosis not present

## 2017-01-02 DIAGNOSIS — M545 Low back pain: Secondary | ICD-10-CM | POA: Diagnosis not present

## 2017-01-02 DIAGNOSIS — I10 Essential (primary) hypertension: Secondary | ICD-10-CM | POA: Diagnosis not present

## 2017-01-02 DIAGNOSIS — M546 Pain in thoracic spine: Secondary | ICD-10-CM | POA: Diagnosis not present

## 2017-01-02 DIAGNOSIS — M9903 Segmental and somatic dysfunction of lumbar region: Secondary | ICD-10-CM | POA: Diagnosis not present

## 2017-01-02 DIAGNOSIS — M9902 Segmental and somatic dysfunction of thoracic region: Secondary | ICD-10-CM | POA: Diagnosis not present

## 2017-01-13 DIAGNOSIS — I482 Chronic atrial fibrillation: Secondary | ICD-10-CM | POA: Diagnosis not present

## 2017-01-13 DIAGNOSIS — Z7901 Long term (current) use of anticoagulants: Secondary | ICD-10-CM | POA: Diagnosis not present

## 2017-02-11 DIAGNOSIS — I482 Chronic atrial fibrillation: Secondary | ICD-10-CM | POA: Diagnosis not present

## 2017-02-11 DIAGNOSIS — L820 Inflamed seborrheic keratosis: Secondary | ICD-10-CM | POA: Diagnosis not present

## 2017-02-11 DIAGNOSIS — D225 Melanocytic nevi of trunk: Secondary | ICD-10-CM | POA: Diagnosis not present

## 2017-02-11 DIAGNOSIS — D485 Neoplasm of uncertain behavior of skin: Secondary | ICD-10-CM | POA: Diagnosis not present

## 2017-03-02 DIAGNOSIS — D485 Neoplasm of uncertain behavior of skin: Secondary | ICD-10-CM | POA: Diagnosis not present

## 2017-03-02 DIAGNOSIS — L988 Other specified disorders of the skin and subcutaneous tissue: Secondary | ICD-10-CM | POA: Diagnosis not present

## 2017-03-11 DIAGNOSIS — Z7901 Long term (current) use of anticoagulants: Secondary | ICD-10-CM | POA: Diagnosis not present

## 2017-03-11 DIAGNOSIS — Z6838 Body mass index (BMI) 38.0-38.9, adult: Secondary | ICD-10-CM | POA: Diagnosis not present

## 2017-03-11 DIAGNOSIS — I482 Chronic atrial fibrillation: Secondary | ICD-10-CM | POA: Diagnosis not present

## 2017-04-01 DIAGNOSIS — Z7901 Long term (current) use of anticoagulants: Secondary | ICD-10-CM | POA: Diagnosis not present

## 2017-04-01 DIAGNOSIS — I10 Essential (primary) hypertension: Secondary | ICD-10-CM | POA: Diagnosis not present

## 2017-04-01 DIAGNOSIS — E782 Mixed hyperlipidemia: Secondary | ICD-10-CM | POA: Diagnosis not present

## 2017-04-01 DIAGNOSIS — E1165 Type 2 diabetes mellitus with hyperglycemia: Secondary | ICD-10-CM | POA: Diagnosis not present

## 2017-04-01 DIAGNOSIS — R7301 Impaired fasting glucose: Secondary | ICD-10-CM | POA: Diagnosis not present

## 2017-04-01 DIAGNOSIS — I482 Chronic atrial fibrillation: Secondary | ICD-10-CM | POA: Diagnosis not present

## 2017-04-01 DIAGNOSIS — G4709 Other insomnia: Secondary | ICD-10-CM | POA: Diagnosis not present

## 2017-04-01 DIAGNOSIS — F39 Unspecified mood [affective] disorder: Secondary | ICD-10-CM | POA: Diagnosis not present

## 2017-05-20 DIAGNOSIS — I482 Chronic atrial fibrillation: Secondary | ICD-10-CM | POA: Diagnosis not present

## 2017-05-20 DIAGNOSIS — I10 Essential (primary) hypertension: Secondary | ICD-10-CM | POA: Diagnosis not present

## 2017-05-20 DIAGNOSIS — E782 Mixed hyperlipidemia: Secondary | ICD-10-CM | POA: Diagnosis not present

## 2017-05-20 DIAGNOSIS — Z6838 Body mass index (BMI) 38.0-38.9, adult: Secondary | ICD-10-CM | POA: Diagnosis not present

## 2017-05-20 DIAGNOSIS — R7301 Impaired fasting glucose: Secondary | ICD-10-CM | POA: Diagnosis not present

## 2017-05-20 DIAGNOSIS — F39 Unspecified mood [affective] disorder: Secondary | ICD-10-CM | POA: Diagnosis not present

## 2017-05-20 DIAGNOSIS — Z7901 Long term (current) use of anticoagulants: Secondary | ICD-10-CM | POA: Diagnosis not present

## 2017-05-20 DIAGNOSIS — G4709 Other insomnia: Secondary | ICD-10-CM | POA: Diagnosis not present

## 2017-05-20 DIAGNOSIS — E1165 Type 2 diabetes mellitus with hyperglycemia: Secondary | ICD-10-CM | POA: Diagnosis not present

## 2017-05-27 DIAGNOSIS — I10 Essential (primary) hypertension: Secondary | ICD-10-CM | POA: Diagnosis not present

## 2017-05-27 DIAGNOSIS — E1165 Type 2 diabetes mellitus with hyperglycemia: Secondary | ICD-10-CM | POA: Diagnosis not present

## 2017-05-27 DIAGNOSIS — Z6828 Body mass index (BMI) 28.0-28.9, adult: Secondary | ICD-10-CM | POA: Diagnosis not present

## 2017-05-27 DIAGNOSIS — G4709 Other insomnia: Secondary | ICD-10-CM | POA: Diagnosis not present

## 2017-05-27 DIAGNOSIS — I482 Chronic atrial fibrillation: Secondary | ICD-10-CM | POA: Diagnosis not present

## 2017-05-27 DIAGNOSIS — F419 Anxiety disorder, unspecified: Secondary | ICD-10-CM | POA: Diagnosis not present

## 2017-05-27 DIAGNOSIS — E782 Mixed hyperlipidemia: Secondary | ICD-10-CM | POA: Diagnosis not present

## 2017-05-27 DIAGNOSIS — F39 Unspecified mood [affective] disorder: Secondary | ICD-10-CM | POA: Diagnosis not present

## 2017-06-17 DIAGNOSIS — Z7901 Long term (current) use of anticoagulants: Secondary | ICD-10-CM | POA: Diagnosis not present

## 2017-06-17 DIAGNOSIS — E1165 Type 2 diabetes mellitus with hyperglycemia: Secondary | ICD-10-CM | POA: Diagnosis not present

## 2017-06-17 DIAGNOSIS — I482 Chronic atrial fibrillation: Secondary | ICD-10-CM | POA: Diagnosis not present

## 2017-06-17 DIAGNOSIS — I10 Essential (primary) hypertension: Secondary | ICD-10-CM | POA: Diagnosis not present

## 2017-06-17 DIAGNOSIS — R2689 Other abnormalities of gait and mobility: Secondary | ICD-10-CM | POA: Diagnosis not present

## 2017-06-17 DIAGNOSIS — E782 Mixed hyperlipidemia: Secondary | ICD-10-CM | POA: Diagnosis not present

## 2017-06-17 DIAGNOSIS — F331 Major depressive disorder, recurrent, moderate: Secondary | ICD-10-CM | POA: Diagnosis not present

## 2017-06-17 DIAGNOSIS — R7301 Impaired fasting glucose: Secondary | ICD-10-CM | POA: Diagnosis not present

## 2017-07-02 DIAGNOSIS — J060 Acute laryngopharyngitis: Secondary | ICD-10-CM | POA: Diagnosis not present

## 2017-07-02 DIAGNOSIS — Z7901 Long term (current) use of anticoagulants: Secondary | ICD-10-CM | POA: Diagnosis not present

## 2017-07-02 DIAGNOSIS — Z6828 Body mass index (BMI) 28.0-28.9, adult: Secondary | ICD-10-CM | POA: Diagnosis not present

## 2017-07-02 DIAGNOSIS — F419 Anxiety disorder, unspecified: Secondary | ICD-10-CM | POA: Diagnosis not present

## 2017-07-02 DIAGNOSIS — F39 Unspecified mood [affective] disorder: Secondary | ICD-10-CM | POA: Diagnosis not present

## 2017-07-02 DIAGNOSIS — E782 Mixed hyperlipidemia: Secondary | ICD-10-CM | POA: Diagnosis not present

## 2017-07-02 DIAGNOSIS — Z6829 Body mass index (BMI) 29.0-29.9, adult: Secondary | ICD-10-CM | POA: Diagnosis not present

## 2017-07-02 DIAGNOSIS — I10 Essential (primary) hypertension: Secondary | ICD-10-CM | POA: Diagnosis not present

## 2017-07-02 DIAGNOSIS — G4709 Other insomnia: Secondary | ICD-10-CM | POA: Diagnosis not present

## 2017-07-02 DIAGNOSIS — R7301 Impaired fasting glucose: Secondary | ICD-10-CM | POA: Diagnosis not present

## 2017-07-02 DIAGNOSIS — E1165 Type 2 diabetes mellitus with hyperglycemia: Secondary | ICD-10-CM | POA: Diagnosis not present

## 2017-07-02 DIAGNOSIS — I482 Chronic atrial fibrillation: Secondary | ICD-10-CM | POA: Diagnosis not present

## 2017-07-15 DIAGNOSIS — Z7901 Long term (current) use of anticoagulants: Secondary | ICD-10-CM | POA: Diagnosis not present

## 2017-10-12 ENCOUNTER — Ambulatory Visit (INDEPENDENT_AMBULATORY_CARE_PROVIDER_SITE_OTHER): Payer: PPO | Admitting: *Deleted

## 2017-10-12 DIAGNOSIS — I495 Sick sinus syndrome: Secondary | ICD-10-CM

## 2017-10-12 DIAGNOSIS — I482 Chronic atrial fibrillation, unspecified: Secondary | ICD-10-CM

## 2017-10-13 NOTE — Progress Notes (Signed)
Remote pacemaker transmission.   

## 2017-10-14 ENCOUNTER — Ambulatory Visit (INDEPENDENT_AMBULATORY_CARE_PROVIDER_SITE_OTHER): Payer: PPO | Admitting: Internal Medicine

## 2017-10-14 ENCOUNTER — Encounter: Payer: Self-pay | Admitting: Internal Medicine

## 2017-10-14 VITALS — BP 112/78 | HR 76 | Ht 65.0 in | Wt 173.8 lb

## 2017-10-14 DIAGNOSIS — I482 Chronic atrial fibrillation, unspecified: Secondary | ICD-10-CM

## 2017-10-14 DIAGNOSIS — Z950 Presence of cardiac pacemaker: Secondary | ICD-10-CM

## 2017-10-14 DIAGNOSIS — I10 Essential (primary) hypertension: Secondary | ICD-10-CM

## 2017-10-14 DIAGNOSIS — I495 Sick sinus syndrome: Secondary | ICD-10-CM | POA: Diagnosis not present

## 2017-10-14 NOTE — Progress Notes (Addendum)
HPI [name] returns today for followup of her PPM. She has chronic atrial fib, and symptomatic bradycardia and is s/p PPM insertion. In the interim, she has lost her husband of 79 years who developed CA and her daughter. She denies trouble with sleeping and her appetite is good. She has dementia which appears mild. No edema. Allergies  Allergen Reactions  . Morphine Nausea Only  . Penicillins Other (See Comments)    Has patient had a PCN reaction causing immediate rash, facial/tongue/throat swelling, SOB or lightheadedness with hypotension: NO Has patient had a PCN reaction causing severe rash involving mucus membranes or skin necrosis: no Has patient had a PCN reaction that required hospitalization: NO Has patient had a PCN reaction occurring within the last 10 years: NO If all of the above answers are "NO", then may proceed with Cephalosporin use.      Current Outpatient Medications  Medication Sig Dispense Refill  . diltiazem (CARDIZEM CD) 240 MG 24 hr capsule Take 240 mg by mouth daily.    Marland Kitchen glipiZIDE (GLUCOTROL XL) 10 MG 24 hr tablet Take 1 tablet by mouth daily.  1  . losartan (COZAAR) 50 MG tablet Take 1 tablet (50 mg total) by mouth 2 (two) times daily. In the morning 180 tablet 2  . metoprolol tartrate (LOPRESSOR) 25 MG tablet Take 2 tablets (50 mg total) by mouth 2 (two) times daily. 60 tablet 1  . pravastatin (PRAVACHOL) 20 MG tablet Take 1 tablet by mouth at bedtime.  5  . venlafaxine (EFFEXOR) 100 MG tablet Take 1 tablet by mouth daily.  0  . warfarin (COUMADIN) 5 MG tablet Take 2.5-5 mg by mouth daily. Take 2.5 mg on Mon / Tue / Thurs / Sat / Sun Take 5 mg on Wed / Fri     No current facility-administered medications for this visit.      Past Medical History:  Diagnosis Date  . Arthritis   . Atrial fibrillation ([place])   . Carotid artery disease ([place])    Right carotid endarectomy 1999  . Chronic anticoagulation    Followed by PMD  . Coronary  atherosclerosis    a. Minor at cardiac catheterization 2005 b. cath 10/16/2014 40% prox LAD dx, otherwise minimal CAD  . Depression   . Essential hypertension   . Glucose intolerance (impaired glucose tolerance)   . History of kidney stones   . Hyperlipidemia   . Pneumonia 2010  . Sick sinus syndrome ([facility])    Medtronic PPM    ROS:   All systems reviewed and negative except as noted in the HPI.   Past Surgical History:  Procedure Laterality Date  . ABDOMINAL HYSTERECTOMY    . APPENDECTOMY    . BREAST BIOPSY Bilateral    x 7 total  . CARDIAC CATHETERIZATION N/A 10/16/2014   Procedure: Left Heart Cath and Coronary Angiography;  Surgeon: Leonie Man, MD;  Location: [facility];  Service: Cardiovascular;  Laterality: N/A;  . CAROTID ENDARTERECTOMY Right 1999  . CATARACT EXTRACTION W/ INTRAOCULAR LENS  IMPLANT, BILATERAL Bilateral   . COLONOSCOPY     2006  . CYSTOSCOPY W/ URETERAL STENT PLACEMENT Left 04/19/2012   Procedure: CYSTOSCOPY WITH RETROGRADE PYELOGRAM/URETERAL STENT PLACEMENT ;  Surgeon: Ailene Rud, MD;  Location: [facility];  Service: Urology;  Laterality: Left;  . CYSTOSCOPY WITH RETROGRADE PYELOGRAM, URETEROSCOPY AND STENT PLACEMENT Left 06/30/2012   Procedure: CYSTOSCOPY WITH LEFT  RETROGRADE PYELOGRAM, URETEROSCOPY  with basketing  of stone, AND STENT PLACEMENT, TRANSURETHRAL UNROOFING OF URETER.;  Surgeon: Alexis Frock, MD;  Location: [facility];  Service: Urology;  Laterality: Left;  . INSERT / REPLACE / REMOVE PACEMAKER  2006  . OPEN REDUCTION INTERNAL FIXATION (ORIF) DISTAL RADIAL FRACTURE Right 04/29/2016   Procedure: OPEN REDUCTION INTERNAL FIXATION (ORIF) DISTAL RADIAL FRACTURE;  Surgeon: Roseanne Kaufman, MD;  Location: [facility];  Service: Orthopedics;  Laterality: Right;  . ORIF DISTAL RADIUS FRACTURE Right 04/29/2016     Family History  Problem Relation Age of Onset  . Heart attack Mother        Died age 73  . Hyperlipidemia Mother   . Diabetes  Mother   . Coronary artery disease Father   . Alcohol abuse Father   . Hyperlipidemia Father   . Diabetes Sister   . Hypertension Sister   . Breast cancer Sister   . Breast cancer Sister      Social History   Socioeconomic History  . Marital status: Widowed    Spouse name: Not on file  . Number of children: Not on file  . Years of education: Not on file  . Highest education level: Not on file  Occupational History  . Not on file  Social Needs  . Financial resource strain: Not on file  . Food insecurity:    Worry: Not on file    Inability: Not on file  . Transportation needs:    Medical: Not on file    Non-medical: Not on file  Tobacco Use  . Smoking status: Never Smoker  . Smokeless tobacco: Never Used  Substance and Sexual Activity  . Alcohol use: No    Alcohol/week: 0.0 standard drinks  . Drug use: No  . Sexual activity: Never  Lifestyle  . Physical activity:    Days per week: Not on file    Minutes per session: Not on file  . Stress: Not on file  Relationships  . Social connections:    Talks on phone: Not on file    Gets together: Not on file    Attends religious service: Not on file    Active member of club or organization: Not on file    Attends meetings of clubs or organizations: Not on file    Relationship status: Not on file  . Intimate partner violence:    Fear of current or ex partner: Not on file    Emotionally abused: Not on file    Physically abused: Not on file    Forced sexual activity: Not on file  Other Topics Concern  . Not on file  Social History Narrative  . Not on file     BP 112/78   Pulse 76   Ht 5\' 5"  (1.651 m)   Wt 173 lb 12.8 oz (78.8 kg)   SpO2 98%   BMI 28.92 kg/m   Physical Exam:  Well appearing NAD HEENT: Unremarkable Neck:  No JVD, no thyromegally Lymphatics:  No adenopathy Back:  No CVA tenderness Lungs:  Clear with no wheezes HEART:  Regular rate rhythm, no murmurs, no rubs, no clicks Abd:  soft, positive  bowel sounds, no organomegally, no rebound, no guarding Ext:  2 plus pulses, no edema, no cyanosis, no clubbing Skin:  No rashes no nodules Neuro:  CN II through XII intact, motor grossly intact  ECG - atrial fib with ventricular pacing  DEVICE  Normal device function.  See PaceArt for details.   Assess/Plan: 1. Atrial fib - her  rates are well controlled. She will continue her beta blocker and warfarin 2. HTN - her blood pressure is well controlled today. She will continue her current meds. 3. PPM - her medtronic DDD PM is programmed VVIR. She will followup in several months.  Cristopher Peru, M.D.

## 2017-10-14 NOTE — Patient Instructions (Signed)
Medication Instructions:  Your physician recommends that you continue on your current medications as directed. Please refer to the Current Medication list given to you today.  Labwork: None ordered.  Testing/Procedures: None ordered.  Follow-Up: Your physician wants you to follow-up in: one year with Dr. Lovena Le.   You will receive a reminder letter in the mail two months in advance. If you don't receive a letter, please call our office to schedule the follow-up appointment.  Remote monitoring is used to monitor your Pacemaker from home. This monitoring reduces the number of office visits required to check your device to one time per year. It allows [place] to keep an eye on the functioning of your device to ensure it is working properly. You are scheduled for a device check from home on 01/11/2018. You may send your transmission at any time that day. If you have a wireless device, the transmission will be sent automatically. After your physician reviews your transmission, you will receive a postcard with your next transmission date.  Any Other Special Instructions Will Be Listed Below (If Applicable).  If you need a refill on your cardiac medications before your next appointment, please call your pharmacy.

## 2017-10-27 DIAGNOSIS — I482 Chronic atrial fibrillation, unspecified: Secondary | ICD-10-CM | POA: Diagnosis not present

## 2017-11-04 LAB — CUP PACEART REMOTE DEVICE CHECK
Battery Impedance: 2450 Ohm
Battery Voltage: 2.74 V
Brady Statistic RV Percent Paced: 36 %
Implantable Lead Implant Date: 20060525
Implantable Lead Location: 753859
Implantable Lead Model: 5076
Implantable Pulse Generator Implant Date: 20111215
Lead Channel Impedance Value: 67 Ohm
Lead Channel Pacing Threshold Pulse Width: 0.4 ms
Lead Channel Setting Pacing Pulse Width: 0.4 ms
Lead Channel Setting Sensing Sensitivity: 5.6 mV
MDC IDC LEAD IMPLANT DT: 20060525
MDC IDC LEAD LOCATION: 753860
MDC IDC MSMT BATTERY REMAINING LONGEVITY: 29 mo
MDC IDC MSMT LEADCHNL RV IMPEDANCE VALUE: 590 Ohm
MDC IDC MSMT LEADCHNL RV PACING THRESHOLD AMPLITUDE: 0.75 V
MDC IDC SESS DTM: 20190916194056
MDC IDC SET LEADCHNL RV PACING AMPLITUDE: 2.5 V

## 2017-11-23 DIAGNOSIS — N390 Urinary tract infection, site not specified: Secondary | ICD-10-CM | POA: Diagnosis not present

## 2017-11-24 ENCOUNTER — Other Ambulatory Visit: Payer: Self-pay

## 2017-11-24 ENCOUNTER — Encounter (HOSPITAL_COMMUNITY): Payer: Self-pay

## 2017-11-24 ENCOUNTER — Emergency Department (HOSPITAL_COMMUNITY): Payer: PPO

## 2017-11-24 ENCOUNTER — Emergency Department (HOSPITAL_COMMUNITY)
Admission: EM | Admit: 2017-11-24 | Discharge: 2017-11-24 | Disposition: A | Discharge status: Home / Self Care | Payer: PPO | Attending: Emergency Medicine | Admitting: Emergency Medicine

## 2017-11-24 DIAGNOSIS — Z7902 Long term (current) use of antithrombotics/antiplatelets: Secondary | ICD-10-CM | POA: Insufficient documentation

## 2017-11-24 DIAGNOSIS — S0083XA Contusion of other part of head, initial encounter: Secondary | ICD-10-CM | POA: Diagnosis not present

## 2017-11-24 DIAGNOSIS — Y9384 Activity, sleeping: Secondary | ICD-10-CM | POA: Diagnosis not present

## 2017-11-24 DIAGNOSIS — Y998 Other external cause status: Secondary | ICD-10-CM | POA: Diagnosis not present

## 2017-11-24 DIAGNOSIS — Z79899 Other long term (current) drug therapy: Secondary | ICD-10-CM | POA: Insufficient documentation

## 2017-11-24 DIAGNOSIS — Z7984 Long term (current) use of oral hypoglycemic drugs: Secondary | ICD-10-CM | POA: Insufficient documentation

## 2017-11-24 DIAGNOSIS — I5032 Chronic diastolic (congestive) heart failure: Secondary | ICD-10-CM | POA: Insufficient documentation

## 2017-11-24 DIAGNOSIS — E1140 Type 2 diabetes mellitus with diabetic neuropathy, unspecified: Secondary | ICD-10-CM | POA: Insufficient documentation

## 2017-11-24 DIAGNOSIS — Z7901 Long term (current) use of anticoagulants: Secondary | ICD-10-CM | POA: Diagnosis not present

## 2017-11-24 DIAGNOSIS — W19XXXA Unspecified fall, initial encounter: Secondary | ICD-10-CM

## 2017-11-24 DIAGNOSIS — R0602 Shortness of breath: Secondary | ICD-10-CM | POA: Diagnosis not present

## 2017-11-24 DIAGNOSIS — S0003XA Contusion of scalp, initial encounter: Secondary | ICD-10-CM | POA: Diagnosis not present

## 2017-11-24 DIAGNOSIS — W06XXXA Fall from bed, initial encounter: Secondary | ICD-10-CM | POA: Diagnosis not present

## 2017-11-24 DIAGNOSIS — I2510 Atherosclerotic heart disease of native coronary artery without angina pectoris: Secondary | ICD-10-CM | POA: Diagnosis not present

## 2017-11-24 DIAGNOSIS — Y92013 Bedroom of single-family (private) house as the place of occurrence of the external cause: Secondary | ICD-10-CM | POA: Insufficient documentation

## 2017-11-24 DIAGNOSIS — S0990XA Unspecified injury of head, initial encounter: Secondary | ICD-10-CM | POA: Diagnosis present

## 2017-11-24 DIAGNOSIS — Z950 Presence of cardiac pacemaker: Secondary | ICD-10-CM | POA: Diagnosis not present

## 2017-11-24 DIAGNOSIS — J90 Pleural effusion, not elsewhere classified: Secondary | ICD-10-CM | POA: Diagnosis not present

## 2017-11-24 DIAGNOSIS — I110 Hypertensive heart disease with heart failure: Secondary | ICD-10-CM | POA: Diagnosis not present

## 2017-11-24 DIAGNOSIS — S3993XA Unspecified injury of pelvis, initial encounter: Secondary | ICD-10-CM | POA: Diagnosis not present

## 2017-11-24 DIAGNOSIS — S199XXA Unspecified injury of neck, initial encounter: Secondary | ICD-10-CM | POA: Diagnosis not present

## 2017-11-24 LAB — CBC WITH DIFFERENTIAL/PLATELET
ABS IMMATURE GRANULOCYTES: 0.04 10*3/uL (ref 0.00–0.07)
Basophils Absolute: 0.1 10*3/uL (ref 0.0–0.1)
Basophils Relative: 1 %
Eosinophils Absolute: 0.2 10*3/uL (ref 0.0–0.5)
Eosinophils Relative: 2 %
HCT: 43.7 % (ref 36.0–46.0)
Hemoglobin: 14.3 g/dL (ref 12.0–15.0)
Immature Granulocytes: 0 %
Lymphocytes Relative: 24 %
Lymphs Abs: 3.1 10*3/uL (ref 0.7–4.0)
MCH: 31.1 pg (ref 26.0–34.0)
MCHC: 32.7 g/dL (ref 30.0–36.0)
MCV: 95 fL (ref 80.0–100.0)
MONO ABS: 1 10*3/uL (ref 0.1–1.0)
MONOS PCT: 8 %
NEUTROS ABS: 8.4 10*3/uL — AB (ref 1.7–7.7)
Neutrophils Relative %: 65 %
Platelets: 297 10*3/uL (ref 150–400)
RBC: 4.6 MIL/uL (ref 3.87–5.11)
RDW: 13.5 % (ref 11.5–15.5)
WBC: 12.8 10*3/uL — ABNORMAL HIGH (ref 4.0–10.5)
nRBC: 0 % (ref 0.0–0.2)

## 2017-11-24 LAB — COMPREHENSIVE METABOLIC PANEL
ALBUMIN: 3.6 g/dL (ref 3.5–5.0)
ALT: 16 U/L (ref 0–44)
AST: 20 U/L (ref 15–41)
Alkaline Phosphatase: 100 U/L (ref 38–126)
Anion gap: 9 (ref 5–15)
BUN: 13 mg/dL (ref 8–23)
CHLORIDE: 103 mmol/L (ref 98–111)
CO2: 25 mmol/L (ref 22–32)
Calcium: 9.8 mg/dL (ref 8.9–10.3)
Creatinine, Ser: 0.93 mg/dL (ref 0.44–1.00)
GFR calc Af Amer: >60 mL/min (ref >60)
GFR calc non Af Amer: 56 mL/min — ABNORMAL LOW (ref >60)
Glucose, Bld: 167 mg/dL — ABNORMAL HIGH (ref 70–99)
POTASSIUM: 3.8 mmol/L (ref 3.5–5.1)
Sodium: 137 mmol/L (ref 135–145)
Total Bilirubin: 0.6 mg/dL (ref 0.3–1.2)
Total Protein: 7.4 g/dL (ref 6.5–8.1)

## 2017-11-24 LAB — URINALYSIS, ROUTINE W REFLEX MICROSCOPIC
Bilirubin Urine: NEGATIVE
Glucose, UA: NEGATIVE mg/dL
KETONES UR: NEGATIVE mg/dL
Leukocytes, UA: NEGATIVE
Nitrite: NEGATIVE
PROTEIN: 100 mg/dL — AB
RBC / HPF: >50 RBC/hpf — ABNORMAL HIGH (ref 0–5)
Specific Gravity, Urine: 1.009 (ref 1.005–1.030)
pH: 6 (ref 5.0–8.0)

## 2017-11-24 LAB — PROTIME-INR
INR: 3.77
Prothrombin Time: 36.6 seconds — ABNORMAL HIGH (ref 11.4–15.2)

## 2017-11-24 MED ORDER — IPRATROPIUM-ALBUTEROL 0.5-2.5 (3) MG/3ML IN SOLN
3.0000 mL | Freq: Once | RESPIRATORY_TRACT | Status: AC
  Administered 2017-11-24: 3 mL via RESPIRATORY_TRACT
  Filled 2017-11-24: qty 3

## 2017-11-24 MED ORDER — ACETAMINOPHEN 500 MG PO TABS
1000.0000 mg | ORAL_TABLET | Freq: Once | ORAL | Status: AC
  Administered 2017-11-24: 1000 mg via ORAL
  Filled 2017-11-24: qty 2

## 2017-11-24 NOTE — ED Notes (Signed)
Bruising to forehead, edema, complaining of headache and eye pain.

## 2017-11-24 NOTE — ED Notes (Signed)
Pt back from xray and CT, Family at the bedside

## 2017-11-24 NOTE — ED Provider Notes (Signed)
[facility] EMERGENCY DEPARTMENT Provider Note   CSN: [id number] Arrival date & time: 11/24/17  1629     History   Chief Complaint Chief Complaint  Patient presents with  . Fall    HPI Traci [name] is a 83 y.o. female.  Pt presents to the ED today after a fall.  The pt fell off the bed this morning and hit her head on the night stand.  The pt has a lot of swelling and bruising to her forehead.  She denies any other injuries.  She did not have a loc.  She is on coumadin for a.fib.  Her daughter said she's had some wheezing and has a UTI that is getting treated with bactrim.    CHA2DS2/VAS Stroke Risk Points  Current as of 6 minutes ago     9 >= 2 Points: High Risk  1 - 1.99 Points: Medium Risk  0 Points: Low Risk    This is the only CHA2DS2/VAS Stroke Risk Points available for the past  year.:  Last Change: N/A     Details    This score determines the patient's risk of having a stroke if the  patient has atrial fibrillation.       Points Metrics  1 Has Congestive Heart Failure:  Yes    Current as of 6 minutes ago  1 Has Vascular Disease:  Yes    Current as of 6 minutes ago  1 Has Hypertension:  Yes    Current as of 6 minutes ago  2 Age:  28    Current as of 6 minutes ago  1 Has Diabetes:  Yes    Current as of 6 minutes ago  2 Had Stroke:  Yes  Had TIA:  No  Had thromboembolism:  No    Current as of 6 minutes ago  1 Female:  Yes    Current as of 6 minutes ago              Past Medical History:  Diagnosis Date  . Arthritis   . Atrial fibrillation ([place])   . Carotid artery disease ([place])    Right carotid endarectomy 1999  . Chronic anticoagulation    Followed by PMD  . Coronary atherosclerosis    a. Minor at cardiac catheterization 2005 b. cath 10/16/2014 40% prox LAD dx, otherwise minimal CAD  . Depression   . Essential hypertension   . Glucose intolerance (impaired glucose tolerance)   . History of kidney stones   . Hyperlipidemia   . Pneumonia 2010   . Sick sinus syndrome [facility])    Medtronic PPM    Patient Active Problem List   Diagnosis Date Noted  . Dementia ([place]) 05/05/2016  . Right wrist fracture, with routine healing, subsequent encounter 05/01/2016  . Wrist fracture, right 04/29/2016  . Frequent falls 04/29/2016  . DM hyperosmolarity type II ([place]) 04/29/2016  . Diabetes mellitus with complication ([place])   . Physical deconditioning   . Acute blood loss anemia 02/06/2016  . Closed fracture of coccyx ([place]) 02/06/2016  . Female pelvic hematoma 02/02/2016  . Chest pain at rest 10/17/2014  . Chronic diastolic heart failure ([place]) 10/14/2014  . Peripheral neuropathy 08/31/2013  . Leg swelling 08/31/2013  . CAD (coronary artery disease) 04/06/2013  . OA (osteoarthritis) 04/06/2013  . Other malaise and fatigue 04/06/2013  . Essential hypertension, benign 04/06/2013  . Atrial fibrillation ([place]) 06/11/2010  . HYPERLIPIDEMIA 02/26/2010  . Cardiac pacemaker in situ 06/13/2009  Past Surgical History:  Procedure Laterality Date  . ABDOMINAL HYSTERECTOMY    . APPENDECTOMY    . BREAST BIOPSY Bilateral    x 7 total  . CARDIAC CATHETERIZATION N/A 10/16/2014   Procedure: Left Heart Cath and Coronary Angiography;  Surgeon: Leonie Man, MD;  Location: [facility];  Service: Cardiovascular;  Laterality: N/A;  . CAROTID ENDARTERECTOMY Right 1999  . CATARACT EXTRACTION W/ INTRAOCULAR LENS  IMPLANT, BILATERAL Bilateral   . COLONOSCOPY     2006  . CYSTOSCOPY W/ URETERAL STENT PLACEMENT Left 04/19/2012   Procedure: CYSTOSCOPY WITH RETROGRADE PYELOGRAM/URETERAL STENT PLACEMENT ;  Surgeon: Ailene Rud, MD;  [facility];  Service: Urology;  Laterality: Left;  . CYSTOSCOPY WITH RETROGRADE PYELOGRAM, URETEROSCOPY AND STENT PLACEMENT Left 06/30/2012   Procedure: CYSTOSCOPY WITH LEFT  RETROGRADE PYELOGRAM, URETEROSCOPY  with basketing of stone, AND STENT PLACEMENT, TRANSURETHRAL UNROOFING OF URETER.;  Surgeon: Alexis Frock,  MD;  Location: [facility];  Service: Urology;  Laterality: Left;  . INSERT / REPLACE / REMOVE PACEMAKER  2006  . OPEN REDUCTION INTERNAL FIXATION (ORIF) DISTAL RADIAL FRACTURE Right 04/29/2016   Procedure: OPEN REDUCTION INTERNAL FIXATION (ORIF) DISTAL RADIAL FRACTURE;  Surgeon: Roseanne Kaufman, MD;  Location: [facility];  Service: Orthopedics;  Laterality: Right;  . ORIF DISTAL RADIUS FRACTURE Right 04/29/2016     OB History   None      Home Medications    Prior to Admission medications   Medication Sig Start Date End Date Taking? Authorizing Provider  diltiazem (CARDIZEM CD) 240 MG 24 hr capsule Take 240 mg by mouth every morning.    Yes [provider]  glipiZIDE (GLUCOTROL XL) 10 MG 24 hr tablet Take 1 tablet by mouth every morning.  08/22/17  Yes [provider]  losartan (COZAAR) 50 MG tablet Take 1 tablet (50 mg total) by mouth 2 (two) times daily. In the morning 08/21/16  Yes Evans Lance, MD  metoprolol tartrate (LOPRESSOR) 50 MG tablet Take 50 mg by mouth 2 (two) times daily. 11/19/17  Yes [provider]  pravastatin (PRAVACHOL) 20 MG tablet Take 1 tablet by mouth at bedtime. 07/31/17  Yes [provider]  sulfamethoxazole-trimethoprim (BACTRIM DS,SEPTRA DS) 800-160 MG tablet Take 1 tablet by mouth 2 (two) times daily. 7 day course starting on 11/23/2017 11/23/17  Yes [provider]  venlafaxine (EFFEXOR) 100 MG tablet Take 50-100 mg by mouth daily. Alternate taking 50mg  daily for 2 days, then take 100mg  for 1 day, then repeat (50mg , 50mg , 100mg , 50mg , etc) 09/18/17  Yes [provider]  warfarin (COUMADIN) 5 MG tablet Take 2.5-5 mg by mouth daily. Take 2.5 mg on Mon / Tue / Thurs / Sat / Sun Take 5 mg on Wed / Fri   Yes [provider]    Family History Family History  Problem Relation Age of Onset  . Heart attack Mother        Died age 70  . Hyperlipidemia Mother   . Diabetes Mother   . Coronary artery disease Father     . Alcohol abuse Father   . Hyperlipidemia Father   . Diabetes Sister   . Hypertension Sister   . Breast cancer Sister   . Breast cancer Sister     Social History Social History   Tobacco Use  . Smoking status: Never Smoker  . Smokeless tobacco: Never Used  Substance Use Topics  . Alcohol use: No    Alcohol/week: 0.0  standard drinks  . Drug use: No     Allergies   Morphine and Penicillins   Review of Systems Review of Systems  Respiratory: Positive for shortness of breath and wheezing.   Neurological: Positive for headaches.  All other systems reviewed and are negative.    Physical Exam Updated Vital Signs BP (!) 192/109   Pulse 76   Temp 98.3 F (36.8 C) (Oral)   Resp (!) 23   Wt 74.8 kg   SpO2 96%   BMI 27.46 kg/m   Physical Exam  Constitutional: She is oriented to person, place, and time. She appears well-developed and well-nourished.  HENT:  Head:    Right Ear: External ear normal.  Left Ear: External ear normal.  Nose: Nose normal.  Mouth/Throat: Oropharynx is clear and moist.  Eyes: Pupils are equal, round, and reactive to light. Conjunctivae and EOM are normal.  Neck: Normal range of motion. Neck supple.  Cardiovascular: Intact distal pulses. An irregularly irregular rhythm present.  Pulmonary/Chest: Effort normal. She has wheezes.  Abdominal: Soft. Bowel sounds are normal.  Musculoskeletal: Normal range of motion.  Neurological: She is alert and oriented to person, place, and time.  Skin: Skin is warm. Capillary refill takes less than 2 seconds.  Psychiatric: She has a normal mood and affect. Her behavior is normal. Judgment and thought content normal.  Nursing note and vitals reviewed.    ED Treatments / Results  Labs (all labs ordered are listed, but only abnormal results are displayed) Labs Reviewed  COMPREHENSIVE METABOLIC PANEL - Abnormal; Notable for the following components:      Result Value   Glucose, Bld 167 (*)    GFR calc  non Af Amer 56 (*)    All other components within normal limits  CBC WITH DIFFERENTIAL/PLATELET - Abnormal; Notable for the following components:   WBC 12.8 (*)    Neutro Abs 8.4 (*)    All other components within normal limits  URINALYSIS, ROUTINE W REFLEX MICROSCOPIC - Abnormal; Notable for the following components:   APPearance HAZY (*)    Hgb urine dipstick LARGE (*)    Protein, ur 100 (*)    RBC / HPF >50 (*)    Bacteria, UA RARE (*)    All other components within normal limits  PROTIME-INR - Abnormal; Notable for the following components:   Prothrombin Time 36.6 (*)    All other components within normal limits    EKG EKG Interpretation  Date/Time:  Tuesday November 24 2017 17:11:20 EDT Ventricular Rate:  93 PR Interval:    QRS Duration: 92 QT Interval:  394 QTC Calculation: 435 R Axis:   81 Text Interpretation:  Atrial fibrillation Ventricular premature complex Borderline right axis deviation LVH with secondary repolarization abnormality ST depression, consider ischemia, diffuse lds No significant change since last tracing Confirmed by Isla Pence [phone]) on 11/24/2017 5:15:56 PM   Radiology Dg Chest 2 View  Result Date: 11/24/2017 CLINICAL DATA:  Headache EXAM: CHEST - 2 VIEW COMPARISON:  February 07, 2016 FINDINGS: The heart size and mediastinal contours are stable. The heart size is enlarged. Cardiac pacemaker is unchanged. There is a small left pleural effusion. No focal pneumonia or pulmonary edema is identified. The visualized skeletal structures are stable. IMPRESSION: Small left pleural effusion.  Cardiomegaly. Electronically Signed   By: Abelardo Diesel M.D.   On: 11/24/2017 18:25   Dg Pelvis 1-2 Views  Result Date: 11/24/2017 CLINICAL DATA:  Fall EXAM: PELVIS - 1-2 VIEW COMPARISON:  Pelvic radiograph 04/20/2014 FINDINGS: There are old fractures of the right superior and inferior pubic rami. No acute fracture. Both hips are approximated. IMPRESSION: No acute  abnormality Electronically Signed   By: Ulyses Jarred M.D.   On: 11/24/2017 18:27   Ct Head Wo Contrast  Result Date: 11/24/2017 CLINICAL DATA:  Fall with headache EXAM: CT HEAD WITHOUT CONTRAST CT CERVICAL SPINE WITHOUT CONTRAST TECHNIQUE: Multidetector CT imaging of the head and cervical spine was performed following the standard protocol without intravenous contrast. Multiplanar CT image reconstructions of the cervical spine were also generated. COMPARISON:  Head CT 05/20/2016 FINDINGS: CT HEAD FINDINGS Brain: There is no mass, hemorrhage or extra-axial collection. There is generalized atrophy without lobar predilection. There is hypoattenuation of the periventricular white matter, most commonly indicating chronic ischemic microangiopathy. Old left basal ganglia lacunar infarct. Vascular: Atherosclerotic calcification of the internal carotid arteries at the skull base. No abnormal hyperdensity of the major intracranial arteries or dural venous sinuses. Skull: Large left frontal scalp hematoma.  No underlying fracture. Sinuses/Orbits: No fluid levels or advanced mucosal thickening of the visualized paranasal sinuses. No mastoid or middle ear effusion. The orbits are normal. CT CERVICAL SPINE FINDINGS Alignment: No static subluxation. Facets are aligned. Occipital condyles are normally positioned. Skull base and vertebrae: No acute fracture. Soft tissues and spinal canal: No prevertebral fluid or swelling. No visible canal hematoma. Disc levels: Multilevel degenerative disc disease and mild facet hypertrophy. No bony spinal canal stenosis. Upper chest: No pneumothorax, pulmonary nodule or pleural effusion. Other: Normal visualized paraspinal cervical soft tissues. IMPRESSION: 1. Left frontal scalp hematoma without calvarial fracture or acute intracranial abnormality. 2. Chronic ischemic microangiopathy and generalized volume loss. 3. No acute abnormality of the cervical spine. Electronically Signed   By: Ulyses Jarred M.D.   On: 11/24/2017 18:39   Ct Cervical Spine Wo Contrast  Result Date: 11/24/2017 CLINICAL DATA:  Fall with headache EXAM: CT HEAD WITHOUT CONTRAST CT CERVICAL SPINE WITHOUT CONTRAST TECHNIQUE: Multidetector CT imaging of the head and cervical spine was performed following the standard protocol without intravenous contrast. Multiplanar CT image reconstructions of the cervical spine were also generated. COMPARISON:  Head CT 05/20/2016 FINDINGS: CT HEAD FINDINGS Brain: There is no mass, hemorrhage or extra-axial collection. There is generalized atrophy without lobar predilection. There is hypoattenuation of the periventricular white matter, most commonly indicating chronic ischemic microangiopathy. Old left basal ganglia lacunar infarct. Vascular: Atherosclerotic calcification of the internal carotid arteries at the skull base. No abnormal hyperdensity of the major intracranial arteries or dural venous sinuses. Skull: Large left frontal scalp hematoma.  No underlying fracture. Sinuses/Orbits: No fluid levels or advanced mucosal thickening of the visualized paranasal sinuses. No mastoid or middle ear effusion. The orbits are normal. CT CERVICAL SPINE FINDINGS Alignment: No static subluxation. Facets are aligned. Occipital condyles are normally positioned. Skull base and vertebrae: No acute fracture. Soft tissues and spinal canal: No prevertebral fluid or swelling. No visible canal hematoma. Disc levels: Multilevel degenerative disc disease and mild facet hypertrophy. No bony spinal canal stenosis. Upper chest: No pneumothorax, pulmonary nodule or pleural effusion. Other: Normal visualized paraspinal cervical soft tissues. IMPRESSION: 1. Left frontal scalp hematoma without calvarial fracture or acute intracranial abnormality. 2. Chronic ischemic microangiopathy and generalized volume loss. 3. No acute abnormality of the cervical spine. Electronically Signed   By: Ulyses Jarred M.D.   On: 11/24/2017 18:39     Procedures Procedures (including critical care time)  Medications Ordered in ED Medications  ipratropium-albuterol (  DUONEB) 0.5-2.5 (3) MG/3ML nebulizer solution 3 mL (3 mLs Nebulization Given 11/24/17 1731)  acetaminophen (TYLENOL) tablet 1,000 mg (1,000 mg Oral Given 11/24/17 1743)     Initial Impression / Assessment and Plan / ED Course  I have reviewed the triage vital signs and the nursing notes.  Pertinent labs & imaging results that were available during my care of the patient were reviewed by me and considered in my medical decision making (see chart for details).   CT head without any intracranial abn.  CT cervical spine ok.  Small pleural effusion in lungs.  She does have some blood in her urine, but she is getting treated for an UTI.  She does not have any pain in her kidney area.  Pt is instructed to return if worse and to f/u with pcp.  Final Clinical Impressions(s) / ED Diagnoses   Final diagnoses:  Fall, initial encounter  Forehead contusion, initial encounter  Anticoagulated on Coumadin    ED Discharge Orders    None       Isla Pence, MD 11/24/17 [phone]

## 2017-11-24 NOTE — ED Triage Notes (Signed)
Pt fell off the bed this morning and hit her head on the night stand. Pt was told by PCP to ice head. Bruising and swelling to forehead. Pt takes Coumadin

## 2017-11-24 NOTE — ED Notes (Signed)
Pt is was dx with UTI yesterday, had 3rd antibiotic this morning.

## 2017-11-25 DIAGNOSIS — N390 Urinary tract infection, site not specified: Secondary | ICD-10-CM | POA: Diagnosis not present

## 2017-11-25 DIAGNOSIS — Z23 Encounter for immunization: Secondary | ICD-10-CM | POA: Diagnosis not present

## 2017-11-25 DIAGNOSIS — I482 Chronic atrial fibrillation, unspecified: Secondary | ICD-10-CM | POA: Diagnosis not present

## 2017-12-10 ENCOUNTER — Other Ambulatory Visit: Payer: Self-pay

## 2017-12-10 ENCOUNTER — Emergency Department (HOSPITAL_COMMUNITY): Payer: PPO

## 2017-12-10 ENCOUNTER — Inpatient Hospital Stay (HOSPITAL_COMMUNITY)
Admission: EM | Admit: 2017-12-10 | Discharge: 2017-12-16 | DRG: 064 | Disposition: A | Discharge status: Skilled Nursing Facility | Payer: PPO | Attending: Family Medicine | Admitting: Family Medicine

## 2017-12-10 ENCOUNTER — Inpatient Hospital Stay (HOSPITAL_COMMUNITY): Payer: PPO

## 2017-12-10 ENCOUNTER — Encounter (HOSPITAL_COMMUNITY): Payer: Self-pay | Admitting: Emergency Medicine

## 2017-12-10 DIAGNOSIS — E876 Hypokalemia: Secondary | ICD-10-CM

## 2017-12-10 DIAGNOSIS — I509 Heart failure, unspecified: Secondary | ICD-10-CM | POA: Diagnosis not present

## 2017-12-10 DIAGNOSIS — I480 Paroxysmal atrial fibrillation: Secondary | ICD-10-CM | POA: Diagnosis present

## 2017-12-10 DIAGNOSIS — Z9981 Dependence on supplemental oxygen: Secondary | ICD-10-CM | POA: Diagnosis not present

## 2017-12-10 DIAGNOSIS — Z950 Presence of cardiac pacemaker: Secondary | ICD-10-CM | POA: Diagnosis not present

## 2017-12-10 DIAGNOSIS — Z7901 Long term (current) use of anticoagulants: Secondary | ICD-10-CM | POA: Diagnosis not present

## 2017-12-10 DIAGNOSIS — M2550 Pain in unspecified joint: Secondary | ICD-10-CM | POA: Diagnosis not present

## 2017-12-10 DIAGNOSIS — I340 Nonrheumatic mitral (valve) insufficiency: Secondary | ICD-10-CM | POA: Diagnosis not present

## 2017-12-10 DIAGNOSIS — R296 Repeated falls: Secondary | ICD-10-CM | POA: Diagnosis present

## 2017-12-10 DIAGNOSIS — R4781 Slurred speech: Secondary | ICD-10-CM | POA: Diagnosis not present

## 2017-12-10 DIAGNOSIS — I5033 Acute on chronic diastolic (congestive) heart failure: Secondary | ICD-10-CM | POA: Diagnosis not present

## 2017-12-10 DIAGNOSIS — E119 Type 2 diabetes mellitus without complications: Secondary | ICD-10-CM

## 2017-12-10 DIAGNOSIS — I272 Pulmonary hypertension, unspecified: Secondary | ICD-10-CM | POA: Diagnosis not present

## 2017-12-10 DIAGNOSIS — I2510 Atherosclerotic heart disease of native coronary artery without angina pectoris: Secondary | ICD-10-CM | POA: Diagnosis not present

## 2017-12-10 DIAGNOSIS — J181 Lobar pneumonia, unspecified organism: Secondary | ICD-10-CM | POA: Diagnosis not present

## 2017-12-10 DIAGNOSIS — Z66 Do not resuscitate: Secondary | ICD-10-CM | POA: Diagnosis present

## 2017-12-10 DIAGNOSIS — I739 Peripheral vascular disease, unspecified: Secondary | ICD-10-CM | POA: Diagnosis not present

## 2017-12-10 DIAGNOSIS — I610 Nontraumatic intracerebral hemorrhage in hemisphere, subcortical: Principal | ICD-10-CM | POA: Diagnosis present

## 2017-12-10 DIAGNOSIS — Z87442 Personal history of urinary calculi: Secondary | ICD-10-CM

## 2017-12-10 DIAGNOSIS — R0602 Shortness of breath: Secondary | ICD-10-CM | POA: Diagnosis not present

## 2017-12-10 DIAGNOSIS — D72829 Elevated white blood cell count, unspecified: Secondary | ICD-10-CM

## 2017-12-10 DIAGNOSIS — F32A Depression, unspecified: Secondary | ICD-10-CM

## 2017-12-10 DIAGNOSIS — Z885 Allergy status to narcotic agent status: Secondary | ICD-10-CM

## 2017-12-10 DIAGNOSIS — I629 Nontraumatic intracranial hemorrhage, unspecified: Secondary | ICD-10-CM

## 2017-12-10 DIAGNOSIS — R5381 Other malaise: Secondary | ICD-10-CM | POA: Diagnosis not present

## 2017-12-10 DIAGNOSIS — E669 Obesity, unspecified: Secondary | ICD-10-CM | POA: Diagnosis present

## 2017-12-10 DIAGNOSIS — I501 Left ventricular failure: Secondary | ICD-10-CM | POA: Diagnosis not present

## 2017-12-10 DIAGNOSIS — M1990 Unspecified osteoarthritis, unspecified site: Secondary | ICD-10-CM | POA: Diagnosis not present

## 2017-12-10 DIAGNOSIS — I110 Hypertensive heart disease with heart failure: Secondary | ICD-10-CM | POA: Diagnosis not present

## 2017-12-10 DIAGNOSIS — I700 Atherosclerosis of aorta: Secondary | ICD-10-CM | POA: Diagnosis present

## 2017-12-10 DIAGNOSIS — R29704 NIHSS score 4: Secondary | ICD-10-CM | POA: Diagnosis not present

## 2017-12-10 DIAGNOSIS — I495 Sick sinus syndrome: Secondary | ICD-10-CM

## 2017-12-10 DIAGNOSIS — R9389 Abnormal findings on diagnostic imaging of other specified body structures: Secondary | ICD-10-CM | POA: Diagnosis not present

## 2017-12-10 DIAGNOSIS — I482 Chronic atrial fibrillation, unspecified: Secondary | ICD-10-CM | POA: Diagnosis not present

## 2017-12-10 DIAGNOSIS — E785 Hyperlipidemia, unspecified: Secondary | ICD-10-CM | POA: Diagnosis present

## 2017-12-10 DIAGNOSIS — Z6827 Body mass index (BMI) 27.0-27.9, adult: Secondary | ICD-10-CM

## 2017-12-10 DIAGNOSIS — R29810 Facial weakness: Secondary | ICD-10-CM | POA: Diagnosis present

## 2017-12-10 DIAGNOSIS — R0603 Acute respiratory distress: Secondary | ICD-10-CM | POA: Diagnosis not present

## 2017-12-10 DIAGNOSIS — R000 Tachycardia, unspecified: Secondary | ICD-10-CM | POA: Diagnosis not present

## 2017-12-10 DIAGNOSIS — F319 Bipolar disorder, unspecified: Secondary | ICD-10-CM | POA: Diagnosis not present

## 2017-12-10 DIAGNOSIS — Z7401 Bed confinement status: Secondary | ICD-10-CM | POA: Diagnosis not present

## 2017-12-10 DIAGNOSIS — I618 Other nontraumatic intracerebral hemorrhage: Secondary | ICD-10-CM

## 2017-12-10 DIAGNOSIS — I161 Hypertensive emergency: Secondary | ICD-10-CM | POA: Diagnosis present

## 2017-12-10 DIAGNOSIS — G936 Cerebral edema: Secondary | ICD-10-CM | POA: Diagnosis present

## 2017-12-10 DIAGNOSIS — Z833 Family history of diabetes mellitus: Secondary | ICD-10-CM

## 2017-12-10 DIAGNOSIS — E1165 Type 2 diabetes mellitus with hyperglycemia: Secondary | ICD-10-CM | POA: Diagnosis present

## 2017-12-10 DIAGNOSIS — Z8701 Personal history of pneumonia (recurrent): Secondary | ICD-10-CM

## 2017-12-10 DIAGNOSIS — J9811 Atelectasis: Secondary | ICD-10-CM

## 2017-12-10 DIAGNOSIS — Z79899 Other long term (current) drug therapy: Secondary | ICD-10-CM

## 2017-12-10 DIAGNOSIS — I6523 Occlusion and stenosis of bilateral carotid arteries: Secondary | ICD-10-CM | POA: Diagnosis not present

## 2017-12-10 DIAGNOSIS — R2689 Other abnormalities of gait and mobility: Secondary | ICD-10-CM | POA: Diagnosis not present

## 2017-12-10 DIAGNOSIS — R1312 Dysphagia, oropharyngeal phase: Secondary | ICD-10-CM | POA: Diagnosis not present

## 2017-12-10 DIAGNOSIS — G8194 Hemiplegia, unspecified affecting left nondominant side: Secondary | ICD-10-CM | POA: Diagnosis not present

## 2017-12-10 DIAGNOSIS — I10 Essential (primary) hypertension: Secondary | ICD-10-CM | POA: Diagnosis not present

## 2017-12-10 DIAGNOSIS — Z961 Presence of intraocular lens: Secondary | ICD-10-CM | POA: Diagnosis present

## 2017-12-10 DIAGNOSIS — I619 Nontraumatic intracerebral hemorrhage, unspecified: Secondary | ICD-10-CM | POA: Diagnosis present

## 2017-12-10 DIAGNOSIS — Z880 Allergy status to penicillin: Secondary | ICD-10-CM

## 2017-12-10 DIAGNOSIS — W19XXXA Unspecified fall, initial encounter: Secondary | ICD-10-CM | POA: Diagnosis not present

## 2017-12-10 DIAGNOSIS — M6281 Muscle weakness (generalized): Secondary | ICD-10-CM | POA: Diagnosis not present

## 2017-12-10 DIAGNOSIS — I6200 Nontraumatic subdural hemorrhage, unspecified: Secondary | ICD-10-CM | POA: Diagnosis not present

## 2017-12-10 DIAGNOSIS — I5021 Acute systolic (congestive) heart failure: Secondary | ICD-10-CM | POA: Diagnosis not present

## 2017-12-10 DIAGNOSIS — Z9841 Cataract extraction status, right eye: Secondary | ICD-10-CM

## 2017-12-10 DIAGNOSIS — Z90710 Acquired absence of both cervix and uterus: Secondary | ICD-10-CM

## 2017-12-10 DIAGNOSIS — J90 Pleural effusion, not elsewhere classified: Secondary | ICD-10-CM | POA: Diagnosis not present

## 2017-12-10 DIAGNOSIS — Z8249 Family history of ischemic heart disease and other diseases of the circulatory system: Secondary | ICD-10-CM

## 2017-12-10 DIAGNOSIS — R531 Weakness: Secondary | ICD-10-CM | POA: Diagnosis not present

## 2017-12-10 DIAGNOSIS — E1143 Type 2 diabetes mellitus with diabetic autonomic (poly)neuropathy: Secondary | ICD-10-CM | POA: Diagnosis not present

## 2017-12-10 DIAGNOSIS — Z803 Family history of malignant neoplasm of breast: Secondary | ICD-10-CM

## 2017-12-10 DIAGNOSIS — E1151 Type 2 diabetes mellitus with diabetic peripheral angiopathy without gangrene: Secondary | ICD-10-CM | POA: Diagnosis not present

## 2017-12-10 DIAGNOSIS — F05 Delirium due to known physiological condition: Secondary | ICD-10-CM | POA: Diagnosis not present

## 2017-12-10 DIAGNOSIS — F329 Major depressive disorder, single episode, unspecified: Secondary | ICD-10-CM | POA: Diagnosis not present

## 2017-12-10 DIAGNOSIS — E118 Type 2 diabetes mellitus with unspecified complications: Secondary | ICD-10-CM | POA: Diagnosis not present

## 2017-12-10 DIAGNOSIS — I4891 Unspecified atrial fibrillation: Secondary | ICD-10-CM | POA: Diagnosis not present

## 2017-12-10 DIAGNOSIS — R262 Difficulty in walking, not elsewhere classified: Secondary | ICD-10-CM | POA: Diagnosis not present

## 2017-12-10 DIAGNOSIS — F0390 Unspecified dementia without behavioral disturbance: Secondary | ICD-10-CM | POA: Diagnosis not present

## 2017-12-10 DIAGNOSIS — R0902 Hypoxemia: Secondary | ICD-10-CM | POA: Diagnosis not present

## 2017-12-10 DIAGNOSIS — I5032 Chronic diastolic (congestive) heart failure: Secondary | ICD-10-CM | POA: Diagnosis not present

## 2017-12-10 DIAGNOSIS — Z9842 Cataract extraction status, left eye: Secondary | ICD-10-CM

## 2017-12-10 LAB — CBC
HEMATOCRIT: 45.1 % (ref 36.0–46.0)
Hemoglobin: 14.9 g/dL (ref 12.0–15.0)
MCH: 31.6 pg (ref 26.0–34.0)
MCHC: 33 g/dL (ref 30.0–36.0)
MCV: 95.6 fL (ref 80.0–100.0)
Platelets: 280 10*3/uL (ref 150–400)
RBC: 4.72 MIL/uL (ref 3.87–5.11)
RDW: 13.7 % (ref 11.5–15.5)
WBC: 10.5 10*3/uL (ref 4.0–10.5)
nRBC: 0 % (ref 0.0–0.2)

## 2017-12-10 LAB — PROTIME-INR
INR: 2.42
INR: 1.43
INR: 1.34
INR: 1.31
PROTHROMBIN TIME: 26 s — AB (ref 11.4–15.2)
Prothrombin Time: 17.3 seconds — ABNORMAL HIGH (ref 11.4–15.2)
Prothrombin Time: 16.4 s — ABNORMAL HIGH (ref 11.4–15.2)
Prothrombin Time: 16.1 s — ABNORMAL HIGH (ref 11.4–15.2)

## 2017-12-10 LAB — COMPREHENSIVE METABOLIC PANEL
ALBUMIN: 3.6 g/dL (ref 3.5–5.0)
ALK PHOS: 86 U/L (ref 38–126)
ALT: 17 U/L (ref 0–44)
ANION GAP: 8 (ref 5–15)
AST: 21 U/L (ref 15–41)
BUN: 14 mg/dL (ref 8–23)
CALCIUM: 9.1 mg/dL (ref 8.9–10.3)
CHLORIDE: 102 mmol/L (ref 98–111)
CO2: 28 mmol/L (ref 22–32)
Creatinine, Ser: 0.81 mg/dL (ref 0.44–1.00)
GFR calc non Af Amer: >60 mL/min (ref >60)
Glucose, Bld: 169 mg/dL — ABNORMAL HIGH (ref 70–99)
POTASSIUM: 3.4 mmol/L — AB (ref 3.5–5.1)
SODIUM: 138 mmol/L (ref 135–145)
Total Bilirubin: 0.7 mg/dL (ref 0.3–1.2)
Total Protein: 7.5 g/dL (ref 6.5–8.1)

## 2017-12-10 LAB — APTT: aPTT: 34 seconds (ref 24–36)

## 2017-12-10 LAB — DIFFERENTIAL
Abs Immature Granulocytes: 0.03 10*3/uL (ref 0.00–0.07)
BASOS ABS: 0.1 10*3/uL (ref 0.0–0.1)
BASOS PCT: 1 %
EOS PCT: 2 %
Eosinophils Absolute: 0.2 10*3/uL (ref 0.0–0.5)
IMMATURE GRANULOCYTES: 0 %
LYMPHS PCT: 28 %
Lymphs Abs: 3 10*3/uL (ref 0.7–4.0)
MONO ABS: 0.9 10*3/uL (ref 0.1–1.0)
Monocytes Relative: 8 %
NEUTROS ABS: 6.4 10*3/uL (ref 1.7–7.7)
NEUTROS PCT: 61 %

## 2017-12-10 LAB — URINALYSIS, ROUTINE W REFLEX MICROSCOPIC
BILIRUBIN URINE: NEGATIVE
Glucose, UA: NEGATIVE mg/dL
Ketones, ur: NEGATIVE mg/dL
Nitrite: NEGATIVE
PH: 7 (ref 5.0–8.0)
Protein, ur: NEGATIVE mg/dL
SPECIFIC GRAVITY, URINE: 1.01 (ref 1.005–1.030)

## 2017-12-10 LAB — RAPID URINE DRUG SCREEN, HOSP PERFORMED
AMPHETAMINES: NOT DETECTED
BARBITURATES: NOT DETECTED
BENZODIAZEPINES: NOT DETECTED
COCAINE: NOT DETECTED
Opiates: NOT DETECTED
TETRAHYDROCANNABINOL: NOT DETECTED

## 2017-12-10 LAB — GLUCOSE, CAPILLARY
GLUCOSE-CAPILLARY: 96 mg/dL (ref 70–99)
Glucose-Capillary: 154 mg/dL — ABNORMAL HIGH (ref 70–99)
Glucose-Capillary: 164 mg/dL — ABNORMAL HIGH (ref 70–99)
Glucose-Capillary: 166 mg/dL — ABNORMAL HIGH (ref 70–99)
Glucose-Capillary: 173 mg/dL — ABNORMAL HIGH (ref 70–99)

## 2017-12-10 LAB — MRSA PCR SCREENING: MRSA by PCR: NEGATIVE

## 2017-12-10 LAB — ETHANOL

## 2017-12-10 MED ORDER — PROTHROMBIN COMPLEX CONC HUMAN 500 UNITS IV KIT
1604.0000 [IU] | PACK | Freq: Once | Status: AC
  Administered 2017-12-10: 1604 [IU] via INTRAVENOUS
  Filled 2017-12-10: qty 1604

## 2017-12-10 MED ORDER — PANTOPRAZOLE SODIUM 40 MG IV SOLR
40.0000 mg | Freq: Every day | INTRAVENOUS | Status: DC
  Administered 2017-12-10 – 2017-12-11 (×2): 40 mg via INTRAVENOUS
  Filled 2017-12-10 (×2): qty 40

## 2017-12-10 MED ORDER — PROTHROMBIN COMPLEX CONC HUMAN 500 UNITS IV KIT: 50.0000 [IU]/kg | PACK | Status: DC

## 2017-12-10 MED ORDER — VITAMIN K1 10 MG/ML IJ SOLN
10.0000 mg | Freq: Once | INTRAVENOUS | Status: AC
  Administered 2017-12-10: 10 mg via INTRAVENOUS
  Filled 2017-12-10: qty 1

## 2017-12-10 MED ORDER — ACETAMINOPHEN 160 MG/5ML PO SOLN: 650.0000 mg | ORAL | Status: DC | PRN

## 2017-12-10 MED ORDER — LABETALOL HCL 5 MG/ML IV SOLN
10.0000 mg | Freq: Once | INTRAVENOUS | Status: AC
  Administered 2017-12-10: 10 mg via INTRAVENOUS
  Filled 2017-12-10: qty 4

## 2017-12-10 MED ORDER — STROKE: EARLY STAGES OF RECOVERY BOOK
Freq: Once | Status: AC
  Administered 2017-12-10: 12:00:00
  Filled 2017-12-10: qty 1

## 2017-12-10 MED ORDER — ACETAMINOPHEN 325 MG PO TABS: 650.0000 mg | ORAL_TABLET | ORAL | Status: DC | PRN

## 2017-12-10 MED ORDER — SENNOSIDES-DOCUSATE SODIUM 8.6-50 MG PO TABS
1.0000 | ORAL_TABLET | Freq: Two times a day (BID) | ORAL | Status: DC
  Administered 2017-12-11 – 2017-12-13 (×5): 1 via ORAL
  Filled 2017-12-10 (×5): qty 1

## 2017-12-10 MED ORDER — METOPROLOL TARTRATE 50 MG PO TABS
50.0000 mg | ORAL_TABLET | Freq: Two times a day (BID) | ORAL | Status: DC
  Administered 2017-12-10: 50 mg via ORAL
  Filled 2017-12-10: qty 1

## 2017-12-10 MED ORDER — GLIPIZIDE ER 10 MG PO TB24: 10.0000 mg | ORAL_TABLET | Freq: Every morning | ORAL | Status: DC

## 2017-12-10 MED ORDER — NICARDIPINE HCL IN NACL 20-0.86 MG/200ML-% IV SOLN
0.0000 mg/h | INTRAVENOUS | Status: DC
  Administered 2017-12-10 – 2017-12-11 (×6): 5 mg/h via INTRAVENOUS
  Administered 2017-12-12: 2.5 mg/h via INTRAVENOUS
  Filled 2017-12-10 (×7): qty 200

## 2017-12-10 MED ORDER — DILTIAZEM HCL ER COATED BEADS 240 MG PO CP24
240.0000 mg | ORAL_CAPSULE | Freq: Every morning | ORAL | Status: DC
  Administered 2017-12-10 – 2017-12-16 (×6): 240 mg via ORAL
  Filled 2017-12-10 (×6): qty 1

## 2017-12-10 MED ORDER — VENLAFAXINE HCL 50 MG PO TABS
100.0000 mg | ORAL_TABLET | ORAL | Status: DC
  Administered 2017-12-13: 100 mg via ORAL
  Filled 2017-12-10: qty 2

## 2017-12-10 MED ORDER — ACETAMINOPHEN 650 MG RE SUPP: 650.0000 mg | RECTAL | Status: DC | PRN

## 2017-12-10 MED ORDER — VENLAFAXINE HCL 50 MG PO TABS
50.0000 mg | ORAL_TABLET | ORAL | Status: DC
  Administered 2017-12-12: 50 mg via ORAL
  Filled 2017-12-10: qty 1

## 2017-12-10 MED ORDER — VENLAFAXINE HCL 50 MG PO TABS
50.0000 mg | ORAL_TABLET | ORAL | Status: DC
  Administered 2017-12-14: 50 mg via ORAL
  Filled 2017-12-10 (×2): qty 1

## 2017-12-10 MED ORDER — SODIUM CHLORIDE 0.9 % IV SOLN
INTRAVENOUS | Status: DC
  Administered 2017-12-10 – 2017-12-13 (×4): via INTRAVENOUS

## 2017-12-10 MED ORDER — PRAVASTATIN SODIUM 20 MG PO TABS: 20.0000 mg | ORAL_TABLET | Freq: Every day | ORAL | Status: DC

## 2017-12-10 NOTE — ED Notes (Signed)
Pharmacy was called and is making medication and will bring to ED for admin asap.

## 2017-12-10 NOTE — ED Triage Notes (Signed)
EMS reports pt family seen pt normal at 0500 this am. PT daughter found pt with left sided facial droop, left arm/leg weakness and called EMS. CBG 182. PT had a fall sometime last week and has facial bruising noted to left side of face and under right eye.

## 2017-12-10 NOTE — H&P (Addendum)
Neurology history and physical    CC: Intracranial bleed  History is obtained from: Notes and family  HPI: Traci [name] is a 83 y.o. female with a past medical history of sick sinus syndrome, hyperlipidemia, essential hypertension, CAD, chronic anticoagulation, atrial fibrillation, carotid artery disease.  Per family patient was staying over at relatives house.  She Go to the bathroom around 5:00 in the morning and was fine.  Family member brought the dog out to go to the bathroom and when came back and she noted that patient had a facial droop.  EMS was called and patient was brought to any pen.  Code stroke was called at that time.  Patient was found to have a right lentiform hemorrhage with estimated blood volume of 4 mL.  Mild surrounding edema.  She was transferred to [facility] for further evaluation and care.  At current time patient is alert and oriented able to tell me she is at [facility].  Blood pressure is well controlled and patient is able to follow commands.   ED course patient was initially brought to any pen where EKG, then CT was obtained CT showing a bleed.  Patient was on Coumadin thus she was given Kcentra along with vitamin K.  For blood pressure control patient was ordered labetalol and Cardene.  LKW: 5 AM on 12/10/2017 tpa given?: no, on anticoagulant Premorbid modified Rankin scale (mRS): 0 NIH stroke score 4 ICH Score: 1   ROS: ROS was performed and is negative except as noted in the HPI.  Past Medical History:  Diagnosis Date  . Arthritis   . Atrial fibrillation ([place])   . Carotid artery disease ([place])    Right carotid endarectomy 1999  . Chronic anticoagulation    Followed by PMD  . Coronary atherosclerosis    a. Minor at cardiac catheterization 2005 b. cath 10/16/2014 40% prox LAD dx, otherwise minimal CAD  . Depression   . Essential hypertension   . Glucose intolerance (impaired glucose tolerance)   . History of kidney stones   .  Hyperlipidemia   . Pneumonia 2010  . Sick sinus syndrome (HCC)    [employer] PPM    Family History  Problem Relation Age of Onset  . Heart attack Mother        Died age 42  . Hyperlipidemia Mother   . Diabetes Mother   . Coronary artery disease Father   . Alcohol abuse Father   . Hyperlipidemia Father   . Diabetes Sister   . Hypertension Sister   . Breast cancer Sister   . Breast cancer Sister    Social History:   reports that she has never smoked. She has never used smokeless tobacco. She reports that she does not drink alcohol or use drugs.  Medications  Current Facility-Administered Medications:  .   stroke: mapping our early stages of recovery book, , Does not apply, Once, Marliss Coots, PA-C .  acetaminophen (TYLENOL) tablet 650 mg, 650 mg, Oral, Q4H PRN **OR** acetaminophen (TYLENOL) solution 650 mg, 650 mg, Per Tube, Q4H PRN **OR** acetaminophen (TYLENOL) suppository 650 mg, 650 mg, Rectal, Q4H PRN, Marliss Coots, PA-C .  diltiazem (CARDIZEM CD) 24 hr capsule 240 mg, 240 mg, Oral, q morning - 10a, Marliss Coots, PA-C .  glipiZIDE (GLUCOTROL XL) 24 hr tablet 10 mg, 10 mg, Oral, q morning - 10a, Marliss Coots, PA-C .  metoprolol tartrate (LOPRESSOR) tablet 50 mg, 50 mg, Oral, BID, Tamala Julian,  Loralee Pacas, PA-C .  nicardipine (CARDENE) 20mg  in 0.86% saline 224ml IV infusion (0.1 mg/ml), 0-15 mg/hr, Intravenous, Continuous, Ripley Fraise, MD, Last Rate: 10 mL/hr at 12/10/17 1010, 1 mg/hr at 12/10/17 1010 .  pantoprazole (PROTONIX) injection 40 mg, 40 mg, Intravenous, QHS, Marliss Coots, PA-C .  pravastatin (PRAVACHOL) tablet 20 mg, 20 mg, Oral, QHS, Marliss Coots, PA-C .  senna-docusate (Senokot-S) tablet 1 tablet, 1 tablet, Oral, BID, Marliss Coots, PA-C .  venlafaxine [facility]) tablet 100 mg, 100 mg, Oral, Daily, Marliss Coots, PA-C   Exam: Current vital signs: BP 130/75   Pulse 71   Temp 98.1 F (36.7 C)   Resp 14   Ht 5\' 5"  (1.651 m)   Wt 74.8 kg   SpO2 97%    BMI 27.46 kg/m  Vital signs in last 24 hours: Temp:  [97.6 F (36.4 C)-98.1 F (36.7 C)] 98.1 F (36.7 C) (11/14 0945) Pulse Rate:  [52-84] 71 (11/14 1100) Resp:  [11-22] 14 (11/14 1100) BP: (123-184)/(74-109) 130/75 (11/14 1100) SpO2:  [93 %-99 %] 97 % (11/14 1100) Weight:  [74.8 kg] 74.8 kg (11/14 0703)  Physical Exam  Constitutional: Appears well-developed and well-nourished.  Psych: Affect appropriate to situation Eyes: No scleral injection HENT: No OP obstrucion Head: Normocephalic.  Patient does have significant bruising over the left aspect of her face however this is from previous fall. Cardiovascular: Normal rate and regular rhythm.  Respiratory: Effort normal, non-labored breathing GI: Soft.  No distension. There is no tenderness.  Skin: WDI  Neuro: Mental Status: Patient is awake, alert, oriented to person, place, she did have trouble with a month, year, and situation. Patient is able to give a clear and coherent history. No signs of aphasia or neglect Cranial Nerves: II: Visual Fields are full. Pupils are equal, round, and reactive to light.   III,IV, VI: EOMI without ptosis or diploplia.  V: Facial sensation is symmetric to temperature VII: Left facial droop VIII: hearing is intact to voice X: Uvula elevates symmetrically XI: Shoulder shrug is symmetric. XII: tongue is midline without atrophy or fasciculations.  Motor: Moves all extremities 4/5.  She does have a drift on her left arm and left leg Sensory: Sensation is symmetric to light touch and temperature in the arms and legs. Deep Tendon Reflexes: 1+ in the upper extremities bilaterally and no lower extremities however patient was having a very difficult time relaxing Plantars: Toes are downgoing bilaterally.  Cerebellar: FNF and HKS are intact bilaterally  Labs I have reviewed labs in epic and the results pertinent to this consultation are:   CBC    Component Value Date/Time   WBC 10.5  12/10/2017 0701   RBC 4.72 12/10/2017 0701   HGB 14.9 12/10/2017 0701   HCT 45.1 12/10/2017 0701   PLT 280 12/10/2017 0701   MCV 95.6 12/10/2017 0701   MCH 31.6 12/10/2017 0701   MCHC 33.0 12/10/2017 0701   RDW 13.7 12/10/2017 0701   LYMPHSABS 3.0 12/10/2017 0701   MONOABS 0.9 12/10/2017 0701   EOSABS 0.2 12/10/2017 0701   BASOSABS 0.1 12/10/2017 0701    CMP     Component Value Date/Time   NA 138 12/10/2017 0701   K 3.4 (L) 12/10/2017 0701   CL 102 12/10/2017 0701   CO2 28 12/10/2017 0701   GLUCOSE 169 (H) 12/10/2017 0701   BUN 14 12/10/2017 0701   CREATININE 0.81 12/10/2017 0701   CREATININE 0.93 08/31/2013 0933   CALCIUM 9.1 12/10/2017  0701   PROT 7.5 12/10/2017 0701   ALBUMIN 3.6 12/10/2017 0701   AST 21 12/10/2017 0701   ALT 17 12/10/2017 0701   ALKPHOS 86 12/10/2017 0701   BILITOT 0.7 12/10/2017 0701   GFRNONAA >60 12/10/2017 0701   GFRAA >60 12/10/2017 0701    Lipid Panel     Component Value Date/Time   CHOL 161 04/20/2013 0909   TRIG 143 04/20/2013 0909   TRIG 176 04/30/2006   HDL 36 (L) 04/20/2013 0909   CHOLHDL 4.5 04/20/2013 0909   VLDL 29 04/20/2013 0909   LDLCALC 96 04/20/2013 0909   LDLCALC 100 04/30/2006     Imaging I have reviewed the images obtained:  CT-scan of the brain- right lentiform hemorrhage with estimated blood volume of 4 mL.  Mild surrounding edema.  Etta Quill PA-C [facility] [phone]  M-F  (9:00 am- 5:00 PM)  12/10/2017, 11:22 AM    Assessment:  Patient with a right 4 cc lentiform hemorrhage.  Likely secondary to hypertension and lipohyalinosis.  Impression: Right lentiform hemorrhage  Plan: Admit to ICU  Current Suspected Etiology: Hypertension Continue Evaluation: Every hour -Admit to: 3 MICU -Discontinue any antiplatelet and/or anticoagulant -Continue Statin -Blood pressure control, goal of Systolic BP <749, started patient on cardene  -Hyperglycemia management per SSI to maintain glucose  140-180mg /dL. -PT/OT/ST therapies and recommendations when able  CNS Cerebral edema ICH -Close neuro monitoring  CV Essential (primary) hypertension -Aggressive BP control, goal SBP <140 -Titrate oral agents if needed  Heart failure, unspecified -appears to be compensated - gentle fluids  Hyperlipidemia, unspecified  - Statin for goal LDL < 70   Chronic atrial fibrillation -Rate control -Continue BB -Holding Coumadin and antiplatelets secondary to [place]  ENDO -SSI -goal HgbA1c < 7  Fluids- gentle hydration  Nutrition N.p.o. until speech therapy has evaluated- then heart healthy diet  Prophylaxis DVT: SCDs Bowel: Senokot  Diet: NPO until cleared by speech  Code Status: DNR  THE FOLLOWING WERE PRESENT ON ADMISSION: CNS -   ICH,  Cardiovascular -atrial fibrillation DNR     NEUROHOSPITALIST ADDENDUM Performed a face to face diagnostic evaluation.   I have reviewed the contents of history and physical exam as documented by PA/ARNP/Resident and agree with above documentation.  I have discussed and formulated the above plan as documented. Edits to the note have been made as needed.   83 year old [place] female on warfarin due to atrial fibrillation, chronic congestive heart failure Presented to [facility] with left arm weakness and facial droop.  Noted to have small  right basal ganglia hemorrhage on CT head.  Patient was therapeutic on warfarin and received Kcentra for reversal.  Blood pressure was elevated 449 systolic was started on Cardene drip and transferred to [facility].  Patient appears to be stable clinically with mild left sided weakness and facial droop.  Will repeat CT head later today.  Continue Cardene drip and aggressive blood pressure control.  Hold Coumadin and antiplatelets.  We will continue to follow the patient.   This patient is neurologically critically ill due to [place].  She is at risk for significant risk of  neurological worsening from expansion,  heart failure, hemorrhagic, infection, respiratory failure and seizure. This patient's care requires constant monitoring of vital signs, hemodynamics, respiratory and cardiac monitoring, review of multiple databases, neurological assessment, discussion with family, other specialists and medical decision making of high complexity.  I spent 60 minutes of neurocritical time in the care of this patient.  Karena Addison Nyesha Cliff MD [facility] [phone]   If 7pm to 7am, please call on call as listed on AMION.

## 2017-12-10 NOTE — ED Notes (Signed)
Code Stroke Cancelled at 0700 due to pt positive for ICH on CT scan.

## 2017-12-10 NOTE — ED Notes (Signed)
PT went to CT scan at 0651.

## 2017-12-10 NOTE — ED Notes (Signed)
EDP eval at bedside with EMS at this time. Code Stroke called at that time.

## 2017-12-10 NOTE — Consult Note (Deleted)
Neurology history and physical    CC: Intracranial bleed  History is obtained from: Notes and family  HPI: Traci [name] is a 83 y.o. female with a past medical history of sick sinus syndrome, hyperlipidemia, essential hypertension, CAD, chronic anticoagulation, atrial fibrillation, carotid artery disease.  Per family patient was staying over at relatives house.  She Go to the bathroom around 5:00 in the morning and was fine.  Family member brought the dog out to go to the bathroom and when came back and she noted that patient had a facial droop.  EMS was called and patient was brought to any pen.  Code stroke was called at that time.  Patient was found to have a right lentiform hemorrhage with estimated blood volume of 4 mL.  Mild surrounding edema.  She was transferred to [facility] for further evaluation and care.  At current time patient is alert and oriented able to tell me she is at [facility].  Blood pressure is well controlled and patient is able to follow commands.   ED course patient was initially brought to any pen where EKG, then CT was obtained CT showing a bleed.  Patient was on Coumadin thus she was given Kcentra along with vitamin K.  For blood pressure control patient was ordered labetalol and Cardene.  LKW: 5 AM on 12/10/2017 tpa given?: no, on anticoagulant Premorbid modified Rankin scale (mRS): 0 NIH stroke score 4 ICH Score: 1   ROS: ROS was performed and is negative except as noted in the HPI.  Past Medical History:  Diagnosis Date  . Arthritis   . Atrial fibrillation ([place])   . Carotid artery disease ([place])    Right carotid endarectomy 1999  . Chronic anticoagulation    Followed by PMD  . Coronary atherosclerosis    a. Minor at cardiac catheterization 2005 b. cath 10/16/2014 40% prox LAD dx, otherwise minimal CAD  . Depression   . Essential hypertension   . Glucose intolerance (impaired glucose tolerance)   . History of kidney stones   .  Hyperlipidemia   . Pneumonia 2010  . Sick sinus syndrome (HCC)    [employer]tr[employer] PPM    Family History  Problem Relation Age of Onset  . Heart attack Mother        Died age 53  . Hyperlipidemia Mother   . Diabetes Mother   . Coronary artery disease Father   . Alcohol abuse Father   . Hyperlipidemia Father   . Diabetes Sister   . Hypertension Sister   . Breast cancer Sister   . Breast cancer Sister    Social History:   reports that she has never smoked. She has never used smokeless tobacco. She reports that she does not drink alcohol or use drugs.  Medications  Current Facility-Administered Medications:  .   stroke: mapping our early stages of recovery book, , Does not apply, Once, Marliss Coots, PA-C .  acetaminophen (TYLENOL) tablet 650 mg, 650 mg, Oral, Q4H PRN **OR** acetaminophen (TYLENOL) solution 650 mg, 650 mg, Per Tube, Q4H PRN **OR** acetaminophen (TYLENOL) suppository 650 mg, 650 mg, Rectal, Q4H PRN, Marliss Coots, PA-C .  diltiazem (CARDIZEM CD) 24 hr capsule 240 mg, 240 mg, Oral, q morning - 10a, Marliss Coots, PA-C .  glipiZIDE (GLUCOTROL XL) 24 hr tablet 10 mg, 10 mg, Oral, q morning - 10a, Marliss Coots, PA-C .  metoprolol tartrate (LOPRESSOR) tablet 50 mg, 50 mg, Oral, BID, Tamala Julian,  Loralee Pacas, PA-C .  nicardipine (CARDENE) 20mg  in 0.86% saline 275ml IV infusion (0.1 mg/ml), 0-15 mg/hr, Intravenous, Continuous, Ripley Fraise, MD, Last Rate: 10 mL/hr at 12/10/17 1010, 1 mg/hr at 12/10/17 1010 .  pantoprazole (PROTONIX) injection 40 mg, 40 mg, Intravenous, QHS, Marliss Coots, PA-C .  pravastatin (PRAVACHOL) tablet 20 mg, 20 mg, Oral, QHS, Marliss Coots, PA-C .  senna-docusate (Senokot-S) tablet 1 tablet, 1 tablet, Oral, BID, Marliss Coots, PA-C .  venlafaxine [facility] tablet 100 mg, 100 mg, Oral, Daily, Marliss Coots, PA-C   Exam: Current vital signs: BP 130/75   Pulse 71   Temp 98.1 F (36.7 C)   Resp 14   Ht 5\' 5"  (1.651 m)   Wt 74.8 kg   SpO2 97%    BMI 27.46 kg/m  Vital signs in last 24 hours: Temp:  [97.6 F (36.4 C)-98.1 F (36.7 C)] 98.1 F (36.7 C) (11/14 0945) Pulse Rate:  [52-84] 71 (11/14 1100) Resp:  [11-22] 14 (11/14 1100) BP: (123-184)/(74-109) 130/75 (11/14 1100) SpO2:  [93 %-99 %] 97 % (11/14 1100) Weight:  [74.8 kg] 74.8 kg (11/14 0703)  Physical Exam  Constitutional: Appears well-developed and well-nourished.  Psych: Affect appropriate to situation Eyes: No scleral injection HENT: No OP obstrucion Head: Normocephalic.  Patient does have significant bruising over the left aspect of her face however this is from previous fall. Cardiovascular: Normal rate and regular rhythm.  Respiratory: Effort normal, non-labored breathing GI: Soft.  No distension. There is no tenderness.  Skin: WDI  Neuro: Mental Status: Patient is awake, alert, oriented to person, place, she did have trouble with a month, year, and situation. Patient is able to give a clear and coherent history. No signs of aphasia or neglect Cranial Nerves: II: Visual Fields are full. Pupils are equal, round, and reactive to light.   III,IV, VI: EOMI without ptosis or diploplia.  V: Facial sensation is symmetric to temperature VII: Left facial droop VIII: hearing is intact to voice X: Uvula elevates symmetrically XI: Shoulder shrug is symmetric. XII: tongue is midline without atrophy or fasciculations.  Motor: Moves all extremities 4/5.  She does have a drift on her left arm and left leg Sensory: Sensation is symmetric to light touch and temperature in the arms and legs. Deep Tendon Reflexes: 1+ in the upper extremities bilaterally and no lower extremities however patient was having a very difficult time relaxing Plantars: Toes are downgoing bilaterally.  Cerebellar: FNF and HKS are intact bilaterally  Labs I have reviewed labs in epic and the results pertinent to this consultation are:   CBC    Component Value Date/Time   WBC 10.5  12/10/2017 0701   RBC 4.72 12/10/2017 0701   HGB 14.9 12/10/2017 0701   HCT 45.1 12/10/2017 0701   PLT 280 12/10/2017 0701   MCV 95.6 12/10/2017 0701   MCH 31.6 12/10/2017 0701   MCHC 33.0 12/10/2017 0701   RDW 13.7 12/10/2017 0701   LYMPHSABS 3.0 12/10/2017 0701   MONOABS 0.9 12/10/2017 0701   EOSABS 0.2 12/10/2017 0701   BASOSABS 0.1 12/10/2017 0701    CMP     Component Value Date/Time   NA 138 12/10/2017 0701   K 3.4 (L) 12/10/2017 0701   CL 102 12/10/2017 0701   CO2 28 12/10/2017 0701   GLUCOSE 169 (H) 12/10/2017 0701   BUN 14 12/10/2017 0701   CREATININE 0.81 12/10/2017 0701   CREATININE 0.93 08/31/2013 0933   CALCIUM 9.1 12/10/2017  0701   PROT 7.5 12/10/2017 0701   ALBUMIN 3.6 12/10/2017 0701   AST 21 12/10/2017 0701   ALT 17 12/10/2017 0701   ALKPHOS 86 12/10/2017 0701   BILITOT 0.7 12/10/2017 0701   GFRNONAA >60 12/10/2017 0701   GFRAA >60 12/10/2017 0701    Lipid Panel     Component Value Date/Time   CHOL 161 04/20/2013 0909   TRIG 143 04/20/2013 0909   TRIG 176 04/30/2006   HDL 36 (L) 04/20/2013 0909   CHOLHDL 4.5 04/20/2013 0909   VLDL 29 04/20/2013 0909   LDLCALC 96 04/20/2013 0909   LDLCALC 100 04/30/2006     Imaging I have reviewed the images obtained:  CT-scan of the brain- right lentiform hemorrhage with estimated blood volume of 4 mL.  Mild surrounding edema.  Etta Quill PA-C [facility] [phone]  M-F  (9:00 am- 5:00 PM)  12/10/2017, 11:22 AM    Assessment:  Patient with a right 4 cc lentiform hemorrhage.  Likely secondary to hypertension and lipohyalinosis.  Impression: Right lentiform hemorrhage  Plan: Admit to ICU Right ICH  Acuity: Acute Current Suspected Etiology: Hypertension Continue Evaluation: Every hour -Admit to: 3 MICU -Discontinue any antiplatelet and/or anticoagulant -Continue Statin -Blood pressure control, goal of SYS <140 -Hyperglycemia management per SSI to maintain glucose  140-180mg /dL. -PT/OT/ST therapies and recommendations when able  CNS Cerebral edema ICH -Close neuro monitoring  CV Essential (primary) hypertension -Aggressive BP control, goal SBP <140 -Titrate oral agents if needed  Heart failure, unspecified -Cards Consult if needed but not needed at this time  Hyperlipidemia, unspecified  - Statin for goal LDL < 70   Chronic atrial fibrillation -Rate control -Continue BB -Holding Coumadin and antiplatelets secondary to [place]  ENDO -SSI -Start oral meds -goal HgbA1c < 7  Fluids- gentle hydration  Nutrition N.p.o. until speech therapy has evaluated- then heart healthy diet  Prophylaxis DVT: SCDs Bowel: Senokot  Diet: NPO until cleared by speech  Code Status: DNR  THE FOLLOWING WERE PRESENT ON ADMISSION: CNS -   ICH,  Cardiovascular -atrial fibrillation Infectious - Sepsis, Meningitis, UTI, Arrived with foley catheter from outside facility, Pressure Ulcer DNR

## 2017-12-10 NOTE — ED Notes (Signed)
[place] paged @ [phone]

## 2017-12-10 NOTE — ED Notes (Signed)
[facility] arrived at this time. 

## 2017-12-10 NOTE — ED Provider Notes (Signed)
[facility] EMERGENCY DEPARTMENT Provider Note   CSN: [id number] Arrival date & time: 12/10/17  9485   An emergency department physician performed an initial assessment on this suspected stroke patient at [phone].  History   Chief Complaint Chief Complaint  Patient presents with  . Code Stroke   Level 5 caveat due to acuity of condition HPI Traci [name] is a 83 y.o. female.  The history is provided by the patient, the EMS personnel and a relative. The history is limited by the condition of the patient.  Weakness  Primary symptoms include focal weakness, speech change. This is a new problem. The current episode started 1 to 2 hours ago. The problem has been rapidly worsening. There was left facial and left upper extremity focality noted. There has been no fever. Associated symptoms include confusion.  Presents from home with concern for stroke.  Last known well 5 AM.  Family noticed after waking up, she began developing a new left-sided facial droop & left arm weakness.  There is also slurred speech.  Family reports she has had multiple recent falls, does have bruising on her face for several days.  Past Medical History:  Diagnosis Date  . Arthritis   . Atrial fibrillation ([place])   . Carotid artery disease ([place])    Right carotid endarectomy 1999  . Chronic anticoagulation    Followed by PMD  . Coronary atherosclerosis    a. Minor at cardiac catheterization 2005 b. cath 10/16/2014 40% prox LAD dx, otherwise minimal CAD  . Depression   . Essential hypertension   . Glucose intolerance (impaired glucose tolerance)   . History of kidney stones   . Hyperlipidemia   . Pneumonia 2010  . Sick sinus syndrome [facility])    [employer] PPM    Patient Active Problem List   Diagnosis Date Noted  . ICH (intracerebral hemorrhage) ([place]) 12/10/2017  . Dementia ([place]) 05/05/2016  . Right wrist fracture, with routine healing, subsequent encounter 05/01/2016  . Wrist fracture, right 04/29/2016  .  Frequent falls 04/29/2016  . DM hyperosmolarity type II ([place]) 04/29/2016  . Diabetes mellitus with complication ([place])   . Physical deconditioning   . Acute blood loss anemia 02/06/2016  . Closed fracture of coccyx ([place]) 02/06/2016  . Female pelvic hematoma 02/02/2016  . Chest pain at rest 10/17/2014  . Chronic diastolic heart failure ([place]) 10/14/2014  . Peripheral neuropathy 08/31/2013  . Leg swelling 08/31/2013  . CAD (coronary artery disease) 04/06/2013  . OA (osteoarthritis) 04/06/2013  . Other malaise and fatigue 04/06/2013  . Essential hypertension, benign 04/06/2013  . Atrial fibrillation ([place]) 06/11/2010  . HYPERLIPIDEMIA 02/26/2010  . Cardiac pacemaker in situ 06/13/2009    Past Surgical History:  Procedure Laterality Date  . ABDOMINAL HYSTERECTOMY    . APPENDECTOMY    . BREAST BIOPSY Bilateral    x 7 total  . CARDIAC CATHETERIZATION N/A 10/16/2014   Procedure: Left Heart Cath and Coronary Angiography;  Surgeon: Leonie Man, MD;  Location: [place] CV LAB;  Service: Cardiovascular;  Laterality: N/A;  . CAROTID ENDARTERECTOMY Right 1999  . CATARACT EXTRACTION W/ INTRAOCULAR LENS  IMPLANT, BILATERAL Bilateral   . COLONOSCOPY     2006  . CYSTOSCOPY W/ URETERAL STENT PLACEMENT Left 04/19/2012   Procedure: CYSTOSCOPY WITH RETROGRADE PYELOGRAM/URETERAL STENT PLACEMENT ;  Surgeon: Ailene Rud, MD;  Location: [facility];  Service: Urology;  Laterality: Left;  . CYSTOSCOPY WITH RETROGRADE PYELOGRAM, URETEROSCOPY AND STENT PLACEMENT Left 06/30/2012  Procedure: CYSTOSCOPY WITH LEFT  RETROGRADE PYELOGRAM, URETEROSCOPY  with basketing of stone, AND STENT PLACEMENT, TRANSURETHRAL UNROOFING OF URETER.;  Surgeon: Alexis Frock, MD;  Location: [facility];  Service: Urology;  Laterality: Left;  . INSERT / REPLACE / REMOVE PACEMAKER  2006  . OPEN REDUCTION INTERNAL FIXATION (ORIF) DISTAL RADIAL FRACTURE Right 04/29/2016   Procedure: OPEN REDUCTION INTERNAL FIXATION (ORIF) DISTAL RADIAL  FRACTURE;  Surgeon: Roseanne Kaufman, MD;  Location: [place];  Service: Orthopedics;  Laterality: Right;  . ORIF DISTAL RADIUS FRACTURE Right 04/29/2016     OB History    Gravida      Para      Term      Preterm      AB      Living  2     SAB      TAB      Ectopic      Multiple      Live Births               Home Medications    Prior to Admission medications   Medication Sig Start Date End Date Taking? Authorizing Provider  diltiazem (CARDIZEM CD) 240 MG 24 hr capsule Take 240 mg by mouth every morning.     [provider]  glipiZIDE (GLUCOTROL XL) 10 MG 24 hr tablet Take 1 tablet by mouth every morning.  08/22/17   [provider]  losartan (COZAAR) 50 MG tablet Take 1 tablet (50 mg total) by mouth 2 (two) times daily. In the morning 08/21/16   Evans Lance, MD  metoprolol tartrate (LOPRESSOR) 50 MG tablet Take 50 mg by mouth 2 (two) times daily. 11/19/17   [provider]  pravastatin (PRAVACHOL) 20 MG tablet Take 1 tablet by mouth at bedtime. 07/31/17   [provider]  sulfamethoxazole-trimethoprim (BACTRIM DS,SEPTRA DS) 800-160 MG tablet Take 1 tablet by mouth 2 (two) times daily. 7 day course starting on 11/23/2017 11/23/17   [provider]  venlafaxine (EFFEXOR) 100 MG tablet Take 50-100 mg by mouth daily. Alternate taking 50mg  daily for 2 days, then take 100mg  for 1 day, then repeat (50mg , 50mg , 100mg , 50mg , etc) 09/18/17   [provider]  warfarin (COUMADIN) 5 MG tablet Take 2.5-5 mg by mouth daily. Take 2.5 mg on Mon / Tue / Thurs / Sat / Sun Take 5 mg on Wed / Fri    [provider]    Family History Family History  Problem Relation Age of Onset  . Heart attack Mother        Died age 84  . Hyperlipidemia Mother   . Diabetes Mother   . Coronary artery disease Father   . Alcohol abuse Father   . Hyperlipidemia Father   . Diabetes Sister   . Hypertension Sister   . Breast cancer Sister     . Breast cancer Sister     Social History Social History   Tobacco Use  . Smoking status: Never Smoker  . Smokeless tobacco: Never Used  Substance Use Topics  . Alcohol use: No    Alcohol/week: 0.0 standard drinks  . Drug use: No     Allergies   Morphine and Penicillins   Review of Systems Review of Systems  Unable to perform ROS: Acuity of condition  Neurological: Positive for speech change, focal weakness and weakness.  Psychiatric/Behavioral: Positive for confusion.     Physical Exam Updated Vital Signs BP (!) 146/85   Pulse 71  Temp 97.6 F (36.4 C) (Oral)   Resp 11   Ht 1.651 m (5\' 5" )   Wt 74.8 kg   SpO2 98%   BMI 27.46 kg/m   Physical Exam CONSTITUTIONAL: Elderly, anxious HEAD: Normocephalic/atraumatic EYES: EOMI/PERRL ENMT: Mucous membranes moist, bruising noted to the left side of face NECK: supple no meningeal signs SPINE/BACK:entire spine nontender CV: S1/S2 noted, no loud murmurs LUNGS: Lungs are clear to auscultation bilaterally, no apparent distress ABDOMEN: soft, nontender, no rebound or guarding, bowel sounds noted throughout abdomen GU:no cva tenderness NEURO: Pt is awake/alert. left Facial droop noted.  Dysarthria noted.  No arm drift.  Mild left lower extremity drift noted EXTREMITIES: pulses normal/equal, full ROM SKIN: warm, color normal PSYCH: Unable to assess  ED Treatments / Results  Labs (all labs ordered are listed, but only abnormal results are displayed) Labs Reviewed  PROTIME-INR - Abnormal; Notable for the following components:      Result Value   Prothrombin Time 26.0 (*)    All other components within normal limits  COMPREHENSIVE METABOLIC PANEL - Abnormal; Notable for the following components:   Potassium 3.4 (*)    Glucose, Bld 169 (*)    All other components within normal limits  URINALYSIS, ROUTINE W REFLEX MICROSCOPIC - Abnormal; Notable for the following components:   APPearance HAZY (*)    Hgb urine  dipstick SMALL (*)    Leukocytes, UA TRACE (*)    Bacteria, UA RARE (*)    All other components within normal limits  GLUCOSE, CAPILLARY - Abnormal; Notable for the following components:   Glucose-Capillary 164 (*)    All other components within normal limits  ETHANOL  APTT  CBC  DIFFERENTIAL  RAPID URINE DRUG SCREEN, HOSP PERFORMED    EKG EKG Interpretation  Date/Time:  Thursday December 10 2017 06:58:33 EST Ventricular Rate:  85 PR Interval:    QRS Duration: 158 QT Interval:  479 QTC Calculation: 570 R Axis:   -76 Text Interpretation:  Afib/flutter and ventricular-paced rhythm No further analysis attempted due to paced rhythm Abnormal ekg Confirmed by Ripley Fraise [id number]) on 12/10/2017 7:18:35 AM   Radiology Ct Head Code Stroke Wo Contrast  Result Date: 12/10/2017 CLINICAL DATA:  Code stroke. 83 year old female with sudden onset left side weakness. EXAM: CT HEAD WITHOUT CONTRAST TECHNIQUE: Contiguous axial images were obtained from the base of the skull through the vertex without intravenous contrast. COMPARISON:  Head CT without contrast 11/24/2017 and earlier. FINDINGS: Brain: There is an oval or diamond-shaped hyperdense hemorrhage in the right lentiform and Corona radiata encompassing 19 x 17 x 25 millimeters (AP by transverse by CC) for an estimated blood volume of 4 milliliters. Mild surrounding edema. Minor regional mass effect. No extension into the ventricles or outside of the brain parenchyma. Superimposed confluent bilateral cerebral white matter hypodensity and deep gray matter heterogeneity. Cavum septum pellucidum, normal variant. No acute cortically based infarct identified. 12-13 millimeter calcified right tentorial meningioma re-demonstrated, and was 10 millimeters in 2012. Vascular: Calcified atherosclerosis at the skull base. No suspicious intracranial vascular hyperdensity. The Skull: Stable and intact. Sinuses/Orbits: Continued left OMC obstructive pattern  sinus disease. Stable sinus and mastoid aeration. Other: Partially resolved left scalp hematoma since October. No new scalp or orbits soft tissue abnormality. ASPECTS [facility] Stroke Program Early CT Score) Total score (0-10 with 10 being normal): Not applicable, acute hemorrhage. IMPRESSION: 1. Small acute right lentiform hemorrhage with estimated blood volume of 4 mL. Mild surrounding edema. No ventricular or extra-axial  extension. 2. Critical Value/emergent results were called by telephone at the time of interpretation on 12/10/2017 at 7:03 am to Dr. Ripley Fraise , who verbally acknowledged these results. 3. Underlying chronic small vessel disease. Partially resolved left scalp hematoma since October. Small chronic right tentorial meningioma. Electronically Signed   By: Genevie Ann M.D.   On: 12/10/2017 07:04    Procedures Procedures  CRITICAL CARE Performed by: Sharyon Cable Total critical care time: 31 minutes Critical care time was exclusive of separately billable procedures and treating other patients. Critical care was necessary to treat or prevent imminent or life-threatening deterioration. Critical care was time spent personally by me on the following activities: development of treatment plan with patient and/or surrogate as well as nursing, discussions with consultants, evaluation of patient's response to treatment, examination of patient, obtaining history from patient or surrogate, ordering and performing treatments and interventions, ordering and review of laboratory studies, ordering and review of radiographic studies, pulse oximetry and re-evaluation of patient's condition.   Medications Ordered in ED Medications  nicardipine (CARDENE) 20mg  in 0.86% saline 214ml IV infusion (0.1 mg/ml) (10 mg/hr Intravenous Rate/Dose Change 12/10/17 0800)  prothrombin complex conc human (KCENTRA) IVPB 1,604 Units (1,604 Units Intravenous New Bag/Given 12/10/17 0817)  phytonadione (VITAMIN K) 10 mg  in dextrose 5 % 50 mL IVPB (10 mg Intravenous New Bag/Given 12/10/17 0811)  labetalol (NORMODYNE,TRANDATE) injection 10 mg (10 mg Intravenous Given 12/10/17 0719)     Initial Impression / Assessment and Plan / ED Course  I have reviewed the triage vital signs and the nursing notes.  Pertinent labs & imaging results that were available during my care of the patient were reviewed by me and considered in my medical decision making (see chart for details).     8:14 AM Patient seen on arrival for concern for stroke.  She was sent immediately for CT head, this revealed a small ICH.  Likely hypertension related.  Discussed the case with radiology and we reviewed CT results.  I then called the neurologist on-call.  Discussed with Dr. Lorraine Lax with neurology He recommends need to rapidly lower blood pressure with labetalol and Cardene.  Goal of SBP 140. Patient also on Coumadin, it is recommended she be given Kcentra.  After discussion with pharmacy, patient will be given IV but vitamin K as well as K Centra to reverse coumadin   Discussed the case with granddaughter and explained plan to transfer to [facility]  Patient currently awake and alert at this time. 8:18 AM Family updated Plan to transfer to [facility] Final Clinical Impressions(s) / ED Diagnoses   Final diagnoses:  Other right-sided nontraumatic intracerebral hemorrhage [facility])  Hypertensive emergency    ED Discharge Orders    None       Ripley Fraise, MD 12/10/17 [phone]

## 2017-12-11 ENCOUNTER — Inpatient Hospital Stay (HOSPITAL_COMMUNITY): Payer: PPO

## 2017-12-11 LAB — BASIC METABOLIC PANEL
ANION GAP: 7 (ref 5–15)
BUN: 16 mg/dL (ref 8–23)
CO2: 27 mmol/L (ref 22–32)
Calcium: 8.7 mg/dL — ABNORMAL LOW (ref 8.9–10.3)
Chloride: 105 mmol/L (ref 98–111)
Creatinine, Ser: 0.94 mg/dL (ref 0.44–1.00)
GFR calc Af Amer: >60 mL/min (ref >60)
GFR calc non Af Amer: 55 mL/min — ABNORMAL LOW (ref >60)
GLUCOSE: 170 mg/dL — AB (ref 70–99)
POTASSIUM: 3.6 mmol/L (ref 3.5–5.1)
Sodium: 139 mmol/L (ref 135–145)

## 2017-12-11 LAB — GLUCOSE, CAPILLARY
GLUCOSE-CAPILLARY: 178 mg/dL — AB (ref 70–99)
GLUCOSE-CAPILLARY: 166 mg/dL — AB (ref 70–99)
GLUCOSE-CAPILLARY: 151 mg/dL — AB (ref 70–99)
Glucose-Capillary: 177 mg/dL — ABNORMAL HIGH (ref 70–99)
Glucose-Capillary: 179 mg/dL — ABNORMAL HIGH (ref 70–99)

## 2017-12-11 LAB — MAGNESIUM: Magnesium: 1.8 mg/dL (ref 1.7–2.4)

## 2017-12-11 LAB — PROTIME-INR
INR: 1.27
Prothrombin Time: 15.8 seconds — ABNORMAL HIGH (ref 11.4–15.2)

## 2017-12-11 MED ORDER — IOPAMIDOL (ISOVUE-370) INJECTION 76%
75.0000 mL | Freq: Once | INTRAVENOUS | Status: AC | PRN
  Administered 2017-12-11: 75 mL via INTRAVENOUS

## 2017-12-11 MED ORDER — LORAZEPAM 2 MG/ML IJ SOLN
0.5000 mg | Freq: Once | INTRAMUSCULAR | Status: AC
  Administered 2017-12-11: 0.5 mg via INTRAVENOUS
  Filled 2017-12-11: qty 1

## 2017-12-11 MED ORDER — LABETALOL HCL 5 MG/ML IV SOLN
10.0000 mg | INTRAVENOUS | Status: DC | PRN
  Administered 2017-12-12 – 2017-12-13 (×8): 20 mg via INTRAVENOUS
  Administered 2017-12-13: 10 mg via INTRAVENOUS
  Administered 2017-12-15 (×3): 20 mg via INTRAVENOUS
  Administered 2017-12-16 (×2): 10 mg via INTRAVENOUS
  Administered 2017-12-16: 20 mg via INTRAVENOUS
  Filled 2017-12-11 (×15): qty 4

## 2017-12-11 MED ORDER — METOPROLOL TARTRATE 50 MG PO TABS
50.0000 mg | ORAL_TABLET | Freq: Two times a day (BID) | ORAL | Status: DC
  Administered 2017-12-11 – 2017-12-16 (×11): 50 mg via ORAL
  Filled 2017-12-11 (×11): qty 1

## 2017-12-11 MED ORDER — LOSARTAN POTASSIUM 50 MG PO TABS
50.0000 mg | ORAL_TABLET | Freq: Every day | ORAL | Status: DC
  Administered 2017-12-11 – 2017-12-12 (×2): 50 mg via ORAL
  Filled 2017-12-11 (×2): qty 1

## 2017-12-11 MED ORDER — MELATONIN 3 MG PO TABS
3.0000 mg | ORAL_TABLET | Freq: Every evening | ORAL | Status: DC | PRN
  Administered 2017-12-11 – 2017-12-15 (×3): 3 mg via ORAL
  Filled 2017-12-11 (×6): qty 1

## 2017-12-11 MED ORDER — IOPAMIDOL (ISOVUE-370) INJECTION 76%
INTRAVENOUS | Status: AC
  Filled 2017-12-11: qty 100

## 2017-12-11 MED ORDER — TRAZODONE HCL 50 MG PO TABS: 50.0000 mg | ORAL_TABLET | Freq: Once | ORAL | Status: DC

## 2017-12-11 MED ORDER — ORAL CARE MOUTH RINSE
15.0000 mL | Freq: Two times a day (BID) | OROMUCOSAL | Status: DC
  Administered 2017-12-11 – 2017-12-13 (×4): 15 mL via OROMUCOSAL

## 2017-12-11 MED ORDER — CHLORHEXIDINE GLUCONATE 0.12 % MT SOLN
15.0000 mL | Freq: Two times a day (BID) | OROMUCOSAL | Status: DC
  Administered 2017-12-11 – 2017-12-16 (×9): 15 mL via OROMUCOSAL
  Filled 2017-12-11 (×7): qty 15

## 2017-12-11 NOTE — Progress Notes (Signed)
PT Cancellation Note  Patient Details Name: Traci [name] MRN: [id number] DOB: February 18, 1934   Cancelled Treatment:    Reason Eval/Treat Not Completed: Active bedrest order, clarification pending per OT.  Reinaldo Berber, PT, DPT Acute Rehabilitation Services Pager: [phone] Office: [phone]     Reinaldo Berber 12/11/2017, 11:33 AM

## 2017-12-11 NOTE — Evaluation (Signed)
Speech Language Pathology Evaluation Patient Details Name: Traci [name] MRN: [id number] DOB: 1934-09-06 Today's Date: 12/11/2017 Time: 4008-6761 SLP Time Calculation (min) (ACUTE ONLY): 28 min  Problem List:  Patient Active Problem List   Diagnosis Date Noted  . ICH (intracerebral hemorrhage) ([place]) 12/10/2017  . Intracranial bleed ([place]) 12/10/2017  . Dementia ([place]) 05/05/2016  . Right wrist fracture, with routine healing, subsequent encounter 05/01/2016  . Wrist fracture, right 04/29/2016  . Frequent falls 04/29/2016  . DM hyperosmolarity type II ([place]) 04/29/2016  . Diabetes mellitus with complication ([place])   . Physical deconditioning   . Acute blood loss anemia 02/06/2016  . Closed fracture of coccyx ([place]) 02/06/2016  . Female pelvic hematoma 02/02/2016  . Chest pain at rest 10/17/2014  . Chronic diastolic heart failure ([place]) 10/14/2014  . Peripheral neuropathy 08/31/2013  . Leg swelling 08/31/2013  . CAD (coronary artery disease) 04/06/2013  . OA (osteoarthritis) 04/06/2013  . Other malaise and fatigue 04/06/2013  . Essential hypertension, benign 04/06/2013  . Atrial fibrillation ([place]) 06/11/2010  . HYPERLIPIDEMIA 02/26/2010  . Cardiac pacemaker in situ 06/13/2009   Past Medical History:  Past Medical History:  Diagnosis Date  . Arthritis   . Atrial fibrillation ([place])   . Carotid artery disease ([place])    Right carotid endarectomy 1999  . Chronic anticoagulation    Followed by PMD  . Coronary atherosclerosis    a. Minor at cardiac catheterization 2005 b. cath 10/16/2014 40% prox LAD dx, otherwise minimal CAD  . Depression   . Essential hypertension   . Glucose intolerance (impaired glucose tolerance)   . History of kidney stones   . Hyperlipidemia   . Pneumonia 2010  . Sick sinus syndrome ([facility])    [employer] PPM   Past Surgical History:  Past Surgical History:  Procedure Laterality Date  . ABDOMINAL HYSTERECTOMY    . APPENDECTOMY    . BREAST BIOPSY Bilateral     x 7 total  . CARDIAC CATHETERIZATION N/A 10/16/2014   Procedure: Left Heart Cath and Coronary Angiography;  Surgeon: Leonie Man, MD;  Location: [place] CV LAB;  Service: Cardiovascular;  Laterality: N/A;  . CAROTID ENDARTERECTOMY Right 1999  . CATARACT EXTRACTION W/ INTRAOCULAR LENS  IMPLANT, BILATERAL Bilateral   . COLONOSCOPY     2006  . CYSTOSCOPY W/ URETERAL STENT PLACEMENT Left 04/19/2012   Procedure: CYSTOSCOPY WITH RETROGRADE PYELOGRAM/URETERAL STENT PLACEMENT ;  Surgeon: Ailene Rud, MD;  [facility];  Service: Urology;  Laterality: Left;  . CYSTOSCOPY WITH RETROGRADE PYELOGRAM, URETEROSCOPY AND STENT PLACEMENT Left 06/30/2012   Procedure: CYSTOSCOPY WITH LEFT  RETROGRADE PYELOGRAM, URETEROSCOPY  with basketing of stone, AND STENT PLACEMENT, TRANSURETHRAL UNROOFING OF URETER.;  Surgeon: Alexis Frock, MD;  [facility];  Service: Urology;  Laterality: Left;  . INSERT / REPLACE / REMOVE PACEMAKER  2006  . OPEN REDUCTION INTERNAL FIXATION (ORIF) DISTAL RADIAL FRACTURE Right 04/29/2016   Procedure: OPEN REDUCTION INTERNAL FIXATION (ORIF) DISTAL RADIAL FRACTURE;  Surgeon: Roseanne Kaufman, MD;  Location: [place];  Service: Orthopedics;  Laterality: Right;  . ORIF DISTAL RADIUS FRACTURE Right 04/29/2016   HPI:  Pt is an 83 y.o. female admitted with L facial droop. CT showed a R lentiform hemorrhage with mild surrounding edema. PMH includes: dementia (living in ILF), sick sinus syndrome, hyperlipidemia, essential hypertension, CAD, chronic anticoagulation, atrial fibrillation, carotid artery disease, PNA   Assessment / Plan / Recommendation Clinical Impression  Family reports a h/o dementia and forgetfulness, but she  was living at [place] with someone intermittently checking in on her. Today she has limited working memory and short-term recall even within a single conversation topic. She has impaired sustained attention, problem solving, and intellectual awareness. She cannot  tell me any of her acute impairments and does not know why she is in the hospital. Pt was also forgetful of family members' names and their relationship to her, which is something her daughter says she does very well. Her daughter believes that this is an acute decline in cognitive function. Mild dysarthria is also present from L facial droop, although she is easily understood at the conversational level. SLP will f/u to maximize safety, with 24/7 supervision and f/u SLP recommended post-acute care as well.    SLP Assessment  SLP Recommendation/Assessment: Patient needs continued Speech Lanaguage Pathology Services SLP Visit Diagnosis: Cognitive communication deficit (R41.841)    Follow Up Recommendations  (tba)    Frequency and Duration min 2x/week  2 weeks      SLP Evaluation Cognition  Overall Cognitive Status: Impaired/Different from baseline Arousal/Alertness: Awake/alert Orientation Level: Oriented to person;Disoriented to time;Disoriented to situation;Oriented to place Attention: Sustained Sustained Attention: Impaired Sustained Attention Impairment: Verbal basic Memory: Impaired Memory Impairment: Decreased recall of new information;Decreased short term memory Decreased Short Term Memory: Verbal basic;Functional basic Awareness: Impaired Awareness Impairment: Intellectual impairment Problem Solving: Impaired Problem Solving Impairment: Verbal basic;Functional basic Safety/Judgment: Impaired       Comprehension  Auditory Comprehension Overall Auditory Comprehension: Appears within functional limits for tasks assessed(simple commands, conversation)    Expression Expression Primary Mode of Expression: Verbal Verbal Expression Overall Verbal Expression: Appears within functional limits for tasks assessed   Oral / Motor  Oral Motor/Sensory Function Overall Oral Motor/Sensory Function: Moderate impairment Facial ROM: Reduced left;Suspected CN VII (facial)  dysfunction Facial Symmetry: Abnormal symmetry left;Suspected CN VII (facial) dysfunction Facial Strength: Reduced left;Suspected CN VII (facial) dysfunction Facial Sensation: Reduced left;Suspected CN V (Trigeminal) dysfunction Lingual ROM: Reduced left;Suspected CN XII (hypoglossal) dysfunction Lingual Symmetry: Abnormal symmetry left;Suspected CN XII (hypoglossal) dysfunction Motor Speech Overall Motor Speech: Impaired Respiration: Within functional limits Phonation: Normal Resonance: Within functional limits Articulation: Impaired Level of Impairment: Conversation Intelligibility: Intelligible   GO                    Germain Osgood 12/11/2017, 5:13 PM   Germain Osgood, M.A. [place] Acute Environmental education officer [phone] Office [phone]

## 2017-12-11 NOTE — Evaluation (Signed)
Clinical/Bedside Swallow Evaluation Patient Details  Name: Traci [name] MRN: [id number] Date of Birth: 01/11/1935  Today's Date: 12/11/2017 Time: SLP Start Time (ACUTE ONLY): 1351 SLP Stop Time (ACUTE ONLY): 1410 SLP Time Calculation (min) (ACUTE ONLY): 19 min  Past Medical History:  Past Medical History:  Diagnosis Date  . Arthritis   . Atrial fibrillation ([place])   . Carotid artery disease ([place])    Right carotid endarectomy 1999  . Chronic anticoagulation    Followed by PMD  . Coronary atherosclerosis    a. Minor at cardiac catheterization 2005 b. cath 10/16/2014 40% prox LAD dx, otherwise minimal CAD  . Depression   . Essential hypertension   . Glucose intolerance (impaired glucose tolerance)   . History of kidney stones   . Hyperlipidemia   . Pneumonia 2010  . Sick sinus syndrome ([facility])    [employer] PPM   Past Surgical History:  Past Surgical History:  Procedure Laterality Date  . ABDOMINAL HYSTERECTOMY    . APPENDECTOMY    . BREAST BIOPSY Bilateral    x 7 total  . CARDIAC CATHETERIZATION N/A 10/16/2014   Procedure: Left Heart Cath and Coronary Angiography;  Surgeon: Leonie Man, MD;  Location: [facility];  Service: Cardiovascular;  Laterality: N/A;  . CAROTID ENDARTERECTOMY Right 1999  . CATARACT EXTRACTION W/ INTRAOCULAR LENS  IMPLANT, BILATERAL Bilateral   . COLONOSCOPY     2006  . CYSTOSCOPY W/ URETERAL STENT PLACEMENT Left 04/19/2012   Procedure: CYSTOSCOPY WITH RETROGRADE PYELOGRAM/URETERAL STENT PLACEMENT ;  Surgeon: Ailene Rud, MD;  Location: [facility];  Service: Urology;  Laterality: Left;  . CYSTOSCOPY WITH RETROGRADE PYELOGRAM, URETEROSCOPY AND STENT PLACEMENT Left 06/30/2012   Procedure: CYSTOSCOPY WITH LEFT  RETROGRADE PYELOGRAM, URETEROSCOPY  with basketing of stone, AND STENT PLACEMENT, TRANSURETHRAL UNROOFING OF URETER.;  Surgeon: Alexis Frock, MD;  Location: [facility];  Service: Urology;  Laterality: Left;  . INSERT / REPLACE / REMOVE  PACEMAKER  2006  . OPEN REDUCTION INTERNAL FIXATION (ORIF) DISTAL RADIAL FRACTURE Right 04/29/2016   Procedure: OPEN REDUCTION INTERNAL FIXATION (ORIF) DISTAL RADIAL FRACTURE;  Surgeon: Roseanne Kaufman, MD;  Location: [facility];  Service: Orthopedics;  Laterality: Right;  . ORIF DISTAL RADIUS FRACTURE Right 04/29/2016   HPI:  Pt is an 83 y.o. female admitted with L facial droop. CT showed a R lentiform hemorrhage with mild surrounding edema. PMH includes: dementia (living in ILF), sick sinus syndrome, hyperlipidemia, essential hypertension, CAD, chronic anticoagulation, atrial fibrillation, carotid artery disease, PNA   Assessment / Plan / Recommendation Clinical Impression  Pt has left-sided facial weakness and decreased sensation, with anterior spillage, buccal pocketing, and intermittent coughing and throat clearing noted. Given the above as well as curretn cognitive status, recommend to proceed with MBS prior to diet initiation. Will plan for this afternoon. Would keep NPO except for meds crushed in puree prior to completion of further testing. SLP Visit Diagnosis: Dysphagia, unspecified (R13.10)    Aspiration Risk  Moderate aspiration risk    Diet Recommendation NPO except meds   Medication Administration: Crushed with puree    Other  Recommendations Oral Care Recommendations: Oral care QID Other Recommendations: Have oral suction available   Follow up Recommendations (tba)      Frequency and Duration            Prognosis Prognosis for Safe Diet Advancement: Good      Swallow Study   General HPI: Pt is an 83 y.o. female admitted with L  facial droop. CT showed a R lentiform hemorrhage with mild surrounding edema. PMH includes: dementia (living in ILF), sick sinus syndrome, hyperlipidemia, essential hypertension, CAD, chronic anticoagulation, atrial fibrillation, carotid artery disease, PNA Type of Study: Bedside Swallow Evaluation Previous Swallow Assessment: none in chart Diet  Prior to this Study: NPO Temperature Spikes Noted: No Respiratory Status: Nasal cannula History of Recent Intubation: No Behavior/Cognition: Alert;Cooperative;Pleasant mood;Confused Oral Cavity Assessment: Within Functional Limits Oral Care Completed by SLP: No Oral Cavity - Dentition: Adequate natural dentition Vision: Functional for self-feeding Self-Feeding Abilities: Able to feed self Patient Positioning: Upright in bed Baseline Vocal Quality: Normal Volitional Cough: Strong Volitional Swallow: Unable to elicit    Oral/Motor/Sensory Function Overall Oral Motor/Sensory Function: Moderate impairment Facial ROM: Reduced left;Suspected CN VII (facial) dysfunction Facial Symmetry: Abnormal symmetry left;Suspected CN VII (facial) dysfunction Facial Strength: Reduced left;Suspected CN VII (facial) dysfunction Facial Sensation: Reduced left;Suspected CN V (Trigeminal) dysfunction Lingual ROM: Reduced left;Suspected CN XII (hypoglossal) dysfunction Lingual Symmetry: Abnormal symmetry left;Suspected CN XII (hypoglossal) dysfunction(mild deviation to L)   Ice Chips Ice chips: Impaired Presentation: Spoon Pharyngeal Phase Impairments: Cough - Immediate   Thin Liquid Thin Liquid: Impaired Presentation: Cup;Self Fed;Straw Oral Phase Impairments: Reduced labial seal Oral Phase Functional Implications: Left anterior spillage Pharyngeal  Phase Impairments: Throat Clearing - Immediate;Cough - Delayed    Nectar Thick Nectar Thick Liquid: Not tested   Honey Thick Honey Thick Liquid: Not tested   Puree Puree: Impaired Presentation: Spoon Oral Phase Impairments: Reduced labial seal Oral Phase Functional Implications: Left anterior spillage   Solid     Solid: Not tested      Germain Osgood 12/11/2017,4:53 PM  Germain Osgood, M.A. [place] Acute Environmental education officer [phone] Office [phone]

## 2017-12-11 NOTE — Progress Notes (Signed)
Modified Barium Swallow Progress Note  Patient Details  Name: Traci [name] MRN: [id number] Date of Birth: 1934/12/19  Today's Date: 12/11/2017  Modified Barium Swallow completed.  Full report located under Chart Review in the Imaging Section.  Brief recommendations include the following:  Clinical Impression  Pt has a moderate oral and mild pharyngeal dysphagia with sensorimotor deficits. Orally she has weak labial seal and lingual manipulation, allowing premature spillage with thin liquids, saliva, and even a large piece of unmasticated cracker without pt awareness. She has mild-moderate L buccal pocketing, but can attend to this residue when given Min-Mod cues for lingual sweep. Her timing is mildly impaired for swallow trigger with thin liquids, likely at least in part related to decreased oral containement and/or decreased sensation, although no aspiration occurs. She consistently penetrates, with penetrates getting close to the true vocal folds, but penetrates clear the laryngeal vestibule upon completion of the swallow. Nectar thick liquids were also tested with no penetration observed. Recommend to start wtih Dys 1 diet and thin liquids with full supervision. Should pt experience any overt difficulty during meals, nectar thick liquids could be considered as a safer alternative. SLP will continue to follow for tolerance and readiness to advance.   Swallow Evaluation Recommendations       SLP Diet Recommendations: Dysphagia 1 (Puree) solids;Thin liquid   Liquid Administration via: Cup;Straw   Medication Administration: Crushed with puree   Supervision: Staff to assist with self feeding;Full supervision/cueing for compensatory strategies   Compensations: Slow rate;Small sips/bites;Minimize environmental distractions;Lingual sweep for clearance of pocketing;Monitor for anterior loss   Postural Changes: Seated upright at 90 degrees   Oral Care Recommendations: Oral care BID   Other  Recommendations: Have oral suction available    Germain Osgood 12/11/2017,5:30 PM   Germain Osgood, M.A. [place] Acute Environmental education officer [phone] Office [phone]

## 2017-12-11 NOTE — Progress Notes (Signed)
STROKE TEAM PROGRESS NOTE   HISTORY OF PRESENT ILLNESS (per record) Traci [name] is a 83 y.o. female with a past medical history of sick sinus syndrome, hyperlipidemia, essential hypertension, CAD, chronic anticoagulation, atrial fibrillation, carotid artery disease.  Per family patient was staying over at relatives house.  She Go to the bathroom around 5:00 in the morning and was fine.  Family member brought the dog out to go to the bathroom and when came back and she noted that patient had a facial droop.  EMS was called and patient was brought to [facility] stroke was called at that time.  Patient was found to have a right lentiform hemorrhage with estimated blood volume of 4 mL. Mild surrounding edema.  She was transferred to [facility] for further evaluation and care.  At current time patient is alert and oriented able to tell me she is at [facility].  Blood pressure is well controlled and patient is able to follow commands.  ED course patient was initially brought to [facility] where EKG, then CT was obtained CT showing a bleed.  Patient was on Coumadin thus she was given Kcentra along with vitamin K.  For blood pressure control patient was ordered labetalol and Cardene.  LKW: 5 AM on 12/10/2017 tpa given?: no, on anticoagulant Premorbid modified Rankin scale (mRS): 0 NIH stroke score 4 ICH Score: 1   SUBJECTIVE (INTERVAL HISTORY) Her RN is at the bedside.   His blood pressure is adequately controlled. His INR has been reversed with K Centra. And vitamin K and it is 1.23 this morning    OBJECTIVE Vitals:   12/11/17 0600 12/11/17 0700 12/11/17 0734 12/11/17 0800  BP: (!) 151/87 139/77  (!) 120/93  Pulse: 81 82    Resp: 12 18    Temp:   98.3 F (36.8 C)   TempSrc:   Oral   SpO2: 98% 97%    Weight:      Height:        CBC:  Recent Labs  Lab 12/10/17 0701  WBC 10.5  NEUTROABS 6.4  HGB 14.9  HCT 45.1  MCV 95.6  PLT 280    Basic  Metabolic Panel:  Recent Labs  Lab 12/10/17 0701 12/10/17 2336  NA 138 139  K 3.4* 3.6  CL 102 105  CO2 28 27  GLUCOSE 169* 170*  BUN 14 16  CREATININE 0.81 0.94  CALCIUM 9.1 8.7*  MG  --  1.8    Lipid Panel:     Component Value Date/Time   CHOL 161 04/20/2013 0909   TRIG 143 04/20/2013 0909   TRIG 176 04/30/2006   HDL 36 (L) 04/20/2013 0909   CHOLHDL 4.5 04/20/2013 0909   VLDL 29 04/20/2013 0909   LDLCALC 96 04/20/2013 0909   LDLCALC 100 04/30/2006   HgbA1c:  Lab Results  Component Value Date   HGBA1C 6.6 (H) 02/03/2016   Urine Drug Screen:     Component Value Date/Time   LABOPIA NONE DETECTED 12/10/2017 0649   COCAINSCRNUR NONE DETECTED 12/10/2017 0649   LABBENZ NONE DETECTED 12/10/2017 0649   AMPHETMU NONE DETECTED 12/10/2017 0649   THCU NONE DETECTED 12/10/2017 0649   LABBARB NONE DETECTED 12/10/2017 0649    Alcohol Level     Component Value Date/Time   ETH <10 12/10/2017 0701    IMAGING   Ct Head Wo Contrast 12/10/2017 IMPRESSION:  Right lenticular hematoma unchanged from earlier today. No intraventricular  hemorrhage or midline shift.   Dg Chest Port 1 View 12/11/2017 IMPRESSION:  Consolidation left lower lobe with small left pleural effusion. Right lung clear. There is stable cardiomegaly with aortic atherosclerosis. Pacemaker leads attached to right atrium and right ventricle. Bones osteoporotic. Aortic Atherosclerosis (ICD10-I70.0).\  Dg Chest Portable 1 View 12/10/2017 IMPRESSION:  Cardiac pacer with lead tips over the right atrium right ventricle. Cardiomegaly with diffuse bilateral from interstitial prominence and small left pleural effusion consistent with CHF.     Ct Head Code Stroke Wo Contrast 12/10/2017 IMPRESSION:  1. Small acute right lentiform hemorrhage with estimated blood volume of 4 mL. Mild surrounding edema. No ventricular or extra-axial extension.  2. Underlying chronic small vessel disease. Partially resolved left  scalp hematoma since October. Small chronic right tentorial meningioma.    PHYSICAL EXAM Blood pressure (!) 120/93, pulse 82, temperature 98.3 F (36.8 C), temperature source Oral, resp. rate 18, height 5\' 5"  (1.651 m), weight 74.8 kg, SpO2 97 %.  Pleasant elderly Caucasian female not in distress. . Afebrile. Head is nontraumatic. Neck is supple without bruit.    Cardiac exam no murmur or gallop. Lungs are clear to auscultation. Distal pulses are well felt.  Neurological Exam :  Awake alert oriented x 3 normal speech and language. Mild left lower face asymmetry. Tongue midline. Mild LUE and LLE drift. Mild left hemiparesis 4/5 strength.Mild diminished fine finger movements on left. Orbits right over left upper extremity. Mild left grip weak.. Diminished left hemibody sensation . Normal coordination. NIHSS 3      ASSESSMENT/PLAN Traci [name] is a 83 y.o. female with history of sick sinus syndrome, PPM, hyperlipidemia, essential hypertension, CAD, chronic anticoagulation - coumadin - INR 2.42 on admission, atrial fibrillation, carotid artery disease presenting with facial droop.  She did not receive IV t-PA due to [place]  Right lentiform hemorrhage - on coumadin  Resultant  Mild left hemiparesis  CT head - Right lenticular hematoma   MRI head - PPM  MRA head - PPM  CTA H&N - pending  Carotid Doppler - CTA neck performed - carotid dopplers not indicated.  2D Echo - not indicated  LDL - not performed  HgbA1c - not performed  UDS - negative  VTE prophylaxis - SCDs  Diet - NPO  warfarin daily prior to admission, now on No antithrombotic  Patient counseled to be compliant with her antithrombotic medications  Ongoing aggressive stroke risk factor management  Therapy recommendations:  pending  Disposition:  Pending  Hypertension  Stable . Long-term BP goal normotensive . Resume home meds  Hyperlipidemia  Lipid lowering medication PTA:  Pravachol  LDL not  performed, goal < 70  Current lipid lowering medication: none  Continue statin at discharge   Other Stroke Risk Factors  Advanced age  Overweight, Body mass index is 27.46 kg/m., recommend weight loss, diet and exercise as appropriate   Coronary artery disease   Other Active Problems    Plan  Resume home BP meds  CTA Head and Neck  PT and OT  Clear swallowing and start diet     Hospital day # 1  Mikey Bussing PA-C [facility] Pager [phone] 12/11/2017, 1:45 PM I have personally examined this patient, reviewed notes, independently viewed imaging studies, participated in medical decision making and plan of care.ROS completed by me personally and pertinent positives fully documented  I have made any additions or clarifications directly to the above note. Agree with note above.  She presented  with facial droop and left sided weakness secondary to small right basal ganglia hemorrhage likely hemorrhagic infarct while being on warfarin. Her INR has been reversed and blood pressure tightly controlled. Continue close neurological monitoring and strict blood pressure control as per protocol. Check CT angiogram has remained not be able to obtain MRI due to her pacemaker. Consider transfer out of the ICU tomorrow if stable. Mobilize out of bed. Therapy consults. Speech therapy consult for swallow. No family available at the bedside for discussion. May need to start aspirin at discharge and consider switching warfarin to eliquis in This patient is critically ill and at significant risk of neurological worsening, death and care requires constant monitoring of vital signs, hemodynamics,respiratory and cardiac monitoring, extensive review of multiple databases, frequent neurological assessment, discussion with family, other specialists and medical decision making of high complexity.I have made any additions or clarifications directly to the above note.This critical care  time does not reflect procedure time, or teaching time or supervisory time of PA/NP/Med Resident etc but could involve care discussion time.  I spent 40 minutes of neurocritical care time  in the care of  this patient.     Antony Contras, MD Medical Director [facility] Pager: [phone] 12/11/2017 5:28 PM   To contact Stroke Continuity provider, please refer to [email or web address] After hours, contact General Neurology

## 2017-12-11 NOTE — Progress Notes (Signed)
OT Cancellation Note  Patient Details Name: Traci [name] MRN: [id number] DOB: 02/02/1934   Cancelled Treatment:    Reason Eval/Treat Not Completed: Active bedrest order. Have contacted PA of attending for clarification. Golden Circle, OTR/L Acute Rehab Services Pager [phone] Office [phone]      Almon Register 12/11/2017, 11:14 AM

## 2017-12-12 ENCOUNTER — Inpatient Hospital Stay (HOSPITAL_COMMUNITY): Payer: PPO

## 2017-12-12 ENCOUNTER — Other Ambulatory Visit (HOSPITAL_COMMUNITY): Payer: PPO

## 2017-12-12 DIAGNOSIS — I10 Essential (primary) hypertension: Secondary | ICD-10-CM

## 2017-12-12 DIAGNOSIS — I340 Nonrheumatic mitral (valve) insufficiency: Secondary | ICD-10-CM

## 2017-12-12 DIAGNOSIS — I610 Nontraumatic intracerebral hemorrhage in hemisphere, subcortical: Principal | ICD-10-CM

## 2017-12-12 DIAGNOSIS — I4891 Unspecified atrial fibrillation: Secondary | ICD-10-CM

## 2017-12-12 DIAGNOSIS — F329 Major depressive disorder, single episode, unspecified: Secondary | ICD-10-CM

## 2017-12-12 DIAGNOSIS — E119 Type 2 diabetes mellitus without complications: Secondary | ICD-10-CM

## 2017-12-12 DIAGNOSIS — I495 Sick sinus syndrome: Secondary | ICD-10-CM

## 2017-12-12 DIAGNOSIS — I272 Pulmonary hypertension, unspecified: Secondary | ICD-10-CM

## 2017-12-12 DIAGNOSIS — R0602 Shortness of breath: Secondary | ICD-10-CM

## 2017-12-12 DIAGNOSIS — E876 Hypokalemia: Secondary | ICD-10-CM

## 2017-12-12 DIAGNOSIS — D72829 Elevated white blood cell count, unspecified: Secondary | ICD-10-CM

## 2017-12-12 DIAGNOSIS — I2510 Atherosclerotic heart disease of native coronary artery without angina pectoris: Secondary | ICD-10-CM

## 2017-12-12 DIAGNOSIS — Z7901 Long term (current) use of anticoagulants: Secondary | ICD-10-CM

## 2017-12-12 DIAGNOSIS — Z9981 Dependence on supplemental oxygen: Secondary | ICD-10-CM

## 2017-12-12 LAB — CBC
HCT: 39.1 % (ref 36.0–46.0)
Hemoglobin: 12.4 g/dL (ref 12.0–15.0)
MCH: 31 pg (ref 26.0–34.0)
MCHC: 31.7 g/dL (ref 30.0–36.0)
MCV: 97.8 fL (ref 80.0–100.0)
NRBC: 0 % (ref 0.0–0.2)
PLATELETS: 260 10*3/uL (ref 150–400)
RBC: 4 MIL/uL (ref 3.87–5.11)
RDW: 13.9 % (ref 11.5–15.5)
WBC: 13.2 10*3/uL — ABNORMAL HIGH (ref 4.0–10.5)

## 2017-12-12 LAB — ECHOCARDIOGRAM COMPLETE
HEIGHTINCHES: 65 in
WEIGHTICAEL: 2640 [oz_av]

## 2017-12-12 LAB — LIPID PANEL
Cholesterol: 170 mg/dL (ref 0–200)
HDL: 33 mg/dL — AB (ref >40)
LDL Cholesterol: 107 mg/dL — ABNORMAL HIGH (ref 0–99)
Total CHOL/HDL Ratio: 5.2 RATIO
Triglycerides: 148 mg/dL (ref <150)
VLDL: 30 mg/dL (ref 0–40)

## 2017-12-12 LAB — BASIC METABOLIC PANEL
ANION GAP: 8 (ref 5–15)
BUN: 16 mg/dL (ref 8–23)
CALCIUM: 9 mg/dL (ref 8.9–10.3)
CO2: 24 mmol/L (ref 22–32)
CREATININE: 0.9 mg/dL (ref 0.44–1.00)
Chloride: 107 mmol/L (ref 98–111)
GFR calc Af Amer: >60 mL/min (ref >60)
GFR, EST NON AFRICAN AMERICAN: 58 mL/min — AB (ref >60)
Glucose, Bld: 184 mg/dL — ABNORMAL HIGH (ref 70–99)
Potassium: 3.6 mmol/L (ref 3.5–5.1)
Sodium: 139 mmol/L (ref 135–145)

## 2017-12-12 LAB — GLUCOSE, CAPILLARY
Glucose-Capillary: 153 mg/dL — ABNORMAL HIGH (ref 70–99)
Glucose-Capillary: 163 mg/dL — ABNORMAL HIGH (ref 70–99)
Glucose-Capillary: 225 mg/dL — ABNORMAL HIGH (ref 70–99)

## 2017-12-12 LAB — HEMOGLOBIN A1C
HEMOGLOBIN A1C: 7.9 % — AB (ref 4.8–5.6)
MEAN PLASMA GLUCOSE: 180.03 mg/dL

## 2017-12-12 LAB — PROTIME-INR
INR: 1.24
PROTHROMBIN TIME: 15.5 s — AB (ref 11.4–15.2)

## 2017-12-12 MED ORDER — CLEVIDIPINE BUTYRATE 0.5 MG/ML IV EMUL
0.0000 mg/h | INTRAVENOUS | Status: DC
  Filled 2017-12-12: qty 50

## 2017-12-12 MED ORDER — QUETIAPINE FUMARATE 25 MG PO TABS
12.5000 mg | ORAL_TABLET | Freq: Every day | ORAL | Status: DC
  Administered 2017-12-12 – 2017-12-15 (×4): 12.5 mg via ORAL
  Filled 2017-12-12 (×4): qty 1

## 2017-12-12 MED ORDER — PANTOPRAZOLE SODIUM 40 MG PO TBEC
40.0000 mg | DELAYED_RELEASE_TABLET | Freq: Every day | ORAL | Status: DC
  Administered 2017-12-12 – 2017-12-16 (×5): 40 mg via ORAL
  Filled 2017-12-12 (×5): qty 1

## 2017-12-12 MED ORDER — FUROSEMIDE 20 MG PO TABS
10.0000 mg | ORAL_TABLET | Freq: Every day | ORAL | Status: DC
  Administered 2017-12-12: 10 mg via ORAL
  Filled 2017-12-12: qty 1

## 2017-12-12 MED ORDER — QUETIAPINE FUMARATE 25 MG PO TABS: 12.5000 mg | ORAL_TABLET | Freq: Every day | ORAL | Status: DC

## 2017-12-12 NOTE — Evaluation (Addendum)
Physical Therapy Evaluation Patient Details Name: Traci [name] MRN: [id number] DOB: 1934-11-14 Today's Date: 12/12/2017   History of Present Illness  Pt is an 82 y.o. female admitted with L facial droop. CT showed a R lentiform hemorrhage with mild surrounding edema. PMH includes: dementia (living in ILF), sick sinus syndrome, hyperlipidemia, essential hypertension, CAD, chronic anticoagulation, atrial fibrillation, carotid artery disease, PNA   Clinical Impression  Orders received for PT evaluation. Patient demonstrates deficits in functional mobility as indicated below. Will benefit from continued skilled PT to address deficits and maximize function. Will see as indicated and progress as tolerated. Prior to admission, patient was very independent with activity. Currently, patient requiring increased physical assist for all aspects of mobility. Feel patient would be an ideal candidate for comprehensive inpatient therapies. Recommending CIR consult at this time. Granddaughter was present at bedside earlier in the day, however, not present during this session therefore family education deferred. OF NOTE: attempted activity on room air with desaturations to 89%. Nsg aware.   Follow Up Recommendations CIR    Equipment Recommendations  (TBD)    Recommendations for Other Services Rehab consult     Precautions / Restrictions Precautions Precautions: Fall Restrictions Weight Bearing Restrictions: No      Mobility  Bed Mobility Overal bed mobility: Needs Assistance Bed Mobility: Supine to Sit     Supine to sit: Mod assist;HOB elevated     General bed mobility comments: coming out on left hand side of bed; VCs for use of rail, needed A to raise trunk up and to scoot to EOB  Transfers Overall transfer level: Needs assistance Equipment used: 2 person hand held assist Transfers: Sit to/from [facility] Sit to Stand: Mod assist;+2 physical assistance Stand pivot  transfers: Mod assist;+2 physical assistance       General transfer comment: Increased time to sit>stand and VCs need to take steps  Ambulation/Gait Ambulation/Gait assistance: Mod assist;+2 physical assistance Gait Distance (Feet): 7 Feet Assistive device: 2 person hand held assist Gait Pattern/deviations: Step-to pattern;Decreased stride length;Shuffle;Decreased weight shift to left;Trunk flexed Gait velocity: decreased Gait velocity interpretation: <1.31 ft/sec, indicative of household ambulator General Gait Details: wrap around support with gait belt and HHA. Posterior list and limited ability to maintain posture. Max multi modal Restaurant manager, fast food Rankin (Stroke Patients Only) Modified Rankin (Stroke Patients Only) Pre-Morbid Rankin Score: No symptoms Modified Rankin: Moderately severe disability     Balance Overall balance assessment: Needs assistance Sitting-balance support: Feet supported;No upper extremity supported Sitting balance-Leahy Scale: Poor Sitting balance - Comments: slow left lateral lean Postural control: Left lateral lean Standing balance support: Bilateral upper extremity supported Standing balance-Leahy Scale: Poor Standing balance comment: Bil HHA needed                             Pertinent Vitals/Pain Pain Assessment: No/denies pain    Home Living Family/patient expects to be discharged to:: Private residence Living Arrangements: Alone Available Help at Discharge: Family;Available 24 hours/day Type of Home: House Home Access: Level entry     Home Layout: Two level        Prior Function Level of Independence: Independent   Gait / Transfers Assistance Needed: Pt reports she walks without AD  ADL's / Homemaking Assistance Needed: Pt reports she does all of her basic ADLs by herself including all IADLs  Comments: Pt reports she normally drives     Hand Dominance   Dominant Hand:  Right    Extremity/Trunk Assessment   Upper Extremity Assessment Upper Extremity Assessment: LUE deficits/detail LUE Deficits / Details: slow movements compared to left LUE Sensation: decreased proprioception LUE Coordination: decreased fine motor;decreased gross motor    Lower Extremity Assessment Lower Extremity Assessment: LLE deficits/detail LLE Deficits / Details: noted LLE weakness assymetrical compared to RLE LLE Sensation: decreased proprioception LLE Coordination: decreased fine motor;decreased gross motor       Communication   Communication: (intermittent breathy talking--holds breath at times)  Cognition Arousal/Alertness: Awake/alert Behavior During Therapy: Flat affect Overall Cognitive Status: Impaired/Different from baseline Area of Impairment: Orientation;Attention;Memory;Following commands;Safety/judgement;Awareness;Problem solving                 Orientation Level: Disoriented to;Time Current Attention Level: Sustained Memory: Decreased short-term memory Following Commands: Follows one step commands consistently;Follows one step commands with increased time Safety/Judgement: Decreased awareness of safety;Decreased awareness of deficits Awareness: Intellectual Problem Solving: Slow processing;Decreased initiation;Difficulty sequencing;Requires verbal cues;Requires tactile cues General Comments: Multimodel cues for some activities (ie: handed washcloth but need multiple cues to use it)      General Comments      Exercises     Assessment/Plan    PT Assessment Patient needs continued PT services  PT Problem List Decreased strength;Decreased activity tolerance;Decreased balance;Decreased mobility;Decreased coordination;Decreased cognition;Decreased safety awareness;Cardiopulmonary status limiting activity       PT Treatment Interventions DME instruction;Gait training;Stair training;Functional mobility training;Therapeutic activities;Therapeutic  exercise;Balance training;Neuromuscular re-education;Cognitive remediation;Patient/family education    PT Goals (Current goals can be found in the Care Plan section)  Acute Rehab PT Goals Patient Stated Goal: hopeful to go back home PT Goal Formulation: With patient Time For Goal Achievement: 12/26/17 Potential to Achieve Goals: Good    Frequency Min 4X/week   Barriers to discharge        Co-evaluation   Reason for Co-Treatment: For patient/therapist safety PT goals addressed during session: Mobility/safety with mobility OT goals addressed during session: ADL's and self-care       AM-PAC PT "6 Clicks" Daily Activity  Outcome Measure Difficulty turning over in bed (including adjusting bedclothes, sheets and blankets)?: Unable Difficulty moving from lying on back to sitting on the side of the bed? : Unable Difficulty sitting down on and standing up from a chair with arms (e.g., wheelchair, bedside commode, etc,.)?: Unable Help needed moving to and from a bed to chair (including a wheelchair)?: A Lot Help needed walking in hospital room?: A Lot Help needed climbing 3-5 steps with a railing? : Total 6 Click Score: 8    End of Session Equipment Utilized During Treatment: Gait belt;Oxygen Activity Tolerance: Patient tolerated treatment well Patient left: in chair;with call bell/phone within reach;with chair alarm set Nurse Communication: Mobility status PT Visit Diagnosis: Muscle weakness (generalized) (M62.81);Other symptoms and signs involving the nervous system (R29.898)    Time: 3846-6599 PT Time Calculation (min) (ACUTE ONLY): 26 min   Charges:   PT Evaluation $PT Eval Moderate Complexity: 1 Mod          Alben Deeds, PT DPT  Board Certified Neurologic Specialist Acute Rehabilitation Services Pager [phone] Office [phone]   Duncan Dull 12/12/2017, 3:06 PM

## 2017-12-12 NOTE — Progress Notes (Signed)
STROKE TEAM PROGRESS NOTE   SUBJECTIVE (INTERVAL HISTORY) Her granddaughter is at the bedside.  Pt received ativan last night for agitation and sundowning.  Today during rounds, patient initially sleepy, but easily arousable and following commands and interactive.  Granddaughter has concerns about her history of COPD, abnormal chest x-ray, and IV fluid, as well as p.o. medications.  She was asking for internal medicine consultation, however, at this time I do not feel that is needed.    OBJECTIVE Vitals:   12/12/17 0809 12/12/17 0815 12/12/17 0830 12/12/17 0845  BP:  108/62 107/63 128/79  Pulse:  86 87 69  Resp:  15 15 15   Temp: 98.3 F (36.8 C)     TempSrc: Oral     SpO2:  93% 94% 94%  Weight:      Height:        CBC:  Recent Labs  Lab 12/10/17 0701 12/12/17 0305  WBC 10.5 13.2*  NEUTROABS 6.4  --   HGB 14.9 12.4  HCT 45.1 39.1  MCV 95.6 97.8  PLT 280 295    Basic Metabolic Panel:  Recent Labs  Lab 12/10/17 2336 12/12/17 0305  NA 139 139  K 3.6 3.6  CL 105 107  CO2 27 24  GLUCOSE 170* 184*  BUN 16 16  CREATININE 0.94 0.90  CALCIUM 8.7* 9.0  MG 1.8  --     Lipid Panel:     Component Value Date/Time   CHOL 170 12/12/2017 0305   TRIG 148 12/12/2017 0305   TRIG 176 04/30/2006   HDL 33 (L) 12/12/2017 0305   CHOLHDL 5.2 12/12/2017 0305   VLDL 30 12/12/2017 0305   LDLCALC 107 (H) 12/12/2017 0305   LDLCALC 100 04/30/2006   HgbA1c:  Lab Results  Component Value Date   HGBA1C 7.9 (H) 12/12/2017   Urine Drug Screen:     Component Value Date/Time   LABOPIA NONE DETECTED 12/10/2017 0649   COCAINSCRNUR NONE DETECTED 12/10/2017 0649   LABBENZ NONE DETECTED 12/10/2017 0649   AMPHETMU NONE DETECTED 12/10/2017 0649   THCU NONE DETECTED 12/10/2017 0649   LABBARB NONE DETECTED 12/10/2017 0649    Alcohol Level     Component Value Date/Time   ETH <10 12/10/2017 0701    IMAGING  Ct Head Wo Contrast 12/10/2017 IMPRESSION:  Right lenticular hematoma  unchanged from earlier today. No intraventricular hemorrhage or midline shift.   Dg Chest Port 1 View 12/11/2017 IMPRESSION:  Consolidation left lower lobe with small left pleural effusion. Right lung clear. There is stable cardiomegaly with aortic atherosclerosis. Pacemaker leads attached to right atrium and right ventricle. Bones osteoporotic. Aortic Atherosclerosis (ICD10-I70.0).\  Dg Chest Portable 1 View 12/10/2017 IMPRESSION:  Cardiac pacer with lead tips over the right atrium right ventricle. Cardiomegaly with diffuse bilateral from interstitial prominence and small left pleural effusion consistent with CHF.   Ct Head Code Stroke Wo Contrast 12/10/2017 IMPRESSION:  1. Small acute right lentiform hemorrhage with estimated blood volume of 4 mL. Mild surrounding edema. No ventricular or extra-axial extension.  2. Underlying chronic small vessel disease. Partially resolved left scalp hematoma since October. Small chronic right tentorial meningioma.  Ct Angio Head W Or Wo Contrast  Result Date: 12/11/2017 CLINICAL DATA:  Follow-up examination for known intracranial hemorrhage. Patient with right lentiform intraparenchymal hematoma. EXAM: CT ANGIOGRAPHY HEAD AND NECK TECHNIQUE: Multidetector CT imaging of the head and neck was performed using the standard protocol during bolus administration of intravenous contrast. Multiplanar CT image reconstructions and MIPs  were obtained to evaluate the vascular anatomy. Carotid stenosis measurements (when applicable) are obtained utilizing NASCET criteria, using the distal internal carotid diameter as the denominator. CONTRAST:  60mL ISOVUE-370 IOPAMIDOL (ISOVUE-370) INJECTION 76% COMPARISON:  Prior CT from 12/10/2017 as well as earlier studies. FINDINGS: CTA NECK FINDINGS Aortic arch: Visualized aortic arch of normal caliber with normal branch pattern. Moderate atherosclerotic change about the aortic arch and origin of the great vessels without  hemodynamically significant stenosis. Visualized subclavian arteries widely patent. Right carotid system: Scattered non stenotic plaque within the right common carotid artery without significant narrowing. Concentric calcified plaque about the right bifurcation/proximal right ICA without hemodynamically significant stenosis. Probable remote changes related to prior endarterectomy noted. Right ICA patent from the bifurcation to the skull base without stenosis, dissection, or occlusion. Left carotid system: Left common carotid artery patent from its origin to the bifurcation without significant stenosis. Left common carotid artery medialized into the retropharyngeal space. Scattered a centric calcified plaque about the left bifurcation/proximal left ICA with associated stenosis of up to approximately 50% by NASCET criteria. Left ICA widely patent distally to the skull base without stenosis, dissection, or occlusion. Vertebral arteries: Both of the vertebral arteries arise from the subclavian arteries. The left vertebral artery appears to be slightly dominant. Left vertebral artery essentially occludes just beyond its origin, and remains occluded to approximately the level of C4-5. Distal reconstitution likely via muscular branches. Irregular attenuated flow seen distally within the left vertebral artery which is otherwise patent to the skull base. The right vertebral artery is occluded at its origin. Irregular distal reconstitution at approximately the level of C5-6 (series 8, image 231). Irregular thready and attenuated flow seen distally throughout the right ICA with multifocal moderate to severe segmental stenoses. Right vertebral artery is nearly occluded as it reaches the cranial vault (series 8, image 165). Skeleton: No acute osseus abnormality. No discrete lytic or blastic osseous lesions. Moderate cervical spondylolysis noted at C4-5 through C6-7. Other neck: No acute soft tissue abnormality within the neck.  Chronic left maxillary sinusitis noted. Salivary glands within normal limits. No adenopathy. Thyroid within normal limits. Upper chest: Streak artifact from left-sided pacemaker/AICD. Left-sided pleural effusion with associated atelectasis partially visualized. Additional scattered atelectatic changes noted within the visualized lungs. Review of the MIP images confirms the above findings CTA HEAD FINDINGS Anterior circulation: Petrous segments widely patent bilaterally. Advanced atheromatous plaque within the cavernous/supraclinoid ICAs with moderate multifocal narrowing. ICA termini widely patent. Left A1 segment irregular but patent without high-grade stenosis. Severe diffuse stenosis of the right A1 segment. Normal anterior communicating artery. Extensive atheromatous irregularity throughout the ACAs, left greater than right were there are multifocal moderate to severe stenoses. Left M1 irregular but widely patent without high-grade stenosis. Focal moderate proximal right M1 stenosis noted (series 8, image 92). Normal MCA bifurcations. No proximal M2 occlusion. Extensive small vessel atheromatous irregularity throughout the MCA branches bilaterally which are well perfused and fairly symmetric. Posterior circulation: Multifocal atheromatous irregularity within the dominant left V4 segment which is patent to the vertebrobasilar junction without high-grade stenosis. Patent left PICA. Diminutive right vertebral artery nearly occluded at the skull base, with scant irregular flow seen within the right V4 segment. Right V4 occludes prior to the vertebrobasilar junction. Right PICA of not seen. Basilar artery somewhat diminutive. Severe atheromatous change throughout the mid and distal basilar artery with multifocal severe stenoses. Anterior inferior cerebral arteries patent bilaterally. Superior cerebral arteries grossly patent. Predominant fetal type origin of the PCAs with patent  and robust posterior communicating  arteries. PCAs are patent to their distal aspects with multifocal moderate to severe distal P3 and P4 stenoses. Venous sinuses: Not well assessed due to arterial timing of the contrast bolus. Anatomic variants: Fetal type origin of the PCAs with underlying diminutive vertebrobasilar system. Focal outpouching at the cavernous left ICA in the region of the left hypophyseal artery favored to reflect a normal vascular infundibulum. No AVM or other vascular abnormality seen underlying the right lentiform hemorrhage. Delayed phase: No abnormal enhancement. Right lentiform nucleus hemorrhage relatively stable measuring 13 x 14 mm. No intraventricular extension. Review of the MIP images confirms the above findings IMPRESSION: 1. No significant interval change in size and appearance right lenticular hematoma. No underlying vascular abnormality identified. 2. Severe vertebrobasilar atherosclerotic disease as above. Proximal-mid vertebral arteries are occluded within the neck with distal reconstitution. Right vertebral subsequently occludes at the skull base. Multifocal severe stenoses seen throughout the basilar artery. Overall, vertebrobasilar system is diminutive with fetal type origin of the PCAs. 3. Approximate 50% atheromatous stenosis at the left carotid bifurcation. 4. Sequelae of remote right carotid endarterectomy, with no significant residual or recurrent stenosis. 5. Additional extensive atherosclerotic change throughout the intracranial circulation as above. No large vessel occlusion. Electronically Signed   By: Jeannine Boga M.D.   On: 12/11/2017 17:12   Ct Angio Neck W Or Wo Contrast  Result Date: 12/11/2017 CLINICAL DATA:  Follow-up examination for known intracranial hemorrhage. Patient with right lentiform intraparenchymal hematoma. EXAM: CT ANGIOGRAPHY HEAD AND NECK TECHNIQUE: Multidetector CT imaging of the head and neck was performed using the standard protocol during bolus administration of  intravenous contrast. Multiplanar CT image reconstructions and MIPs were obtained to evaluate the vascular anatomy. Carotid stenosis measurements (when applicable) are obtained utilizing NASCET criteria, using the distal internal carotid diameter as the denominator. CONTRAST:  14mL ISOVUE-370 IOPAMIDOL (ISOVUE-370) INJECTION 76% COMPARISON:  Prior CT from 12/10/2017 as well as earlier studies. FINDINGS: CTA NECK FINDINGS Aortic arch: Visualized aortic arch of normal caliber with normal branch pattern. Moderate atherosclerotic change about the aortic arch and origin of the great vessels without hemodynamically significant stenosis. Visualized subclavian arteries widely patent. Right carotid system: Scattered non stenotic plaque within the right common carotid artery without significant narrowing. Concentric calcified plaque about the right bifurcation/proximal right ICA without hemodynamically significant stenosis. Probable remote changes related to prior endarterectomy noted. Right ICA patent from the bifurcation to the skull base without stenosis, dissection, or occlusion. Left carotid system: Left common carotid artery patent from its origin to the bifurcation without significant stenosis. Left common carotid artery medialized into the retropharyngeal space. Scattered a centric calcified plaque about the left bifurcation/proximal left ICA with associated stenosis of up to approximately 50% by NASCET criteria. Left ICA widely patent distally to the skull base without stenosis, dissection, or occlusion. Vertebral arteries: Both of the vertebral arteries arise from the subclavian arteries. The left vertebral artery appears to be slightly dominant. Left vertebral artery essentially occludes just beyond its origin, and remains occluded to approximately the level of C4-5. Distal reconstitution likely via muscular branches. Irregular attenuated flow seen distally within the left vertebral artery which is otherwise patent  to the skull base. The right vertebral artery is occluded at its origin. Irregular distal reconstitution at approximately the level of C5-6 (series 8, image 231). Irregular thready and attenuated flow seen distally throughout the right ICA with multifocal moderate to severe segmental stenoses. Right vertebral artery is nearly occluded as it reaches  the cranial vault (series 8, image 165). Skeleton: No acute osseus abnormality. No discrete lytic or blastic osseous lesions. Moderate cervical spondylolysis noted at C4-5 through C6-7. Other neck: No acute soft tissue abnormality within the neck. Chronic left maxillary sinusitis noted. Salivary glands within normal limits. No adenopathy. Thyroid within normal limits. Upper chest: Streak artifact from left-sided pacemaker/AICD. Left-sided pleural effusion with associated atelectasis partially visualized. Additional scattered atelectatic changes noted within the visualized lungs. Review of the MIP images confirms the above findings CTA HEAD FINDINGS Anterior circulation: Petrous segments widely patent bilaterally. Advanced atheromatous plaque within the cavernous/supraclinoid ICAs with moderate multifocal narrowing. ICA termini widely patent. Left A1 segment irregular but patent without high-grade stenosis. Severe diffuse stenosis of the right A1 segment. Normal anterior communicating artery. Extensive atheromatous irregularity throughout the ACAs, left greater than right were there are multifocal moderate to severe stenoses. Left M1 irregular but widely patent without high-grade stenosis. Focal moderate proximal right M1 stenosis noted (series 8, image 92). Normal MCA bifurcations. No proximal M2 occlusion. Extensive small vessel atheromatous irregularity throughout the MCA branches bilaterally which are well perfused and fairly symmetric. Posterior circulation: Multifocal atheromatous irregularity within the dominant left V4 segment which is patent to the vertebrobasilar  junction without high-grade stenosis. Patent left PICA. Diminutive right vertebral artery nearly occluded at the skull base, with scant irregular flow seen within the right V4 segment. Right V4 occludes prior to the vertebrobasilar junction. Right PICA of not seen. Basilar artery somewhat diminutive. Severe atheromatous change throughout the mid and distal basilar artery with multifocal severe stenoses. Anterior inferior cerebral arteries patent bilaterally. Superior cerebral arteries grossly patent. Predominant fetal type origin of the PCAs with patent and robust posterior communicating arteries. PCAs are patent to their distal aspects with multifocal moderate to severe distal P3 and P4 stenoses. Venous sinuses: Not well assessed due to arterial timing of the contrast bolus. Anatomic variants: Fetal type origin of the PCAs with underlying diminutive vertebrobasilar system. Focal outpouching at the cavernous left ICA in the region of the left hypophyseal artery favored to reflect a normal vascular infundibulum. No AVM or other vascular abnormality seen underlying the right lentiform hemorrhage. Delayed phase: No abnormal enhancement. Right lentiform nucleus hemorrhage relatively stable measuring 13 x 14 mm. No intraventricular extension. Review of the MIP images confirms the above findings IMPRESSION: 1. No significant interval change in size and appearance right lenticular hematoma. No underlying vascular abnormality identified. 2. Severe vertebrobasilar atherosclerotic disease as above. Proximal-mid vertebral arteries are occluded within the neck with distal reconstitution. Right vertebral subsequently occludes at the skull base. Multifocal severe stenoses seen throughout the basilar artery. Overall, vertebrobasilar system is diminutive with fetal type origin of the PCAs. 3. Approximate 50% atheromatous stenosis at the left carotid bifurcation. 4. Sequelae of remote right carotid endarterectomy, with no  significant residual or recurrent stenosis. 5. Additional extensive atherosclerotic change throughout the intracranial circulation as above. No large vessel occlusion. Electronically Signed   By: Jeannine Boga M.D.   On: 12/11/2017 17:12     PHYSICAL EXAM  Temp:  [97.7 F (36.5 C)-98.3 F (36.8 C)] 97.8 F (36.6 C) (11/16 1152) Pulse Rate:  [46-109] 81 (11/16 1130) Resp:  [11-20] 14 (11/16 1130) BP: (107-154)/(62-140) 136/75 (11/16 1130) SpO2:  [89 %-100 %] 97 % (11/16 1130)  General - Well nourished, well developed, mildly sleepy.  Ophthalmologic - fundi not visualized due to noncooperation.  Cardiovascular - irregularly irregular heart rate and rhythm..  Mental Status -  Level  of arousal and orientation to place, age and person were intact, but not orientated to time. Language including expression, naming, repetition, comprehension was assessed and found intact. Mild dysarthria  Cranial Nerves II - XII - II - Visual field intact OU. III, IV, VI - Extraocular movements intact. V - Facial sensation intact bilaterally. VII - left facial droop. VIII - Hearing & vestibular intact bilaterally. X - Palate elevates symmetrically. Mild dysarthria XI - Chin turning & shoulder shrug intact bilaterally. XII - Tongue protrusion intact.  Motor Strength - The patient's strength was normal in all extremities except mild LUE pronator drift and decreased finger dexterity.  Bulk was normal and fasciculations were absent.   Motor Tone - Muscle tone was assessed at the neck and appendages and was normal.  Reflexes - The patient's reflexes were symmetrical in all extremities and she had no pathological reflexes.  Sensory - Light touch, temperature/pinprick were assessed and were symmetrical.    Coordination - The patient had normal movements in the hands but slow on the left.  Tremor was absent.  Gait and Station - deferred.   ASSESSMENT/PLAN Ms. RENNA KILMER is a 83 y.o. female  with history of sick sinus syndrome s/p PPM, hyperlipidemia, essential hypertension, CAD, chronic anticoagulation - coumadin - INR 2.42 on admission, atrial fibrillation, carotid artery disease presenting with facial droop and slurry speech.  She did not receive IV t-PA due to [place]  ICH - Right lentiform hemorrhage likely due to HTN on coumadin  Resultant  Mild left arm hemiparesis and left facial droop  CT head - Right lenticular small hematoma   MRI head - PPM  MRA head - PPM  CT repeat stable hematoma  CTA H&N  Posterior circulation hypoplastic, b/l fetal PCAs, diffuse athero, right s/p CEA, left ICA 50% stenosis  2D Echo -EF 60 to 65%, mild pulmonary hypertension  LDL - 107  HgbA1c 7.9  UDS - negative  VTE prophylaxis - SCDs  warfarin daily prior to admission, reversed with Kcentra, now on No antithrombotic  Ongoing aggressive stroke risk factor management  Therapy recommendations:  CIR  Disposition:  Pending  Chronic Afib on coumadin  On Coumadin at home  Patient has refused DOACs in the past as per granddaughter  INR 2.42 on admission  Reversed with Kcentra  INR 1.27-1.24  Hold off anticoagulation for now due to [place]  We will discuss with patient and family and consider DOACs for anticoagulation once ICH resolves  CHF with pleural effusion  2D echo EF 60 to 65%  Chest x-ray suggest CHF  Encourage p.o. Intake  Decrease IV fluid  Resume home Lasix  Close monitoring  Hypertension  Stable  On and off cardene  Will switch to cleviprex due to lower volume  BP goal less than 160 . Long-term BP goal normotensive . Resume home meds including Cardizem, losartan, metoprolol  Hyperlipidemia  Lipid lowering medication PTA:  Pravachol  LDL 107, goal < 70  Resume pravastatin on discharge  Agitation and sundowning  Received Ativan last night  Put on low-dose Seroquel tonight  Close monitoring  Other Stroke Risk Factors  Advanced  age  Coronary artery disease  Carotid artery disease status post right CEA and left ICA 50% stenosis  Other Active Problems  COPD  SSS status post pacemaker  Hospital day # 2   This patient is critically ill and at significant risk of neurological worsening, death and care requires constant monitoring of vital signs, hemodynamics,respiratory and cardiac  monitoring, extensive review of multiple databases, frequent neurological assessment, discussion with family, other specialists and medical decision making of high complexity. I spent 45 minutes of neurocritical care time  in the care of  this patient and answered granddaughters questions at bedside.   Rosalin Hawking, MD PhD Stroke Neurology 12/12/2017 5:15 PM   To contact Stroke Continuity provider, please refer to [email or web address] After hours, contact General Neurology

## 2017-12-12 NOTE — Evaluation (Signed)
Occupational Therapy Evaluation Patient Details Name: ELLENA KAMEN MRN: [id number] DOB: 03/09/1934 Today's Date: 12/12/2017    History of Present Illness Pt is an 83 y.o. female admitted with L facial droop. CT showed a R lentiform hemorrhage with mild surrounding edema. PMH includes: dementia (living in ILF), sick sinus syndrome, hyperlipidemia, essential hypertension, CAD, chronic anticoagulation, atrial fibrillation, carotid artery disease, PNA    Clinical Impression   This 83 yo female admitted with above presents to acute OT with PLOF per her report being totally independent with basic ADLs, IADLs., and driving. Now presenting at mod A-total A for basic ADLs. She will benefit from acute OT with follow up OT on CIR to work back towards her PLOF.    Follow Up Recommendations  CIR;Supervision/Assistance - 24 hour    Equipment Recommendations  Other (comment)(TBD at next venue)       Precautions / Restrictions Precautions Precautions: Fall Restrictions Weight Bearing Restrictions: No      Mobility Bed Mobility Overal bed mobility: Needs Assistance Bed Mobility: Supine to Sit     Supine to sit: Mod assist;HOB elevated     General bed mobility comments: coming out on left hand side of bed; VCs for use of rail, needed A to raise trunk up and to scoot to EOB  Transfers Overall transfer level: Needs assistance Equipment used: 2 person hand held assist Transfers: Sit to/from [facility] Sit to Stand: Mod assist;+2 physical assistance Stand pivot transfers: Mod assist;+2 physical assistance       General transfer comment: Increased time to sit>stand and VCs need to take steps    Balance Overall balance assessment: Needs assistance Sitting-balance support: Feet supported;No upper extremity supported Sitting balance-Leahy Scale: Poor Sitting balance - Comments: slow left lateral lean Postural control: Left lateral lean Standing balance support: Bilateral  upper extremity supported Standing balance-Leahy Scale: Poor Standing balance comment: Bil HHA needed                           ADL either performed or assessed with clinical judgement   ADL Overall ADL's : Needs assistance/impaired Eating/Feeding: Moderate assistance Eating/Feeding Details (indicate cue type and reason): supported sitting Grooming: Moderate assistance Grooming Details (indicate cue type and reason): supported sitting Upper Body Bathing: Moderate assistance Upper Body Bathing Details (indicate cue type and reason): supported sitting Lower Body Bathing: Maximal assistance Lower Body Bathing Details (indicate cue type and reason): Mod A +2 sit<>stand Upper Body Dressing : Maximal assistance Upper Body Dressing Details (indicate cue type and reason): supported sitting Lower Body Dressing: Maximal assistance Lower Body Dressing Details (indicate cue type and reason): Mod A +2 sit<>stand Toilet Transfer: Moderate assistance;+2 for safety/equipment;Stand-pivot;BSC   Toileting- Clothing Manipulation and Hygiene: Total assistance Toileting - Clothing Manipulation Details (indicate cue type and reason): Mod A +2 sit<>stand             Vision Baseline Vision/History: (wears contacts all the time) Patient Visual Report: No change from baseline Vision Assessment?: Yes Eye Alignment: Within Functional Limits Alignment/Gaze Preference: Within Defined Limits Tracking/Visual Pursuits: (decreased smoothness throughout and difficulty tracking)            Pertinent Vitals/Pain Pain Assessment: No/denies pain     Hand Dominance Right   Extremity/Trunk Assessment Upper Extremity Assessment Upper Extremity Assessment: LUE deficits/detail LUE Deficits / Details: slow movements compared to left LUE Sensation: decreased proprioception LUE Coordination: decreased fine motor;decreased gross motor  Communication Communication Communication:  (intermittent breathy talking--holds breath at times)   Cognition Arousal/Alertness: Awake/alert Behavior During Therapy: Flat affect Overall Cognitive Status: Impaired/Different from baseline Area of Impairment: Orientation;Attention;Memory;Following commands;Safety/judgement;Awareness;Problem solving                 Orientation Level: Disoriented to;Time Current Attention Level: Sustained Memory: Decreased short-term memory Following Commands: Follows one step commands consistently;Follows one step commands with increased time Safety/Judgement: Decreased awareness of safety;Decreased awareness of deficits Awareness: Intellectual Problem Solving: Slow processing;Decreased initiation;Difficulty sequencing;Requires verbal cues;Requires tactile cues General Comments: Multimodel cues for some activities (ie: handed washcloth but need multiple cues to use it)              Home Living Family/patient expects to be discharged to:: Private residence Living Arrangements: Alone Available Help at Discharge: Family;Available 24 hours/day Type of Home: House Home Access: Level entry     Home Layout: Two level         Bathroom Toilet: Standard                Prior Functioning/Environment Level of Independence: Independent  Gait / Transfers Assistance Needed: Pt reports she walks without AD ADL's / Homemaking Assistance Needed: Pt reports she does all of her basic ADLs by herself including all IADLs   Comments: Pt reports she normally drives        OT Problem List: Decreased strength;Decreased range of motion;Decreased activity tolerance;Impaired balance (sitting and/or standing);Decreased safety awareness;Decreased cognition;Impaired UE functional use;Decreased coordination;Impaired vision/perception;Decreased knowledge of use of DME or AE      OT Treatment/Interventions: Self-care/ADL training;Balance training;Neuromuscular education;Therapeutic activities;DME and/or  AE instruction;Patient/family education;Visual/perceptual remediation/compensation    OT Goals(Current goals can be found in the care plan section) Acute Rehab OT Goals Patient Stated Goal: hopeful to go back home OT Goal Formulation: With patient Time For Goal Achievement: 12/26/17 Potential to Achieve Goals: Good  OT Frequency: Min 3X/week   Barriers to D/C: Decreased caregiver support          Co-evaluation PT/OT/SLP Co-Evaluation/Treatment: Yes Reason for Co-Treatment: For patient/therapist safety;To address functional/ADL transfers PT goals addressed during session: Mobility/safety with mobility;Balance;Strengthening/ROM OT goals addressed during session: ADL's and self-care;Strengthening/ROM      AM-PAC PT "6 Clicks" Daily Activity     Outcome Measure Help from another person eating meals?: A Lot Help from another person taking care of personal grooming?: A Lot Help from another person toileting, which includes using toliet, bedpan, or urinal?: A Lot Help from another person bathing (including washing, rinsing, drying)?: A Lot Help from another person to put on and taking off regular upper body clothing?: A Lot Help from another person to put on and taking off regular lower body clothing?: A Lot 6 Click Score: 12   End of Session Equipment Utilized During Treatment: Gait belt Nurse Communication: Mobility status(holding breath alot and odd breathing with activity)  Activity Tolerance: Patient tolerated treatment well Patient left: in chair;with call bell/phone within reach;with chair alarm set  OT Visit Diagnosis: Unsteadiness on feet (R26.81);Other abnormalities of gait and mobility (R26.89);Muscle weakness (generalized) (M62.81);Hemiplegia and hemiparesis Hemiplegia - Right/Left: Left Hemiplegia - dominant/non-dominant: Non-Dominant Hemiplegia - caused by: Nontraumatic intracerebral hemorrhage                Time: 5056-9794 OT Time Calculation (min): 27  min Charges:  OT General Charges $OT Visit: 1 Visit OT Evaluation $OT Eval Moderate Complexity: 1 Mod  [address], OTR/L Acute [facility] [phone] Office [phone]  Almon Register 12/12/2017, 2:08 PM

## 2017-12-12 NOTE — Progress Notes (Signed)
  Echocardiogram 2D Echocardiogram has been performed.  Merrie Roof F 12/12/2017, 4:00 PM

## 2017-12-12 NOTE — Progress Notes (Signed)
Rehab Admissions Coordinator Note:  Patient was screened by Cleatrice Burke for appropriateness for an Inpatient Acute Rehab Consult per PT and OT recommendations.   At this time, we are recommending Inpatient Rehab consult.  Cleatrice Burke 12/12/2017, 4:39 PM  I can be reached at [phone]

## 2017-12-13 ENCOUNTER — Inpatient Hospital Stay (HOSPITAL_COMMUNITY): Payer: PPO

## 2017-12-13 DIAGNOSIS — R0602 Shortness of breath: Secondary | ICD-10-CM

## 2017-12-13 DIAGNOSIS — F32A Depression, unspecified: Secondary | ICD-10-CM

## 2017-12-13 DIAGNOSIS — I272 Pulmonary hypertension, unspecified: Secondary | ICD-10-CM

## 2017-12-13 DIAGNOSIS — E876 Hypokalemia: Secondary | ICD-10-CM

## 2017-12-13 DIAGNOSIS — I501 Left ventricular failure: Secondary | ICD-10-CM

## 2017-12-13 DIAGNOSIS — R0603 Acute respiratory distress: Secondary | ICD-10-CM

## 2017-12-13 DIAGNOSIS — I10 Essential (primary) hypertension: Secondary | ICD-10-CM

## 2017-12-13 DIAGNOSIS — D72829 Elevated white blood cell count, unspecified: Secondary | ICD-10-CM

## 2017-12-13 DIAGNOSIS — Z7901 Long term (current) use of anticoagulants: Secondary | ICD-10-CM

## 2017-12-13 DIAGNOSIS — I495 Sick sinus syndrome: Secondary | ICD-10-CM

## 2017-12-13 DIAGNOSIS — Z9981 Dependence on supplemental oxygen: Secondary | ICD-10-CM

## 2017-12-13 DIAGNOSIS — F329 Major depressive disorder, single episode, unspecified: Secondary | ICD-10-CM

## 2017-12-13 DIAGNOSIS — R9389 Abnormal findings on diagnostic imaging of other specified body structures: Secondary | ICD-10-CM

## 2017-12-13 DIAGNOSIS — E119 Type 2 diabetes mellitus without complications: Secondary | ICD-10-CM

## 2017-12-13 LAB — BASIC METABOLIC PANEL
ANION GAP: 8 (ref 5–15)
BUN: 12 mg/dL (ref 8–23)
CO2: 25 mmol/L (ref 22–32)
Calcium: 9 mg/dL (ref 8.9–10.3)
Chloride: 108 mmol/L (ref 98–111)
Creatinine, Ser: 0.83 mg/dL (ref 0.44–1.00)
GFR calc Af Amer: >60 mL/min (ref >60)
GFR calc non Af Amer: >60 mL/min (ref >60)
GLUCOSE: 131 mg/dL — AB (ref 70–99)
Potassium: 3.3 mmol/L — ABNORMAL LOW (ref 3.5–5.1)
Sodium: 141 mmol/L (ref 135–145)

## 2017-12-13 LAB — CBC
HEMATOCRIT: 36.4 % (ref 36.0–46.0)
Hemoglobin: 11.7 g/dL — ABNORMAL LOW (ref 12.0–15.0)
MCH: 31.2 pg (ref 26.0–34.0)
MCHC: 32.1 g/dL (ref 30.0–36.0)
MCV: 97.1 fL (ref 80.0–100.0)
NRBC: 0 % (ref 0.0–0.2)
Platelets: 230 10*3/uL (ref 150–400)
RBC: 3.75 MIL/uL — AB (ref 3.87–5.11)
RDW: 13.8 % (ref 11.5–15.5)
WBC: 12.3 10*3/uL — ABNORMAL HIGH (ref 4.0–10.5)

## 2017-12-13 LAB — PROTIME-INR
INR: 1.15
Prothrombin Time: 14.6 seconds (ref 11.4–15.2)

## 2017-12-13 MED ORDER — ACETAMINOPHEN 160 MG/5ML PO SOLN: 650.0000 mg | Freq: Four times a day (QID) | ORAL | Status: DC | PRN

## 2017-12-13 MED ORDER — IPRATROPIUM-ALBUTEROL 0.5-2.5 (3) MG/3ML IN SOLN
RESPIRATORY_TRACT | Status: AC
  Administered 2017-12-13: 3 mL via RESPIRATORY_TRACT
  Filled 2017-12-13: qty 3

## 2017-12-13 MED ORDER — ACETAMINOPHEN 650 MG RE SUPP: 650.0000 mg | Freq: Four times a day (QID) | RECTAL | Status: DC | PRN

## 2017-12-13 MED ORDER — POTASSIUM CHLORIDE CRYS ER 20 MEQ PO TBCR
40.0000 meq | EXTENDED_RELEASE_TABLET | Freq: Every day | ORAL | Status: DC
  Administered 2017-12-13 – 2017-12-16 (×4): 40 meq via ORAL
  Filled 2017-12-13 (×5): qty 2

## 2017-12-13 MED ORDER — ACETAMINOPHEN 325 MG PO TABS
650.0000 mg | ORAL_TABLET | Freq: Four times a day (QID) | ORAL | Status: DC | PRN
  Administered 2017-12-13 – 2017-12-16 (×3): 650 mg via ORAL
  Filled 2017-12-13 (×3): qty 2

## 2017-12-13 MED ORDER — IPRATROPIUM-ALBUTEROL 0.5-2.5 (3) MG/3ML IN SOLN
3.0000 mL | Freq: Once | RESPIRATORY_TRACT | Status: AC
  Administered 2017-12-13: 3 mL via RESPIRATORY_TRACT

## 2017-12-13 MED ORDER — FUROSEMIDE 10 MG/ML IJ SOLN
40.0000 mg | Freq: Every day | INTRAMUSCULAR | Status: DC
  Administered 2017-12-13: 40 mg via INTRAVENOUS
  Filled 2017-12-13: qty 4

## 2017-12-13 MED ORDER — LOSARTAN POTASSIUM 50 MG PO TABS
50.0000 mg | ORAL_TABLET | Freq: Two times a day (BID) | ORAL | Status: DC
  Administered 2017-12-13 – 2017-12-16 (×7): 50 mg via ORAL
  Filled 2017-12-13 (×7): qty 1

## 2017-12-13 MED ORDER — BUTALBITAL-APAP-CAFFEINE 50-325-40 MG PO TABS
1.0000 | ORAL_TABLET | Freq: Two times a day (BID) | ORAL | Status: DC | PRN
  Administered 2017-12-13 – 2017-12-14 (×2): 1 via ORAL
  Filled 2017-12-13 (×2): qty 1

## 2017-12-13 MED ORDER — FUROSEMIDE 20 MG PO TABS
20.0000 mg | ORAL_TABLET | Freq: Every day | ORAL | Status: DC
Start: 2017-12-13 — End: 2017-12-13
  Administered 2017-12-13: 20 mg via ORAL
  Filled 2017-12-13: qty 1

## 2017-12-13 NOTE — Progress Notes (Signed)
  Speech Language Pathology Treatment: Dysphagia  Patient Details Name: Traci [name] MRN: [id number] DOB: 1934/04/11 Today's Date: 12/13/2017 Time: 1001-1013 SLP Time Calculation (min) (ACUTE ONLY): 12 min  Assessment / Plan / Recommendation Clinical Impression  Per RN, patient with question of observed aspiration event with thin liquids, has been coughing with increasing SOB. Lungs sound wheezy on auscultation. CXR complete and shows worsening left basilar consolidation and small left pleural effusion as well as worsening aeration of the right lower lobe. Diagnostic treatment complete at bedside. Patient very lethargic with arousable with cues. Able to consume nectar thick liquid trials (which were not penetrated/aspirated on most recent MBS) and pureed solids without observed signs of aspiration, mild oral holding due to lethargy, requiring almost constant verbal and tactile cueing from clinician to maintain adequate level of arousal. Recommend downgrading diet at this point to maximize safety. Prognosis for ability to re-advance good with improved alertness and overall medical condition.    HPI HPI: Pt is an 83 y.o. female admitted with L facial droop. CT showed a R lentiform hemorrhage with mild surrounding edema. PMH includes: dementia (living in ILF), sick sinus syndrome, hyperlipidemia, essential hypertension, CAD, chronic anticoagulation, atrial fibrillation, carotid artery disease, PNA      SLP Plan  Continue with current plan of care       Recommendations  Diet recommendations: Dysphagia 1 (puree);Nectar-thick liquid Liquids provided via: Cup;No straw Medication Administration: Crushed with puree Supervision: Staff to assist with self feeding;Full supervision/cueing for compensatory strategies Compensations: Slow rate;Small sips/bites Postural Changes and/or Swallow Maneuvers: Seated upright 90 degrees                Oral Care Recommendations: Oral care BID Follow up  Recommendations: Inpatient Rehab SLP Visit Diagnosis: Dysphagia, oropharyngeal phase (R13.12) Plan: Continue with current plan of care                    [facility] MA, [place] 12/13/2017, 10:19 AM

## 2017-12-13 NOTE — Progress Notes (Deleted)
CRITICAL VALUE ALERT  Critical Value:  Lactic Acid 2.6  Date & Time Notied:  12/13/17 at 0908  Provider Notified: Dr. Halford Chessman

## 2017-12-13 NOTE — Progress Notes (Signed)
STROKE TEAM PROGRESS NOTE   SUBJECTIVE (INTERVAL HISTORY) No family is at the bedside.  Patient is sitting in bed, asking to go to chair with help.  RN stated that patient had good sleep last night after Seroquel, seems more sleepy this morning but now awake alert, orientated.  No SOB, wheezing or crackles both lungs.  BP stable, off IV Cardene/Cleviprex.  Had cough last night, concerning for pneumonia, had CXR showed right lower base decreased aeration.   OBJECTIVE Vitals:   12/13/17 0700 12/13/17 0715 12/13/17 0730 12/13/17 0745  BP: (!) 162/89 139/83 135/82 (!) 150/83  Pulse: 94 77 79 78  Resp: 17 15 16 18   Temp:      TempSrc:      SpO2: 98% 99% 98% 93%  Weight:      Height:        CBC:  Recent Labs  Lab 12/10/17 0701 12/12/17 0305 12/13/17 0344  WBC 10.5 13.2* 12.3*  NEUTROABS 6.4  --   --   HGB 14.9 12.4 11.7*  HCT 45.1 39.1 36.4  MCV 95.6 97.8 97.1  PLT 280 260 124    Basic Metabolic Panel:  Recent Labs  Lab 12/10/17 2336 12/12/17 0305 12/13/17 0344  NA 139 139 141  K 3.6 3.6 3.3*  CL 105 107 108  CO2 27 24 25   GLUCOSE 170* 184* 131*  BUN 16 16 12   CREATININE 0.94 0.90 0.83  CALCIUM 8.7* 9.0 9.0  MG 1.8  --   --     Lipid Panel:     Component Value Date/Time   CHOL 170 12/12/2017 0305   TRIG 148 12/12/2017 0305   TRIG 176 04/30/2006   HDL 33 (L) 12/12/2017 0305   CHOLHDL 5.2 12/12/2017 0305   VLDL 30 12/12/2017 0305   LDLCALC 107 (H) 12/12/2017 0305   LDLCALC 100 04/30/2006   HgbA1c:  Lab Results  Component Value Date   HGBA1C 7.9 (H) 12/12/2017   Urine Drug Screen:     Component Value Date/Time   LABOPIA NONE DETECTED 12/10/2017 0649   COCAINSCRNUR NONE DETECTED 12/10/2017 0649   LABBENZ NONE DETECTED 12/10/2017 0649   AMPHETMU NONE DETECTED 12/10/2017 0649   THCU NONE DETECTED 12/10/2017 0649   LABBARB NONE DETECTED 12/10/2017 0649    Alcohol Level     Component Value Date/Time   ETH <10 12/10/2017 0701    IMAGING  Ct Head  Wo Contrast 12/10/2017 IMPRESSION:  Right lenticular hematoma unchanged from earlier today. No intraventricular hemorrhage or midline shift.   Dg Chest Port 1 View 12/11/2017 IMPRESSION:  Consolidation left lower lobe with small left pleural effusion. Right lung clear. There is stable cardiomegaly with aortic atherosclerosis. Pacemaker leads attached to right atrium and right ventricle. Bones osteoporotic. Aortic Atherosclerosis (ICD10-I70.0).\  Dg Chest Portable 1 View 12/10/2017 IMPRESSION:  Cardiac pacer with lead tips over the right atrium right ventricle. Cardiomegaly with diffuse bilateral from interstitial prominence and small left pleural effusion consistent with CHF.   Ct Head Code Stroke Wo Contrast 12/10/2017 IMPRESSION:  1. Small acute right lentiform hemorrhage with estimated blood volume of 4 mL. Mild surrounding edema. No ventricular or extra-axial extension.  2. Underlying chronic small vessel disease. Partially resolved left scalp hematoma since October. Small chronic right tentorial meningioma.  Ct Angio Head W Or Wo Contrast  Result Date: 12/11/2017 CLINICAL DATA:  Follow-up examination for known intracranial hemorrhage. Patient with right lentiform intraparenchymal hematoma. EXAM: CT ANGIOGRAPHY HEAD AND NECK TECHNIQUE: Multidetector CT imaging of  the head and neck was performed using the standard protocol during bolus administration of intravenous contrast. Multiplanar CT image reconstructions and MIPs were obtained to evaluate the vascular anatomy. Carotid stenosis measurements (when applicable) are obtained utilizing NASCET criteria, using the distal internal carotid diameter as the denominator. CONTRAST:  90mL ISOVUE-370 IOPAMIDOL (ISOVUE-370) INJECTION 76% COMPARISON:  Prior CT from 12/10/2017 as well as earlier studies. FINDINGS: CTA NECK FINDINGS Aortic arch: Visualized aortic arch of normal caliber with normal branch pattern. Moderate atherosclerotic change about  the aortic arch and origin of the great vessels without hemodynamically significant stenosis. Visualized subclavian arteries widely patent. Right carotid system: Scattered non stenotic plaque within the right common carotid artery without significant narrowing. Concentric calcified plaque about the right bifurcation/proximal right ICA without hemodynamically significant stenosis. Probable remote changes related to prior endarterectomy noted. Right ICA patent from the bifurcation to the skull base without stenosis, dissection, or occlusion. Left carotid system: Left common carotid artery patent from its origin to the bifurcation without significant stenosis. Left common carotid artery medialized into the retropharyngeal space. Scattered a centric calcified plaque about the left bifurcation/proximal left ICA with associated stenosis of up to approximately 50% by NASCET criteria. Left ICA widely patent distally to the skull base without stenosis, dissection, or occlusion. Vertebral arteries: Both of the vertebral arteries arise from the subclavian arteries. The left vertebral artery appears to be slightly dominant. Left vertebral artery essentially occludes just beyond its origin, and remains occluded to approximately the level of C4-5. Distal reconstitution likely via muscular branches. Irregular attenuated flow seen distally within the left vertebral artery which is otherwise patent to the skull base. The right vertebral artery is occluded at its origin. Irregular distal reconstitution at approximately the level of C5-6 (series 8, image 231). Irregular thready and attenuated flow seen distally throughout the right ICA with multifocal moderate to severe segmental stenoses. Right vertebral artery is nearly occluded as it reaches the cranial vault (series 8, image 165). Skeleton: No acute osseus abnormality. No discrete lytic or blastic osseous lesions. Moderate cervical spondylolysis noted at C4-5 through C6-7. Other  neck: No acute soft tissue abnormality within the neck. Chronic left maxillary sinusitis noted. Salivary glands within normal limits. No adenopathy. Thyroid within normal limits. Upper chest: Streak artifact from left-sided pacemaker/AICD. Left-sided pleural effusion with associated atelectasis partially visualized. Additional scattered atelectatic changes noted within the visualized lungs. Review of the MIP images confirms the above findings CTA HEAD FINDINGS Anterior circulation: Petrous segments widely patent bilaterally. Advanced atheromatous plaque within the cavernous/supraclinoid ICAs with moderate multifocal narrowing. ICA termini widely patent. Left A1 segment irregular but patent without high-grade stenosis. Severe diffuse stenosis of the right A1 segment. Normal anterior communicating artery. Extensive atheromatous irregularity throughout the ACAs, left greater than right were there are multifocal moderate to severe stenoses. Left M1 irregular but widely patent without high-grade stenosis. Focal moderate proximal right M1 stenosis noted (series 8, image 92). Normal MCA bifurcations. No proximal M2 occlusion. Extensive small vessel atheromatous irregularity throughout the MCA branches bilaterally which are well perfused and fairly symmetric. Posterior circulation: Multifocal atheromatous irregularity within the dominant left V4 segment which is patent to the vertebrobasilar junction without high-grade stenosis. Patent left PICA. Diminutive right vertebral artery nearly occluded at the skull base, with scant irregular flow seen within the right V4 segment. Right V4 occludes prior to the vertebrobasilar junction. Right PICA of not seen. Basilar artery somewhat diminutive. Severe atheromatous change throughout the mid and distal basilar artery with multifocal  severe stenoses. Anterior inferior cerebral arteries patent bilaterally. Superior cerebral arteries grossly patent. Predominant fetal type origin of the  PCAs with patent and robust posterior communicating arteries. PCAs are patent to their distal aspects with multifocal moderate to severe distal P3 and P4 stenoses. Venous sinuses: Not well assessed due to arterial timing of the contrast bolus. Anatomic variants: Fetal type origin of the PCAs with underlying diminutive vertebrobasilar system. Focal outpouching at the cavernous left ICA in the region of the left hypophyseal artery favored to reflect a normal vascular infundibulum. No AVM or other vascular abnormality seen underlying the right lentiform hemorrhage. Delayed phase: No abnormal enhancement. Right lentiform nucleus hemorrhage relatively stable measuring 13 x 14 mm. No intraventricular extension. Review of the MIP images confirms the above findings IMPRESSION: 1. No significant interval change in size and appearance right lenticular hematoma. No underlying vascular abnormality identified. 2. Severe vertebrobasilar atherosclerotic disease as above. Proximal-mid vertebral arteries are occluded within the neck with distal reconstitution. Right vertebral subsequently occludes at the skull base. Multifocal severe stenoses seen throughout the basilar artery. Overall, vertebrobasilar system is diminutive with fetal type origin of the PCAs. 3. Approximate 50% atheromatous stenosis at the left carotid bifurcation. 4. Sequelae of remote right carotid endarterectomy, with no significant residual or recurrent stenosis. 5. Additional extensive atherosclerotic change throughout the intracranial circulation as above. No large vessel occlusion. Electronically Signed   By: Jeannine Boga M.D.   On: 12/11/2017 17:12   Ct Angio Neck W Or Wo Contrast  Result Date: 12/11/2017 CLINICAL DATA:  Follow-up examination for known intracranial hemorrhage. Patient with right lentiform intraparenchymal hematoma. EXAM: CT ANGIOGRAPHY HEAD AND NECK TECHNIQUE: Multidetector CT imaging of the head and neck was performed using the  standard protocol during bolus administration of intravenous contrast. Multiplanar CT image reconstructions and MIPs were obtained to evaluate the vascular anatomy. Carotid stenosis measurements (when applicable) are obtained utilizing NASCET criteria, using the distal internal carotid diameter as the denominator. CONTRAST:  3mL ISOVUE-370 IOPAMIDOL (ISOVUE-370) INJECTION 76% COMPARISON:  Prior CT from 12/10/2017 as well as earlier studies. FINDINGS: CTA NECK FINDINGS Aortic arch: Visualized aortic arch of normal caliber with normal branch pattern. Moderate atherosclerotic change about the aortic arch and origin of the great vessels without hemodynamically significant stenosis. Visualized subclavian arteries widely patent. Right carotid system: Scattered non stenotic plaque within the right common carotid artery without significant narrowing. Concentric calcified plaque about the right bifurcation/proximal right ICA without hemodynamically significant stenosis. Probable remote changes related to prior endarterectomy noted. Right ICA patent from the bifurcation to the skull base without stenosis, dissection, or occlusion. Left carotid system: Left common carotid artery patent from its origin to the bifurcation without significant stenosis. Left common carotid artery medialized into the retropharyngeal space. Scattered a centric calcified plaque about the left bifurcation/proximal left ICA with associated stenosis of up to approximately 50% by NASCET criteria. Left ICA widely patent distally to the skull base without stenosis, dissection, or occlusion. Vertebral arteries: Both of the vertebral arteries arise from the subclavian arteries. The left vertebral artery appears to be slightly dominant. Left vertebral artery essentially occludes just beyond its origin, and remains occluded to approximately the level of C4-5. Distal reconstitution likely via muscular branches. Irregular attenuated flow seen distally within  the left vertebral artery which is otherwise patent to the skull base. The right vertebral artery is occluded at its origin. Irregular distal reconstitution at approximately the level of C5-6 (series 8, image 231). Irregular thready and attenuated flow  seen distally throughout the right ICA with multifocal moderate to severe segmental stenoses. Right vertebral artery is nearly occluded as it reaches the cranial vault (series 8, image 165). Skeleton: No acute osseus abnormality. No discrete lytic or blastic osseous lesions. Moderate cervical spondylolysis noted at C4-5 through C6-7. Other neck: No acute soft tissue abnormality within the neck. Chronic left maxillary sinusitis noted. Salivary glands within normal limits. No adenopathy. Thyroid within normal limits. Upper chest: Streak artifact from left-sided pacemaker/AICD. Left-sided pleural effusion with associated atelectasis partially visualized. Additional scattered atelectatic changes noted within the visualized lungs. Review of the MIP images confirms the above findings CTA HEAD FINDINGS Anterior circulation: Petrous segments widely patent bilaterally. Advanced atheromatous plaque within the cavernous/supraclinoid ICAs with moderate multifocal narrowing. ICA termini widely patent. Left A1 segment irregular but patent without high-grade stenosis. Severe diffuse stenosis of the right A1 segment. Normal anterior communicating artery. Extensive atheromatous irregularity throughout the ACAs, left greater than right were there are multifocal moderate to severe stenoses. Left M1 irregular but widely patent without high-grade stenosis. Focal moderate proximal right M1 stenosis noted (series 8, image 92). Normal MCA bifurcations. No proximal M2 occlusion. Extensive small vessel atheromatous irregularity throughout the MCA branches bilaterally which are well perfused and fairly symmetric. Posterior circulation: Multifocal atheromatous irregularity within the dominant  left V4 segment which is patent to the vertebrobasilar junction without high-grade stenosis. Patent left PICA. Diminutive right vertebral artery nearly occluded at the skull base, with scant irregular flow seen within the right V4 segment. Right V4 occludes prior to the vertebrobasilar junction. Right PICA of not seen. Basilar artery somewhat diminutive. Severe atheromatous change throughout the mid and distal basilar artery with multifocal severe stenoses. Anterior inferior cerebral arteries patent bilaterally. Superior cerebral arteries grossly patent. Predominant fetal type origin of the PCAs with patent and robust posterior communicating arteries. PCAs are patent to their distal aspects with multifocal moderate to severe distal P3 and P4 stenoses. Venous sinuses: Not well assessed due to arterial timing of the contrast bolus. Anatomic variants: Fetal type origin of the PCAs with underlying diminutive vertebrobasilar system. Focal outpouching at the cavernous left ICA in the region of the left hypophyseal artery favored to reflect a normal vascular infundibulum. No AVM or other vascular abnormality seen underlying the right lentiform hemorrhage. Delayed phase: No abnormal enhancement. Right lentiform nucleus hemorrhage relatively stable measuring 13 x 14 mm. No intraventricular extension. Review of the MIP images confirms the above findings IMPRESSION: 1. No significant interval change in size and appearance right lenticular hematoma. No underlying vascular abnormality identified. 2. Severe vertebrobasilar atherosclerotic disease as above. Proximal-mid vertebral arteries are occluded within the neck with distal reconstitution. Right vertebral subsequently occludes at the skull base. Multifocal severe stenoses seen throughout the basilar artery. Overall, vertebrobasilar system is diminutive with fetal type origin of the PCAs. 3. Approximate 50% atheromatous stenosis at the left carotid bifurcation. 4. Sequelae of  remote right carotid endarterectomy, with no significant residual or recurrent stenosis. 5. Additional extensive atherosclerotic change throughout the intracranial circulation as above. No large vessel occlusion. Electronically Signed   By: Jeannine Boga M.D.   On: 12/11/2017 17:12   Ct Chest Wo Contrast  Result Date: 12/13/2017 CLINICAL DATA:  Rales on auscultation. EXAM: CT CHEST WITHOUT CONTRAST TECHNIQUE: Multidetector CT imaging of the chest was performed following the standard protocol without IV contrast. COMPARISON:  08/19/2011. Portable chest obtained earlier today. FINDINGS: Cardiovascular: Enlarged heart due to left atrial and left ventricular enlargement. Atheromatous calcifications, including  the coronary arteries and aorta. Small pericardial effusion with a maximum thickness of 8 mm. Left subclavian pacemaker leads in satisfactory position. Mediastinum/Nodes: No enlarged mediastinal or axillary lymph nodes. Thyroid gland, trachea, and esophagus demonstrate no significant findings. Lungs/Pleura: Small bilateral pleural effusions. Mild bilateral dependent atelectasis. Prominent pulmonary vasculature and interstitial markings. Upper Abdomen: Dense retained barium in the colon with associated streak artifacts. Musculoskeletal: Old, healed right rib fractures. Thoracic and lower cervical spine degenerative changes. IMPRESSION: 1. Changes of acute congestive heart failure with small bilateral pleural effusions and bilateral dependent atelectasis. 2.  Calcific coronary artery and aortic atherosclerosis. Aortic Atherosclerosis (ICD10-I70.0). Electronically Signed   By: Claudie Revering M.D.   On: 12/13/2017 17:02   Dg Chest Port 1 View  Result Date: 12/13/2017 CLINICAL DATA:  Shortness of breath EXAM: PORTABLE CHEST 1 VIEW COMPARISON:  12/11/2017 FINDINGS: Left chest wall pacemaker is unchanged. There is moderate cardiomegaly with calcific aortic atherosclerosis. Consolidation at the left lung  base with small pleural effusion has slightly worsened from the prior study. There is worsened aeration of the right lower lobe. IMPRESSION: 1. Worsening left basilar consolidation and small left pleural effusion. 2. Worsening aeration of the right lower lobe. Electronically Signed   By: Ulyses Jarred M.D.   On: 12/13/2017 03:26    PHYSICAL EXAM  Temp:  [97.7 F (36.5 C)-98.4 F (36.9 C)] 98.1 F (36.7 C) (11/17 0400) Pulse Rate:  [59-150] 78 (11/17 0745) Resp:  [9-21] 18 (11/17 0745) BP: (85-189)/(59-147) 150/83 (11/17 0745) SpO2:  [92 %-100 %] 93 % (11/17 0745)  General - Well nourished, well developed, mildly agitated.  Ophthalmologic - fundi not visualized due to noncooperation.  Cardiovascular - irregularly irregular heart rate and rhythm..  Mental Status -  Level of arousal and orientation to place, age and person were intact, but not orientated to time. Language including expression, naming, repetition, comprehension was assessed and found intact. Mild dysarthria  Cranial Nerves II - XII - II - Visual field intact OU. III, IV, VI - Extraocular movements intact. V - Facial sensation intact bilaterally. VII - left facial droop. VIII - Hearing & vestibular intact bilaterally. X - Palate elevates symmetrically. Mild dysarthria XI - Chin turning & shoulder shrug intact bilaterally. XII - Tongue protrusion intact.  Motor Strength - The patient's strength was normal in all extremities except mild LUE pronator drift and decreased finger dexterity.  Bulk was normal and fasciculations were absent.   Motor Tone - Muscle tone was assessed at the neck and appendages and was normal.  Reflexes - The patient's reflexes were symmetrical in all extremities and she had no pathological reflexes.  Sensory - Light touch, temperature/pinprick were assessed and were symmetrical.    Coordination - The patient had normal movements in the hands but slow on the left.  Tremor was absent.  Gait  and Station - deferred.   ASSESSMENT/PLAN Ms. MYRIKAL MESSMER is a 82 y.o. female with history of sick sinus syndrome s/p PPM, hyperlipidemia, essential hypertension, CAD, chronic anticoagulation - coumadin - INR 2.42 on admission, atrial fibrillation, carotid artery disease presenting with facial droop and slurry speech.  She did not receive IV t-PA due to [place]  ICH - Right lentiform hemorrhage likely due to HTN on coumadin  Resultant  Mild left arm hemiparesis and left facial droop  CT head - Right lenticular small hematoma   MRI head - PPM  MRA head - PPM  CT repeat stable hematoma  CTA H&N  Posterior  circulation hypoplastic, b/l fetal PCAs, diffuse athero, right s/p CEA, left ICA 50% stenosis  2D Echo -EF 60 to 65%, mild pulmonary hypertension  LDL - 107  HgbA1c 7.9  UDS - negative  VTE prophylaxis - SCDs  warfarin daily prior to admission, reversed with Kcentra, now on No antithrombotic  Ongoing aggressive stroke risk factor management  Therapy recommendations:  CIR  Disposition:  Pending  Chronic Afib on coumadin  On Coumadin at home  Patient has refused DOACs in the past as per granddaughter  INR 2.42 on admission  Reversed with Kcentra  INR 1.27-1.24  Hold off anticoagulation for now due to [place]  We will discuss with patient and family and consider DOACs for anticoagulation once ICH resolves  CHF with small pleural effusion  2D echo EF 60 to 65%  Chest x-ray suggest CHF  Encourage p.o. Intake  Decrease IV fluid @ 35  Resume home Lasix  Close monitoring  Hypertension  Stable  off cleviprex  BP goal less than 160 . Long-term BP goal normotensive . Resume home meds including Cardizem, losartan, metoprolol  Hyperlipidemia  Lipid lowering medication PTA:  Pravachol  LDL 107, goal < 70  Resume pravastatin on discharge  Agitation and sundowning  on low-dose Seroquel tonight  On effexor home dose  Close monitoring  Other Stroke  Risk Factors  Advanced age  Coronary artery disease  Carotid artery disease status post right CEA and left ICA 50% stenosis  Other Active Problems  COPD  SSS status post pacemaker  Hospital day # 3   This patient is critically ill and at significant risk of neurological worsening, death and care requires constant monitoring of vital signs, hemodynamics,respiratory and cardiac monitoring, extensive review of multiple databases, frequent neurological assessment, discussion with family, other specialists and medical decision making of high complexity. I spent 40 minutes of neurocritical care time  in the care of  this patient and answered granddaughters questions at bedside. I had long discussion with granddaughter over the phone, updated pt current condition, treatment plan and potential prognosis.  Granddaughter requested medical team consultation for CHF, I told her that patient CHF symptoms are too mild to call for consultation.  However granddaughter expressed knowledge on competency of neurology team in taking care of of patient cardiac and respiratory issue.  The conversation made me very uncomfortable to continue taking care of this patient.  Therefore I discussed with internal medicine team Dr. Jamse Arn, asking her to currently consult on this patient and potentially transfer to internal medicine services tomorrow.  Rosalin Hawking, MD PhD Stroke Neurology 12/13/2017 7:22 PM     To contact Stroke Continuity provider, please refer to [email or web address] After hours, contact General Neurology

## 2017-12-13 NOTE — Consult Note (Signed)
Medical Consultation   Traci [name]  AST:[id number]  DOB: 10/19/1934  DOA: 12/10/2017  PCP: Traci Squibb, MD   Requesting physician: Dr [name]  Reason for consultation: Family concern for chest x-ray abnormality  History of Present Illness: [name] is an 83 y.o. female past medical history significant for atrial fibrillation, coronary artery disease, peripheral vascular disease, hypertension, diabetes type 2 and mild dementia who was admittedon November 14 for management of right lentiform hemorrhage thought to be secondary to hypertension. Of note patient was on Coumadin. Patient was admitted with blood pressure control and reversal of Coumadin and has done well over the past 3 days.  [facility] are now consulted by Dr. Rigoberto Noel per request of patient's granddaughter who is an ICU nurse at [name] and is concerned about patient's chest x-ray results. She has been very concerned about her grandmother's respiratory status and increasing oxygen requirement and feels these need to be managed by an internist rather than a neurologist.  History is per patient, patient's son and daughter in Sports coach and Therapist, sports. Patient herself states she feels very sleepy but does admit to some SOB recently but does not know who long she has been SOB. She denies SOB at present, states she would like to go back to sleepy. Patients family notes she is does not wear oxygen at home and is able to get around in the house and to/from the car. She does not walk much other than that, is not able to go shopping but does go to dinner out and to church. She does have noticeable memory difficulties.   Further history from daughter-in-law [name] reveals the patient has had rested DOE or the past several weeks and was placed on Lasix 20 mg by mouth by her PCP without too much improvement in her baseline shortness of breath.  RN notes she had some difficulty initiating eating yesterday, she  had a chocking episode when starting on thin liquids and O2 requirements increased to 4 liters, up from 1 liter. CXR showed consolidation of L base with pleural effusion and decreased aeration R base.  Review of Systems:  ROS As per HPI otherwise 10 point review of systems negative.  Unacceptable ROS statements: "10 systems reviewed," "Extensive" (without elaboration).  Acceptable ROS statements: "All others negative," "All others reviewed and are negative," and "All others unremarkable," with at [place] documented Can't double dip - if using for HPI can't use for ROS   Past Medical History: Past Medical History:  Diagnosis Date  . Arthritis   . Atrial fibrillation ([place])   . Carotid artery disease ([place])    Right carotid endarectomy 1999  . Chronic anticoagulation    Followed by PMD  . Coronary atherosclerosis    a. Minor at cardiac catheterization 2005 b. cath 10/16/2014 40% prox LAD dx, otherwise minimal CAD  . Depression   . Essential hypertension   . Glucose intolerance (impaired glucose tolerance)   . History of kidney stones   . Hyperlipidemia   . Pneumonia 2010  . Sick sinus syndrome (HCC)    [employer] PPM    Past Surgical History: Past Surgical History:  Procedure Laterality Date  . ABDOMINAL HYSTERECTOMY    . APPENDECTOMY    . BREAST BIOPSY Bilateral    x 7 total  . CARDIAC CATHETERIZATION N/A 10/16/2014   Procedure: Left Heart Cath and Coronary Angiography;  Surgeon: Leonie Man, MD;  Location: [facility];  Service: Cardiovascular;  Laterality: N/A;  . CAROTID ENDARTERECTOMY Right 1999  . CATARACT EXTRACTION W/ INTRAOCULAR LENS  IMPLANT, BILATERAL Bilateral   . COLONOSCOPY     2006  . CYSTOSCOPY W/ URETERAL STENT PLACEMENT Left 04/19/2012   Procedure: CYSTOSCOPY WITH RETROGRADE PYELOGRAM/URETERAL STENT PLACEMENT ;  Surgeon: Ailene Rud, MD;  Location: [facility];  Service: Urology;  Laterality: Left;  . CYSTOSCOPY WITH RETROGRADE PYELOGRAM,  URETEROSCOPY AND STENT PLACEMENT Left 06/30/2012   Procedure: CYSTOSCOPY WITH LEFT  RETROGRADE PYELOGRAM, URETEROSCOPY  with basketing of stone, AND STENT PLACEMENT, TRANSURETHRAL UNROOFING OF URETER.;  Surgeon: Alexis Frock, MD;  Location: [facility];  Service: Urology;  Laterality: Left;  . INSERT / REPLACE / REMOVE PACEMAKER  2006  . OPEN REDUCTION INTERNAL FIXATION (ORIF) DISTAL RADIAL FRACTURE Right 04/29/2016   Procedure: OPEN REDUCTION INTERNAL FIXATION (ORIF) DISTAL RADIAL FRACTURE;  Surgeon: Roseanne Kaufman, MD;  Location: [facility];  Service: Orthopedics;  Laterality: Right;  . ORIF DISTAL RADIUS FRACTURE Right 04/29/2016     Allergies:   Allergies  Allergen Reactions  . Morphine Nausea Only  . Penicillins Other (See Comments)    Has patient had a PCN reaction causing immediate rash, facial/tongue/throat swelling, SOB or lightheadedness with hypotension: NO Has patient had a PCN reaction causing severe rash involving mucus membranes or skin necrosis: no Has patient had a PCN reaction that required hospitalization: NO Has patient had a PCN reaction occurring within the last 10 years: NO If all of the above answers are "NO", then may proceed with Cephalosporin use.      Social History:  reports that she has never smoked. She has never used smokeless tobacco. She reports that she does not drink alcohol or use drugs.   Family History: Family History  Problem Relation Age of Onset  . Heart attack Mother        Died age 22  . Hyperlipidemia Mother   . Diabetes Mother   . Coronary artery disease Father   . Alcohol abuse Father   . Hyperlipidemia Father   . Diabetes Sister   . Hypertension Sister   . Breast cancer Sister   . Breast cancer Sister     Unacceptable: Noncontributory, unremarkable, or negative. Acceptable: Family history reviewed and not pertinent (If you reviewed it)   Physical Exam: Vitals:   12/13/17 0900 12/13/17 1000 12/13/17 1100 12/13/17 1124  BP: 133/70  (!) 147/96 (!) 143/76   Pulse: 82 95 83   Resp: 16 18 16    Temp:    98.4 F (36.9 C)  TempSrc:    Oral  SpO2: 98% 98% 99%   Weight:      Height:        Constitutional: Sleeping patient lying at 20 angle in no acute respiratory distresswith nasal cannula in place. She is easily arousable by voice alone. Eyes: clear anicteric ENMT: mucous membranes are moist CVS: Irregular, without murmurs. Respiratory:  Decreased aeration left base with some rales at the right base. Otherwise no wheezes and reasonable air entry bilaterally. Abdomen:NABS, obese, soft nontender to light or deep palpation. No rebound tenderness. Musculoskeletal: : SCDs in place. No edema  Data reviewed:  I have personally reviewed following labs and imaging studies Labs:  CBC: Recent Labs  Lab 12/10/17 0701 12/12/17 0305 12/13/17 0344  WBC 10.5 13.2* 12.3*  NEUTROABS 6.4  --   --   HGB 14.9 12.4 11.7*  HCT  45.1 39.1 36.4  MCV 95.6 97.8 97.1  PLT 280 260 102    Basic Metabolic Panel: Recent Labs  Lab 12/10/17 0701 12/10/17 2336 12/12/17 0305 12/13/17 0344  NA 138 139 139 141  K 3.4* 3.6 3.6 3.3*  CL 102 105 107 108  CO2 28 27 24 25   GLUCOSE 169* 170* 184* 131*  BUN 14 16 16 12   CREATININE 0.81 0.94 0.90 0.83  CALCIUM 9.1 8.7* 9.0 9.0  MG  --  1.8  --   --    GFR Estimated Creatinine Clearance: 53.8 mL/min (by C-G formula based on SCr of 0.83 mg/dL). Liver Function Tests: Recent Labs  Lab 12/10/17 0701  AST 21  ALT 17  ALKPHOS 86  BILITOT 0.7  PROT 7.5  ALBUMIN 3.6   No results for input(s): LIPASE, AMYLASE in the last 168 hours. No results for input(s): AMMONIA in the last 168 hours. Coagulation profile Recent Labs  Lab 12/10/17 1556 12/10/17 2248 12/11/17 0349 12/12/17 0305 12/13/17 0344  INR 1.34 1.31 1.27 1.24 1.15    Cardiac Enzymes: No results for input(s): CKTOTAL, CKMB, CKMBINDEX, TROPONINI in the last 168 hours. BNP: Invalid input(s): POCBNP CBG: Recent Labs  Lab  12/11/17 1632 12/11/17 1948 12/12/17 0043 12/12/17 0358 12/12/17 0807  GLUCAP 151* 179* 225* 163* 153*   D-Dimer No results for input(s): DDIMER in the last 72 hours. Hgb A1c Recent Labs    12/12/17 0305  HGBA1C 7.9*   Lipid Profile Recent Labs    12/12/17 0305  CHOL 170  HDL 33*  LDLCALC 107*  TRIG 148  CHOLHDL 5.2   Thyroid function studies No results for input(s): TSH, T4TOTAL, T3FREE, THYROIDAB in the last 72 hours.  Invalid input(s): FREET3 Anemia work up No results for input(s): VITAMINB12, FOLATE, FERRITIN, TIBC, IRON, RETICCTPCT in the last 72 hours. Urinalysis    Component Value Date/Time   COLORURINE YELLOW 12/10/2017 0649   APPEARANCEUR HAZY (A) 12/10/2017 0649   LABSPEC 1.010 12/10/2017 0649   PHURINE 7.0 12/10/2017 0649   GLUCOSEU NEGATIVE 12/10/2017 0649   HGBUR SMALL (A) 12/10/2017 0649   BILIRUBINUR NEGATIVE 12/10/2017 0649   KETONESUR NEGATIVE 12/10/2017 0649   PROTEINUR NEGATIVE 12/10/2017 0649   UROBILINOGEN 0.2 04/19/2012 0833   NITRITE NEGATIVE 12/10/2017 0649   LEUKOCYTESUR TRACE (A) 12/10/2017 0649     Microbiology Recent Results (from the past 240 hour(s))  MRSA PCR Screening     Status: None   Collection Time: 12/10/17  9:56 AM  Result Value Ref Range Status   MRSA by PCR NEGATIVE NEGATIVE Final    Comment:        The GeneXpert MRSA Assay (FDA approved for NASAL specimens only), is one component of a comprehensive MRSA colonization surveillance program. It is not intended to diagnose MRSA infection nor to guide or monitor treatment for MRSA infections. Performed at [facility], [facility] [address] [place], [place] [address]        Inpatient Medications:   Scheduled Meds: . chlorhexidine  15 mL Mouth Rinse BID  . diltiazem  240 mg Oral q morning - 10a  . furosemide  20 mg Oral Daily  . losartan  50 mg Oral BID  . mouth rinse  15 mL Mouth Rinse q12n4p  . metoprolol tartrate  50 mg Oral BID  . pantoprazole  40  mg Oral Daily  . QUEtiapine  12.5 mg Oral QHS  . senna-docusate  1 tablet Oral BID  . venlafaxine  100 mg Oral Once every 3 days  . venlafaxine  50 mg Oral Once every 3 days  . venlafaxine  50 mg Oral Once every 3 days   Continuous Infusions: . sodium chloride 35 mL/hr at 12/13/17 0700  . clevidipine       Echocardiogram:Study Conclusions  - Left ventricle: The cavity size was normal. There was moderate   concentric and severe asymmetric hypertrophy. Systolic function   was normal. The estimated ejection fraction was in the range of   60% to 65%. Wall motion was normal; there were no regional wall   motion abnormalities. The study is not technically sufficient to   allow evaluation of LV diastolic function. - Mitral valve: Severely calcified annulus. There was mild to   moderate regurgitation. - Left atrium: The atrium was severely dilated. - Right ventricle: Pacer wire or catheter noted in right ventricle. - Right atrium: The atrium was severely dilated. Pacer wire or   catheter noted in right atrium. - Pulmonary arteries: PA peak pressure: 36 mm Hg (S). - Impressions: The right ventricular systolic pressure was   increased consistent with mild pulmonary hypertension.  Impressions:  - The right ventricular systolic pressure was increased consistent   with mild pulmonary hypertension.  CT chest done today:  FINDINGS: Cardiovascular: Enlarged heart due to left atrial and left ventricular enlargement. Atheromatous calcifications, including the coronary arteries and aorta. Small pericardial effusion with a maximum thickness of 8 mm. Left subclavian pacemaker leads in satisfactory position.  Lungs/Pleura: Small bilateral pleural effusions. Mild bilateral dependent atelectasis. Prominent pulmonary vasculature and interstitial markings.  IMPRESSION: 1. Changes of acute congestive heart failure with small bilateral pleural effusions and bilateral dependent  atelectasis. 2.  Calcific coronary artery and aortic atherosclerosis.  Impression/Recommendations Active Problems:   ICH (intracerebral hemorrhage) (HCC)   Intracranial bleed (HCC)   Essential hypertension   Shortness of breath   Supplemental oxygen dependent   Hypokalemia   Leukocytosis   Diabetes mellitus type 2 in nonobese [name].)   Pulmonary hypertension (H[facility])   Sick sinus syndrome (HCC)   Depression   Chronic anticoagulation  83 year old female admitted for hemorrhagic stroke of basal ganglia with new oxygen requirement and abnormal chest x-ray after choking episode yesterday when initiating feeding. She is afebrile and has a leukocytosis which is unchanged since admission 2 days ago. Chest CT today however reveals pulmonary vascular congestion and pleural effusions consistent with acute decompensated congestive heart failure. Echocardiogram done a couple of days ago does show marketed centric hypertrophy,diastolic dysfunction in the left. Also of note patient's granddaughter notes that her grandmother has been progressively short of breath over the past several weeks and initiation of Lasix 20 mg orally by PCP a couple of weeks ago has not been very effective sodas likely that she had started being decompensated several weeks prior to her hospitalization.  Will initiate treatment with Lasix 40 mg IV daily, to be started today. Daily weights and strict ins and outs also requested. I have [place] patients IV fluids of NS at 35 mL an hour in the anticipation the patient will start eating tomorrow. However if she does not eat tomorrow will need to assess fluid status very carefully and potentially start IV fluids back again, even as we are giving her Lasix. She also has hypokalemia which I will supplement and recheck in the morning, I have also started K Dur 40 milliequivalents daily. I suspect patient also has some level of underlying chemical pneumonitis which is also likely  contributing to  her oxygen requirement. [facility] will assume care of this patient tomorrow.    Thank you for this consultation.  Our [facility] hospitalist team will follow the patient with you.   Time Spent: 50 minutes   Vashti Hey M.D. [facility] 12/13/2017, 1:17 PM

## 2017-12-13 NOTE — Consult Note (Signed)
Physical Medicine and Rehabilitation Consult Reason for Consult: Stroke Referring Physician: Wende Bushy, RN   HPI: Traci [name] is a 83 y.o. female with past medical history of sick sinus syndrome, HLD, essential hypertension, depression, CAD, atrial fibrillation on chronic anticoagulation, status post CEA in 1999 presented on 12/10/2017 with right lentiform hemorrhage.  History taken from chart review, nursing, and family.  Patient was spending the night with family and was found to have facial droop.  EMS was called patient brought to outside hospital.  CT performed, reviewed showing right-sided hemorrhage.  Per report right lentiform hemorrhage with surrounding edema.  She is transferred to [facility].  Of note, patient had a fall with left frontal scalp hematoma on 11/24/2017.  Echo was performed which showed ejection fraction of 60 to 65% with pulmonary hypertension.  Hemoglobin A1c noted to be 7.9.  Hospital course further comp located by hypokalemia and leukocytosis and supplemental oxygen dependence.  ROS  Negative per patient, however unreliable Past Medical History:  Diagnosis Date  . Arthritis   . Atrial fibrillation ([place])   . Carotid artery disease ([place])    Right carotid endarectomy 1999  . Chronic anticoagulation    Followed by PMD  . Coronary atherosclerosis    a. Minor at cardiac catheterization 2005 b. cath 10/16/2014 40% prox LAD dx, otherwise minimal CAD  . Depression   . Essential hypertension   . Glucose intolerance (impaired glucose tolerance)   . History of kidney stones   . Hyperlipidemia   . Pneumonia 2010  . Sick sinus syndrome [facility])    [employer] PPM   Past Surgical History:  Procedure Laterality Date  . ABDOMINAL HYSTERECTOMY    . APPENDECTOMY    . BREAST BIOPSY Bilateral    x 7 total  . CARDIAC CATHETERIZATION N/A 10/16/2014   Procedure: Left Heart Cath and Coronary Angiography;  Surgeon: Leonie Man, MD;  Location: [facility];   Service: Cardiovascular;  Laterality: N/A;  . CAROTID ENDARTERECTOMY Right 1999  . CATARACT EXTRACTION W/ INTRAOCULAR LENS  IMPLANT, BILATERAL Bilateral   . COLONOSCOPY     2006  . CYSTOSCOPY W/ URETERAL STENT PLACEMENT Left 04/19/2012   Procedure: CYSTOSCOPY WITH RETROGRADE PYELOGRAM/URETERAL STENT PLACEMENT ;  Surgeon: Ailene Rud, MD;  Location: [facility];  Service: Urology;  Laterality: Left;  . CYSTOSCOPY WITH RETROGRADE PYELOGRAM, URETEROSCOPY AND STENT PLACEMENT Left 06/30/2012   Procedure: CYSTOSCOPY WITH LEFT  RETROGRADE PYELOGRAM, URETEROSCOPY  with basketing of stone, AND STENT PLACEMENT, TRANSURETHRAL UNROOFING OF URETER.;  Surgeon: Alexis Frock, MD;  Location: [facility];  Service: Urology;  Laterality: Left;  . INSERT / REPLACE / REMOVE PACEMAKER  2006  . OPEN REDUCTION INTERNAL FIXATION (ORIF) DISTAL RADIAL FRACTURE Right 04/29/2016   Procedure: OPEN REDUCTION INTERNAL FIXATION (ORIF) DISTAL RADIAL FRACTURE;  Surgeon: Roseanne Kaufman, MD;  Location: [facility];  Service: Orthopedics;  Laterality: Right;  . ORIF DISTAL RADIUS FRACTURE Right 04/29/2016   Family History  Problem Relation Age of Onset  . Heart attack Mother        Died age 36  . Hyperlipidemia Mother   . Diabetes Mother   . Coronary artery disease Father   . Alcohol abuse Father   . Hyperlipidemia Father   . Diabetes Sister   . Hypertension Sister   . Breast cancer Sister   . Breast cancer Sister    Social History:  reports that she has never smoked. She has never used  smokeless tobacco. She reports that she does not drink alcohol or use drugs. Allergies:  Allergies  Allergen Reactions  . Morphine Nausea Only  . Penicillins Other (See Comments)    Has patient had a PCN reaction causing immediate rash, facial/tongue/throat swelling, SOB or lightheadedness with hypotension: NO Has patient had a PCN reaction causing severe rash involving mucus membranes or skin necrosis: no Has patient had a PCN reaction that  required hospitalization: NO Has patient had a PCN reaction occurring within the last 10 years: NO If all of the above answers are "NO", then may proceed with Cephalosporin use.    Medications Prior to Admission  Medication Sig Dispense Refill  . diltiazem (CARDIZEM CD) 240 MG 24 hr capsule Take 240 mg by mouth every morning.     . furosemide (LASIX) 20 MG tablet Take 10 mg by mouth daily.  3  . glipiZIDE (GLUCOTROL XL) 10 MG 24 hr tablet Take 1 tablet by mouth every morning.   1  . losartan (COZAAR) 50 MG tablet Take 1 tablet (50 mg total) by mouth 2 (two) times daily. In the morning 180 tablet 2  . metoprolol tartrate (LOPRESSOR) 50 MG tablet Take 50 mg by mouth 2 (two) times daily.  4  . pravastatin (PRAVACHOL) 20 MG tablet Take 1 tablet by mouth at bedtime.  5  . venlafaxine (EFFEXOR) 100 MG tablet Take 100 mg by mouth daily.   0  . warfarin (COUMADIN) 5 MG tablet Take 2.5-5 mg by mouth daily. Take 2.5 mg on Mon / Tue / Thurs / Sat / Sun Take 5 mg on Wed / Fri      Home: Home Living Family/patient expects to be discharged to:: Private residence Living Arrangements: Alone Available Help at Discharge: Family, Available 24 hours/day Type of Home: House Home Access: Level entry Home Layout: Two level Alternate Level Stairs-Number of Steps: 2 Alternate Level Stairs-Rails: Right Bathroom Shower/Tub: Multimedia programmer: Standard  Functional History: Prior Function Level of Independence: Independent Gait / Transfers Assistance Needed: Pt reports she walks without AD ADL's / Homemaking Assistance Needed: Pt reports she does all of her basic ADLs by herself including all IADLs Comments: Pt reports she normally drives Functional Status:  Mobility: Bed Mobility Overal bed mobility: Needs Assistance Bed Mobility: Supine to Sit Supine to sit: Mod assist, HOB elevated General bed mobility comments: coming out on left hand side of bed; VCs for use of rail, needed A to raise  trunk up and to scoot to EOB Transfers Overall transfer level: Needs assistance Equipment used: 2 person hand held assist Transfers: Sit to/from Stand, Stand Pivot Transfers Sit to Stand: Mod assist, +2 physical assistance Stand pivot transfers: Mod assist, +2 physical assistance General transfer comment: Increased time to sit>stand and VCs need to take steps Ambulation/Gait Ambulation/Gait assistance: Mod assist, +2 physical assistance Gait Distance (Feet): 7 Feet Assistive device: 2 person hand held assist Gait Pattern/deviations: Step-to pattern, Decreased stride length, Shuffle, Decreased weight shift to left, Trunk flexed General Gait Details: wrap around support with gait belt and HHA. Posterior list and limited ability to maintain posture. Max multi modal cues Gait velocity: decreased Gait velocity interpretation: <1.31 ft/sec, indicative of household ambulator    ADL: ADL Overall ADL's : Needs assistance/impaired Eating/Feeding: Moderate assistance Eating/Feeding Details (indicate cue type and reason): supported sitting Grooming: Moderate assistance Grooming Details (indicate cue type and reason): supported sitting Upper Body Bathing: Moderate assistance Upper Body Bathing Details (indicate cue type and reason):  supported sitting Lower Body Bathing: Maximal assistance Lower Body Bathing Details (indicate cue type and reason): Mod A +2 sit<>stand Upper Body Dressing : Maximal assistance Upper Body Dressing Details (indicate cue type and reason): supported sitting Lower Body Dressing: Maximal assistance Lower Body Dressing Details (indicate cue type and reason): Mod A +2 sit<>stand Toilet Transfer: Moderate assistance, +2 for safety/equipment, Stand-pivot, BSC Toileting- Clothing Manipulation and Hygiene: Total assistance Toileting - Clothing Manipulation Details (indicate cue type and reason): Mod A +2 sit<>stand  Cognition: Cognition Overall Cognitive Status:  Impaired/Different from baseline Arousal/Alertness: Awake/alert Orientation Level: Oriented to person, Oriented to place, Disoriented to time, Disoriented to situation Attention: Sustained Sustained Attention: Impaired Sustained Attention Impairment: Verbal basic Memory: Impaired Memory Impairment: Decreased recall of new information, Decreased short term memory Decreased Short Term Memory: Verbal basic, Functional basic Awareness: Impaired Awareness Impairment: Intellectual impairment Problem Solving: Impaired Problem Solving Impairment: Verbal basic, Functional basic Safety/Judgment: Impaired Cognition Arousal/Alertness: Awake/alert Behavior During Therapy: Flat affect Overall Cognitive Status: Impaired/Different from baseline Area of Impairment: Orientation, Attention, Memory, Following commands, Safety/judgement, Awareness, Problem solving Orientation Level: Disoriented to, Time Current Attention Level: Sustained Memory: Decreased short-term memory Following Commands: Follows one step commands consistently, Follows one step commands with increased time Safety/Judgement: Decreased awareness of safety, Decreased awareness of deficits Awareness: Intellectual Problem Solving: Slow processing, Decreased initiation, Difficulty sequencing, Requires verbal cues, Requires tactile cues General Comments: Multimodel cues for some activities (ie: handed washcloth but need multiple cues to use it)  Blood pressure (!) 150/83, pulse 78, temperature 98.1 F (36.7 C), temperature source Oral, resp. rate 18, height 5\' 5"  (1.651 m), weight 74.8 kg, SpO2 93 %. Physical Exam  Constitutional: She appears well-developed and well-nourished.  HENT:  Head: Normocephalic.  Traumatic  Eyes: EOM are normal. Right eye exhibits no discharge. Left eye exhibits no discharge.  Neck: Normal range of motion. Neck supple.  Cardiovascular:  Cardiac controlled  Respiratory: Effort normal and breath sounds normal.   + [place]  GI: Soft. Bowel sounds are normal.  Musculoskeletal:  No edema or tenderness in extremities  Neurological:  Motor: Right upper extremity/right lower extremity: 5/5 proximal distal Left upper extremity/left lower extremity: 4+/5 proximal distal with apraxia Left facial weakness Alert and oriented x2  Skin:  Facial ecchymosis  Psychiatric: Her speech is delayed. She is hyperactive. Cognition and memory are impaired.    Results for orders placed or performed during the hospital encounter of 12/10/17 (from the past 24 hour(s))  Protime-INR     Status: None   Collection Time: 12/13/17  3:44 AM  Result Value Ref Range   Prothrombin Time 14.6 11.4 - 15.2 seconds   INR 1.15   CBC     Status: Abnormal   Collection Time: 12/13/17  3:44 AM  Result Value Ref Range   WBC 12.3 (H) 4.0 - 10.5 K/uL   RBC 3.75 (L) 3.87 - 5.11 MIL/uL   Hemoglobin 11.7 (L) 12.0 - 15.0 g/dL   HCT 36.4 36.0 - 46.0 %   MCV 97.1 80.0 - 100.0 fL   MCH 31.2 26.0 - 34.0 pg   MCHC 32.1 30.0 - 36.0 g/dL   RDW 13.8 11.5 - 15.5 %   Platelets 230 150 - 400 K/uL   nRBC 0.0 0.0 - 0.2 %  Basic metabolic panel     Status: Abnormal   Collection Time: 12/13/17  3:44 AM  Result Value Ref Range   Sodium 141 135 - 145 mmol/L   Potassium 3.3 (L) 3.5 -  5.1 mmol/L   Chloride 108 98 - 111 mmol/L   CO2 25 22 - 32 mmol/L   Glucose, Bld 131 (H) 70 - 99 mg/dL   BUN 12 8 - 23 mg/dL   Creatinine, Ser 0.83 0.44 - 1.00 mg/dL   Calcium 9.0 8.9 - 10.3 mg/dL   GFR calc non Af Amer >60 >60 mL/min   GFR calc Af Amer >60 >60 mL/min   Anion gap 8 5 - 15   Ct Angio Head W Or Wo Contrast  Result Date: 12/11/2017 CLINICAL DATA:  Follow-up examination for known intracranial hemorrhage. Patient with right lentiform intraparenchymal hematoma. EXAM: CT ANGIOGRAPHY HEAD AND NECK TECHNIQUE: Multidetector CT imaging of the head and neck was performed using the standard protocol during bolus administration of intravenous contrast. Multiplanar  CT image reconstructions and MIPs were obtained to evaluate the vascular anatomy. Carotid stenosis measurements (when applicable) are obtained utilizing NASCET criteria, using the distal internal carotid diameter as the denominator. CONTRAST:  59mL ISOVUE-370 IOPAMIDOL (ISOVUE-370) INJECTION 76% COMPARISON:  Prior CT from 12/10/2017 as well as earlier studies. FINDINGS: CTA NECK FINDINGS Aortic arch: Visualized aortic arch of normal caliber with normal branch pattern. Moderate atherosclerotic change about the aortic arch and origin of the great vessels without hemodynamically significant stenosis. Visualized subclavian arteries widely patent. Right carotid system: Scattered non stenotic plaque within the right common carotid artery without significant narrowing. Concentric calcified plaque about the right bifurcation/proximal right ICA without hemodynamically significant stenosis. Probable remote changes related to prior endarterectomy noted. Right ICA patent from the bifurcation to the skull base without stenosis, dissection, or occlusion. Left carotid system: Left common carotid artery patent from its origin to the bifurcation without significant stenosis. Left common carotid artery medialized into the retropharyngeal space. Scattered a centric calcified plaque about the left bifurcation/proximal left ICA with associated stenosis of up to approximately 50% by NASCET criteria. Left ICA widely patent distally to the skull base without stenosis, dissection, or occlusion. Vertebral arteries: Both of the vertebral arteries arise from the subclavian arteries. The left vertebral artery appears to be slightly dominant. Left vertebral artery essentially occludes just beyond its origin, and remains occluded to approximately the level of C4-5. Distal reconstitution likely via muscular branches. Irregular attenuated flow seen distally within the left vertebral artery which is otherwise patent to the skull base. The right  vertebral artery is occluded at its origin. Irregular distal reconstitution at approximately the level of C5-6 (series 8, image 231). Irregular thready and attenuated flow seen distally throughout the right ICA with multifocal moderate to severe segmental stenoses. Right vertebral artery is nearly occluded as it reaches the cranial vault (series 8, image 165). Skeleton: No acute osseus abnormality. No discrete lytic or blastic osseous lesions. Moderate cervical spondylolysis noted at C4-5 through C6-7. Other neck: No acute soft tissue abnormality within the neck. Chronic left maxillary sinusitis noted. Salivary glands within normal limits. No adenopathy. Thyroid within normal limits. Upper chest: Streak artifact from left-sided pacemaker/AICD. Left-sided pleural effusion with associated atelectasis partially visualized. Additional scattered atelectatic changes noted within the visualized lungs. Review of the MIP images confirms the above findings CTA HEAD FINDINGS Anterior circulation: Petrous segments widely patent bilaterally. Advanced atheromatous plaque within the cavernous/supraclinoid ICAs with moderate multifocal narrowing. ICA termini widely patent. Left A1 segment irregular but patent without high-grade stenosis. Severe diffuse stenosis of the right A1 segment. Normal anterior communicating artery. Extensive atheromatous irregularity throughout the ACAs, left greater than right were there are multifocal moderate to  severe stenoses. Left M1 irregular but widely patent without high-grade stenosis. Focal moderate proximal right M1 stenosis noted (series 8, image 92). Normal MCA bifurcations. No proximal M2 occlusion. Extensive small vessel atheromatous irregularity throughout the MCA branches bilaterally which are well perfused and fairly symmetric. Posterior circulation: Multifocal atheromatous irregularity within the dominant left V4 segment which is patent to the vertebrobasilar junction without high-grade  stenosis. Patent left PICA. Diminutive right vertebral artery nearly occluded at the skull base, with scant irregular flow seen within the right V4 segment. Right V4 occludes prior to the vertebrobasilar junction. Right PICA of not seen. Basilar artery somewhat diminutive. Severe atheromatous change throughout the mid and distal basilar artery with multifocal severe stenoses. Anterior inferior cerebral arteries patent bilaterally. Superior cerebral arteries grossly patent. Predominant fetal type origin of the PCAs with patent and robust posterior communicating arteries. PCAs are patent to their distal aspects with multifocal moderate to severe distal P3 and P4 stenoses. Venous sinuses: Not well assessed due to arterial timing of the contrast bolus. Anatomic variants: Fetal type origin of the PCAs with underlying diminutive vertebrobasilar system. Focal outpouching at the cavernous left ICA in the region of the left hypophyseal artery favored to reflect a normal vascular infundibulum. No AVM or other vascular abnormality seen underlying the right lentiform hemorrhage. Delayed phase: No abnormal enhancement. Right lentiform nucleus hemorrhage relatively stable measuring 13 x 14 mm. No intraventricular extension. Review of the MIP images confirms the above findings IMPRESSION: 1. No significant interval change in size and appearance right lenticular hematoma. No underlying vascular abnormality identified. 2. Severe vertebrobasilar atherosclerotic disease as above. Proximal-mid vertebral arteries are occluded within the neck with distal reconstitution. Right vertebral subsequently occludes at the skull base. Multifocal severe stenoses seen throughout the basilar artery. Overall, vertebrobasilar system is diminutive with fetal type origin of the PCAs. 3. Approximate 50% atheromatous stenosis at the left carotid bifurcation. 4. Sequelae of remote right carotid endarterectomy, with no significant residual or recurrent  stenosis. 5. Additional extensive atherosclerotic change throughout the intracranial circulation as above. No large vessel occlusion. Electronically Signed   By: Jeannine Boga M.D.   On: 12/11/2017 17:12   Ct Angio Neck W Or Wo Contrast  Result Date: 12/11/2017 CLINICAL DATA:  Follow-up examination for known intracranial hemorrhage. Patient with right lentiform intraparenchymal hematoma. EXAM: CT ANGIOGRAPHY HEAD AND NECK TECHNIQUE: Multidetector CT imaging of the head and neck was performed using the standard protocol during bolus administration of intravenous contrast. Multiplanar CT image reconstructions and MIPs were obtained to evaluate the vascular anatomy. Carotid stenosis measurements (when applicable) are obtained utilizing NASCET criteria, using the distal internal carotid diameter as the denominator. CONTRAST:  27mL ISOVUE-370 IOPAMIDOL (ISOVUE-370) INJECTION 76% COMPARISON:  Prior CT from 12/10/2017 as well as earlier studies. FINDINGS: CTA NECK FINDINGS Aortic arch: Visualized aortic arch of normal caliber with normal branch pattern. Moderate atherosclerotic change about the aortic arch and origin of the great vessels without hemodynamically significant stenosis. Visualized subclavian arteries widely patent. Right carotid system: Scattered non stenotic plaque within the right common carotid artery without significant narrowing. Concentric calcified plaque about the right bifurcation/proximal right ICA without hemodynamically significant stenosis. Probable remote changes related to prior endarterectomy noted. Right ICA patent from the bifurcation to the skull base without stenosis, dissection, or occlusion. Left carotid system: Left common carotid artery patent from its origin to the bifurcation without significant stenosis. Left common carotid artery medialized into the retropharyngeal space. Scattered a centric calcified plaque about the left  bifurcation/proximal left ICA with associated  stenosis of up to approximately 50% by NASCET criteria. Left ICA widely patent distally to the skull base without stenosis, dissection, or occlusion. Vertebral arteries: Both of the vertebral arteries arise from the subclavian arteries. The left vertebral artery appears to be slightly dominant. Left vertebral artery essentially occludes just beyond its origin, and remains occluded to approximately the level of C4-5. Distal reconstitution likely via muscular branches. Irregular attenuated flow seen distally within the left vertebral artery which is otherwise patent to the skull base. The right vertebral artery is occluded at its origin. Irregular distal reconstitution at approximately the level of C5-6 (series 8, image 231). Irregular thready and attenuated flow seen distally throughout the right ICA with multifocal moderate to severe segmental stenoses. Right vertebral artery is nearly occluded as it reaches the cranial vault (series 8, image 165). Skeleton: No acute osseus abnormality. No discrete lytic or blastic osseous lesions. Moderate cervical spondylolysis noted at C4-5 through C6-7. Other neck: No acute soft tissue abnormality within the neck. Chronic left maxillary sinusitis noted. Salivary glands within normal limits. No adenopathy. Thyroid within normal limits. Upper chest: Streak artifact from left-sided pacemaker/AICD. Left-sided pleural effusion with associated atelectasis partially visualized. Additional scattered atelectatic changes noted within the visualized lungs. Review of the MIP images confirms the above findings CTA HEAD FINDINGS Anterior circulation: Petrous segments widely patent bilaterally. Advanced atheromatous plaque within the cavernous/supraclinoid ICAs with moderate multifocal narrowing. ICA termini widely patent. Left A1 segment irregular but patent without high-grade stenosis. Severe diffuse stenosis of the right A1 segment. Normal anterior communicating artery. Extensive  atheromatous irregularity throughout the ACAs, left greater than right were there are multifocal moderate to severe stenoses. Left M1 irregular but widely patent without high-grade stenosis. Focal moderate proximal right M1 stenosis noted (series 8, image 92). Normal MCA bifurcations. No proximal M2 occlusion. Extensive small vessel atheromatous irregularity throughout the MCA branches bilaterally which are well perfused and fairly symmetric. Posterior circulation: Multifocal atheromatous irregularity within the dominant left V4 segment which is patent to the vertebrobasilar junction without high-grade stenosis. Patent left PICA. Diminutive right vertebral artery nearly occluded at the skull base, with scant irregular flow seen within the right V4 segment. Right V4 occludes prior to the vertebrobasilar junction. Right PICA of not seen. Basilar artery somewhat diminutive. Severe atheromatous change throughout the mid and distal basilar artery with multifocal severe stenoses. Anterior inferior cerebral arteries patent bilaterally. Superior cerebral arteries grossly patent. Predominant fetal type origin of the PCAs with patent and robust posterior communicating arteries. PCAs are patent to their distal aspects with multifocal moderate to severe distal P3 and P4 stenoses. Venous sinuses: Not well assessed due to arterial timing of the contrast bolus. Anatomic variants: Fetal type origin of the PCAs with underlying diminutive vertebrobasilar system. Focal outpouching at the cavernous left ICA in the region of the left hypophyseal artery favored to reflect a normal vascular infundibulum. No AVM or other vascular abnormality seen underlying the right lentiform hemorrhage. Delayed phase: No abnormal enhancement. Right lentiform nucleus hemorrhage relatively stable measuring 13 x 14 mm. No intraventricular extension. Review of the MIP images confirms the above findings IMPRESSION: 1. No significant interval change in size  and appearance right lenticular hematoma. No underlying vascular abnormality identified. 2. Severe vertebrobasilar atherosclerotic disease as above. Proximal-mid vertebral arteries are occluded within the neck with distal reconstitution. Right vertebral subsequently occludes at the skull base. Multifocal severe stenoses seen throughout the basilar artery. Overall, vertebrobasilar system is diminutive with  fetal type origin of the PCAs. 3. Approximate 50% atheromatous stenosis at the left carotid bifurcation. 4. Sequelae of remote right carotid endarterectomy, with no significant residual or recurrent stenosis. 5. Additional extensive atherosclerotic change throughout the intracranial circulation as above. No large vessel occlusion. Electronically Signed   By: Jeannine Boga M.D.   On: 12/11/2017 17:12   Dg Chest Port 1 View  Result Date: 12/13/2017 CLINICAL DATA:  Shortness of breath EXAM: PORTABLE CHEST 1 VIEW COMPARISON:  12/11/2017 FINDINGS: Left chest wall pacemaker is unchanged. There is moderate cardiomegaly with calcific aortic atherosclerosis. Consolidation at the left lung base with small pleural effusion has slightly worsened from the prior study. There is worsened aeration of the right lower lobe. IMPRESSION: 1. Worsening left basilar consolidation and small left pleural effusion. 2. Worsening aeration of the right lower lobe. Electronically Signed   By: Ulyses Jarred M.D.   On: 12/13/2017 03:26   Dg Swallowing Func-speech Pathology  Result Date: 12/11/2017 Objective Swallowing Evaluation: Type of Study: MBS-Modified Barium Swallow Study  Patient Details Name: ALEATHA TAITE MRN: [id number] Date of Birth: [birth year] Today's Date: 12/11/2017 Time: SLP Start Time (ACUTE ONLY): 6433 -SLP Stop Time (ACUTE ONLY): 2951 SLP Time Calculation (min) (ACUTE ONLY): 25 min Past Medical History: Past Medical History: Diagnosis Date . Arthritis  . Atrial fibrillation ([place])  . Carotid artery disease ([place])    Right carotid endarectomy 1999 . Chronic anticoagulation   Followed by PMD . Coronary atherosclerosis   a. Minor at cardiac catheterization 2005 b. cath 10/16/2014 40% prox LAD dx, otherwise minimal CAD . Depression  . Essential hypertension  . Glucose intolerance (impaired glucose tolerance)  . History of kidney stones  . Hyperlipidemia  . Pneumonia 2010 . Sick sinus syndrome ([facility])   [employer] PPM Past Surgical History: Past Surgical History: Procedure Laterality Date . ABDOMINAL HYSTERECTOMY   . APPENDECTOMY   . BREAST BIOPSY Bilateral   x 7 total . CARDIAC CATHETERIZATION N/A 10/16/2014  Procedure: Left Heart Cath and Coronary Angiography;  Surgeon: Leonie Man, MD;  Location: [facility];  Service: Cardiovascular;  Laterality: N/A; . CAROTID ENDARTERECTOMY Right 1999 . CATARACT EXTRACTION W/ INTRAOCULAR LENS  IMPLANT, BILATERAL Bilateral  . COLONOSCOPY    2006 . CYSTOSCOPY W/ URETERAL STENT PLACEMENT Left 04/19/2012  Procedure: CYSTOSCOPY WITH RETROGRADE PYELOGRAM/URETERAL STENT PLACEMENT ;  Surgeon: Ailene Rud, MD;  Location: [facility];  Service: Urology;  Laterality: Left; . CYSTOSCOPY WITH RETROGRADE PYELOGRAM, URETEROSCOPY AND STENT PLACEMENT Left 06/30/2012  Procedure: CYSTOSCOPY WITH LEFT  RETROGRADE PYELOGRAM, URETEROSCOPY  with basketing of stone, AND STENT PLACEMENT, TRANSURETHRAL UNROOFING OF URETER.;  Surgeon: Alexis Frock, MD;  Location: [facility];  Service: Urology;  Laterality: Left; . INSERT / REPLACE / REMOVE PACEMAKER  2006 . OPEN REDUCTION INTERNAL FIXATION (ORIF) DISTAL RADIAL FRACTURE Right 04/29/2016  Procedure: OPEN REDUCTION INTERNAL FIXATION (ORIF) DISTAL RADIAL FRACTURE;  Surgeon: Roseanne Kaufman, MD;  Location: [facility];  Service: Orthopedics;  Laterality: Right; . ORIF DISTAL RADIUS FRACTURE Right 04/29/2016 HPI: Pt is an 83 y.o. female admitted with L facial droop. CT showed a R lentiform hemorrhage with mild surrounding edema. PMH includes: dementia (living in ILF), sick sinus  syndrome, hyperlipidemia, essential hypertension, CAD, chronic anticoagulation, atrial fibrillation, carotid artery disease, PNA  Subjective: pt alert, pleasant, but confused Assessment / Plan / Recommendation CHL IP CLINICAL IMPRESSIONS 12/11/2017 Clinical Impression Pt has a moderate oral and mild pharyngeal dysphagia with sensorimotor deficits. Orally she has weak labial seal  and lingual manipulation, allowing premature spillage with thin liquids, saliva, and even a large piece of unmasticated cracker without pt awareness. She has mild-moderate L buccal pocketing, but can attend to this residue when given Min-Mod cues for lingual sweep. Her timing is mildly impaired for swallow trigger with thin liquids, likely at least in part related to decreased oral containement and/or decreased sensation, although no aspiration occurs. She consistently penetrates, with penetrates getting close to the true vocal folds, but penetrates clear the laryngeal vestibule upon completion of the swallow. Nectar thick liquids were also tested with no penetration observed. Recommend to start wtih Dys 1 diet and thin liquids with full supervision. Should pt experience any overt difficulty during meals, nectar thick liquids could be considered as a safer alternative. SLP will continue to follow for tolerance and readiness to advance. SLP Visit Diagnosis Dysphagia, oropharyngeal phase (R13.12) Attention and concentration deficit following -- Frontal lobe and executive function deficit following -- Impact on safety and function Mild aspiration risk;Moderate aspiration risk   CHL IP TREATMENT RECOMMENDATION 12/11/2017 Treatment Recommendations Therapy as outlined in treatment plan below   Prognosis 12/11/2017 Prognosis for Safe Diet Advancement Good Barriers to Reach Goals Cognitive deficits Barriers/Prognosis Comment -- CHL IP DIET RECOMMENDATION 12/11/2017 SLP Diet Recommendations Dysphagia 1 (Puree) solids;Thin liquid Liquid Administration  via Cup;Straw Medication Administration Crushed with puree Compensations Slow rate;Small sips/bites;Minimize environmental distractions;Lingual sweep for clearance of pocketing;Monitor for anterior loss Postural Changes Seated upright at 90 degrees   CHL IP OTHER RECOMMENDATIONS 12/11/2017 Recommended Consults -- Oral Care Recommendations Oral care BID Other Recommendations Have oral suction available   CHL IP FOLLOW UP RECOMMENDATIONS 12/11/2017 Follow up Recommendations (No Data)   CHL IP FREQUENCY AND DURATION 12/11/2017 Speech Therapy Frequency (ACUTE ONLY) min 2x/week Treatment Duration 2 weeks      CHL IP ORAL PHASE 12/11/2017 Oral Phase Impaired Oral - Pudding Teaspoon -- Oral - Pudding Cup -- Oral - Honey Teaspoon -- Oral - Honey Cup -- Oral - Nectar Teaspoon -- Oral - Nectar Cup -- Oral - Nectar Straw Weak lingual manipulation;Reduced posterior propulsion Oral - Thin Teaspoon -- Oral - Thin Cup Weak lingual manipulation;Reduced posterior propulsion;Left anterior bolus loss Oral - Thin Straw Weak lingual manipulation;Reduced posterior propulsion Oral - Puree Weak lingual manipulation;Reduced posterior propulsion;Left pocketing in lateral sulci Oral - Mech Soft Weak lingual manipulation;Reduced posterior propulsion;Left pocketing in lateral sulci;Left anterior bolus loss Oral - Regular -- Oral - Multi-Consistency -- Oral - Pill -- Oral Phase - Comment --  CHL IP PHARYNGEAL PHASE 12/11/2017 Pharyngeal Phase Impaired Pharyngeal- Pudding Teaspoon -- Pharyngeal -- Pharyngeal- Pudding Cup -- Pharyngeal -- Pharyngeal- Honey Teaspoon -- Pharyngeal -- Pharyngeal- Honey Cup -- Pharyngeal -- Pharyngeal- Nectar Teaspoon -- Pharyngeal -- Pharyngeal- Nectar Cup -- Pharyngeal -- Pharyngeal- Nectar Straw WFL Pharyngeal -- Pharyngeal- Thin Teaspoon -- Pharyngeal -- Pharyngeal- Thin Cup Penetration/Aspiration before swallow Pharyngeal Material enters airway, remains ABOVE vocal cords and not ejected out Pharyngeal- Thin Straw  Penetration/Aspiration before swallow Pharyngeal Material enters airway, remains ABOVE vocal cords and not ejected out Pharyngeal- Puree WFL Pharyngeal -- Pharyngeal- Mechanical Soft WFL Pharyngeal -- Pharyngeal- Regular -- Pharyngeal -- Pharyngeal- Multi-consistency -- Pharyngeal -- Pharyngeal- Pill -- Pharyngeal -- Pharyngeal Comment --  CHL IP CERVICAL ESOPHAGEAL PHASE 12/11/2017 Cervical Esophageal Phase WFL Pudding Teaspoon -- Pudding Cup -- Honey Teaspoon -- Honey Cup -- Nectar Teaspoon -- Nectar Cup -- Nectar Straw -- Thin Teaspoon -- Thin Cup -- Thin Straw -- Puree -- Mechanical Soft -- Regular --  Multi-consistency -- Pill -- Cervical Esophageal Comment -- Germain Osgood 12/11/2017, 5:30 PM  Germain Osgood, M.A. [facility] Pager [phone] Office [phone]              Assessment/Plan: Diagnosis: Right lentiform hemorrhage Labs and images (see above) independently reviewed.  Records reviewed and summated above. Stroke: Continue secondary stroke prophylaxis and Risk Factor Modification listed below:   Blood Pressure Management:  Continue current medication with prn's with permisive HTN per primary team Diabetes management:   Left sided hemiparesis: fit for orthosis to prevent contractures (resting hand splint for day, wrist cock up splint at night, PRAFO, etc)  1. Does the need for close, 24 hr/day medical supervision in concert with the patient's rehab needs make it unreasonable for this patient to be served in a less intensive setting? Yes  2. Co-Morbidities requiring supervision/potential complications: supplemental oxygen dependence (wean as tolerated), hypokalemia (continue to monitor and replete as necessary), leukocytosis (repeat labs, cont to monitor for signs and symptoms of infection, further workup if indicated), DM (Monitor in accordance with exercise and adjust meds as necessary), pulmonary hypertension (Monitor in accordance with increased  physical activity and avoid UE resistance excercises), sick sinus syndrome, HLD, depression (ensure mood does not hinder progress of therapies), CAD, atrial fibrillation on chronic anticoagulation, status post CEA in 1999 3. Due to safety, disease management, medication administration and patient education, does the patient require 24 hr/day rehab nursing? Yes 4. Does the patient require coordinated care of a physician, rehab nurse, PT (1-2 hrs/day, 5 days/week), OT (1-2 hrs/day, 5 days/week) and SLP (1-2 hrs/day, 5 days/week) to address physical and functional deficits in the context of the above medical diagnosis(es)? Yes Addressing deficits in the following areas: balance, endurance, locomotion, strength, transferring, bathing, dressing, toileting, cognition and psychosocial support 5. Can the patient actively participate in an intensive therapy program of at least 3 hrs of therapy per day at least 5 days per week? Potentially 6. The potential for patient to make measurable gains while on inpatient rehab is excellent 7. Anticipated functional outcomes upon discharge from inpatient rehab are supervision  with PT, supervision with OT, supervision and min assist with SLP. 8. Estimated rehab length of stay to reach the above functional goals is: 12-16 days. 9. Anticipated D/C setting: Other 10. Anticipated post D/C treatments: HH therapy and Home excercise program 11. Overall Rehab/Functional Prognosis: good  RECOMMENDATIONS: This patient's condition is appropriate for continued rehabilitative care in the following setting: CIR if caregiver support available upon discharge and when patient able to tolerate 3 hours of therapy per day. Patient has agreed to participate in recommended program. Potentially Note that insurance prior authorization may be required for reimbursement for recommended care.  Comment: Rehab Admissions Coordinator to follow up.   Delice Lesch, MD, ABPMR 12/13/2017

## 2017-12-13 NOTE — Progress Notes (Signed)
Patient has been coughing with increasing SOB. Lungs sound wheezy on auscultation. Paged MD. Orders received for CXR and one time Duoneb. After duoneb, patient with improvement in wheezing. Will await CXR results.   Milford Cage, RN

## 2017-12-14 ENCOUNTER — Inpatient Hospital Stay (HOSPITAL_COMMUNITY): Payer: PPO

## 2017-12-14 LAB — BASIC METABOLIC PANEL
ANION GAP: 6 (ref 5–15)
BUN: 13 mg/dL (ref 8–23)
CALCIUM: 8.7 mg/dL — AB (ref 8.9–10.3)
CO2: 26 mmol/L (ref 22–32)
CREATININE: 0.91 mg/dL (ref 0.44–1.00)
Chloride: 108 mmol/L (ref 98–111)
GFR calc Af Amer: >60 mL/min (ref >60)
GFR calc non Af Amer: 58 mL/min — ABNORMAL LOW (ref >60)
GLUCOSE: 136 mg/dL — AB (ref 70–99)
Potassium: 4.2 mmol/L (ref 3.5–5.1)
Sodium: 140 mmol/L (ref 135–145)

## 2017-12-14 LAB — CBC WITH DIFFERENTIAL/PLATELET
Abs Immature Granulocytes: 0.02 10*3/uL (ref 0.00–0.07)
Basophils Absolute: 0.1 10*3/uL (ref 0.0–0.1)
Basophils Relative: 1 %
EOS ABS: 0.2 10*3/uL (ref 0.0–0.5)
Eosinophils Relative: 2 %
HEMATOCRIT: 36.5 % (ref 36.0–46.0)
Hemoglobin: 11.7 g/dL — ABNORMAL LOW (ref 12.0–15.0)
IMMATURE GRANULOCYTES: 0 %
LYMPHS ABS: 2 10*3/uL (ref 0.7–4.0)
Lymphocytes Relative: 19 %
MCH: 31.9 pg (ref 26.0–34.0)
MCHC: 32.1 g/dL (ref 30.0–36.0)
MCV: 99.5 fL (ref 80.0–100.0)
MONOS PCT: 11 %
Monocytes Absolute: 1.2 10*3/uL — ABNORMAL HIGH (ref 0.1–1.0)
NEUTROS PCT: 67 %
Neutro Abs: 7.4 10*3/uL (ref 1.7–7.7)
Platelets: 179 10*3/uL (ref 150–400)
RBC: 3.67 MIL/uL — ABNORMAL LOW (ref 3.87–5.11)
RDW: 13.9 % (ref 11.5–15.5)
WBC: 10.9 10*3/uL — ABNORMAL HIGH (ref 4.0–10.5)
nRBC: 0 % (ref 0.0–0.2)

## 2017-12-14 LAB — GLUCOSE, CAPILLARY
GLUCOSE-CAPILLARY: 159 mg/dL — AB (ref 70–99)
Glucose-Capillary: 125 mg/dL — ABNORMAL HIGH (ref 70–99)
Glucose-Capillary: 169 mg/dL — ABNORMAL HIGH (ref 70–99)
Glucose-Capillary: 187 mg/dL — ABNORMAL HIGH (ref 70–99)
Glucose-Capillary: 196 mg/dL — ABNORMAL HIGH (ref 70–99)

## 2017-12-14 MED ORDER — STARCH (THICKENING) PO POWD: ORAL | Status: DC | PRN

## 2017-12-14 MED ORDER — VENLAFAXINE HCL 50 MG PO TABS
100.0000 mg | ORAL_TABLET | Freq: Every day | ORAL | Status: DC
  Administered 2017-12-15 – 2017-12-16 (×2): 100 mg via ORAL
  Filled 2017-12-14 (×2): qty 2

## 2017-12-14 MED ORDER — FUROSEMIDE 10 MG/ML IJ SOLN
40.0000 mg | Freq: Once | INTRAMUSCULAR | Status: AC
  Administered 2017-12-14: 40 mg via INTRAVENOUS
  Filled 2017-12-14: qty 4

## 2017-12-14 MED ORDER — SENNOSIDES-DOCUSATE SODIUM 8.6-50 MG PO TABS
2.0000 | ORAL_TABLET | Freq: Two times a day (BID) | ORAL | Status: DC
  Administered 2017-12-14 – 2017-12-16 (×5): 2 via ORAL
  Filled 2017-12-14 (×5): qty 2

## 2017-12-14 MED ORDER — POLYETHYLENE GLYCOL 3350 17 G PO PACK
17.0000 g | PACK | Freq: Every day | ORAL | Status: DC
  Administered 2017-12-14 – 2017-12-16 (×3): 17 g via ORAL
  Filled 2017-12-14 (×3): qty 1

## 2017-12-14 MED ORDER — FUROSEMIDE 40 MG PO TABS
40.0000 mg | ORAL_TABLET | Freq: Every day | ORAL | Status: DC
  Administered 2017-12-15 – 2017-12-16 (×2): 40 mg via ORAL
  Filled 2017-12-14 (×2): qty 1

## 2017-12-14 MED ORDER — RESOURCE THICKENUP CLEAR PO POWD
ORAL | Status: DC | PRN
  Filled 2017-12-14: qty 125

## 2017-12-14 NOTE — Care Management Note (Signed)
Case Management Note  Patient Details  Name: Traci [name] MRN: [id number] Date of Birth: Nov 13, 1934  Subjective/Objective:    Pt admitted with stroke. She is from home alone.              Action/Plan: Recommendations are for CIR. CM following for d/c disposition.  Expected Discharge Date:                  Expected Discharge Plan:  [place]  In-House Referral:     Discharge planning Services  CM Consult  Post Acute Care Choice:    Choice offered to:     DME Arranged:    DME Agency:     HH Arranged:    [place] Agency:     Status of Service:  In process, will continue to follow  If discussed at Long Length of Stay Meetings, dates discussed:    Additional Comments:  Pollie Friar, RN 12/14/2017, 12:06 PM

## 2017-12-14 NOTE — Progress Notes (Addendum)
Physical Therapy Treatment Patient Details Name: Traci [name] MRN: [id number] DOB: [birth year] Today's Date: 12/14/2017    History of Present Illness Pt is an 83 y.o. female admitted with L facial droop. CT showed a R lentiform hemorrhage with mild surrounding edema. PMH includes: dementia (living in ILF), sick sinus syndrome, hyperlipidemia, essential hypertension, CAD, chronic anticoagulation, atrial fibrillation, carotid artery disease, PNA     PT Comments    Pt progressed with gait down the hallway with RW and mod assist for all mobility.  Chair followed as she fatigued quickly.  She has significant sequencing and memory issues (not remembering that she washed her hair this AM).    Follow Up Recommendations  CIR     Equipment Recommendations  Rolling walker with 5" wheels    Recommendations for Other Services   NA     Precautions / Restrictions Precautions Precautions: Fall    Mobility  Bed Mobility               General bed mobility comments: Pt was OOB in the recliner chair.   Transfers Overall transfer level: Needs assistance Equipment used: Rolling walker (2 wheeled) Transfers: Sit to/from Stand Sit to Stand: Mod assist         General transfer comment: Mod assist to stand from recliner chair, significant time spent trying to get her to push up from the chair and not pull on the RW.  Manual facilitation to keep hands on chair to push up to stand.    Ambulation/Gait Ambulation/Gait assistance: Mod assist;+2 safety/equipment Gait Distance (Feet): 55 Feet Assistive device: Rolling walker (2 wheeled) Gait Pattern/deviations: Step-through pattern;Decreased step length - left;Decreased dorsiflexion - left Gait velocity: decreased Gait velocity interpretation: <1.8 ft/sec, indicate of risk for recurrent falls General Gait Details: Pt not as briskly progressing left leg forward as right, near constant cues for upright posture and closer proximity to RW.  Chair  to follow to encourage increased gait distance, pt fatigued very quickly.        Modified Rankin (Stroke Patients Only) Modified Rankin (Stroke Patients Only) Pre-Morbid Rankin Score: No symptoms Modified Rankin: Moderately severe disability     Balance Overall balance assessment: Needs assistance Sitting-balance support: Feet supported;Bilateral upper extremity supported Sitting balance-Leahy Scale: Poor Sitting balance - Comments: relies on UE to keep trunk off of the back of the chair. Postural control: Posterior lean Standing balance support: Bilateral upper extremity supported Standing balance-Leahy Scale: Poor Standing balance comment: relies on RW for support and external support from therapist.                             Cognition Arousal/Alertness: Awake/alert Behavior During Therapy: WFL for tasks assessed/performed Overall Cognitive Status: Impaired/Different from baseline Area of Impairment: Attention;Memory;Following commands;Safety/judgement;Awareness;Problem solving                   Current Attention Level: Sustained Memory: Decreased short-term memory Following Commands: Follows one step commands consistently Safety/Judgement: Decreased awareness of safety;Decreased awareness of deficits   Problem Solving: Difficulty sequencing;Requires verbal cues;Requires tactile cues General Comments: Pt with difficulty with more complex commands, need verbal and physical assist for sequencing.  Pt did not remember that she washed her hair earlier this AM, family/friends in room and she was easily distracted.               Pertinent Vitals/Pain Pain Assessment: No/denies pain        PT  Goals (current goals can now be found in the care plan section) Acute Rehab PT Goals Patient Stated Goal: to get better Progress towards PT goals: Progressing toward goals    Frequency    Min 4X/week      PT Plan Current plan remains appropriate     Co-evaluation              AM-PAC PT "6 Clicks" Daily Activity  Outcome Measure  Difficulty turning over in bed (including adjusting bedclothes, sheets and blankets)?: Unable Difficulty moving from lying on back to sitting on the side of the bed? : Unable Difficulty sitting down on and standing up from a chair with arms (e.g., wheelchair, bedside commode, etc,.)?: Unable Help needed moving to and from a bed to chair (including a wheelchair)?: A Lot Help needed walking in hospital room?: A Lot Help needed climbing 3-5 steps with a railing? : A Lot 6 Click Score: 9    End of Session Equipment Utilized During Treatment: Gait belt Activity Tolerance: Patient limited by fatigue Patient left: in chair;with call bell/phone within reach;with family/visitor present   PT Visit Diagnosis: Muscle weakness (generalized) (M62.81);Other symptoms and signs involving the nervous system (D40.814)     Time: 4818-5631 PT Time Calculation (min) (ACUTE ONLY): 16 min  Charges:  $Gait Training: 8-22 mins                    Sharlon Pfohl B. Davius Goudeau, PT, DPT  Acute Rehabilitation [phone] pager #[phone] office   12/14/2017, 1:49 PM

## 2017-12-14 NOTE — Progress Notes (Signed)
[facility] Progress Note  Patient: Traci [name] WGY:[id number]   PCP: Celene Squibb, MD DOB: 1934/03/11   DOA: 12/10/2017   DOS: 12/14/2017   Date of Service: the patient was seen and examined on 12/14/2017  Brief hospital course: Traci [name] is an 83 y.o. female past medical history significant for atrial fibrillation, coronary artery disease, peripheral vascular disease, hypertension, diabetes type 2 and mild dementia who was admittedon November 14 for management of right lentiform hemorrhage thought to be secondary to hypertension. Of note patient was on Coumadin. Patient was admitted with blood pressure control and reversal of Coumadin and has done well over the past 3 days Currently further plan is monitor diuresis.  Subjective: Feeling better.  No nausea no vomiting no fever no chills.  Assessment and Plan: 1.  Intracranial hemorrhage. Secondary to hypertension on Coumadin.  With resultant left hemiparesis and left facial droop. Patient was initially admitted with the neurology in the ICU. PT OT was consulted CIR recommended. Patient did receive Kcentra for warfarin reversal. Currently neurology recommends to hold on Coumadin and consider DO AC for anticoagulation going forward. Recommend to hold antic regulation for at least 1 month with outpatient follow-up with CT scan and neurology and decide on anticoagulation at that point.  2.  Essential hypertension. Continue blood pressure medications.  3.  Acute on chronic diastolic CHF. Patient was given IV Lasix.  Appears euvolemic for now. We will transition to oral Lasix. Monitor.  4.  Paroxysmal A. fib. Sick sinus syndrome S/P pacemaker implant. On Coumadin currently on hold.  5. obesity   Body mass index is 30.45 kg/m.   6.  Type 2 diabetes mellitus. Hemoglobin A1c 7.9. Uncontrolled with hyperglycemia. Continue sliding scale insulin. No complication  Diet: carb modified DVT Prophylaxis: mechanical  compression device  Advance goals of care discussion: DNR DNI  Family Communication: no family was present at bedside, at the time of interview.   Disposition:  Discharge to CIR.  Consultants: Neurology Procedures: Echocardiogram   Scheduled Meds: . chlorhexidine  15 mL Mouth Rinse BID  . diltiazem  240 mg Oral q morning - 10a  . [START ON 12/15/2017] furosemide  40 mg Oral Daily  . losartan  50 mg Oral BID  . mouth rinse  15 mL Mouth Rinse q12n4p  . metoprolol tartrate  50 mg Oral BID  . pantoprazole  40 mg Oral Daily  . polyethylene glycol  17 g Oral Daily  . potassium chloride  40 mEq Oral Daily  . QUEtiapine  12.5 mg Oral QHS  . senna-docusate  2 tablet Oral BID  . [START ON 12/15/2017] venlafaxine  100 mg Oral Daily   Continuous Infusions: PRN Meds: acetaminophen **OR** acetaminophen (TYLENOL) oral liquid 160 mg/5 mL **OR** acetaminophen, butalbital-acetaminophen-caffeine, labetalol, Melatonin, RESOURCE THICKENUP CLEAR Antibiotics: Anti-infectives (From admission, onward)   None       Objective: Physical Exam: Vitals:   12/14/17 0800 12/14/17 0852 12/14/17 1229 12/14/17 1552  BP: (!) 151/91 (!) 192/108 (!) 181/92 (!) 144/72  Pulse: 67 75 79 85  Resp: 17 18 18 18   Temp:  98.2 F (36.8 C) 97.6 F (36.4 C) 98.2 F (36.8 C)  TempSrc:  Oral Oral Oral  SpO2: 97% 96% 94% 100%  Weight:      Height:        Intake/Output Summary (Last 24 hours) at 12/14/2017 1826 Last data filed at 12/14/2017 1200 Gross per 24 hour  Intake 120 ml  Output 1450 ml  Net -1330 ml   Filed Weights   12/10/17 0703 12/14/17 0500  Weight: 74.8 kg 83 kg   General: Alert, Awake and Oriented to Time, Place and Person. Appear in mild distress, affect appropriate Eyes: PERRL, Conjunctiva normal ENT: Oral Mucosa clear moist. Neck: no JVD, no Abnormal Mass Or lumps Cardiovascular: S1 and S2 Present, aortic systolic  Murmur, Peripheral Pulses Present Respiratory: normal respiratory  effort, Bilateral Air entry equal and Decreased, no use of accessory muscle, Clear to Auscultation, n Crackles, no wheezes Abdomen: Bowel Sound present, Soft and no tenderness, no hernia Skin: no redness, no Rash, no induration Extremities: no Pedal edema, no calf tenderness Neurologic: Grossly no focal neuro deficit.  Left-sided mild hemiparesis  Data Reviewed: CBC: Recent Labs  Lab 12/10/17 0701 12/12/17 0305 12/13/17 0344 12/14/17 0454  WBC 10.5 13.2* 12.3* 10.9*  NEUTROABS 6.4  --   --  7.4  HGB 14.9 12.4 11.7* 11.7*  HCT 45.1 39.1 36.4 36.5  MCV 95.6 97.8 97.1 99.5  PLT 280 260 230 203   Basic Metabolic Panel: Recent Labs  Lab 12/10/17 0701 12/10/17 2336 12/12/17 0305 12/13/17 0344 12/14/17 0454  NA 138 139 139 141 140  K 3.4* 3.6 3.6 3.3* 4.2  CL 102 105 107 108 108  CO2 28 27 24 25 26   GLUCOSE 169* 170* 184* 131* 136*  BUN 14 16 16 12 13   CREATININE 0.81 0.94 0.90 0.83 0.91  CALCIUM 9.1 8.7* 9.0 9.0 8.7*  MG  --  1.8  --   --   --     Liver Function Tests: Recent Labs  Lab 12/10/17 0701  AST 21  ALT 17  ALKPHOS 86  BILITOT 0.7  PROT 7.5  ALBUMIN 3.6   No results for input(s): LIPASE, AMYLASE in the last 168 hours. No results for input(s): AMMONIA in the last 168 hours. Coagulation Profile: Recent Labs  Lab 12/10/17 1556 12/10/17 2248 12/11/17 0349 12/12/17 0305 12/13/17 0344  INR 1.34 1.31 1.27 1.24 1.15   Cardiac Enzymes: No results for input(s): CKTOTAL, CKMB, CKMBINDEX, TROPONINI in the last 168 hours. BNP (last 3 results) No results for input(s): PROBNP in the last 8760 hours. CBG: Recent Labs  Lab 12/12/17 0358 12/12/17 0807 12/14/17 0831 12/14/17 1117 12/14/17 1630  GLUCAP 163* 153* 125* 169* 159*   Studies: Dg Chest Port 1 View  Result Date: 12/14/2017 CLINICAL DATA:  Pleural effusions and atelectasis EXAM: PORTABLE CHEST 1 VIEW COMPARISON:  12/13/2017 FINDINGS: Stable cardiomegaly. Improvement in the perihilar and  basilar edema pattern with residual pleural effusions and compressive basilar atelectasis, worse on the left. Upper lobes remain clear. No pneumothorax. Trachea is midline. Aorta is atherosclerotic. Left subclavian pacer noted. IMPRESSION: Improving CHF pattern with persistent cardiomegaly. Residual bilateral pleural effusions and basilar atelectasis/consolidation worse on the left. Electronically Signed   By: Jerilynn Mages.  Shick M.D.   On: 12/14/2017 08:15     Time spent: 35 minutes  Author: Berle Mull, MD [facility] Pager: [phone] 12/14/2017 6:26 PM  Between 7PM-7AM, please contact night-coverage at [email or web address], password [facility]

## 2017-12-14 NOTE — Progress Notes (Signed)
STROKE TEAM PROGRESS NOTE   SUBJECTIVE (INTERVAL HISTORY) Her daughter was at the bedside. Pt without new complaints. Hopes to d/c to CIR once medical workup completed. Medical hospitalist taking over care today. Pt and daughter aware and agreeable.    OBJECTIVE Vitals:   12/14/17 0600 12/14/17 0700 12/14/17 0736 12/14/17 0741  BP: (!) 152/91 (!) 151/81    Pulse: 60 61  66  Resp: 16 15  14   Temp:   98.3 F (36.8 C)   TempSrc:   Oral   SpO2: 97% 98%  97%  Weight:      Height:        CBC:  Recent Labs  Lab 12/10/17 0701  12/13/17 0344 12/14/17 0454  WBC 10.5   < > 12.3* 10.9*  NEUTROABS 6.4  --   --  7.4  HGB 14.9   < > 11.7* 11.7*  HCT 45.1   < > 36.4 36.5  MCV 95.6   < > 97.1 99.5  PLT 280   < > 230 179   < > = values in this interval not displayed.    Basic Metabolic Panel:  Recent Labs  Lab 12/10/17 2336  12/13/17 0344 12/14/17 0454  NA 139   < > 141 140  K 3.6   < > 3.3* 4.2  CL 105   < > 108 108  CO2 27   < > 25 26  GLUCOSE 170*   < > 131* 136*  BUN 16   < > 12 13  CREATININE 0.94   < > 0.83 0.91  CALCIUM 8.7*   < > 9.0 8.7*  MG 1.8  --   --   --    < > = values in this interval not displayed.    Lipid Panel:     Component Value Date/Time   CHOL 170 12/12/2017 0305   TRIG 148 12/12/2017 0305   TRIG 176 04/30/2006   HDL 33 (L) 12/12/2017 0305   CHOLHDL 5.2 12/12/2017 0305   VLDL 30 12/12/2017 0305   LDLCALC 107 (H) 12/12/2017 0305   LDLCALC 100 04/30/2006   HgbA1c:  Lab Results  Component Value Date   HGBA1C 7.9 (H) 12/12/2017   Urine Drug Screen:     Component Value Date/Time   LABOPIA NONE DETECTED 12/10/2017 0649   COCAINSCRNUR NONE DETECTED 12/10/2017 0649   LABBENZ NONE DETECTED 12/10/2017 0649   AMPHETMU NONE DETECTED 12/10/2017 0649   THCU NONE DETECTED 12/10/2017 0649   LABBARB NONE DETECTED 12/10/2017 0649    Alcohol Level     Component Value Date/Time   ETH <10 12/10/2017 0701    IMAGING  Ct Head Wo  Contrast 12/10/2017 Right lenticular hematoma unchanged from earlier today. No intraventricular hemorrhage or midline shift.   Dg Chest Port 1 View 12/14/2017 Improving CHF pattern with persistent cardiomegaly. Residual bilateral pleural effusions and basilar  12/13/2017 1. Worsening left basilar consolidation and small left pleural effusion. 2. Worsening aeration of the right lower lobe. atelectasis/consolidation worse on the left.  12/11/2017 Consolidation left lower lobe with small left pleural effusion. Right lung clear. There is stable cardiomegaly with aortic atherosclerosis. Pacemaker leads attached to right atrium and right ventricle. Bones osteoporotic. Aortic Atherosclerosis (ICD10-I70.0).\ 12/10/2017 Cardiac pacer with lead tips over the right atrium right ventricle. Cardiomegaly with diffuse bilateral from interstitial prominence and small left pleural effusion consistent with CHF.   Ct Head Code Stroke Wo Contrast 12/10/2017 1. Small acute right lentiform hemorrhage with estimated blood  volume of 4 mL. Mild surrounding edema. No ventricular or extra-axial extension.  2. Underlying chronic small vessel disease. Partially resolved left scalp hematoma since October. Small chronic right tentorial meningioma.  Ct Angio Head W Or Wo Contrast Ct Angio Neck W Or Wo Contrast 12/11/2017 1. No significant interval change in size and appearance right lenticular hematoma. No underlying vascular abnormality identified. 2. Severe vertebrobasilar atherosclerotic disease as above. Proximal-mid vertebral arteries are occluded within the neck with distal reconstitution. Right vertebral subsequently occludes at the skull base. Multifocal severe stenoses seen throughout the basilar artery. Overall, vertebrobasilar system is diminutive with fetal type origin of the PCAs. 3. Approximate 50% atheromatous stenosis at the left carotid bifurcation. 4. Sequelae of remote right carotid endarterectomy, with no  significant residual or recurrent stenosis. 5. Additional extensive atherosclerotic change throughout the intracranial circulation as above. No large vessel occlusion.   Ct Chest Wo Contrast 12/13/2017 1. Changes of acute congestive heart failure with small bilateral pleural effusions and bilateral dependent atelectasis. 2.  Calcific coronary artery and aortic atherosclerosis. Aortic Atherosclerosis (ICD10-I70.0).    PHYSICAL EXAM General - Well nourished, well developed, not agitated. Cardiovascular - irregularly irregular heart rate and rhythm..  Mental Status -  Level of arousal and orientation to place, age and person were intact, but not orientated to time. Language including expression, naming, repetition, comprehension was assessed and found intact. Mild dysarthria  Cranial Nerves II - XII - II - Visual field intact OU. III, IV, VI - Extraocular movements intact. V - Facial sensation intact bilaterally. VII - left facial droop. VIII - Hearing & vestibular intact bilaterally. X - Palate elevates symmetrically. Mild dysarthria XI - Chin turning & shoulder shrug intact bilaterally. XII - Tongue protrusion intact.  Motor Strength - The patient's strength was normal in all extremities except mild LUE pronator drift and decreased finger dexterity.  Bulk was normal and fasciculations were absent.   Motor Tone - Muscle tone was assessed at the neck and appendages and was normal.  Reflexes - The patient's reflexes were symmetrical in all extremities and she had no pathological reflexes.  Sensory - Light touch, temperature/pinprick were assessed and were symmetrical.    Coordination - The patient had normal movements in the hands but slow on the left.  Tremor was absent.  Gait and Station - deferred.   ASSESSMENT/PLAN [name] is a 83 y.o. female with history of sick sinus syndrome s/p PPM, hyperlipidemia, essential hypertension, CAD, chronic anticoagulation - coumadin -  INR 2.42 on admission, atrial fibrillation, carotid artery disease presenting with facial droop and slurry speech.  She did not receive IV t-PA due to [place]  ICH - Right lentiform hemorrhage likely due to HTN on coumadin  Resultant  Mild left arm hemiparesis and left facial droop  CT head - Right lenticular small hematoma   MRI head - PPM  MRA head - PPM  CT repeat stable hematoma  CTA H&N  Posterior circulation hypoplastic, b/l fetal PCAs, diffuse athero, right s/p CEA, left ICA 50% stenosis  2D Echo -EF 60 to 65%, mild pulmonary hypertension  LDL - 107  HgbA1c 7.9  UDS - negative  VTE prophylaxis - SCDs  warfarin daily prior to admission, reversed with Kcentra, now on No antithrombotic  Ongoing aggressive stroke risk factor management  Therapy recommendations:  CIR  Disposition:  Pending  Chronic Afib on coumadin  On Coumadin at home  Patient has refused DOACs in the past as per granddaughter  INR 2.42 on admission  Reversed with Kcentra  INR 1.27-1.24  Hold off anticoagulation for now due to [place]  Discussed with patient and daughter to consider DOACs for anticoagulation with Eliquis once ICH resolves. Will need imaging at 1 month prior to decision. Can address at time of neuro follow up.   CHF with small pleural effusion  2D echo EF 60 to 65%  Chest x-ray suggestted CHF  On home Lasix  Hypertension  Stable  Treated with cleviprex in ICU  SBP goal less than 160 . Long-term BP goal normotensive . On home meds including Cardizem, losartan, metoprolol  Hyperlipidemia  Lipid lowering medication PTA:  Pravachol  LDL 107, goal < 70  Resume pravastatin on time of discharge  Agitation and sundowning  on low-dose Seroquel at night  On effexor home dose  Other Stroke Risk Factors  Advanced age  Coronary artery disease  Carotid artery disease status post right CEA and left ICA 50% stenosis  Other Active Problems  COPD  SSS status post  pacemaker   Stroke workup completed. Stroke team will sign off - medical hospitalist taking over care today.   Hospital day # Colbert, MSN, APRN, ANVP-BC, AGPCNP-BC Advanced Practice Stroke Nurse [facility] for Schedule & Pager information 12/14/2017 3:40 PM   To contact Stroke Continuity provider, please refer to [email or web address] After hours, contact General Neurology

## 2017-12-14 NOTE — Progress Notes (Signed)
IP rehab admissions - I met with patient, her daughter and her two sisters.  They would like inpatient rehab.  I will begin insurance pre-authorization and follow up tomorrow for plans.  Call me for questions.  [phone]

## 2017-12-14 NOTE — Progress Notes (Signed)
Called daughter to update on move to 3W06, report has been called, will transfer out soon. Modena Morrow E, RN 12/14/2017 8:26 AM

## 2017-12-14 NOTE — Progress Notes (Signed)
  Speech Language Pathology Treatment: Dysphagia;Cognitive-Linquistic  Patient Details Name: Traci [name] MRN: [id number] DOB: May 08, 1934 Today's Date: 12/14/2017 Time: 1031-5945 SLP Time Calculation (min) (ACUTE ONLY): 14 min  Assessment / Plan / Recommendation Clinical Impression  Pt is alert this afternoon but continues to show intermittent coughing with thin liquids that is concerning for decreased airway protection. Coughing is eliminated with nectar thick liquids and purees, although she does need Min-Mod cues for management of left-sided anterior loss and buccal pocketing. She needs Max cues for recall of information and repeats herself in conversation, but follows commands very well. Min cues were provided for word-finding errors. Recommend CIR.   HPI HPI: Pt is an 83 y.o. female admitted with L facial droop. CT showed a R lentiform hemorrhage with mild surrounding edema. PMH includes: dementia (living in ILF), sick sinus syndrome, hyperlipidemia, essential hypertension, CAD, chronic anticoagulation, atrial fibrillation, carotid artery disease, PNA      SLP Plan  Continue with current plan of care       Recommendations  Diet recommendations: Dysphagia 1 (puree);Nectar-thick liquid Liquids provided via: Cup;Straw Medication Administration: Crushed with puree Supervision: Staff to assist with self feeding;Full supervision/cueing for compensatory strategies Compensations: Slow rate;Small sips/bites Postural Changes and/or Swallow Maneuvers: Seated upright 90 degrees                Oral Care Recommendations: Oral care BID Follow up Recommendations: Inpatient Rehab SLP Visit Diagnosis: Dysphagia, oropharyngeal phase (R13.12);Cognitive communication deficit (O59.292) Plan: Continue with current plan of care       GO                Germain Osgood 12/14/2017, 5:36 PM  Germain Osgood, M.A. [place] [facility] Environmental education officer [phone] Office  [phone]

## 2017-12-15 DIAGNOSIS — R9389 Abnormal findings on diagnostic imaging of other specified body structures: Secondary | ICD-10-CM

## 2017-12-15 LAB — BASIC METABOLIC PANEL
Anion gap: 8 (ref 5–15)
BUN: 14 mg/dL (ref 8–23)
CALCIUM: 9.1 mg/dL (ref 8.9–10.3)
CHLORIDE: 105 mmol/L (ref 98–111)
CO2: 27 mmol/L (ref 22–32)
CREATININE: 0.98 mg/dL (ref 0.44–1.00)
GFR, EST NON AFRICAN AMERICAN: 53 mL/min — AB (ref >60)
Glucose, Bld: 164 mg/dL — ABNORMAL HIGH (ref 70–99)
Potassium: 3 mmol/L — ABNORMAL LOW (ref 3.5–5.1)
Sodium: 140 mmol/L (ref 135–145)

## 2017-12-15 LAB — CBC
HCT: 37.1 % (ref 36.0–46.0)
HEMOGLOBIN: 12.2 g/dL (ref 12.0–15.0)
MCH: 31.9 pg (ref 26.0–34.0)
MCHC: 32.9 g/dL (ref 30.0–36.0)
MCV: 96.9 fL (ref 80.0–100.0)
PLATELETS: 234 10*3/uL (ref 150–400)
RBC: 3.83 MIL/uL — AB (ref 3.87–5.11)
RDW: 14 % (ref 11.5–15.5)
WBC: 11.1 10*3/uL — AB (ref 4.0–10.5)
nRBC: 0 % (ref 0.0–0.2)

## 2017-12-15 LAB — GLUCOSE, CAPILLARY
GLUCOSE-CAPILLARY: 154 mg/dL — AB (ref 70–99)
GLUCOSE-CAPILLARY: 151 mg/dL — AB (ref 70–99)
GLUCOSE-CAPILLARY: 146 mg/dL — AB (ref 70–99)
GLUCOSE-CAPILLARY: 233 mg/dL — AB (ref 70–99)
Glucose-Capillary: 238 mg/dL — ABNORMAL HIGH (ref 70–99)

## 2017-12-15 LAB — MAGNESIUM: Magnesium: 1.8 mg/dL (ref 1.7–2.4)

## 2017-12-15 MED ORDER — POTASSIUM CHLORIDE CRYS ER 20 MEQ PO TBCR
40.0000 meq | EXTENDED_RELEASE_TABLET | Freq: Once | ORAL | Status: AC
  Administered 2017-12-15: 40 meq via ORAL
  Filled 2017-12-15: qty 2

## 2017-12-15 NOTE — Progress Notes (Deleted)
CSW spoke with patient about possible discharge options. Patient reported that he is living in a shack. He only has access to water. He does have another female living with him. He reported that he cannot read and that he would need help coordinating services. CSW spoke with patient about being discharged today. Patient said that he hopes that it is not today. He asked for a bus pass. He asked if he could go to rehab for his drinking issue. CSW asked if he wanted to go to inpatient or outpatient. He stated that he would like to go to inpatient. CSW said that she would call around to some facilities to see if any beds were available.  

## 2017-12-15 NOTE — Progress Notes (Signed)
PROGRESS NOTE    Traci [name]  BSJ:[id number] DOB: [birth year] DOA: 12/10/2017 PCP: Celene Squibb, MD   Brief Narrative:  83 year old with history of A. fib, coronary artery disease, PVD, hypertension, diabetes, dementia presented to the hospital November 14 for right lentiform hemorrhage thought to be secondary to hypertension.  Patient was on Coumadin at that time.  He was admitted by the neurology service was given reversal of Coumadin.  TRH resumed care of patient on 12/14/2017 as patient developed mild CHF exacerbation which is improved.  Currently inpatient rehab is consulted however patient's daughter feels that she may need SNF.  Social work consulted. Assessment & Plan   Intracranial hemorrhage -Right lentiform hemorrhage likely due to hypertension on Coumadin (patient given Kcentra on admission) -Noted with left arm hemiparesis and left facial droop -CT head showed right lenticular a small hematoma -Unable to perform MRI or MRA due to pacemaker -Repeat CT showed stable hematoma -CTA head and neck showed posterior circulation hypoplastic, bilateral fetal PCAs, diffuse atherosclerosis, right status post CEA, left ICA 50% stenosis -Echocardiogram showed an EF of 6065%, mild pulmonary hypertension -LDL 107, hemoglobin A1c 7.9 -PT, OT recommended CIR -Neurology was primary however has now signed off and will follow up with patient to discuss anticoagulation in 1 month  Chronic atrial fibrillation -Patient was on Coumadin at home and presented with INR of 2.42 on admission -Reversed with Kcentra -Per neurology, DOAC was discussed with patient's family, will possibly start Eliquis when ICH resolves.  Patient will need repeat imaging in 1 month prior to decision. -Continue diltiazem  Acute on chronic diastolic CHF -Echocardiogram as above -Chest x-ray on 12/14/2017 shows improvement in CHF -Was given IV Lasix however appears to be euvolemic now -Transitioned to oral Lasix, currently  40 mg daily -Monitor intake and output, daily weights  Essential hypertension -BP currently stable, continue, diltiazem, Lasix  Hyperlipidemia -Continue statin on discharge  Diabetes mellitus, type II -Hemoglobin A1c 7.9 -Continue insulin sliding scale with CBG monitoring  Agitation and sundowning -Patient with history of dementia, currently stable -Continue Seroquel, Effexor  Goals of care -Patient currently DNR.  Discussed discharge planning with patient's daughter, she feels that she would not be able to provide 24-hour supervision at home if patient was to be discharged to inpatient rehab.  Feels more comfortable with patient being discharged to SNF.  Social work consulted.  DVT Prophylaxis  SCDs  Code Status: DNR  Family Communication: None at bedside. Daughter via phone.  Disposition Plan: Admitted. Dispo TBD (likely SNF)  Consultants Neurology (was primary, signed off) Inpatient rehab  Procedures  Echocardiogram  Antibiotics   Anti-infectives (From admission, onward)   None      Subjective:   Traci [name] seen and examined today.  Patient with dementia.  Has no complaints this morning.  Denies chest pain or shortness of breath.  Objective:   Vitals:   12/15/17 0556 12/15/17 0641 12/15/17 0749 12/15/17 1130  BP: (!) 176/87 (!) 154/112 (!) 161/87 110/61  Pulse:   86 75  Resp: 13  17 17   Temp:   (!) 97.5 F (36.4 C) 98.4 F (36.9 C)  TempSrc:   Oral Oral  SpO2:   91% 93%  Weight:      Height:        Intake/Output Summary (Last 24 hours) at 12/15/2017 1247 Last data filed at 12/14/2017 1900 Gross per 24 hour  Intake -  Output 400 ml  Net -400 ml   [employer]  12/10/17 0703 12/14/17 0500  Weight: 74.8 kg 83 kg    Exam  General: Well developed, elderly, NAD  HEENT: NCAT, mucous membranes moist.   Neck: Supple  Cardiovascular: S1 S2 auscultated, irregular, +SEM  Respiratory: Clear to auscultation bilaterally with equal chest  rise  Abdomen: Soft, nontender, nondistended, + bowel sounds  Extremities: warm dry without cyanosis clubbing or edema  Neuro: AAOx1 (self), dementia, left sided weakness  Psych: Pleasant    Data Reviewed: I have personally reviewed following labs and imaging studies  CBC: Recent Labs  Lab 12/10/17 0701 12/12/17 0305 12/13/17 0344 12/14/17 0454 12/15/17 0520  WBC 10.5 13.2* 12.3* 10.9* 11.1*  NEUTROABS 6.4  --   --  7.4  --   HGB 14.9 12.4 11.7* 11.7* 12.2  HCT 45.1 39.1 36.4 36.5 37.1  MCV 95.6 97.8 97.1 99.5 96.9  PLT 280 260 230 179 889   Basic Metabolic Panel: Recent Labs  Lab 12/10/17 2336 12/12/17 0305 12/13/17 0344 12/14/17 0454 12/15/17 0520  NA 139 139 141 140 140  K 3.6 3.6 3.3* 4.2 3.0*  CL 105 107 108 108 105  CO2 27 24 25 26 27   GLUCOSE 170* 184* 131* 136* 164*  BUN 16 16 12 13 14   CREATININE 0.94 0.90 0.83 0.91 0.98  CALCIUM 8.7* 9.0 9.0 8.7* 9.1  MG 1.8  --   --   --  1.8   GFR: Estimated Creatinine Clearance: 47.9 mL/min (by C-G formula based on SCr of 0.98 mg/dL). Liver Function Tests: Recent Labs  Lab 12/10/17 0701  AST 21  ALT 17  ALKPHOS 86  BILITOT 0.7  PROT 7.5  ALBUMIN 3.6   No results for input(s): LIPASE, AMYLASE in the last 168 hours. No results for input(s): AMMONIA in the last 168 hours. Coagulation Profile: Recent Labs  Lab 12/10/17 1556 12/10/17 2248 12/11/17 0349 12/12/17 0305 12/13/17 0344  INR 1.34 1.31 1.27 1.24 1.15   Cardiac Enzymes: No results for input(s): CKTOTAL, CKMB, CKMBINDEX, TROPONINI in the last 168 hours. BNP (last 3 results) No results for input(s): PROBNP in the last 8760 hours. HbA1C: No results for input(s): HGBA1C in the last 72 hours. CBG: Recent Labs  Lab 12/14/17 2226 12/14/17 2345 12/15/17 0404 12/15/17 0747 12/15/17 1105  GLUCAP 187* 196* 154* 151* 146*   Lipid Profile: No results for input(s): CHOL, HDL, LDLCALC, TRIG, CHOLHDL, LDLDIRECT in the last 72 hours. Thyroid  Function Tests: No results for input(s): TSH, T4TOTAL, FREET4, T3FREE, THYROIDAB in the last 72 hours. Anemia Panel: No results for input(s): VITAMINB12, FOLATE, FERRITIN, TIBC, IRON, RETICCTPCT in the last 72 hours. Urine analysis:    Component Value Date/Time   COLORURINE YELLOW 12/10/2017 0649   APPEARANCEUR HAZY (A) 12/10/2017 0649   LABSPEC 1.010 12/10/2017 0649   PHURINE 7.0 12/10/2017 0649   GLUCOSEU NEGATIVE 12/10/2017 0649   HGBUR SMALL (A) 12/10/2017 0649   BILIRUBINUR NEGATIVE 12/10/2017 0649   KETONESUR NEGATIVE 12/10/2017 0649   PROTEINUR NEGATIVE 12/10/2017 0649   UROBILINOGEN 0.2 04/19/2012 0833   NITRITE NEGATIVE 12/10/2017 0649   LEUKOCYTESUR TRACE (A) 12/10/2017 0649   Sepsis Labs: @LABRCNTIP (procalcitonin:4,lacticidven:4)  ) Recent Results (from the past 240 hour(s))  MRSA PCR Screening     Status: None   Collection Time: 12/10/17  9:56 AM  Result Value Ref Range Status   MRSA by PCR NEGATIVE NEGATIVE Final    Comment:        The GeneXpert MRSA Assay (FDA approved for NASAL specimens only),  is one component of a comprehensive MRSA colonization surveillance program. It is not intended to diagnose MRSA infection nor to guide or monitor treatment for MRSA infections. Performed at [facility], [place] [address] [place], [place] [address]       Radiology Studies: Ct Chest Wo Contrast  Result Date: 12/13/2017 CLINICAL DATA:  Rales on auscultation. EXAM: CT CHEST WITHOUT CONTRAST TECHNIQUE: Multidetector CT imaging of the chest was performed following the standard protocol without IV contrast. COMPARISON:  08/19/2011. Portable chest obtained earlier today. FINDINGS: Cardiovascular: Enlarged heart due to left atrial and left ventricular enlargement. Atheromatous calcifications, including the coronary arteries and aorta. Small pericardial effusion with a maximum thickness of 8 mm. Left subclavian pacemaker leads in satisfactory position.  Mediastinum/Nodes: No enlarged mediastinal or axillary lymph nodes. Thyroid gland, trachea, and esophagus demonstrate no significant findings. Lungs/Pleura: Small bilateral pleural effusions. Mild bilateral dependent atelectasis. Prominent pulmonary vasculature and interstitial markings. Upper Abdomen: Dense retained barium in the colon with associated streak artifacts. Musculoskeletal: Old, healed right rib fractures. Thoracic and lower cervical spine degenerative changes. IMPRESSION: 1. Changes of acute congestive heart failure with small bilateral pleural effusions and bilateral dependent atelectasis. 2.  Calcific coronary artery and aortic atherosclerosis. Aortic Atherosclerosis (ICD10-I70.0). Electronically Signed   By: Claudie Revering M.D.   On: 12/13/2017 17:02   Dg Chest Port 1 View  Result Date: 12/14/2017 CLINICAL DATA:  Pleural effusions and atelectasis EXAM: PORTABLE CHEST 1 VIEW COMPARISON:  12/13/2017 FINDINGS: Stable cardiomegaly. Improvement in the perihilar and basilar edema pattern with residual pleural effusions and compressive basilar atelectasis, worse on the left. Upper lobes remain clear. No pneumothorax. Trachea is midline. Aorta is atherosclerotic. Left subclavian pacer noted. IMPRESSION: Improving CHF pattern with persistent cardiomegaly. Residual bilateral pleural effusions and basilar atelectasis/consolidation worse on the left. Electronically Signed   By: Jerilynn Mages.  Shick M.D.   On: 12/14/2017 08:15     Scheduled Meds: . chlorhexidine  15 mL Mouth Rinse BID  . diltiazem  240 mg Oral q morning - 10a  . furosemide  40 mg Oral Daily  . losartan  50 mg Oral BID  . mouth rinse  15 mL Mouth Rinse q12n4p  . metoprolol tartrate  50 mg Oral BID  . pantoprazole  40 mg Oral Daily  . polyethylene glycol  17 g Oral Daily  . potassium chloride  40 mEq Oral Daily  . QUEtiapine  12.5 mg Oral QHS  . senna-docusate  2 tablet Oral BID  . venlafaxine  100 mg Oral Daily   Continuous  Infusions:   LOS: 5 days   Time Spent in minutes   30 minutes  Azell Bill D.O. on 12/15/2017 at 12:47 PM  Between 7am to 7pm - Please see pager noted on [email or web address]  After 7pm go to [email or web address]  And look for the night coverage person covering for me after hours  [facility] Office  [phone]

## 2017-12-15 NOTE — Progress Notes (Signed)
IP rehab admissions - I have received a denial from insurance carrier for acute inpatient rehab admission.  Patient's daughter is now interested in SNF at the [facility]  Case manager/Social worker are aware and are pursuing SNF.  Call me for questions.  [phone]

## 2017-12-15 NOTE — Care Management Important Message (Signed)
Important Message  Patient Details  Name: Traci [name] MRN: [id number] Date of Birth: 06-20-1934   Medicare Important Message Given:  Yes    Zephyr Ridley [address] 12/15/2017, 1:51 PM

## 2017-12-15 NOTE — Progress Notes (Signed)
Occupational Therapy Treatment Patient Details Name: Traci [name] MRN: [id number] DOB: [birth year] Today's Date: 12/15/2017    History of present illness Pt is an 83 y.o. female admitted with L facial droop. CT showed a R lentiform hemorrhage with mild surrounding edema. PMH includes: dementia (living in ILF), sick sinus syndrome, hyperlipidemia, essential hypertension, CAD, chronic anticoagulation, atrial fibrillation, carotid artery disease, PNA    OT comments  Patient progressing slowly.  Mod assist +2 for transfers and mobility using rolling walker, cueing for sequencing and coordination as patient presents with poor initiation and motor planning, as well as inattention to L side.  Completed grooming standing at sink with limited tolerance (fatigued from ambulation), with increased effort/cueing to incorporate L UE into hand washing activity.  Continues to present with L lateral lean in sitting and standing, requiring multimodal cueing for erect and midline posture.  Continue to recommend CIR at discharge.  Will continue to follow.    Follow Up Recommendations  CIR;Supervision/Assistance - 24 hour    Equipment Recommendations  Other (comment)(TBD at next venue of care)    Recommendations for Other Services Rehab consult    Precautions / Restrictions Precautions Precautions: Fall Restrictions Weight Bearing Restrictions: No       Mobility Bed Mobility Overal bed mobility: Needs Assistance Bed Mobility: Supine to Sit     Supine to sit: Mod assist;HOB elevated     General bed mobility comments: mod assist for management of LB and trunk, forward scoot given increased time and multimodal cueing for sequencing and coordination  Transfers Overall transfer level: Needs assistance Equipment used: Rolling walker (2 wheeled) Transfers: Sit to/from Stand Sit to Stand: Mod assist;+2 safety/equipment         General transfer comment: mod assist +2 for sit to stand from EOB,  cueing for hand placement and safety; increased time and effort with decreased initation and sequencing     Balance Overall balance assessment: Needs assistance Sitting-balance support: Feet supported;No upper extremity supported Sitting balance-Leahy Scale: Poor Sitting balance - Comments: left lateral and anterior lean at EOB Postural control: Left lateral lean Standing balance support: Bilateral upper extremity supported Standing balance-Leahy Scale: Poor Standing balance comment: relies on RW for support and external support from therapist.                            ADL either performed or assessed with clinical judgement   ADL Overall ADL's : Needs assistance/impaired     Grooming: Standing;Moderate assistance Grooming Details (indicate cue type and reason): +2 for safety standing at sink during hand washing tasks, limited by fatigue                 Toilet Transfer: Moderate assistance;+2 for safety/equipment;Ambulation;RW Toilet Transfer Details (indicate cue type and reason): simulated to recliner          Functional mobility during ADLs: Moderate assistance;+2 for safety/equipment;Rolling walker General ADL Comments: increased time and effort for initation and sequencing, L lateral lean and inattention; fatigues easily      Vision       Perception     Praxis      Cognition Arousal/Alertness: Awake/alert Behavior During Therapy: WFL for tasks assessed/performed Overall Cognitive Status: Impaired/Different from baseline Area of Impairment: Attention;Memory;Following commands;Safety/judgement;Awareness;Problem solving                   Current Attention Level: Sustained Memory: Decreased short-term memory Following Commands: Follows one step  commands consistently;Follows one step commands with increased time Safety/Judgement: Decreased awareness of safety;Decreased awareness of deficits Awareness: Emergent Problem Solving: Difficulty  sequencing;Requires verbal cues;Requires tactile cues;Slow processing;Decreased initiation General Comments: Patient requires increased time for initation and sequencing of tasks, multimodal cueing with inattention to L side.        Exercises Exercises: General Upper Extremity General Exercises - Upper Extremity Shoulder Flexion: AAROM;Both;5 reps;Seated Shoulder Extension: AAROM;5 reps;Both;Seated   Shoulder Instructions       General Comments family present and supportive    Pertinent Vitals/ Pain       Pain Assessment: Faces Faces Pain Scale: No hurt  Home Living Family/patient expects to be discharged to:: Unsure Living Arrangements: Alone                                      Prior Functioning/Environment              Frequency  Min 3X/week        Progress Toward Goals  OT Goals(current goals can now be found in the care plan section)  Progress towards OT goals: Progressing toward goals  Acute Rehab OT Goals Patient Stated Goal: to get better OT Goal Formulation: With patient Time For Goal Achievement: 12/26/17 Potential to Achieve Goals: Good  Plan Discharge plan remains appropriate;Frequency remains appropriate    Co-evaluation    PT/OT/SLP Co-Evaluation/Treatment: Yes Reason for Co-Treatment: For patient/therapist safety;To address functional/ADL transfers   OT goals addressed during session: ADL's and self-care;Other (comment);Strengthening/ROM(mobility )      AM-PAC PT "6 Clicks" Daily Activity     Outcome Measure   Help from another person eating meals?: A Lot Help from another person taking care of personal grooming?: A Lot Help from another person toileting, which includes using toliet, bedpan, or urinal?: A Lot Help from another person bathing (including washing, rinsing, drying)?: A Lot Help from another person to put on and taking off regular upper body clothing?: A Lot Help from another person to put on and taking off  regular lower body clothing?: A Lot 6 Click Score: 12    End of Session Equipment Utilized During Treatment: Rolling walker  OT Visit Diagnosis: Unsteadiness on feet (R26.81);Other abnormalities of gait and mobility (R26.89);Muscle weakness (generalized) (M62.81);Hemiplegia and hemiparesis Hemiplegia - Right/Left: Left Hemiplegia - dominant/non-dominant: Non-Dominant Hemiplegia - caused by: Nontraumatic intracerebral hemorrhage   Activity Tolerance Patient tolerated treatment well   Patient Left in chair;with call bell/phone within reach;with chair alarm set;with family/visitor present   Nurse Communication Mobility status        Time: 7654-6503 OT Time Calculation (min): 30 min  Charges: OT General Charges $OT Visit: 1 Visit OT Treatments $Self Care/Home Management : 8-22 mins  Delight Stare, [place] Pager [phone] Office [phone]     Delight Stare 12/15/2017, 1:04 PM

## 2017-12-15 NOTE — Progress Notes (Signed)
CM met with patient and her daughter Traci [name]: [phone] about d/c plans. Daughter feels that [facility] for d/c is a better option at this time. CM will update CSW and CIR. CM following.

## 2017-12-15 NOTE — Progress Notes (Signed)
Physical Therapy Treatment Patient Details Name: Traci [name] MRN: [id number] DOB: May 24, 1934 Today's Date: 12/15/2017    History of Present Illness Pt is an 83 y.o. female admitted with L facial droop. CT showed a R lentiform hemorrhage with mild surrounding edema. PMH includes: dementia (living in ILF), sick sinus syndrome, hyperlipidemia, essential hypertension, CAD, chronic anticoagulation, atrial fibrillation, carotid artery disease, PNA     PT Comments    Pt making steady progress overall. Emphasis on coordination over strength with transitions, standing activity and gait training.   Follow Up Recommendations  CIR     Equipment Recommendations  Rolling walker with 5" wheels    Recommendations for Other Services       Precautions / Restrictions Precautions Precautions: Fall Restrictions Weight Bearing Restrictions: No    Mobility  Bed Mobility Overal bed mobility: Needs Assistance Bed Mobility: Supine to Sit     Supine to sit: Mod assist;HOB elevated     General bed mobility comments: mod assist for management of LB and trunk, forward scoot given increased time and multimodal cueing for sequencing and coordination  Transfers Overall transfer level: Needs assistance Equipment used: Rolling walker (2 wheeled) Transfers: Sit to/from Stand Sit to Stand: Mod assist;+2 safety/equipment         General transfer comment: mod assist +2 for sit to stand from EOB, cueing for hand placement and safety; increased time and effort with decreased initation and sequencing   Ambulation/Gait Ambulation/Gait assistance: Mod assist;+2 physical assistance Gait Distance (Feet): 36 Feet Assistive device: Rolling walker (2 wheeled) Gait Pattern/deviations: Step-through pattern;Decreased step length - left;Decreased stance time - left Gait velocity: decreased Gait velocity interpretation: <1.31 ft/sec, indicative of household ambulator General Gait Details: hemi paretic in  nature, mild list or sage forward and left.  Using tactile cues to help pt initiiate toe off and swing though   Stairs             Wheelchair Mobility    Modified Rankin (Stroke Patients Only) Modified Rankin (Stroke Patients Only) Pre-Morbid Rankin Score: No symptoms Modified Rankin: Moderately severe disability     Balance Overall balance assessment: Needs assistance Sitting-balance support: Feet supported;No upper extremity supported Sitting balance-Leahy Scale: Poor Sitting balance - Comments: left lateral and anterior lean at EOB Postural control: Left lateral lean Standing balance support: Bilateral upper extremity supported Standing balance-Leahy Scale: Poor Standing balance comment: relies on RW for support and external support from therapist.   Working on standing balance during a dynamic ADL task at the sink.                            Cognition Arousal/Alertness: Awake/alert Behavior During Therapy: WFL for tasks assessed/performed Overall Cognitive Status: Impaired/Different from baseline Area of Impairment: Attention;Memory;Following commands;Safety/judgement;Awareness;Problem solving                   Current Attention Level: Sustained Memory: Decreased short-term memory Following Commands: Follows one step commands consistently;Follows one step commands with increased time Safety/Judgement: Decreased awareness of safety;Decreased awareness of deficits Awareness: Emergent Problem Solving: Difficulty sequencing;Requires verbal cues;Requires tactile cues;Slow processing;Decreased initiation General Comments: Patient requires increased time for initation and sequencing of tasks, multimodal cueing with inattention to L side.      Exercises General Exercises - Upper Extremity Shoulder Flexion: AAROM;Both;5 reps;Seated Shoulder Extension: AAROM;5 reps;Both;Seated Other Exercises Other Exercises: Warm up hip/knee ROM with resisted extension  bil    General Comments General comments (  skin integrity, edema, etc.): Daughter present      Pertinent Vitals/Pain Pain Assessment: Faces Faces Pain Scale: No hurt    Home Living Family/patient expects to be discharged to:: Unsure Living Arrangements: Alone                  Prior Function            PT Goals (current goals can now be found in the care plan section) Acute Rehab PT Goals Patient Stated Goal: to get better PT Goal Formulation: With patient Time For Goal Achievement: 12/26/17 Potential to Achieve Goals: Good Progress towards PT goals: Progressing toward goals    Frequency    Min 4X/week      PT Plan Current plan remains appropriate    Co-evaluation PT/OT/SLP Co-Evaluation/Treatment: Yes Reason for Co-Treatment: For patient/therapist safety PT goals addressed during session: Mobility/safety with mobility OT goals addressed during session: ADL's and self-care      AM-PAC PT "6 Clicks" Daily Activity  Outcome Measure  Difficulty turning over in bed (including adjusting bedclothes, sheets and blankets)?: Unable Difficulty moving from lying on back to sitting on the side of the bed? : Unable Difficulty sitting down on and standing up from a chair with arms (e.g., wheelchair, bedside commode, etc,.)?: Unable Help needed moving to and from a bed to chair (including a wheelchair)?: A Lot Help needed walking in hospital room?: A Lot Help needed climbing 3-5 steps with a railing? : A Lot 6 Click Score: 9    End of Session   Activity Tolerance: Patient limited by fatigue Patient left: in chair;with call bell/phone within reach;with family/visitor present Nurse Communication: Mobility status PT Visit Diagnosis: Muscle weakness (generalized) (M62.81);Other symptoms and signs involving the nervous system (O96.295)     Time: 2841-3244 PT Time Calculation (min) (ACUTE ONLY): 37 min  Charges:  $Gait Training: 8-22 mins                      12/15/2017  Traci [name], [place] [phone]  (pager) [phone]  (office)   Traci [name] 12/15/2017, 1:15 PM

## 2017-12-15 NOTE — NC FL2 (Signed)
[place] LEVEL OF CARE SCREENING TOOL     IDENTIFICATION  Patient Name: Traci [name] Birthdate: November 01, 1934 Sex: female Admission Date (Current Location): 12/10/2017  [facility] and [place] Number:  Herbalist and Address:  [facility] [place], [place] [address] [place], [place] [address]      Provider Number: [id number]  Attending Physician Name and Address:  Cristal Ford, DO  Relative Name and Phone Number:  Raylene Everts, [facility], (C) [phone]    Current Level of Care: Hospital Recommended Level of Care: [place] Prior Approval Number:    Date Approved/Denied:   PASRR Number: [id number]  Discharge Plan: SNF    Current Diagnoses: Patient Active Problem List   Diagnosis Date Noted  . Abnormal chest x-ray 12/13/2017  . Essential hypertension   . Shortness of breath   . Supplemental oxygen dependent   . Hypokalemia   . Leukocytosis   . Diabetes mellitus type 2 in nonobese (HCC)   . Pulmonary hypertension ([place])   . Sick sinus syndrome ([place])   . Depression   . Chronic anticoagulation   . ICH (intracerebral hemorrhage) ([place]) 12/10/2017  . Intracranial bleed ([place]) 12/10/2017  . Dementia ([place]) 05/05/2016  . Right wrist fracture, with routine healing, subsequent encounter 05/01/2016  . Wrist fracture, right 04/29/2016  . Frequent falls 04/29/2016  . DM hyperosmolarity type II ([place]) 04/29/2016  . Diabetes mellitus with complication ([place])   . Physical deconditioning   . Acute blood loss anemia 02/06/2016  . Closed fracture of coccyx ([place]) 02/06/2016  . Female pelvic hematoma 02/02/2016  . Chest pain at rest 10/17/2014  . Chronic diastolic heart failure ([place]) 10/14/2014  . Peripheral neuropathy 08/31/2013  . Leg swelling 08/31/2013  . CAD (coronary artery disease) 04/06/2013  . OA (osteoarthritis) 04/06/2013  . Other malaise and fatigue 04/06/2013  . Essential hypertension, benign 04/06/2013   . Atrial fibrillation ([place]) 06/11/2010  . HYPERLIPIDEMIA 02/26/2010  . Cardiac pacemaker in situ 06/13/2009    Orientation RESPIRATION BLADDER Height & Weight     Self, Place  Normal Incontinent Weight: 182 lb 15.7 oz (83 kg) Height:  5\' 5"  (165.1 cm)  BEHAVIORAL SYMPTOMS/MOOD NEUROLOGICAL BOWEL NUTRITION STATUS      Continent Diet(Puree and nectar thick)  AMBULATORY STATUS COMMUNICATION OF NEEDS Skin   Limited Assist Verbally Normal                       Personal Care Assistance Level of Assistance  Bathing, Feeding, Dressing Bathing Assistance: Limited assistance Feeding assistance: Limited assistance Dressing Assistance: Limited assistance     Functional Limitations Info  Sight, Hearing, Speech Sight Info: Adequate Hearing Info: Adequate Speech Info: Adequate    SPECIAL CARE FACTORS FREQUENCY  PT (By licensed PT), OT (By licensed OT), Speech therapy     PT Frequency: 5x/Week OT Frequency: 5X/week     Speech Therapy Frequency: 5x/Week      Contractures Contractures Info: Not present    Additional Factors Info  Code Status, Allergies Code Status Info: DNR Allergies Info: Morphine, Penicillins           Current Medications (12/15/2017):  This is the current hospital active medication list Current Facility-Administered Medications  Medication Dose Route Frequency Provider Last Rate Last Dose  . acetaminophen (TYLENOL) tablet 650 mg  650 mg Oral Q6H PRN Rosalin Hawking, MD   650 mg at 12/14/17 0525   Or  . acetaminophen (TYLENOL) solution  650 mg  650 mg Per Tube Q6H PRN Rosalin Hawking, MD       Or  . acetaminophen (TYLENOL) suppository 650 mg  650 mg Rectal Q6H PRN Rosalin Hawking, MD      . butalbital-acetaminophen-caffeine (FIORICET, ESGIC) [phone] MG per tablet 1 tablet  1 tablet Oral Q12H PRN Rosalin Hawking, MD   1 tablet at 12/14/17 1224  . chlorhexidine (PERIDEX) 0.12 % solution 15 mL  15 mL Mouth Rinse BID Garvin Fila, MD   15 mL at 12/15/17 0829  .  diltiazem (CARDIZEM CD) 24 hr capsule 240 mg  240 mg Oral q morning - 10a Marliss Coots, PA-C   240 mg at 12/15/17 0272  . furosemide (LASIX) tablet 40 mg  40 mg Oral Daily Lavina Hamman, MD   40 mg at 12/15/17 0837  . labetalol (NORMODYNE,TRANDATE) injection 10-20 mg  10-20 mg Intravenous Q10 min PRN Rosalin Hawking, MD   20 mg at 12/15/17 0557  . losartan (COZAAR) tablet 50 mg  50 mg Oral BID Rosalin Hawking, MD   50 mg at 12/15/17 0836  . MEDLINE mouth rinse  15 mL Mouth Rinse q12n4p Garvin Fila, MD   15 mL at 12/13/17 1645  . Melatonin TABS 3 mg  3 mg Oral QHS PRN Greta Doom, MD   3 mg at 12/15/17 0006  . metoprolol tartrate (LOPRESSOR) tablet 50 mg  50 mg Oral BID Rinehuls, David L, PA-C   50 mg at 12/15/17 0836  . pantoprazole (PROTONIX) EC tablet 40 mg  40 mg Oral Daily Rosalin Hawking, MD   40 mg at 12/15/17 5366  . polyethylene glycol (MIRALAX / GLYCOLAX) packet 17 g  17 g Oral Daily Lavina Hamman, MD   17 g at 12/15/17 0836  . potassium chloride SA (K-DUR,KLOR-CON) CR tablet 40 mEq  40 mEq Oral Daily Bonnell Public Tublu, MD   40 mEq at 12/15/17 0837  . QUEtiapine (SEROQUEL) tablet 12.5 mg  12.5 mg Oral QHS Rosalin Hawking, MD   12.5 mg at 12/14/17 2114  . RESOURCE THICKENUP CLEAR   Oral PRN Lavina Hamman, MD      . senna-docusate (Senokot-S) tablet 2 tablet  2 tablet Oral BID Lavina Hamman, MD   2 tablet at 12/15/17 [phone]  . venlafaxine (EFFEXOR) tablet 100 mg  100 mg Oral Daily Lavina Hamman, MD   100 mg at 12/15/17 4742     Discharge Medications: Please see discharge summary for a list of discharge medications.  Relevant Imaging Results:  Relevant Lab Results:   Additional Information [id number]  Philippa Chester Nnamdi Dacus, LCSWA

## 2017-12-16 ENCOUNTER — Encounter: Payer: Self-pay | Admitting: Internal Medicine

## 2017-12-16 ENCOUNTER — Inpatient Hospital Stay
Admission: RE | Admit: 2017-12-16 | Discharge: 2018-01-27 | Disposition: E | Payer: PPO | Source: Ambulatory Visit | Attending: Internal Medicine | Admitting: Internal Medicine

## 2017-12-16 ENCOUNTER — Non-Acute Institutional Stay (SKILLED_NURSING_FACILITY): Payer: PPO | Admitting: Internal Medicine

## 2017-12-16 DIAGNOSIS — E118 Type 2 diabetes mellitus with unspecified complications: Secondary | ICD-10-CM | POA: Diagnosis not present

## 2017-12-16 DIAGNOSIS — M79644 Pain in right finger(s): Secondary | ICD-10-CM | POA: Diagnosis not present

## 2017-12-16 DIAGNOSIS — I2510 Atherosclerotic heart disease of native coronary artery without angina pectoris: Secondary | ICD-10-CM | POA: Diagnosis not present

## 2017-12-16 DIAGNOSIS — R5381 Other malaise: Secondary | ICD-10-CM | POA: Diagnosis not present

## 2017-12-16 DIAGNOSIS — I618 Other nontraumatic intracerebral hemorrhage: Secondary | ICD-10-CM

## 2017-12-16 DIAGNOSIS — F0151 Vascular dementia with behavioral disturbance: Secondary | ICD-10-CM | POA: Diagnosis not present

## 2017-12-16 DIAGNOSIS — I5033 Acute on chronic diastolic (congestive) heart failure: Secondary | ICD-10-CM | POA: Diagnosis not present

## 2017-12-16 DIAGNOSIS — W19XXXA Unspecified fall, initial encounter: Secondary | ICD-10-CM | POA: Diagnosis not present

## 2017-12-16 DIAGNOSIS — D72829 Elevated white blood cell count, unspecified: Secondary | ICD-10-CM | POA: Diagnosis not present

## 2017-12-16 DIAGNOSIS — F329 Major depressive disorder, single episode, unspecified: Secondary | ICD-10-CM | POA: Diagnosis not present

## 2017-12-16 DIAGNOSIS — I5032 Chronic diastolic (congestive) heart failure: Secondary | ICD-10-CM | POA: Diagnosis not present

## 2017-12-16 DIAGNOSIS — R2689 Other abnormalities of gait and mobility: Secondary | ICD-10-CM | POA: Diagnosis not present

## 2017-12-16 DIAGNOSIS — Z950 Presence of cardiac pacemaker: Secondary | ICD-10-CM | POA: Diagnosis not present

## 2017-12-16 DIAGNOSIS — R1312 Dysphagia, oropharyngeal phase: Secondary | ICD-10-CM | POA: Diagnosis not present

## 2017-12-16 DIAGNOSIS — I6200 Nontraumatic subdural hemorrhage, unspecified: Secondary | ICD-10-CM | POA: Diagnosis not present

## 2017-12-16 DIAGNOSIS — I272 Pulmonary hypertension, unspecified: Secondary | ICD-10-CM | POA: Diagnosis not present

## 2017-12-16 DIAGNOSIS — R1319 Other dysphagia: Secondary | ICD-10-CM | POA: Diagnosis not present

## 2017-12-16 DIAGNOSIS — I10 Essential (primary) hypertension: Secondary | ICD-10-CM | POA: Diagnosis not present

## 2017-12-16 DIAGNOSIS — E1143 Type 2 diabetes mellitus with diabetic autonomic (poly)neuropathy: Secondary | ICD-10-CM | POA: Diagnosis not present

## 2017-12-16 DIAGNOSIS — I739 Peripheral vascular disease, unspecified: Secondary | ICD-10-CM | POA: Diagnosis not present

## 2017-12-16 DIAGNOSIS — R1310 Dysphagia, unspecified: Secondary | ICD-10-CM | POA: Diagnosis not present

## 2017-12-16 DIAGNOSIS — E876 Hypokalemia: Secondary | ICD-10-CM | POA: Diagnosis not present

## 2017-12-16 DIAGNOSIS — E119 Type 2 diabetes mellitus without complications: Secondary | ICD-10-CM | POA: Diagnosis not present

## 2017-12-16 DIAGNOSIS — R600 Localized edema: Secondary | ICD-10-CM | POA: Diagnosis not present

## 2017-12-16 DIAGNOSIS — Z9981 Dependence on supplemental oxygen: Secondary | ICD-10-CM | POA: Diagnosis not present

## 2017-12-16 DIAGNOSIS — I610 Nontraumatic intracerebral hemorrhage in hemisphere, subcortical: Secondary | ICD-10-CM | POA: Diagnosis not present

## 2017-12-16 DIAGNOSIS — F319 Bipolar disorder, unspecified: Secondary | ICD-10-CM | POA: Diagnosis not present

## 2017-12-16 DIAGNOSIS — I4891 Unspecified atrial fibrillation: Secondary | ICD-10-CM

## 2017-12-16 DIAGNOSIS — F0390 Unspecified dementia without behavioral disturbance: Secondary | ICD-10-CM

## 2017-12-16 DIAGNOSIS — Z7401 Bed confinement status: Secondary | ICD-10-CM | POA: Diagnosis not present

## 2017-12-16 DIAGNOSIS — M1990 Unspecified osteoarthritis, unspecified site: Secondary | ICD-10-CM | POA: Diagnosis not present

## 2017-12-16 DIAGNOSIS — I4811 Longstanding persistent atrial fibrillation: Secondary | ICD-10-CM | POA: Diagnosis not present

## 2017-12-16 DIAGNOSIS — R262 Difficulty in walking, not elsewhere classified: Secondary | ICD-10-CM | POA: Diagnosis not present

## 2017-12-16 DIAGNOSIS — Z7901 Long term (current) use of anticoagulants: Secondary | ICD-10-CM | POA: Diagnosis not present

## 2017-12-16 DIAGNOSIS — M6281 Muscle weakness (generalized): Secondary | ICD-10-CM | POA: Diagnosis not present

## 2017-12-16 DIAGNOSIS — F05 Delirium due to known physiological condition: Secondary | ICD-10-CM | POA: Diagnosis not present

## 2017-12-16 DIAGNOSIS — M2550 Pain in unspecified joint: Secondary | ICD-10-CM | POA: Diagnosis not present

## 2017-12-16 DIAGNOSIS — E785 Hyperlipidemia, unspecified: Secondary | ICD-10-CM | POA: Diagnosis not present

## 2017-12-16 DIAGNOSIS — I495 Sick sinus syndrome: Secondary | ICD-10-CM | POA: Diagnosis not present

## 2017-12-16 LAB — BASIC METABOLIC PANEL
Anion gap: 11 (ref 5–15)
BUN: 20 mg/dL (ref 8–23)
CHLORIDE: 102 mmol/L (ref 98–111)
CO2: 27 mmol/L (ref 22–32)
CREATININE: 1.02 mg/dL — AB (ref 0.44–1.00)
Calcium: 9.5 mg/dL (ref 8.9–10.3)
GFR calc non Af Amer: 50 mL/min — ABNORMAL LOW (ref >60)
GFR, EST AFRICAN AMERICAN: 58 mL/min — AB (ref >60)
Glucose, Bld: 173 mg/dL — ABNORMAL HIGH (ref 70–99)
Potassium: 3.4 mmol/L — ABNORMAL LOW (ref 3.5–5.1)
Sodium: 140 mmol/L (ref 135–145)

## 2017-12-16 LAB — CBC
HCT: 38.6 % (ref 36.0–46.0)
HEMOGLOBIN: 12.5 g/dL (ref 12.0–15.0)
MCH: 31.5 pg (ref 26.0–34.0)
MCHC: 32.4 g/dL (ref 30.0–36.0)
MCV: 97.2 fL (ref 80.0–100.0)
Platelets: 234 10*3/uL (ref 150–400)
RBC: 3.97 MIL/uL (ref 3.87–5.11)
RDW: 13.9 % (ref 11.5–15.5)
WBC: 13.1 10*3/uL — AB (ref 4.0–10.5)
nRBC: 0 % (ref 0.0–0.2)

## 2017-12-16 LAB — GLUCOSE, CAPILLARY
GLUCOSE-CAPILLARY: 172 mg/dL — AB (ref 70–99)
GLUCOSE-CAPILLARY: 195 mg/dL — AB (ref 70–99)
Glucose-Capillary: 167 mg/dL — ABNORMAL HIGH (ref 70–99)
Glucose-Capillary: 155 mg/dL — ABNORMAL HIGH (ref 70–99)

## 2017-12-16 MED ORDER — QUETIAPINE FUMARATE 25 MG PO TABS: 12.5000 mg | ORAL_TABLET | Freq: Every day | ORAL | 0 refills | Status: AC

## 2017-12-16 MED ORDER — FUROSEMIDE 40 MG PO TABS: 40.0000 mg | ORAL_TABLET | Freq: Every day | ORAL | 0 refills | Status: AC

## 2017-12-16 MED ORDER — POLYETHYLENE GLYCOL 3350 17 G PO PACK: 17.0000 g | PACK | Freq: Every day | ORAL | 0 refills | Status: DC

## 2017-12-16 MED ORDER — MELATONIN 3 MG PO TABS: 3.0000 mg | ORAL_TABLET | Freq: Every evening | ORAL | 0 refills | Status: AC | PRN

## 2017-12-16 MED ORDER — PANTOPRAZOLE SODIUM 40 MG PO TBEC: 40.0000 mg | DELAYED_RELEASE_TABLET | Freq: Every day | ORAL | 0 refills | Status: DC

## 2017-12-16 MED ORDER — RESOURCE THICKENUP CLEAR PO POWD: ORAL | Status: DC

## 2017-12-16 MED ORDER — ACETAMINOPHEN 325 MG PO TABS: 650.0000 mg | ORAL_TABLET | Freq: Four times a day (QID) | ORAL | Status: AC | PRN

## 2017-12-16 NOTE — Progress Notes (Signed)
Location:    [facility] Room Number: 103/P Place of Service:  SNF (31) Provider:  Granville Lewis PA-C  Celene Squibb, MD  Patient Care Team: Celene Squibb, MD as PCP - General (Internal Medicine) Evans Lance, MD as Consulting Physician (Cardiology)  Extended Emergency Contact Information Primary Emergency Contact: Raylene Everts Address: [address] [place] [address] [place] Phone: [phone] Mobile Phone: [phone] Relation: Daughter Secondary Emergency Contact: Benjamine Mola [place] Mobile Phone: [phone] Relation: Granddaughter  Code Status:  DNR Goals of care: Advanced Directive information Advanced Directives 12/23/2017  Does Patient Have a Medical Advance Directive? Yes  Type of Advance Directive Out of facility DNR (pink MOST or yellow form)  Does patient want to make changes to medical advance directive? No - Patient declined  Copy of [place] in Chart? -  Would patient like information on creating a medical advance directive? No - Patient declined  Pre-existing out of facility DNR order (yellow form or pink MOST form) -     Chief Complaint  Patient presents with  . Hospitalization Follow-up    Hospitalization F/U Visit  After hospitalization for an intracranial hemorrhage.    HPI:  Pt is a 83 y.o. female seen today for a hospital follow-up after being hospitalized for a intracranial hemorrhage.  Patient was admitted with left arm weakness and facial droop with recent height surgery.  Found to have a right lentiform hemorrhage with surrounding edema.  She was treated aggressively with Cardene and labetalol.  And admitted to neurology service and hospitalist service became involved as well secondary to increased respiratory discomfort.  She was thought apparently to be in volume overload and was started on IV Lasix and her fluids were discontinued.  Her  other medical issues include progressing dementia as well as atrial fibrillation with sick sinus syndrome and pacemaker-.  Of note her INR was 2.4 on hospital admission and this was reversed no longer on anticoagulation because of the ICH.  This will have to be readdressed as patient progresses.  She continues on Cardizem for rate control as well as metoprolol.  She also has a history of chronic diastolic CHF- and again did receive IV Lasix in the hospital and was discharged on 40 mg of Lasix as an outpatient.  She also has a history of hypertension and continues on losartan her blood pressure is mildly elevated at 160/80 this evening she is just arrived from the hospital however and would like to get more readings before being very aggressive here.  She also has a history of type 2 diabetes it appears on discharge her glipizide was restarted however this cannot be crushed and apparently her meds will need to be crushed secondary to her dysphasia diet with crushed meds and nectar thick liquids.  Her blood sugar was 166 this evening at this point will monitor and will need to reinitiate at some point possibly an oral hypoglycemic depending how they run but would like to avoid hypoglycemia.  She also has baseline dementia apparently she lives alone but had close family suppor--t according to her sister who is at bedside-- they did do her cooking do her laundry and were with her a significant amount of time  Currently she is lying in bed comfortably she is bright alert in good spirits does have significant confusion but is very pleasant    Past Medical History:  Diagnosis Date  . Arthritis   . Atrial fibrillation ([place])   . Carotid artery disease ([place])    Right carotid endarectomy 1999  . Chronic anticoagulation    Followed by PMD  . Coronary atherosclerosis    a. Minor at cardiac catheterization 2005 b. cath 10/16/2014 40% prox LAD dx, otherwise minimal CAD  . Depression   . Essential  hypertension   . Glucose intolerance (impaired glucose tolerance)   . History of kidney stones   . Hyperlipidemia   . Pneumonia 2010  . Sick sinus syndrome [facility])    [employer] PPM   Past Surgical History:  Procedure Laterality Date  . ABDOMINAL HYSTERECTOMY    . APPENDECTOMY    . BREAST BIOPSY Bilateral    x 7 total  . CARDIAC CATHETERIZATION N/A 10/16/2014   Procedure: Left Heart Cath and Coronary Angiography;  Surgeon: Leonie Man, MD;  Location: [facility];  Service: Cardiovascular;  Laterality: N/A;  . CAROTID ENDARTERECTOMY Right 1999  . CATARACT EXTRACTION W/ INTRAOCULAR LENS  IMPLANT, BILATERAL Bilateral   . COLONOSCOPY     2006  . CYSTOSCOPY W/ URETERAL STENT PLACEMENT Left 04/19/2012   Procedure: CYSTOSCOPY WITH RETROGRADE PYELOGRAM/URETERAL STENT PLACEMENT ;  Surgeon: Ailene Rud, MD;  Location: [facility];  Service: Urology;  Laterality: Left;  . CYSTOSCOPY WITH RETROGRADE PYELOGRAM, URETEROSCOPY AND STENT PLACEMENT Left 06/30/2012   Procedure: CYSTOSCOPY WITH LEFT  RETROGRADE PYELOGRAM, URETEROSCOPY  with basketing of stone, AND STENT PLACEMENT, TRANSURETHRAL UNROOFING OF URETER.;  Surgeon: Alexis Frock, MD;  Location: [facility];  Service: Urology;  Laterality: Left;  . INSERT / REPLACE / REMOVE PACEMAKER  2006  . OPEN REDUCTION INTERNAL FIXATION (ORIF) DISTAL RADIAL FRACTURE Right 04/29/2016   Procedure: OPEN REDUCTION INTERNAL FIXATION (ORIF) DISTAL RADIAL FRACTURE;  Surgeon: Roseanne Kaufman, MD;  Location: [facility];  Service: Orthopedics;  Laterality: Right;  . ORIF DISTAL RADIUS FRACTURE Right 04/29/2016    Allergies  Allergen Reactions  . Morphine Nausea Only  . Penicillins Other (See Comments)    Has patient had a PCN reaction causing immediate rash, facial/tongue/throat swelling, SOB or lightheadedness with hypotension: NO Has patient had a PCN reaction causing severe rash involving mucus membranes or skin necrosis: no Has patient had a PCN reaction that  required hospitalization: NO Has patient had a PCN reaction occurring within the last 10 years: NO If all of the above answers are "NO", then may proceed with Cephalosporin use.     Allergies as of 12/10/2017      Reactions   Morphine Nausea Only   Penicillins Other (See Comments)   Has patient had a PCN reaction causing immediate rash, facial/tongue/throat swelling, SOB or lightheadedness with hypotension: NO Has patient had a PCN reaction causing severe rash involving mucus membranes or skin necrosis: no Has patient had a PCN reaction that required hospitalization: NO Has patient had a PCN reaction occurring within the last 10 years: NO If all of the above answers are "NO", then may proceed with Cephalosporin use.      Medication List        Accurate as of 12/19/2017  4:41 PM. Always use your most recent med list.          acetaminophen 325 MG tablet Commonly known as:  TYLENOL Take 2 tablets (650 mg total) by mouth every 6 (six) hours as needed for mild pain (or temp > 37.5 C (99.5 F)).   diltiazem 240 MG 24 hr capsule Commonly  known as:  CARDIZEM CD Take 240 mg by mouth every morning.   docusate sodium 100 MG capsule Commonly known as:  COLACE Take 100 mg by mouth daily.   furosemide 40 MG tablet Commonly known as:  LASIX Take 1 tablet (40 mg total) by mouth daily. Start taking on:  12/17/2017   losartan 50 MG tablet Commonly known as:  COZAAR Take 1 tablet (50 mg total) by mouth 2 (two) times daily. In the morning   Melatonin 3 MG Tabs Take 1 tablet (3 mg total) by mouth at bedtime as needed (SLEEP).   metoprolol tartrate 50 MG tablet Commonly known as:  LOPRESSOR Take 50 mg by mouth 2 (two) times daily.   omeprazole 20 MG capsule Commonly known as:  PRILOSEC Take 20 mg by mouth daily.   pravastatin 20 MG tablet Commonly known as:  PRAVACHOL Take 1 tablet by mouth at bedtime.   QUEtiapine 25 MG tablet Commonly known as:  SEROQUEL Take 0.5 tablets  (12.5 mg total) by mouth at bedtime.   RESOURCE THICKENUP CLEAR Powd Use prn liqiuids   venlafaxine 100 MG tablet Commonly known as:  EFFEXOR Take 100 mg by mouth daily.       Review of Systems   This is somewhat limited secondary to dementia.  In general she is not complain of any fever or chills.  Skin is not complain of rashes or itching she does have some mild bruising under her eyes bilaterally.  Head ears eyes nose mouth and throat she did not complain of visual changes or sore throat.  Respiratory does not complain of shortness of breath or cough.  Cardiac is not complaining of chest pain she has minimal lower extremity edema.  GI is not complaining of abdominal pain nausea vomiting diarrhea constipation.  GU no complaints of dysuria.   Musculoskeletal appears to have some left-sided weakness t This appears to be mild she is not complaining of joint pain.  Neurologic does not complain of dizziness or headache or syncope-.  Apparently does have some mild left-sided weakness.  In a slight mouth droop.  Psych she does not complain of being depressed or anxious she does have progressive dementia per discussion with her sister    Immunization History  Administered Date(s) Administered  . Influenza,inj,Quad PF,6+ Mos 10/14/2014  . Pneumococcal Conjugate-13 04/06/2013   Pertinent  Health Maintenance Due  Topic Date Due  . INFLUENZA VACCINE  01/15/2018 (Originally 08/27/2017)  . FOOT EXAM  01/15[birth year]2018 (Originally 09/10/1944)  . OPHTHALMOLOGY EXAM  01/15/2018 (Originally 09/10/1944)  . PNA vac Low Risk Adult (2 of 2 - PPSV23) 01/15/2018 (Originally 04/07/2014)  . HEMOGLOBIN A1C  06/12/2018  . DEXA SCAN  Completed   Fall Risk  04/06/2013  Falls in the past year? No   Functional Status Survey:    Temperature is 98.2 pulse 82 respirations 20 blood pressure 160/80 O2 saturation is 92% on room air  Physical Exam   In general this is a very pleasant elderly female  in no distress lying comfortably in bed.  Her skin is warm and dry she does have a small amount of bruising noted under her eyes bilaterally.  Eyes visual acuity appears to be intact sclera and conjunctive are clear pupils are equal round reactive light.  Oropharynx mucous membranes are moist has some slight residue on her tongue.  Chest is clear to auscultation there is no labored breathing.  Heart is regular irregular rate and rhythm without murmur gallop or rub  she has scant lower extremity edema.  Abdomen is obese soft nontender with positive bowel sounds.  Musculoskeletal has some mild left upper extremity weakness grip strength is reduced on the left compared to the right lower extremities difficult to fully assess because she is in bed but appears to have appropriate strength bilaterally.  Neurologic as noted above she does have some left-sided weakness this is most prominent in her left upper extremity- with reduced grip strength- she is able to raise both arms above her head.  She does appear to have a slight mouth droop her speech appears to be clear.  Psych she is oriented to self told me she lives in [place] but then when asked what her address gave me an inaccurate address per her sister-also was not really able to name the President of the [place]- and at one point said she was from [place] reviewed: Recent Labs    12/10/17 2336  12/14/17 0454 12/15/17 0520 12/14/2017 0426  NA 139   < > 140 140 140  K 3.6   < > 4.2 3.0* 3.4*  CL 105   < > 108 105 102  CO2 27   < > 26 27 27   GLUCOSE 170*   < > 136* 164* 173*  BUN 16   < > 13 14 20   CREATININE 0.94   < > 0.91 0.98 1.02*  CALCIUM 8.7*   < > 8.7* 9.1 9.5  MG 1.8  --   --  1.8  --    < > = values in this interval not displayed.   Recent Labs    11/24/17 1712 12/10/17 0701  AST 20 21  ALT 16 17  ALKPHOS 100 86  BILITOT 0.6 0.7  PROT 7.4 7.5  ALBUMIN 3.6 3.6   Recent Labs     11/24/17 1712 12/10/17 0701  12/14/17 0454 12/15/17 0520 12/06/2017 0426  WBC 12.8* 10.5   < > 10.9* 11.1* 13.1*  NEUTROABS 8.4* 6.4  --  7.4  --   --   HGB 14.3 14.9   < > 11.7* 12.2 12.5  HCT 43.7 45.1   < > 36.5 37.1 38.6  MCV 95.0 95.6   < > 99.5 96.9 97.2  PLT 297 280   < > 179 234 234   < > = values in this interval not displayed.   Lab Results  Component Value Date   TSH 2.115 12/16/2013   Lab Results  Component Value Date   HGBA1C 7.9 (H) 12/12/2017   Lab Results  Component Value Date   CHOL 170 12/12/2017   HDL 33 (L) 12/12/2017   LDLCALC 107 (H) 12/12/2017   TRIG 148 12/12/2017   CHOLHDL 5.2 12/12/2017    Significant Diagnostic Results in last 30 days:  Ct Angio Head W Or Wo Contrast  Result Date: 12/11/2017 CLINICAL DATA:  Follow-up examination for known intracranial hemorrhage. Patient with right lentiform intraparenchymal hematoma. EXAM: CT ANGIOGRAPHY HEAD AND NECK TECHNIQUE: Multidetector CT imaging of the head and neck was performed using the standard protocol during bolus administration of intravenous contrast. Multiplanar CT image reconstructions and MIPs were obtained to evaluate the vascular anatomy. Carotid stenosis measurements (when applicable) are obtained utilizing NASCET criteria, using the distal internal carotid diameter as the denominator. CONTRAST:  60mL ISOVUE-370 IOPAMIDOL (ISOVUE-370) INJECTION 76% COMPARISON:  Prior CT from 12/10/2017 as well as earlier studies. FINDINGS: CTA NECK FINDINGS Aortic arch: Visualized aortic arch of normal caliber with  normal branch pattern. Moderate atherosclerotic change about the aortic arch and origin of the great vessels without hemodynamically significant stenosis. Visualized subclavian arteries widely patent. Right carotid system: Scattered non stenotic plaque within the right common carotid artery without significant narrowing. Concentric calcified plaque about the right bifurcation/proximal right ICA without  hemodynamically significant stenosis. Probable remote changes related to prior endarterectomy noted. Right ICA patent from the bifurcation to the skull base without stenosis, dissection, or occlusion. Left carotid system: Left common carotid artery patent from its origin to the bifurcation without significant stenosis. Left common carotid artery medialized into the retropharyngeal space. Scattered a centric calcified plaque about the left bifurcation/proximal left ICA with associated stenosis of up to approximately 50% by NASCET criteria. Left ICA widely patent distally to the skull base without stenosis, dissection, or occlusion. Vertebral arteries: Both of the vertebral arteries arise from the subclavian arteries. The left vertebral artery appears to be slightly dominant. Left vertebral artery essentially occludes just beyond its origin, and remains occluded to approximately the level of C4-5. Distal reconstitution likely via muscular branches. Irregular attenuated flow seen distally within the left vertebral artery which is otherwise patent to the skull base. The right vertebral artery is occluded at its origin. Irregular distal reconstitution at approximately the level of C5-6 (series 8, image 231). Irregular thready and attenuated flow seen distally throughout the right ICA with multifocal moderate to severe segmental stenoses. Right vertebral artery is nearly occluded as it reaches the cranial vault (series 8, image 165). Skeleton: No acute osseus abnormality. No discrete lytic or blastic osseous lesions. Moderate cervical spondylolysis noted at C4-5 through C6-7. Other neck: No acute soft tissue abnormality within the neck. Chronic left maxillary sinusitis noted. Salivary glands within normal limits. No adenopathy. Thyroid within normal limits. Upper chest: Streak artifact from left-sided pacemaker/AICD. Left-sided pleural effusion with associated atelectasis partially visualized. Additional scattered  atelectatic changes noted within the visualized lungs. Review of the MIP images confirms the above findings CTA HEAD FINDINGS Anterior circulation: Petrous segments widely patent bilaterally. Advanced atheromatous plaque within the cavernous/supraclinoid ICAs with moderate multifocal narrowing. ICA termini widely patent. Left A1 segment irregular but patent without high-grade stenosis. Severe diffuse stenosis of the right A1 segment. Normal anterior communicating artery. Extensive atheromatous irregularity throughout the ACAs, left greater than right were there are multifocal moderate to severe stenoses. Left M1 irregular but widely patent without high-grade stenosis. Focal moderate proximal right M1 stenosis noted (series 8, image 92). Normal MCA bifurcations. No proximal M2 occlusion. Extensive small vessel atheromatous irregularity throughout the MCA branches bilaterally which are well perfused and fairly symmetric. Posterior circulation: Multifocal atheromatous irregularity within the dominant left V4 segment which is patent to the vertebrobasilar junction without high-grade stenosis. Patent left PICA. Diminutive right vertebral artery nearly occluded at the skull base, with scant irregular flow seen within the right V4 segment. Right V4 occludes prior to the vertebrobasilar junction. Right PICA of not seen. Basilar artery somewhat diminutive. Severe atheromatous change throughout the mid and distal basilar artery with multifocal severe stenoses. Anterior inferior cerebral arteries patent bilaterally. Superior cerebral arteries grossly patent. Predominant fetal type origin of the PCAs with patent and robust posterior communicating arteries. PCAs are patent to their distal aspects with multifocal moderate to severe distal P3 and P4 stenoses. Venous sinuses: Not well assessed due to arterial timing of the contrast bolus. Anatomic variants: Fetal type origin of the PCAs with underlying diminutive vertebrobasilar  system. Focal outpouching at the cavernous left ICA in the  region of the left hypophyseal artery favored to reflect a normal vascular infundibulum. No AVM or other vascular abnormality seen underlying the right lentiform hemorrhage. Delayed phase: No abnormal enhancement. Right lentiform nucleus hemorrhage relatively stable measuring 13 x 14 mm. No intraventricular extension. Review of the MIP images confirms the above findings IMPRESSION: 1. No significant interval change in size and appearance right lenticular hematoma. No underlying vascular abnormality identified. 2. Severe vertebrobasilar atherosclerotic disease as above. Proximal-mid vertebral arteries are occluded within the neck with distal reconstitution. Right vertebral subsequently occludes at the skull base. Multifocal severe stenoses seen throughout the basilar artery. Overall, vertebrobasilar system is diminutive with fetal type origin of the PCAs. 3. Approximate 50% atheromatous stenosis at the left carotid bifurcation. 4. Sequelae of remote right carotid endarterectomy, with no significant residual or recurrent stenosis. 5. Additional extensive atherosclerotic change throughout the intracranial circulation as above. No large vessel occlusion. Electronically Signed   By: Jeannine Boga M.D.   On: 12/11/2017 17:12   Ct Head Wo Contrast  Result Date: 12/10/2017 CLINICAL DATA:  Intracerebral hemorrhage follow-up EXAM: CT HEAD WITHOUT CONTRAST TECHNIQUE: Contiguous axial images were obtained from the base of the skull through the vertex without intravenous contrast. COMPARISON:  CT head 12/10/2017 FINDINGS: Brain: Right posterior basal ganglia and deep white matter hematoma is unchanged in size measuring 13 x 13 by 26 mm. No intraventricular extension. No mass-effect or midline shift. Moderate atrophy. Ventricular enlargement due to atrophy is unchanged. Extensive chronic microvascular ischemic changes in the white matter. Densely calcified  mass along the right tentorium projecting inferiorly is unchanged measuring 12 mm compatible with meningioma. Vascular: Extensive atherosclerotic calcification. Negative for hyperdense vessel Skull: Negative Sinuses/Orbits: Extensive mucosal edema left maxillary ethmoid and frontal sinus unchanged. Calcified secretions left ethmoid sinus. Other: None IMPRESSION: Right lenticular hematoma unchanged from earlier today. No intraventricular hemorrhage or midline shift. Electronically Signed   By: Franchot Gallo M.D.   On: 12/10/2017 19:19   Ct Angio Neck W Or Wo Contrast  Result Date: 12/11/2017 CLINICAL DATA:  Follow-up examination for known intracranial hemorrhage. Patient with right lentiform intraparenchymal hematoma. EXAM: CT ANGIOGRAPHY HEAD AND NECK TECHNIQUE: Multidetector CT imaging of the head and neck was performed using the standard protocol during bolus administration of intravenous contrast. Multiplanar CT image reconstructions and MIPs were obtained to evaluate the vascular anatomy. Carotid stenosis measurements (when applicable) are obtained utilizing NASCET criteria, using the distal internal carotid diameter as the denominator. CONTRAST:  42mL ISOVUE-370 IOPAMIDOL (ISOVUE-370) INJECTION 76% COMPARISON:  Prior CT from 12/10/2017 as well as earlier studies. FINDINGS: CTA NECK FINDINGS Aortic arch: Visualized aortic arch of normal caliber with normal branch pattern. Moderate atherosclerotic change about the aortic arch and origin of the great vessels without hemodynamically significant stenosis. Visualized subclavian arteries widely patent. Right carotid system: Scattered non stenotic plaque within the right common carotid artery without significant narrowing. Concentric calcified plaque about the right bifurcation/proximal right ICA without hemodynamically significant stenosis. Probable remote changes related to prior endarterectomy noted. Right ICA patent from the bifurcation to the skull base  without stenosis, dissection, or occlusion. Left carotid system: Left common carotid artery patent from its origin to the bifurcation without significant stenosis. Left common carotid artery medialized into the retropharyngeal space. Scattered a centric calcified plaque about the left bifurcation/proximal left ICA with associated stenosis of up to approximately 50% by NASCET criteria. Left ICA widely patent distally to the skull base without stenosis, dissection, or occlusion. Vertebral arteries: Both of the  vertebral arteries arise from the subclavian arteries. The left vertebral artery appears to be slightly dominant. Left vertebral artery essentially occludes just beyond its origin, and remains occluded to approximately the level of C4-5. Distal reconstitution likely via muscular branches. Irregular attenuated flow seen distally within the left vertebral artery which is otherwise patent to the skull base. The right vertebral artery is occluded at its origin. Irregular distal reconstitution at approximately the level of C5-6 (series 8, image 231). Irregular thready and attenuated flow seen distally throughout the right ICA with multifocal moderate to severe segmental stenoses. Right vertebral artery is nearly occluded as it reaches the cranial vault (series 8, image 165). Skeleton: No acute osseus abnormality. No discrete lytic or blastic osseous lesions. Moderate cervical spondylolysis noted at C4-5 through C6-7. Other neck: No acute soft tissue abnormality within the neck. Chronic left maxillary sinusitis noted. Salivary glands within normal limits. No adenopathy. Thyroid within normal limits. Upper chest: Streak artifact from left-sided pacemaker/AICD. Left-sided pleural effusion with associated atelectasis partially visualized. Additional scattered atelectatic changes noted within the visualized lungs. Review of the MIP images confirms the above findings CTA HEAD FINDINGS Anterior circulation: Petrous segments  widely patent bilaterally. Advanced atheromatous plaque within the cavernous/supraclinoid ICAs with moderate multifocal narrowing. ICA termini widely patent. Left A1 segment irregular but patent without high-grade stenosis. Severe diffuse stenosis of the right A1 segment. Normal anterior communicating artery. Extensive atheromatous irregularity throughout the ACAs, left greater than right were there are multifocal moderate to severe stenoses. Left M1 irregular but widely patent without high-grade stenosis. Focal moderate proximal right M1 stenosis noted (series 8, image 92). Normal MCA bifurcations. No proximal M2 occlusion. Extensive small vessel atheromatous irregularity throughout the MCA branches bilaterally which are well perfused and fairly symmetric. Posterior circulation: Multifocal atheromatous irregularity within the dominant left V4 segment which is patent to the vertebrobasilar junction without high-grade stenosis. Patent left PICA. Diminutive right vertebral artery nearly occluded at the skull base, with scant irregular flow seen within the right V4 segment. Right V4 occludes prior to the vertebrobasilar junction. Right PICA of not seen. Basilar artery somewhat diminutive. Severe atheromatous change throughout the mid and distal basilar artery with multifocal severe stenoses. Anterior inferior cerebral arteries patent bilaterally. Superior cerebral arteries grossly patent. Predominant fetal type origin of the PCAs with patent and robust posterior communicating arteries. PCAs are patent to their distal aspects with multifocal moderate to severe distal P3 and P4 stenoses. Venous sinuses: Not well assessed due to arterial timing of the contrast bolus. Anatomic variants: Fetal type origin of the PCAs with underlying diminutive vertebrobasilar system. Focal outpouching at the cavernous left ICA in the region of the left hypophyseal artery favored to reflect a normal vascular infundibulum. No AVM or other  vascular abnormality seen underlying the right lentiform hemorrhage. Delayed phase: No abnormal enhancement. Right lentiform nucleus hemorrhage relatively stable measuring 13 x 14 mm. No intraventricular extension. Review of the MIP images confirms the above findings IMPRESSION: 1. No significant interval change in size and appearance right lenticular hematoma. No underlying vascular abnormality identified. 2. Severe vertebrobasilar atherosclerotic disease as above. Proximal-mid vertebral arteries are occluded within the neck with distal reconstitution. Right vertebral subsequently occludes at the skull base. Multifocal severe stenoses seen throughout the basilar artery. Overall, vertebrobasilar system is diminutive with fetal type origin of the PCAs. 3. Approximate 50% atheromatous stenosis at the left carotid bifurcation. 4. Sequelae of remote right carotid endarterectomy, with no significant residual or recurrent stenosis. 5. Additional extensive atherosclerotic  change throughout the intracranial circulation as above. No large vessel occlusion. Electronically Signed   By: Jeannine Boga M.D.   On: 12/11/2017 17:12   Dg Chest Port 1 View  Result Date: 12/11/2017 CLINICAL DATA:  Shortness of Breath EXAM: PORTABLE CHEST 1 VIEW COMPARISON:  December 10, 2017 FINDINGS: There is airspace opacity in the left lower lobe with left pleural effusion. The right lung is clear. There is cardiomegaly with pulmonary vascularity normal. There is aortic atherosclerosis. Pacemaker leads are attached to the right atrium and right ventricle. Bones appear osteoporotic. There are surgical clips in the lower right neck region. IMPRESSION: Consolidation left lower lobe with small left pleural effusion. Right lung clear. There is stable cardiomegaly with aortic atherosclerosis. Pacemaker leads attached to right atrium and right ventricle. Bones osteoporotic. Aortic Atherosclerosis (ICD10-I70.0). Electronically Signed   By:  Lowella Grip III M.D.   On: 12/11/2017 07:58   Dg Chest Portable 1 View  Result Date: 12/10/2017 CLINICAL DATA:  Shortness of breath.  Code stroke. EXAM: PORTABLE CHEST 1 VIEW COMPARISON:  11/24/2017. FINDINGS: Cardiac pacer noted with lead tips in right atrium right ventricle. Cardiomegaly with diffuse bilateral from interstitial prominence and left-sided pleural effusion. Findings consistent CHF. No pneumothorax. No acute bony abnormality IMPRESSION: Cardiac pacer with lead tips over the right atrium right ventricle. Cardiomegaly with diffuse bilateral from interstitial prominence and small left pleural effusion consistent with CHF. Electronically Signed   By: Marcello Moores  Register   On: 12/10/2017 08:26   Dg Swallowing Func-speech Pathology  Result Date: 12/11/2017 Objective Swallowing Evaluation: Type of Study: MBS-Modified Barium Swallow Study  Patient Details Name: TABOR BARTRAM MRN: [id number] Date of Birth: 1934-06-07 Today's Date: 12/11/2017 Time: SLP Start Time (ACUTE ONLY): 5427 -SLP Stop Time (ACUTE ONLY): 0623 SLP Time Calculation (min) (ACUTE ONLY): 25 min Past Medical History: Past Medical History: Diagnosis Date . Arthritis  . Atrial fibrillation ([place])  . Carotid artery disease ([place])   Right carotid endarectomy 1999 . Chronic anticoagulation   Followed by PMD . Coronary atherosclerosis   a. Minor at cardiac catheterization 2005 b. cath 10/16/2014 40% prox LAD dx, otherwise minimal CAD . Depression  . Essential hypertension  . Glucose intolerance (impaired glucose tolerance)  . History of kidney stones  . Hyperlipidemia  . Pneumonia 2010 . Sick sinus syndrome ([facility])   [employer] PPM Past Surgical History: Past Surgical History: Procedure Laterality Date . ABDOMINAL HYSTERECTOMY   . APPENDECTOMY   . BREAST BIOPSY Bilateral   x 7 total . CARDIAC CATHETERIZATION N/A 10/16/2014  Procedure: Left Heart Cath and Coronary Angiography;  Surgeon: Leonie Man, MD;  Location: [facility];  Service:  Cardiovascular;  Laterality: N/A; . CAROTID ENDARTERECTOMY Right 1999 . CATARACT EXTRACTION W/ INTRAOCULAR LENS  IMPLANT, BILATERAL Bilateral  . COLONOSCOPY    2006 . CYSTOSCOPY W/ URETERAL STENT PLACEMENT Left 04/19/2012  Procedure: CYSTOSCOPY WITH RETROGRADE PYELOGRAM/URETERAL STENT PLACEMENT ;  Surgeon: Ailene Rud, MD;  Location: [facility];  Service: Urology;  Laterality: Left; . CYSTOSCOPY WITH RETROGRADE PYELOGRAM, URETEROSCOPY AND STENT PLACEMENT Left 06/30/2012  Procedure: CYSTOSCOPY WITH LEFT  RETROGRADE PYELOGRAM, URETEROSCOPY  with basketing of stone, AND STENT PLACEMENT, TRANSURETHRAL UNROOFING OF URETER.;  Surgeon: Alexis Frock, MD;  Location: [facility];  Service: Urology;  Laterality: Left; . INSERT / REPLACE / REMOVE PACEMAKER  2006 . OPEN REDUCTION INTERNAL FIXATION (ORIF) DISTAL RADIAL FRACTURE Right 04/29/2016  Procedure: OPEN REDUCTION INTERNAL FIXATION (ORIF) DISTAL RADIAL FRACTURE;  Surgeon: Roseanne Kaufman, MD;  Location: [facility];  Service: Orthopedics;  Laterality: Right; . ORIF DISTAL RADIUS FRACTURE Right 04/29/2016 HPI: Pt is an 83 y.o. female admitted with L facial droop. CT showed a R lentiform hemorrhage with mild surrounding edema. PMH includes: dementia (living in ILF), sick sinus syndrome, hyperlipidemia, essential hypertension, CAD, chronic anticoagulation, atrial fibrillation, carotid artery disease, PNA  Subjective: pt alert, pleasant, but confused Assessment / Plan / Recommendation CHL IP CLINICAL IMPRESSIONS 12/11/2017 Clinical Impression Pt has a moderate oral and mild pharyngeal dysphagia with sensorimotor deficits. Orally she has weak labial seal and lingual manipulation, allowing premature spillage with thin liquids, saliva, and even a large piece of unmasticated cracker without pt awareness. She has mild-moderate L buccal pocketing, but can attend to this residue when given Min-Mod cues for lingual sweep. Her timing is mildly impaired for swallow trigger with thin liquids,  likely at least in part related to decreased oral containement and/or decreased sensation, although no aspiration occurs. She consistently penetrates, with penetrates getting close to the true vocal folds, but penetrates clear the laryngeal vestibule upon completion of the swallow. Nectar thick liquids were also tested with no penetration observed. Recommend to start wtih Dys 1 diet and thin liquids with full supervision. Should pt experience any overt difficulty during meals, nectar thick liquids could be considered as a safer alternative. SLP will continue to follow for tolerance and readiness to advance. SLP Visit Diagnosis Dysphagia, oropharyngeal phase (R13.12) Attention and concentration deficit following -- Frontal lobe and executive function deficit following -- Impact on safety and function Mild aspiration risk;Moderate aspiration risk   CHL IP TREATMENT RECOMMENDATION 12/11/2017 Treatment Recommendations Therapy as outlined in treatment plan below   Prognosis 12/11/2017 Prognosis for Safe Diet Advancement Good Barriers to Reach Goals Cognitive deficits Barriers/Prognosis Comment -- CHL IP DIET RECOMMENDATION 12/11/2017 SLP Diet Recommendations Dysphagia 1 (Puree) solids;Thin liquid Liquid Administration via Cup;Straw Medication Administration Crushed with puree Compensations Slow rate;Small sips/bites;Minimize environmental distractions;Lingual sweep for clearance of pocketing;Monitor for anterior loss Postural Changes Seated upright at 90 degrees   CHL IP OTHER RECOMMENDATIONS 12/11/2017 Recommended Consults -- Oral Care Recommendations Oral care BID Other Recommendations Have oral suction available   CHL IP FOLLOW UP RECOMMENDATIONS 12/11/2017 Follow up Recommendations (No Data)   CHL IP FREQUENCY AND DURATION 12/11/2017 Speech Therapy Frequency (ACUTE ONLY) min 2x/week Treatment Duration 2 weeks      CHL IP ORAL PHASE 12/11/2017 Oral Phase Impaired Oral - Pudding Teaspoon -- Oral - Pudding Cup -- Oral -  Honey Teaspoon -- Oral - Honey Cup -- Oral - Nectar Teaspoon -- Oral - Nectar Cup -- Oral - Nectar Straw Weak lingual manipulation;Reduced posterior propulsion Oral - Thin Teaspoon -- Oral - Thin Cup Weak lingual manipulation;Reduced posterior propulsion;Left anterior bolus loss Oral - Thin Straw Weak lingual manipulation;Reduced posterior propulsion Oral - Puree Weak lingual manipulation;Reduced posterior propulsion;Left pocketing in lateral sulci Oral - Mech Soft Weak lingual manipulation;Reduced posterior propulsion;Left pocketing in lateral sulci;Left anterior bolus loss Oral - Regular -- Oral - Multi-Consistency -- Oral - Pill -- Oral Phase - Comment --  CHL IP PHARYNGEAL PHASE 12/11/2017 Pharyngeal Phase Impaired Pharyngeal- Pudding Teaspoon -- Pharyngeal -- Pharyngeal- Pudding Cup -- Pharyngeal -- Pharyngeal- Honey Teaspoon -- Pharyngeal -- Pharyngeal- Honey Cup -- Pharyngeal -- Pharyngeal- Nectar Teaspoon -- Pharyngeal -- Pharyngeal- Nectar Cup -- Pharyngeal -- Pharyngeal- Nectar Straw WFL Pharyngeal -- Pharyngeal- Thin Teaspoon -- Pharyngeal -- Pharyngeal- Thin Cup Penetration/Aspiration before swallow Pharyngeal Material enters airway, remains ABOVE vocal cords and not  ejected out Pharyngeal- Thin Straw Penetration/Aspiration before swallow Pharyngeal Material enters airway, remains ABOVE vocal cords and not ejected out Pharyngeal- Puree WFL Pharyngeal -- Pharyngeal- Mechanical Soft WFL Pharyngeal -- Pharyngeal- Regular -- Pharyngeal -- Pharyngeal- Multi-consistency -- Pharyngeal -- Pharyngeal- Pill -- Pharyngeal -- Pharyngeal Comment --  CHL IP CERVICAL ESOPHAGEAL PHASE 12/11/2017 Cervical Esophageal Phase WFL Pudding Teaspoon -- Pudding Cup -- Honey Teaspoon -- Honey Cup -- Nectar Teaspoon -- Nectar Cup -- Nectar Straw -- Thin Teaspoon -- Thin Cup -- Thin Straw -- Puree -- Mechanical Soft -- Regular -- Multi-consistency -- Pill -- Cervical Esophageal Comment -- Germain Osgood 12/11/2017, 5:30 PM   Germain Osgood, M.A. CCC-SLP Acute Rehabilitation Services Pager [phone] Office [phone]             Ct Head Code Stroke Wo Contrast  Result Date: 12/10/2017 CLINICAL DATA:  Code stroke. 83 year old female with sudden onset left side weakness. EXAM: CT HEAD WITHOUT CONTRAST TECHNIQUE: Contiguous axial images were obtained from the base of the skull through the vertex without intravenous contrast. COMPARISON:  Head CT without contrast 11/24/2017 and earlier. FINDINGS: Brain: There is an oval or diamond-shaped hyperdense hemorrhage in the right lentiform and Corona radiata encompassing 19 x 17 x 25 millimeters (AP by transverse by CC) for an estimated blood volume of 4 milliliters. Mild surrounding edema. Minor regional mass effect. No extension into the ventricles or outside of the brain parenchyma. Superimposed confluent bilateral cerebral white matter hypodensity and deep gray matter heterogeneity. Cavum septum pellucidum, normal variant. No acute cortically based infarct identified. 12-13 millimeter calcified right tentorial meningioma re-demonstrated, and was 10 millimeters in 2012. Vascular: Calcified atherosclerosis at the skull base. No suspicious intracranial vascular hyperdensity. The Skull: Stable and intact. Sinuses/Orbits: Continued left OMC obstructive pattern sinus disease. Stable sinus and mastoid aeration. Other: Partially resolved left scalp hematoma since October. No new scalp or orbits soft tissue abnormality. ASPECTS [facility] Stroke Program Early CT Score) Total score (0-10 with 10 being normal): Not applicable, acute hemorrhage. IMPRESSION: 1. Small acute right lentiform hemorrhage with estimated blood volume of 4 mL. Mild surrounding edema. No ventricular or extra-axial extension. 2. Critical Value/emergent results were called by telephone at the time of interpretation on 12/10/2017 at 7:03 am to Dr. Ripley Fraise , who verbally acknowledged these results. 3. Underlying  chronic small vessel disease. Partially resolved left scalp hematoma since October. Small chronic right tentorial meningioma. Electronically Signed   By: Genevie Ann M.D.   On: 12/10/2017 07:04    Assessment/Plan  #1 history of intracranial hemorrhage with etiology not totally clear-at this point not an candidate for anticoagulation shoe will need re-evaluation as an outpatient and apparently this has been scheduled for December 14- she continues with her baseline confusion with dementia.  She does have some mild left upper extremity weakness and will need PT and OT-.  2 history of chronic atrial fibrillation with sick sinus syndrome status post pacemaker-again she is not on anticoagulation because of the ICH.  Rate at this point appears controlled on Cardizem as well as Lopressor.  3.  History of hypertension her systolic somewhat elevated this evening she has just come back from the hospital however at this point will monitor again she is on Lopressor and Cardizem as well as losartan.  4.-  History of dementia apparently with some psychosis--bipolar disorder-she is on Seroquel low-dose 12.5 mg nightly is pleasantly confused this evening.  5.  History of depression continues on venlafaxine.  6.  History of  hyperlipidemia continues on pravastatin--lipid panel done in the hospital showed an LDL of 107 appears previous levels were around 100.  7.  History of diabetes type 2 was recommendation to possibly restart her Glucotrol which she was on apparently at home- however this cannot be crushed-at this point will hold the oral diabetic meds and continue to monitor her blood sugars it was 166 this evening we will see how she runs but suspect she may need some oral hypoglycemic here if sugars are elevated.  8.  History of diastolic CHF this appears well compensated at this point she is on 40 mg of Lasix I do not see that she is on potassium supplementation will add 20 mEq a day since her potassium  actually is slightly low at 3.4 today- will need an updated metabolic panel later this week  9.  Leukocytosis appears this is been somewhat chronic during her hospitalization suspect there is a reactive component to this will have this updated f as well appears white count is run between 11-13 recently.  10.  Slight mouth film will treat with nystatin swab 3 times daily until resolved  CPT- 99310-of note greater than 40 minutes spent assessing patient reviewing her chart and labs and coordinating and formulating a plan of care for numerous diagnoses- of note greater than 50% of time spent coordinating a plan of care with input as noted above

## 2017-12-16 NOTE — Clinical Social Work Placement (Signed)
Nurse to call report to [facility]  NOTE  Date:  12/14/2017  Patient Details  Name: Traci [name] MRN: [id number] Date of Birth: 10-Dec-1934  Clinical Social Work is seeking post-discharge placement for this patient at the [place] level of care (*CSW will initial, date and re-position this form in  chart as items are completed):  Yes   Patient/family provided with [facility] Work Department's list of facilities offering this level of care within the geographic area requested by the patient (or if unable, by the patient's family).  Yes   Patient/family informed of their freedom to choose among providers that offer the needed level of care, that participate in Medicare, Medicaid or managed care program needed by the patient, have an available bed and are willing to accept the patient.  Yes   Patient/family informed of [facility]'s ownership interest in [facility] and [facility], as well as of the fact that they are under no obligation to receive care at these facilities.  PASRR submitted to EDS on 12/15/17     PASRR number received on       Existing PASRR number confirmed on 12/15/17     FL2 transmitted to all facilities in geographic area requested by pt/family on       FL2 transmitted to all facilities within larger geographic area on 12/15/17     Patient informed that his/her managed care company has contracts with or will negotiate with certain facilities, including the following:        Yes   Patient/family informed of bed offers received.  Patient chooses bed at [facility]     Physician recommends and patient chooses bed at      Patient to be transferred to [facility] on 12/23/2017.  Patient to be transferred to facility by [facility]     Patient family notified on 12/20/2017 of transfer.  Name of family member notified:  Helene Kelp      PHYSICIAN       Additional Comment:     _______________________________________________ Gelene Mink, [place] 12/14/2017, 12:09 PM

## 2017-12-16 NOTE — Progress Notes (Signed)
  Speech Language Pathology Treatment: Dysphagia;Cognitive-Linquistic  Patient Details Name: Traci [name] MRN: [id number] DOB: 1934/11/29 Today's Date: 12/22/2017 Time: 0910-0940 SLP Time Calculation (min) (ACUTE ONLY): 30 min  Assessment / Plan / Recommendation Clinical Impression  Pt was seen for skilled ST targeting goals for dysphagia and cognition.  Pt has intermittent immediate coughing on trials of thin liquids which are mitigated with cues for pacing and with bolus temperature modifications (ice water lead to more coughing versus room temperature).  No coughing with nectar thick liquids or purees, even when pt was allowed to self pace.  Pt reports feeling hesitant about drinking water because she doesn't want to get "strangled."  Pt needed max cues to reorient to date and situation but was independently oriented to place.  Pt also needed max cues to identify deficits post hemorrhage and their functional implications on her previous level of independence.  Pt was left in bed with bed alarm set and all needs within reach.  Continue per current plan of care.    HPI HPI: Pt is an 83 y.o. female admitted with L facial droop. CT showed a R lentiform hemorrhage with mild surrounding edema. PMH includes: dementia (living in ILF), sick sinus syndrome, hyperlipidemia, essential hypertension, CAD, chronic anticoagulation, atrial fibrillation, carotid artery disease, PNA      SLP Plan  Continue with current plan of care       Recommendations  Diet recommendations: Dysphagia 1 (puree);Nectar-thick liquid Liquids provided via: Cup;Straw Medication Administration: Crushed with puree Supervision: Staff to assist with self feeding;Full supervision/cueing for compensatory strategies Compensations: Slow rate;Small sips/bites Postural Changes and/or Swallow Maneuvers: Seated upright 90 degrees                Oral Care Recommendations: Oral care BID Follow up Recommendations: Skilled Nursing  facility SLP Visit Diagnosis: Dysphagia, oropharyngeal phase (R13.12);Cognitive communication deficit (R41.841) Plan: Continue with current plan of care       GO                PageSelinda Orion 12/06/2017, 9:46 AM

## 2017-12-16 NOTE — Discharge Summary (Signed)
Physician Discharge Summary  Traci [name] RKY:[id number] DOB: [birth year] DOA: 12/10/2017  PCP: Celene Squibb, MD  Admit date: 12/10/2017 Discharge date: 12/25/2017  Time spent: 35 minutes  Recommendations for Outpatient Follow-up:  1. Recommend continuation of Lasix 40 mg daily and outpatient titration if needed based on be met in 1 week 2. Needs outpatient consideration for DO [facility] for anticoagulation once patient's brain bleed resolves  3. will need outpatient management dementia and titration of meds if needed  Discharge Diagnoses:  Active Problems:   ICH (intracerebral hemorrhage) (HCC)   Intracranial bleed (HCC)   Essential hypertension   Shortness of breath   Supplemental oxygen dependent   Hypokalemia   Leukocytosis   Diabetes mellitus type 2 in nonobese [facility])   Pulmonary hypertension (HCC)   Sick sinus syndrome (HCC)   Depression   Chronic anticoagulation   Abnormal chest x-ray   Discharge Condition: Improved  Diet recommendation: Dysphagia 1  Filed Weights   12/10/17 0703 12/14/17 0500  Weight: 74.8 kg 83 kg    History of present illness:  83 year old female Sick sinus syndrome + pacer + atrial fibrillation chads score about 6 or 7 Prior history fall and hematoma 01/2016 and buttocks CAD documented on cath 2005 which was minor with history of probable systolic heart failure DM TY 2 Depression Prior right wrist comminuted fracture 04/2016 secondary to frequent falls Bipolar PAD with right carotid endarterectomy previously Mild dementia Hyperlipidemia  Admitted from [facility] arm week + facial droop in the setting of recent eye surgery-found to have right lentiform hemorrhage with volume 4 mm and mild surrounding edema-patient was given Kcentra blood pressure was ordered Cardene  and labetalol Was admitted by Neurology however because of increased work of breathing 11/17 hospitalist service was consulted Patient was started on IV Lasix fluids were  discontinued   Hospital Course:  intracranial hemorrhage-etiology not completely clear however not a candidate currently for anticoagulation at this time-we will need outpatient reevaluation about 1 month may be on 12/14 to discuss his Chronic atrial fibrillation with sick sinus syndrome and pacemaker-INR was 2.4 on admission and reversed-anticoagulation to be discussed once ICH resolved-we will continue Cardizem CD 40 mg as well as metoprolol 50 twice daily Acute on chronic diastolic heart failure echo did not confirm diastolic parameters however prior echo 09/2013 did-patient was placed on IV Lasix and we assumed care-currently on oral Lasix 40 mg and will continue the same as an outpatient will need basic metabolic panel as well as fluid status to guide management HTN-continue losartan 50 twice daily Hyperlipidemia DM TY 2-patient reinitiation of an outpatient management Sundowning in setting of dementia-started Seroquel 12.5 at bedtime continue Effexor-also melatonin was started this admission DNR state Consultations:  Neurology  Discharge Exam: Vitals:   12/11/2017 0559 12/10/2017 0712  BP: (!) 190/91 (!) 150/88  Pulse: 92 92  Resp: 19 18  Temp:    SpO2: 94%     General: Awake alert pleasant no distress EOMI NCAT, slightly confused, slightly slow speech Cardiovascular: S1-S2 no murmur rub or gallop sinus rhythm Respiratory: Clinically clear no added sound Abdomen soft nontender no rebound Power is diminished in left upper extremity for finger squeeze and poor flexion and bicep Abdomen ranges of power 5/5 throughout other muscle groups no lower extremity edema  Discharge Instructions   Discharge Instructions    Ambulatory referral to Neurology   Complete by:  As directed    Follow up with stroke clinic NP Venancio Poisson  or Cecille Rubin, if both not available, consider Dr. Antony Contras, Dr. Bess Harvest, or Dr. Sarina Ill) at [facility] in about 4  weeks.  D/c date pending. May go to rehab prior to return home.   Diet - low sodium heart healthy   Complete by:  As directed    Increase activity slowly   Complete by:  As directed      Allergies as of 12/14/2017      Reactions   Morphine Nausea Only   Penicillins Other (See Comments)   Has patient had a PCN reaction causing immediate rash, facial/tongue/throat swelling, SOB or lightheadedness with hypotension: NO Has patient had a PCN reaction causing severe rash involving mucus membranes or skin necrosis: no Has patient had a PCN reaction that required hospitalization: NO Has patient had a PCN reaction occurring within the last 10 years: NO If all of the above answers are "NO", then may proceed with Cephalosporin use.      Medication List    STOP taking these medications   warfarin 5 MG tablet Commonly known as:  COUMADIN     TAKE these medications   acetaminophen 325 MG tablet Commonly known as:  TYLENOL Take 2 tablets (650 mg total) by mouth every 6 (six) hours as needed for mild pain (or temp > 37.5 C (99.5 F)).   diltiazem 240 MG 24 hr capsule Commonly known as:  CARDIZEM CD Take 240 mg by mouth every morning.   furosemide 40 MG tablet Commonly known as:  LASIX Take 1 tablet (40 mg total) by mouth daily. Start taking on:  12/17/2017 What changed:    medication strength  how much to take   glipiZIDE 10 MG 24 hr tablet Commonly known as:  GLUCOTROL XL Take 1 tablet by mouth every morning.   losartan 50 MG tablet Commonly known as:  COZAAR Take 1 tablet (50 mg total) by mouth 2 (two) times daily. In the morning   Melatonin 3 MG Tabs Take 1 tablet (3 mg total) by mouth at bedtime as needed (SLEEP).   metoprolol tartrate 50 MG tablet Commonly known as:  LOPRESSOR Take 50 mg by mouth 2 (two) times daily.   pantoprazole 40 MG tablet Commonly known as:  PROTONIX Take 1 tablet (40 mg total) by mouth daily. Start taking on:  12/17/2017   polyethylene  glycol packet Commonly known as:  MIRALAX / GLYCOLAX Take 17 g by mouth daily. Start taking on:  12/17/2017   pravastatin 20 MG tablet Commonly known as:  PRAVACHOL Take 1 tablet by mouth at bedtime.   QUEtiapine 25 MG tablet Commonly known as:  SEROQUEL Take 0.5 tablets (12.5 mg total) by mouth at bedtime.   RESOURCE THICKENUP CLEAR Powd Use prn liqiuids   venlafaxine 100 MG tablet Commonly known as:  EFFEXOR Take 100 mg by mouth daily.      Allergies  Allergen Reactions  . Morphine Nausea Only  . Penicillins Other (See Comments)    Has patient had a PCN reaction causing immediate rash, facial/tongue/throat swelling, SOB or lightheadedness with hypotension: NO Has patient had a PCN reaction causing severe rash involving mucus membranes or skin necrosis: no Has patient had a PCN reaction that required hospitalization: NO Has patient had a PCN reaction occurring within the last 10 years: NO If all of the above answers are "NO", then may proceed with Cephalosporin use.    Follow-up Information    [facility] Follow up in 4 week(s).  Specialty:  Neurology Why:  stroke clinic. office will call with attd date and time.  Contact information: [address] [place] [phone]           The results of significant diagnostics from this hospitalization (including imaging, microbiology, ancillary and laboratory) are listed below for reference.    Significant Diagnostic Studies: Ct Angio Head W Or Wo Contrast  Result Date: 12/11/2017 CLINICAL DATA:  Follow-up examination for known intracranial hemorrhage. Patient with right lentiform intraparenchymal hematoma. EXAM: CT ANGIOGRAPHY HEAD AND NECK TECHNIQUE: Multidetector CT imaging of the head and neck was performed using the standard protocol during bolus administration of intravenous contrast. Multiplanar CT image reconstructions and MIPs were obtained to evaluate the  vascular anatomy. Carotid stenosis measurements (when applicable) are obtained utilizing NASCET criteria, using the distal internal carotid diameter as the denominator. CONTRAST:  45m ISOVUE-370 IOPAMIDOL (ISOVUE-370) INJECTION 76% COMPARISON:  Prior CT from 12/10/2017 as well as earlier studies. FINDINGS: CTA NECK FINDINGS Aortic arch: Visualized aortic arch of normal caliber with normal branch pattern. Moderate atherosclerotic change about the aortic arch and origin of the great vessels without hemodynamically significant stenosis. Visualized subclavian arteries widely patent. Right carotid system: Scattered non stenotic plaque within the right common carotid artery without significant narrowing. Concentric calcified plaque about the right bifurcation/proximal right ICA without hemodynamically significant stenosis. Probable remote changes related to prior endarterectomy noted. Right ICA patent from the bifurcation to the skull base without stenosis, dissection, or occlusion. Left carotid system: Left common carotid artery patent from its origin to the bifurcation without significant stenosis. Left common carotid artery medialized into the retropharyngeal space. Scattered a centric calcified plaque about the left bifurcation/proximal left ICA with associated stenosis of up to approximately 50% by NASCET criteria. Left ICA widely patent distally to the skull base without stenosis, dissection, or occlusion. Vertebral arteries: Both of the vertebral arteries arise from the subclavian arteries. The left vertebral artery appears to be slightly dominant. Left vertebral artery essentially occludes just beyond its origin, and remains occluded to approximately the level of C4-5. Distal reconstitution likely via muscular branches. Irregular attenuated flow seen distally within the left vertebral artery which is otherwise patent to the skull base. The right vertebral artery is occluded at its origin. Irregular distal  reconstitution at approximately the level of C5-6 (series 8, image 231). Irregular thready and attenuated flow seen distally throughout the right ICA with multifocal moderate to severe segmental stenoses. Right vertebral artery is nearly occluded as it reaches the cranial vault (series 8, image 165). Skeleton: No acute osseus abnormality. No discrete lytic or blastic osseous lesions. Moderate cervical spondylolysis noted at C4-5 through C6-7. Other neck: No acute soft tissue abnormality within the neck. Chronic left maxillary sinusitis noted. Salivary glands within normal limits. No adenopathy. Thyroid within normal limits. Upper chest: Streak artifact from left-sided pacemaker/AICD. Left-sided pleural effusion with associated atelectasis partially visualized. Additional scattered atelectatic changes noted within the visualized lungs. Review of the MIP images confirms the above findings CTA HEAD FINDINGS Anterior circulation: Petrous segments widely patent bilaterally. Advanced atheromatous plaque within the cavernous/supraclinoid ICAs with moderate multifocal narrowing. ICA termini widely patent. Left A1 segment irregular but patent without high-grade stenosis. Severe diffuse stenosis of the right A1 segment. Normal anterior communicating artery. Extensive atheromatous irregularity throughout the ACAs, left greater than right were there are multifocal moderate to severe stenoses. Left M1 irregular but widely patent without high-grade stenosis. Focal moderate proximal right M1 stenosis noted (series  8, image 92). Normal MCA bifurcations. No proximal M2 occlusion. Extensive small vessel atheromatous irregularity throughout the MCA branches bilaterally which are well perfused and fairly symmetric. Posterior circulation: Multifocal atheromatous irregularity within the dominant left V4 segment which is patent to the vertebrobasilar junction without high-grade stenosis. Patent left PICA. Diminutive right vertebral artery  nearly occluded at the skull base, with scant irregular flow seen within the right V4 segment. Right V4 occludes prior to the vertebrobasilar junction. Right PICA of not seen. Basilar artery somewhat diminutive. Severe atheromatous change throughout the mid and distal basilar artery with multifocal severe stenoses. Anterior inferior cerebral arteries patent bilaterally. Superior cerebral arteries grossly patent. Predominant fetal type origin of the PCAs with patent and robust posterior communicating arteries. PCAs are patent to their distal aspects with multifocal moderate to severe distal P3 and P4 stenoses. Venous sinuses: Not well assessed due to arterial timing of the contrast bolus. Anatomic variants: Fetal type origin of the PCAs with underlying diminutive vertebrobasilar system. Focal outpouching at the cavernous left ICA in the region of the left hypophyseal artery favored to reflect a normal vascular infundibulum. No AVM or other vascular abnormality seen underlying the right lentiform hemorrhage. Delayed phase: No abnormal enhancement. Right lentiform nucleus hemorrhage relatively stable measuring 13 x 14 mm. No intraventricular extension. Review of the MIP images confirms the above findings IMPRESSION: 1. No significant interval change in size and appearance right lenticular hematoma. No underlying vascular abnormality identified. 2. Severe vertebrobasilar atherosclerotic disease as above. Proximal-mid vertebral arteries are occluded within the neck with distal reconstitution. Right vertebral subsequently occludes at the skull base. Multifocal severe stenoses seen throughout the basilar artery. Overall, vertebrobasilar system is diminutive with fetal type origin of the PCAs. 3. Approximate 50% atheromatous stenosis at the left carotid bifurcation. 4. Sequelae of remote right carotid endarterectomy, with no significant residual or recurrent stenosis. 5. Additional extensive atherosclerotic change  throughout the intracranial circulation as above. No large vessel occlusion. Electronically Signed   By: Jeannine Boga M.D.   On: 12/11/2017 17:12   Dg Chest 2 View  Result Date: 11/24/2017 CLINICAL DATA:  Headache EXAM: CHEST - 2 VIEW COMPARISON:  February 07, 2016 FINDINGS: The heart size and mediastinal contours are stable. The heart size is enlarged. Cardiac pacemaker is unchanged. There is a small left pleural effusion. No focal pneumonia or pulmonary edema is identified. The visualized skeletal structures are stable. IMPRESSION: Small left pleural effusion.  Cardiomegaly. Electronically Signed   By: Abelardo Diesel M.D.   On: 11/24/2017 18:25   Dg Pelvis 1-2 Views  Result Date: 11/24/2017 CLINICAL DATA:  Fall EXAM: PELVIS - 1-2 VIEW COMPARISON:  Pelvic radiograph 04/20/2014 FINDINGS: There are old fractures of the right superior and inferior pubic rami. No acute fracture. Both hips are approximated. IMPRESSION: No acute abnormality Electronically Signed   By: Ulyses Jarred M.D.   On: 11/24/2017 18:27   Ct Head Wo Contrast  Result Date: 12/10/2017 CLINICAL DATA:  Intracerebral hemorrhage follow-up EXAM: CT HEAD WITHOUT CONTRAST TECHNIQUE: Contiguous axial images were obtained from the base of the skull through the vertex without intravenous contrast. COMPARISON:  CT head 12/10/2017 FINDINGS: Brain: Right posterior basal ganglia and deep white matter hematoma is unchanged in size measuring 13 x 13 by 26 mm. No intraventricular extension. No mass-effect or midline shift. Moderate atrophy. Ventricular enlargement due to atrophy is unchanged. Extensive chronic microvascular ischemic changes in the white matter. Densely calcified mass along the right tentorium projecting inferiorly is unchanged  measuring 12 mm compatible with meningioma. Vascular: Extensive atherosclerotic calcification. Negative for hyperdense vessel Skull: Negative Sinuses/Orbits: Extensive mucosal edema left maxillary ethmoid  and frontal sinus unchanged. Calcified secretions left ethmoid sinus. Other: None IMPRESSION: Right lenticular hematoma unchanged from earlier today. No intraventricular hemorrhage or midline shift. Electronically Signed   By: Franchot Gallo M.D.   On: 12/10/2017 19:19   Ct Head Wo Contrast  Result Date: 11/24/2017 CLINICAL DATA:  Fall with headache EXAM: CT HEAD WITHOUT CONTRAST CT CERVICAL SPINE WITHOUT CONTRAST TECHNIQUE: Multidetector CT imaging of the head and cervical spine was performed following the standard protocol without intravenous contrast. Multiplanar CT image reconstructions of the cervical spine were also generated. COMPARISON:  Head CT 05/20/2016 FINDINGS: CT HEAD FINDINGS Brain: There is no mass, hemorrhage or extra-axial collection. There is generalized atrophy without lobar predilection. There is hypoattenuation of the periventricular white matter, most commonly indicating chronic ischemic microangiopathy. Old left basal ganglia lacunar infarct. Vascular: Atherosclerotic calcification of the internal carotid arteries at the skull base. No abnormal hyperdensity of the major intracranial arteries or dural venous sinuses. Skull: Large left frontal scalp hematoma.  No underlying fracture. Sinuses/Orbits: No fluid levels or advanced mucosal thickening of the visualized paranasal sinuses. No mastoid or middle ear effusion. The orbits are normal. CT CERVICAL SPINE FINDINGS Alignment: No static subluxation. Facets are aligned. Occipital condyles are normally positioned. Skull base and vertebrae: No acute fracture. Soft tissues and spinal canal: No prevertebral fluid or swelling. No visible canal hematoma. Disc levels: Multilevel degenerative disc disease and mild facet hypertrophy. No bony spinal canal stenosis. Upper chest: No pneumothorax, pulmonary nodule or pleural effusion. Other: Normal visualized paraspinal cervical soft tissues. IMPRESSION: 1. Left frontal scalp hematoma without calvarial  fracture or acute intracranial abnormality. 2. Chronic ischemic microangiopathy and generalized volume loss. 3. No acute abnormality of the cervical spine. Electronically Signed   By: Ulyses Jarred M.D.   On: 11/24/2017 18:39   Ct Angio Neck W Or Wo Contrast  Result Date: 12/11/2017 CLINICAL DATA:  Follow-up examination for known intracranial hemorrhage. Patient with right lentiform intraparenchymal hematoma. EXAM: CT ANGIOGRAPHY HEAD AND NECK TECHNIQUE: Multidetector CT imaging of the head and neck was performed using the standard protocol during bolus administration of intravenous contrast. Multiplanar CT image reconstructions and MIPs were obtained to evaluate the vascular anatomy. Carotid stenosis measurements (when applicable) are obtained utilizing NASCET criteria, using the distal internal carotid diameter as the denominator. CONTRAST:  25m ISOVUE-370 IOPAMIDOL (ISOVUE-370) INJECTION 76% COMPARISON:  Prior CT from 12/10/2017 as well as earlier studies. FINDINGS: CTA NECK FINDINGS Aortic arch: Visualized aortic arch of normal caliber with normal branch pattern. Moderate atherosclerotic change about the aortic arch and origin of the great vessels without hemodynamically significant stenosis. Visualized subclavian arteries widely patent. Right carotid system: Scattered non stenotic plaque within the right common carotid artery without significant narrowing. Concentric calcified plaque about the right bifurcation/proximal right ICA without hemodynamically significant stenosis. Probable remote changes related to prior endarterectomy noted. Right ICA patent from the bifurcation to the skull base without stenosis, dissection, or occlusion. Left carotid system: Left common carotid artery patent from its origin to the bifurcation without significant stenosis. Left common carotid artery medialized into the retropharyngeal space. Scattered a centric calcified plaque about the left bifurcation/proximal left ICA  with associated stenosis of up to approximately 50% by NASCET criteria. Left ICA widely patent distally to the skull base without stenosis, dissection, or occlusion. Vertebral arteries: Both of the vertebral arteries arise  from the subclavian arteries. The left vertebral artery appears to be slightly dominant. Left vertebral artery essentially occludes just beyond its origin, and remains occluded to approximately the level of C4-5. Distal reconstitution likely via muscular branches. Irregular attenuated flow seen distally within the left vertebral artery which is otherwise patent to the skull base. The right vertebral artery is occluded at its origin. Irregular distal reconstitution at approximately the level of C5-6 (series 8, image 231). Irregular thready and attenuated flow seen distally throughout the right ICA with multifocal moderate to severe segmental stenoses. Right vertebral artery is nearly occluded as it reaches the cranial vault (series 8, image 165). Skeleton: No acute osseus abnormality. No discrete lytic or blastic osseous lesions. Moderate cervical spondylolysis noted at C4-5 through C6-7. Other neck: No acute soft tissue abnormality within the neck. Chronic left maxillary sinusitis noted. Salivary glands within normal limits. No adenopathy. Thyroid within normal limits. Upper chest: Streak artifact from left-sided pacemaker/AICD. Left-sided pleural effusion with associated atelectasis partially visualized. Additional scattered atelectatic changes noted within the visualized lungs. Review of the MIP images confirms the above findings CTA HEAD FINDINGS Anterior circulation: Petrous segments widely patent bilaterally. Advanced atheromatous plaque within the cavernous/supraclinoid ICAs with moderate multifocal narrowing. ICA termini widely patent. Left A1 segment irregular but patent without high-grade stenosis. Severe diffuse stenosis of the right A1 segment. Normal anterior communicating artery.  Extensive atheromatous irregularity throughout the ACAs, left greater than right were there are multifocal moderate to severe stenoses. Left M1 irregular but widely patent without high-grade stenosis. Focal moderate proximal right M1 stenosis noted (series 8, image 92). Normal MCA bifurcations. No proximal M2 occlusion. Extensive small vessel atheromatous irregularity throughout the MCA branches bilaterally which are well perfused and fairly symmetric. Posterior circulation: Multifocal atheromatous irregularity within the dominant left V4 segment which is patent to the vertebrobasilar junction without high-grade stenosis. Patent left PICA. Diminutive right vertebral artery nearly occluded at the skull base, with scant irregular flow seen within the right V4 segment. Right V4 occludes prior to the vertebrobasilar junction. Right PICA of not seen. Basilar artery somewhat diminutive. Severe atheromatous change throughout the mid and distal basilar artery with multifocal severe stenoses. Anterior inferior cerebral arteries patent bilaterally. Superior cerebral arteries grossly patent. Predominant fetal type origin of the PCAs with patent and robust posterior communicating arteries. PCAs are patent to their distal aspects with multifocal moderate to severe distal P3 and P4 stenoses. Venous sinuses: Not well assessed due to arterial timing of the contrast bolus. Anatomic variants: Fetal type origin of the PCAs with underlying diminutive vertebrobasilar system. Focal outpouching at the cavernous left ICA in the region of the left hypophyseal artery favored to reflect a normal vascular infundibulum. No AVM or other vascular abnormality seen underlying the right lentiform hemorrhage. Delayed phase: No abnormal enhancement. Right lentiform nucleus hemorrhage relatively stable measuring 13 x 14 mm. No intraventricular extension. Review of the MIP images confirms the above findings IMPRESSION: 1. No significant interval change  in size and appearance right lenticular hematoma. No underlying vascular abnormality identified. 2. Severe vertebrobasilar atherosclerotic disease as above. Proximal-mid vertebral arteries are occluded within the neck with distal reconstitution. Right vertebral subsequently occludes at the skull base. Multifocal severe stenoses seen throughout the basilar artery. Overall, vertebrobasilar system is diminutive with fetal type origin of the PCAs. 3. Approximate 50% atheromatous stenosis at the left carotid bifurcation. 4. Sequelae of remote right carotid endarterectomy, with no significant residual or recurrent stenosis. 5. Additional extensive atherosclerotic change throughout the  intracranial circulation as above. No large vessel occlusion. Electronically Signed   By: Jeannine Boga M.D.   On: 12/11/2017 17:12   Ct Chest Wo Contrast  Result Date: 12/13/2017 CLINICAL DATA:  Rales on auscultation. EXAM: CT CHEST WITHOUT CONTRAST TECHNIQUE: Multidetector CT imaging of the chest was performed following the standard protocol without IV contrast. COMPARISON:  08/19/2011. Portable chest obtained earlier today. FINDINGS: Cardiovascular: Enlarged heart due to left atrial and left ventricular enlargement. Atheromatous calcifications, including the coronary arteries and aorta. Small pericardial effusion with a maximum thickness of 8 mm. Left subclavian pacemaker leads in satisfactory position. Mediastinum/Nodes: No enlarged mediastinal or axillary lymph nodes. Thyroid gland, trachea, and esophagus demonstrate no significant findings. Lungs/Pleura: Small bilateral pleural effusions. Mild bilateral dependent atelectasis. Prominent pulmonary vasculature and interstitial markings. Upper Abdomen: Dense retained barium in the colon with associated streak artifacts. Musculoskeletal: Old, healed right rib fractures. Thoracic and lower cervical spine degenerative changes. IMPRESSION: 1. Changes of acute congestive heart  failure with small bilateral pleural effusions and bilateral dependent atelectasis. 2.  Calcific coronary artery and aortic atherosclerosis. Aortic Atherosclerosis (ICD10-I70.0). Electronically Signed   By: Claudie Revering M.D.   On: 12/13/2017 17:02   Ct Cervical Spine Wo Contrast  Result Date: 11/24/2017 CLINICAL DATA:  Fall with headache EXAM: CT HEAD WITHOUT CONTRAST CT CERVICAL SPINE WITHOUT CONTRAST TECHNIQUE: Multidetector CT imaging of the head and cervical spine was performed following the standard protocol without intravenous contrast. Multiplanar CT image reconstructions of the cervical spine were also generated. COMPARISON:  Head CT 05/20/2016 FINDINGS: CT HEAD FINDINGS Brain: There is no mass, hemorrhage or extra-axial collection. There is generalized atrophy without lobar predilection. There is hypoattenuation of the periventricular white matter, most commonly indicating chronic ischemic microangiopathy. Old left basal ganglia lacunar infarct. Vascular: Atherosclerotic calcification of the internal carotid arteries at the skull base. No abnormal hyperdensity of the major intracranial arteries or dural venous sinuses. Skull: Large left frontal scalp hematoma.  No underlying fracture. Sinuses/Orbits: No fluid levels or advanced mucosal thickening of the visualized paranasal sinuses. No mastoid or middle ear effusion. The orbits are normal. CT CERVICAL SPINE FINDINGS Alignment: No static subluxation. Facets are aligned. Occipital condyles are normally positioned. Skull base and vertebrae: No acute fracture. Soft tissues and spinal canal: No prevertebral fluid or swelling. No visible canal hematoma. Disc levels: Multilevel degenerative disc disease and mild facet hypertrophy. No bony spinal canal stenosis. Upper chest: No pneumothorax, pulmonary nodule or pleural effusion. Other: Normal visualized paraspinal cervical soft tissues. IMPRESSION: 1. Left frontal scalp hematoma without calvarial fracture or  acute intracranial abnormality. 2. Chronic ischemic microangiopathy and generalized volume loss. 3. No acute abnormality of the cervical spine. Electronically Signed   By: Ulyses Jarred M.D.   On: 11/24/2017 18:39   Dg Chest Port 1 View  Result Date: 12/14/2017 CLINICAL DATA:  Pleural effusions and atelectasis EXAM: PORTABLE CHEST 1 VIEW COMPARISON:  12/13/2017 FINDINGS: Stable cardiomegaly. Improvement in the perihilar and basilar edema pattern with residual pleural effusions and compressive basilar atelectasis, worse on the left. Upper lobes remain clear. No pneumothorax. Trachea is midline. Aorta is atherosclerotic. Left subclavian pacer noted. IMPRESSION: Improving CHF pattern with persistent cardiomegaly. Residual bilateral pleural effusions and basilar atelectasis/consolidation worse on the left. Electronically Signed   By: Jerilynn Mages.  Shick M.D.   On: 12/14/2017 08:15   Dg Chest Port 1 View  Result Date: 12/13/2017 CLINICAL DATA:  Shortness of breath EXAM: PORTABLE CHEST 1 VIEW COMPARISON:  12/11/2017 FINDINGS: Left chest  wall pacemaker is unchanged. There is moderate cardiomegaly with calcific aortic atherosclerosis. Consolidation at the left lung base with small pleural effusion has slightly worsened from the prior study. There is worsened aeration of the right lower lobe. IMPRESSION: 1. Worsening left basilar consolidation and small left pleural effusion. 2. Worsening aeration of the right lower lobe. Electronically Signed   By: Ulyses Jarred M.D.   On: 12/13/2017 03:26   Dg Chest Port 1 View  Result Date: 12/11/2017 CLINICAL DATA:  Shortness of Breath EXAM: PORTABLE CHEST 1 VIEW COMPARISON:  December 10, 2017 FINDINGS: There is airspace opacity in the left lower lobe with left pleural effusion. The right lung is clear. There is cardiomegaly with pulmonary vascularity normal. There is aortic atherosclerosis. Pacemaker leads are attached to the right atrium and right ventricle. Bones appear  osteoporotic. There are surgical clips in the lower right neck region. IMPRESSION: Consolidation left lower lobe with small left pleural effusion. Right lung clear. There is stable cardiomegaly with aortic atherosclerosis. Pacemaker leads attached to right atrium and right ventricle. Bones osteoporotic. Aortic Atherosclerosis (ICD10-I70.0). Electronically Signed   By: Lowella Grip III M.D.   On: 12/11/2017 07:58   Dg Chest Portable 1 View  Result Date: 12/10/2017 CLINICAL DATA:  Shortness of breath.  Code stroke. EXAM: PORTABLE CHEST 1 VIEW COMPARISON:  11/24/2017. FINDINGS: Cardiac pacer noted with lead tips in right atrium right ventricle. Cardiomegaly with diffuse bilateral from interstitial prominence and left-sided pleural effusion. Findings consistent CHF. No pneumothorax. No acute bony abnormality IMPRESSION: Cardiac pacer with lead tips over the right atrium right ventricle. Cardiomegaly with diffuse bilateral from interstitial prominence and small left pleural effusion consistent with CHF. Electronically Signed   By: Marcello Moores  Register   On: 12/10/2017 08:26   Dg Swallowing Func-speech Pathology  Result Date: 12/11/2017 Objective Swallowing Evaluation: Type of Study: MBS-Modified Barium Swallow Study  Patient Details Name: ALLORA BAINS MRN: [id number] Date of Birth: [birth year] Today's Date: 12/11/2017 Time: SLP Start Time (ACUTE ONLY): 4098 -SLP Stop Time (ACUTE ONLY): 1191 SLP Time Calculation (min) (ACUTE ONLY): 25 min Past Medical History: Past Medical History: Diagnosis Date . Arthritis  . Atrial fibrillation ([place])  . Carotid artery disease ([place])   Right carotid endarectomy 1999 . Chronic anticoagulation   Followed by PMD . Coronary atherosclerosis   a. Minor at cardiac catheterization 2005 b. cath 10/16/2014 40% prox LAD dx, otherwise minimal CAD . Depression  . Essential hypertension  . Glucose intolerance (impaired glucose tolerance)  . History of kidney stones  . Hyperlipidemia  .  Pneumonia 2010 . Sick sinus syndrome ([facility])   [employer] PPM Past Surgical History: Past Surgical History: Procedure Laterality Date . ABDOMINAL HYSTERECTOMY   . APPENDECTOMY   . BREAST BIOPSY Bilateral   x 7 total . CARDIAC CATHETERIZATION N/A 10/16/2014  Procedure: Left Heart Cath and Coronary Angiography;  Surgeon: Leonie Man, MD;  Location: [facility];  Service: Cardiovascular;  Laterality: N/A; . CAROTID ENDARTERECTOMY Right 1999 . CATARACT EXTRACTION W/ INTRAOCULAR LENS  IMPLANT, BILATERAL Bilateral  . COLONOSCOPY    2006 . CYSTOSCOPY W/ URETERAL STENT PLACEMENT Left 04/19/2012  Procedure: CYSTOSCOPY WITH RETROGRADE PYELOGRAM/URETERAL STENT PLACEMENT ;  Surgeon: Ailene Rud, MD;  Location: [facility];  Service: Urology;  Laterality: Left; . CYSTOSCOPY WITH RETROGRADE PYELOGRAM, URETEROSCOPY AND STENT PLACEMENT Left 06/30/2012  Procedure: CYSTOSCOPY WITH LEFT  RETROGRADE PYELOGRAM, URETEROSCOPY  with basketing of stone, AND STENT PLACEMENT, TRANSURETHRAL UNROOFING OF URETER.;  Surgeon: Alexis Frock,  MD;  Location: [facility];  Service: Urology;  Laterality: Left; . INSERT / REPLACE / REMOVE PACEMAKER  2006 . OPEN REDUCTION INTERNAL FIXATION (ORIF) DISTAL RADIAL FRACTURE Right 04/29/2016  Procedure: OPEN REDUCTION INTERNAL FIXATION (ORIF) DISTAL RADIAL FRACTURE;  Surgeon: Roseanne Kaufman, MD;  Location: [facility];  Service: Orthopedics;  Laterality: Right; . ORIF DISTAL RADIUS FRACTURE Right 04/29/2016 HPI: Pt is an 83 y.o. female admitted with L facial droop. CT showed a R lentiform hemorrhage with mild surrounding edema. PMH includes: dementia (living in ILF), sick sinus syndrome, hyperlipidemia, essential hypertension, CAD, chronic anticoagulation, atrial fibrillation, carotid artery disease, PNA  Subjective: pt alert, pleasant, but confused Assessment / Plan / Recommendation CHL IP CLINICAL IMPRESSIONS 12/11/2017 Clinical Impression Pt has a moderate oral and mild pharyngeal dysphagia with sensorimotor  deficits. Orally she has weak labial seal and lingual manipulation, allowing premature spillage with thin liquids, saliva, and even a large piece of unmasticated cracker without pt awareness. She has mild-moderate L buccal pocketing, but can attend to this residue when given Min-Mod cues for lingual sweep. Her timing is mildly impaired for swallow trigger with thin liquids, likely at least in part related to decreased oral containement and/or decreased sensation, although no aspiration occurs. She consistently penetrates, with penetrates getting close to the true vocal folds, but penetrates clear the laryngeal vestibule upon completion of the swallow. Nectar thick liquids were also tested with no penetration observed. Recommend to start wtih Dys 1 diet and thin liquids with full supervision. Should pt experience any overt difficulty during meals, nectar thick liquids could be considered as a safer alternative. SLP will continue to follow for tolerance and readiness to advance. SLP Visit Diagnosis Dysphagia, oropharyngeal phase (R13.12) Attention and concentration deficit following -- Frontal lobe and executive function deficit following -- Impact on safety and function Mild aspiration risk;Moderate aspiration risk   CHL IP TREATMENT RECOMMENDATION 12/11/2017 Treatment Recommendations Therapy as outlined in treatment plan below   Prognosis 12/11/2017 Prognosis for Safe Diet Advancement Good Barriers to Reach Goals Cognitive deficits Barriers/Prognosis Comment -- CHL IP DIET RECOMMENDATION 12/11/2017 SLP Diet Recommendations Dysphagia 1 (Puree) solids;Thin liquid Liquid Administration via Cup;Straw Medication Administration Crushed with puree Compensations Slow rate;Small sips/bites;Minimize environmental distractions;Lingual sweep for clearance of pocketing;Monitor for anterior loss Postural Changes Seated upright at 90 degrees   CHL IP OTHER RECOMMENDATIONS 12/11/2017 Recommended Consults -- Oral Care  Recommendations Oral care BID Other Recommendations Have oral suction available   CHL IP FOLLOW UP RECOMMENDATIONS 12/11/2017 Follow up Recommendations (No Data)   CHL IP FREQUENCY AND DURATION 12/11/2017 Speech Therapy Frequency (ACUTE ONLY) min 2x/week Treatment Duration 2 weeks      CHL IP ORAL PHASE 12/11/2017 Oral Phase Impaired Oral - Pudding Teaspoon -- Oral - Pudding Cup -- Oral - Honey Teaspoon -- Oral - Honey Cup -- Oral - Nectar Teaspoon -- Oral - Nectar Cup -- Oral - Nectar Straw Weak lingual manipulation;Reduced posterior propulsion Oral - Thin Teaspoon -- Oral - Thin Cup Weak lingual manipulation;Reduced posterior propulsion;Left anterior bolus loss Oral - Thin Straw Weak lingual manipulation;Reduced posterior propulsion Oral - Puree Weak lingual manipulation;Reduced posterior propulsion;Left pocketing in lateral sulci Oral - Mech Soft Weak lingual manipulation;Reduced posterior propulsion;Left pocketing in lateral sulci;Left anterior bolus loss Oral - Regular -- Oral - Multi-Consistency -- Oral - Pill -- Oral Phase - Comment --  CHL IP PHARYNGEAL PHASE 12/11/2017 Pharyngeal Phase Impaired Pharyngeal- Pudding Teaspoon -- Pharyngeal -- Pharyngeal- Pudding Cup -- Pharyngeal -- Pharyngeal- Honey Teaspoon -- Pharyngeal --  Pharyngeal- Honey Cup -- Pharyngeal -- Pharyngeal- Nectar Teaspoon -- Pharyngeal -- Pharyngeal- Nectar Cup -- Pharyngeal -- Pharyngeal- Nectar Straw WFL Pharyngeal -- Pharyngeal- Thin Teaspoon -- Pharyngeal -- Pharyngeal- Thin Cup Penetration/Aspiration before swallow Pharyngeal Material enters airway, remains ABOVE vocal cords and not ejected out Pharyngeal- Thin Straw Penetration/Aspiration before swallow Pharyngeal Material enters airway, remains ABOVE vocal cords and not ejected out Pharyngeal- Puree WFL Pharyngeal -- Pharyngeal- Mechanical Soft WFL Pharyngeal -- Pharyngeal- Regular -- Pharyngeal -- Pharyngeal- Multi-consistency -- Pharyngeal -- Pharyngeal- Pill -- Pharyngeal --  Pharyngeal Comment --  CHL IP CERVICAL ESOPHAGEAL PHASE 12/11/2017 Cervical Esophageal Phase WFL Pudding Teaspoon -- Pudding Cup -- Honey Teaspoon -- Honey Cup -- Nectar Teaspoon -- Nectar Cup -- Nectar Straw -- Thin Teaspoon -- Thin Cup -- Thin Straw -- Puree -- Mechanical Soft -- Regular -- Multi-consistency -- Pill -- Cervical Esophageal Comment -- Germain Osgood 12/11/2017, 5:30 PM  Germain Osgood, M.A. CCC-SLP Acute Rehabilitation Services Pager [phone] Office [phone]             Ct Head Code Stroke Wo Contrast  Result Date: 12/10/2017 CLINICAL DATA:  Code stroke. 83 year old female with sudden onset left side weakness. EXAM: CT HEAD WITHOUT CONTRAST TECHNIQUE: Contiguous axial images were obtained from the base of the skull through the vertex without intravenous contrast. COMPARISON:  Head CT without contrast 11/24/2017 and earlier. FINDINGS: Brain: There is an oval or diamond-shaped hyperdense hemorrhage in the right lentiform and Corona radiata encompassing 19 x 17 x 25 millimeters (AP by transverse by CC) for an estimated blood volume of 4 milliliters. Mild surrounding edema. Minor regional mass effect. No extension into the ventricles or outside of the brain parenchyma. Superimposed confluent bilateral cerebral white matter hypodensity and deep gray matter heterogeneity. Cavum septum pellucidum, normal variant. No acute cortically based infarct identified. 12-13 millimeter calcified right tentorial meningioma re-demonstrated, and was 10 millimeters in 2012. Vascular: Calcified atherosclerosis at the skull base. No suspicious intracranial vascular hyperdensity. The Skull: Stable and intact. Sinuses/Orbits: Continued left OMC obstructive pattern sinus disease. Stable sinus and mastoid aeration. Other: Partially resolved left scalp hematoma since October. No new scalp or orbits soft tissue abnormality. ASPECTS [facility] Stroke Program Early CT Score) Total score (0-10 with 10 being  normal): Not applicable, acute hemorrhage. IMPRESSION: 1. Small acute right lentiform hemorrhage with estimated blood volume of 4 mL. Mild surrounding edema. No ventricular or extra-axial extension. 2. Critical Value/emergent results were called by telephone at the time of interpretation on 12/10/2017 at 7:03 am to Dr. Ripley Fraise , who verbally acknowledged these results. 3. Underlying chronic small vessel disease. Partially resolved left scalp hematoma since October. Small chronic right tentorial meningioma. Electronically Signed   By: Genevie Ann M.D.   On: 12/10/2017 07:04    Microbiology: Recent Results (from the past 240 hour(s))  MRSA PCR Screening     Status: None   Collection Time: 12/10/17  9:56 AM  Result Value Ref Range Status   MRSA by PCR NEGATIVE NEGATIVE Final    Comment:        The GeneXpert MRSA Assay (FDA approved for NASAL specimens only), is one component of a comprehensive MRSA colonization surveillance program. It is not intended to diagnose MRSA infection nor to guide or monitor treatment for MRSA infections. Performed at [facility], [place] [address], [place], [place] [address]      Labs: Basic Metabolic Panel: Recent Labs  Lab 12/10/17 [phone] 12/12/17 0305 12/13/17 0344 12/14/17 0454 12/15/17  0520 12/26/2017 0426  NA 139 139 141 140 140 140  K 3.6 3.6 3.3* 4.2 3.0* 3.4*  CL 105 107 108 108 105 102  CO2 '27 24 25 26 27 27  ' GLUCOSE 170* 184* 131* 136* 164* 173*  BUN '16 16 12 13 14 20  ' CREATININE 0.94 0.90 0.83 0.91 0.98 1.02*  CALCIUM 8.7* 9.0 9.0 8.7* 9.1 9.5  MG 1.8  --   --   --  1.8  --    Liver Function Tests: Recent Labs  Lab 12/10/17 0701  AST 21  ALT 17  ALKPHOS 86  BILITOT 0.7  PROT 7.5  ALBUMIN 3.6   No results for input(s): LIPASE, AMYLASE in the last 168 hours. No results for input(s): AMMONIA in the last 168 hours. CBC: Recent Labs  Lab 12/10/17 0701 12/12/17 0305 12/13/17 0344 12/14/17 0454 12/15/17 0520  12/09/2017 0426  WBC 10.5 13.2* 12.3* 10.9* 11.1* 13.1*  NEUTROABS 6.4  --   --  7.4  --   --   HGB 14.9 12.4 11.7* 11.7* 12.2 12.5  HCT 45.1 39.1 36.4 36.5 37.1 38.6  MCV 95.6 97.8 97.1 99.5 96.9 97.2  PLT 280 260 230 179 234 234   Cardiac Enzymes: No results for input(s): CKTOTAL, CKMB, CKMBINDEX, TROPONINI in the last 168 hours. BNP: BNP (last 3 results) No results for input(s): BNP in the last 8760 hours.  ProBNP (last 3 results) No results for input(s): PROBNP in the last 8760 hours.  CBG: Recent Labs  Lab 12/15/17 1617 12/15/17 2014 12/15/2017 0003 12/02/2017 0424 12/05/2017 0740  GLUCAP 238* 233* 195* 172* 167*       Signed:  Nita Sells MD   [facility] 12/15/2017, 11:02 AM

## 2017-12-16 NOTE — Clinical Social Work Note (Signed)
Clinical Social Work Assessment  Patient Details  Name: Traci [name] MRN: [id number] Date of Birth: [birth year]  Date of referral:  11/27/2017               Reason for consult:  Discharge Planning                Permission sought to share information with:  Case Manager, Family Supports Permission granted to share information::  Yes, Verbal Permission Granted  Name::     Sports coach::  SNF  Relationship::  Daughter   Contact Information:  [place]   Housing/Transportation Living arrangements for the past 2 months:  [place] of Information:  Patient, Medical Team, Adult Children Patient Interpreter Needed:  None Criminal Activity/Legal Involvement Pertinent to Current Situation/Hospitalization:  No - Comment as needed Significant Relationships:  Adult Children Lives with:  Self Do you feel safe going back to the place where you live?  Yes Need for family participation in patient care:  Yes (Comment)  Care giving concerns:  Patient will be discharged to SNF and will be going to the [facility] in [place].    Social Worker assessment / plan:  CSW met with the family at bedside. CSW spoke with the family that the patient may have a daily co-pay of 10 or 20 dollars per day. CSW advised the family to double check with the insurance to make sure they knew the specific amount. Dr. Verlon Au approved the discharge and CSW coordinated with the [facility] will be picking up the patient at 1:00pm and the patient will be discharged.   Employment status:  Retired Nurse, adult PT Recommendations:  [place] / Referral to community resources:  [place]  Patient/Family's Response to care:  Family agreed that the patient will be discharged to the [employer]. Family will be supportive in recovery.   Patient/Family's Understanding of and Emotional Response to Diagnosis, Current Treatment, and  Prognosis: Patient's daughter is understanding to what is going on with her mother. She is supportive of the patient going to the [facility].   Emotional Assessment Appearance:  Appears stated age Attitude/Demeanor/Rapport:  Unable to Assess Affect (typically observed):  Unable to Assess Orientation:  Oriented to Self, Oriented to Place Alcohol / Substance use:  Not Applicable Psych involvement (Current and /or in the community):  No (Comment)  Discharge Needs  Concerns to be addressed:  Discharge Planning Concerns Readmission within the last 30 days:  No Current discharge risk:  Lives alone, Dependent with Mobility, Cognitively Impaired Barriers to Discharge:  No Barriers Identified   Aeryn Medici B Jonalyn Sedlak, LCSWA 12/04/2017, 11:31 AM

## 2017-12-16 NOTE — Progress Notes (Signed)
CSW received alert from medical team that family wants SNF, preference for [facility]. CSW faxed out referral, confirmed availability at [facility] for the patient. CSW contacted [employer] to initiate insurance authorization request.  CSW to follow.  Laveda Abbe, [place] Clinical Social Worker [phone]

## 2017-12-17 ENCOUNTER — Non-Acute Institutional Stay (SKILLED_NURSING_FACILITY): Payer: PPO | Admitting: Internal Medicine

## 2017-12-17 ENCOUNTER — Encounter: Payer: Self-pay | Admitting: Internal Medicine

## 2017-12-17 ENCOUNTER — Encounter (HOSPITAL_COMMUNITY)
Admission: RE | Admit: 2017-12-17 | Discharge: 2017-12-17 | Disposition: A | Discharge status: Home / Self Care | Payer: PPO | Source: Skilled Nursing Facility | Attending: Internal Medicine | Admitting: Internal Medicine

## 2017-12-17 DIAGNOSIS — I10 Essential (primary) hypertension: Secondary | ICD-10-CM | POA: Diagnosis not present

## 2017-12-17 DIAGNOSIS — I4891 Unspecified atrial fibrillation: Secondary | ICD-10-CM | POA: Diagnosis not present

## 2017-12-17 DIAGNOSIS — I610 Nontraumatic intracerebral hemorrhage in hemisphere, subcortical: Secondary | ICD-10-CM

## 2017-12-17 DIAGNOSIS — E119 Type 2 diabetes mellitus without complications: Secondary | ICD-10-CM

## 2017-12-17 DIAGNOSIS — F0390 Unspecified dementia without behavioral disturbance: Secondary | ICD-10-CM | POA: Diagnosis not present

## 2017-12-17 DIAGNOSIS — I5032 Chronic diastolic (congestive) heart failure: Secondary | ICD-10-CM | POA: Diagnosis not present

## 2017-12-17 LAB — BASIC METABOLIC PANEL
Anion gap: 9 (ref 5–15)
BUN: 14 mg/dL (ref 8–23)
CHLORIDE: 104 mmol/L (ref 98–111)
CO2: 27 mmol/L (ref 22–32)
CREATININE: 0.73 mg/dL (ref 0.44–1.00)
Calcium: 9.1 mg/dL (ref 8.9–10.3)
GFR calc Af Amer: >60 mL/min (ref >60)
GFR calc non Af Amer: >60 mL/min (ref >60)
GLUCOSE: 161 mg/dL — AB (ref 70–99)
Potassium: 3.6 mmol/L (ref 3.5–5.1)
SODIUM: 140 mmol/L (ref 135–145)

## 2017-12-17 LAB — CBC WITH DIFFERENTIAL/PLATELET
Abs Immature Granulocytes: 0.04 10*3/uL (ref 0.00–0.07)
Basophils Absolute: 0.1 10*3/uL (ref 0.0–0.1)
Basophils Relative: 1 %
EOS PCT: 2 %
Eosinophils Absolute: 0.2 10*3/uL (ref 0.0–0.5)
HEMATOCRIT: 38.4 % (ref 36.0–46.0)
HEMOGLOBIN: 12.3 g/dL (ref 12.0–15.0)
Immature Granulocytes: 0 %
LYMPHS PCT: 24 %
Lymphs Abs: 2.7 10*3/uL (ref 0.7–4.0)
MCH: 31.5 pg (ref 26.0–34.0)
MCHC: 32 g/dL (ref 30.0–36.0)
MCV: 98.2 fL (ref 80.0–100.0)
MONO ABS: 1 10*3/uL (ref 0.1–1.0)
MONOS PCT: 9 %
NEUTROS PCT: 64 %
NRBC: 0 % (ref 0.0–0.2)
Neutro Abs: 7.1 10*3/uL (ref 1.7–7.7)
Platelets: 252 10*3/uL (ref 150–400)
RBC: 3.91 MIL/uL (ref 3.87–5.11)
RDW: 14.2 % (ref 11.5–15.5)
WBC: 11.2 10*3/uL — AB (ref 4.0–10.5)

## 2017-12-17 NOTE — Progress Notes (Signed)
Provider:Creola Krotz Lyndel Safe MD   Location:    [facility] Room Number: 103/P Place of Service:  SNF [phone])  PCP: Celene Squibb, MD Patient Care Team: Celene Squibb, MD as PCP - General (Internal Medicine) Evans Lance, MD as Consulting Physician (Cardiology)  Extended Emergency Contact Information Primary Emergency Contact: Raylene Everts Address: [address] [place] [address] Johnnette Litter of [place] Phone: [phone] Mobile Phone: [phone] Relation: Daughter Secondary Emergency Contact: Benjamine Mola [place] of [place] Mobile Phone: [phone] Relation: Granddaughter  Code Status: DNR Goals of Care: Advanced Directive information Advanced Directives 12/17/2017  Does Patient Have a Medical Advance Directive? Yes  Type of Advance Directive Out of facility DNR (pink MOST or yellow form)  Does patient want to make changes to medical advance directive? No - Patient declined  Copy of [place] in Chart? -  Would patient like information on creating a medical advance directive? No - Patient declined  Pre-existing out of facility DNR order (yellow form or pink MOST form) -      Chief Complaint  Patient presents with  . New Admit To SNF    New Admission Visit    HPI: Patient is a 83 y.o. female seen today for admission to SNF for therapy She stayed in the hospital from 11/14-11/20  Patient has a history of chronic A. fib on Coumadin, coronary artery disease, peripheral vascular disease, hypertension, type 2 diabetes and dementia. H/o Sick sinus has pacemaker  Patient came to the  ED as family noticed that she had facial droop. She was found to have a right lentiform hemorrhage with mild edema. She was given at [facility] for reversal as she is on Coumadin for her A. Fib Patient also had Hypoxia and had a CT scan of her chest done which showed acute CHF.  Echo showed diastolic dysfunction.  She was diuresed with Lasix  and eventually improved.   She also had some problems with agitation especially in the evening in the hospital and was started on low-dose of Seroquel. Vernard Gambles is here for therapy. She does not remember anything from the hospital.  She did not have any acute complaints denied any cough shortness of breath or chest pain. She lives by herself in an apartment but has a sister who lives next door.  She was not driving and was walking with the walker as per family she was little unsteady.  Past Medical History:  Diagnosis Date  . Arthritis   . Atrial fibrillation ([place])   . Carotid artery disease ([place])    Right carotid endarectomy 1999  . Chronic anticoagulation    Followed by PMD  . Coronary atherosclerosis    a. Minor at cardiac catheterization 2005 b. cath 10/16/2014 40% prox LAD dx, otherwise minimal CAD  . Depression   . Essential hypertension   . Glucose intolerance (impaired glucose tolerance)   . History of kidney stones   . Hyperlipidemia   . Pneumonia 2010  . Sick sinus syndrome [facility])    [employer] PPM   Past Surgical History:  Procedure Laterality Date  . ABDOMINAL HYSTERECTOMY    . APPENDECTOMY    . BREAST BIOPSY Bilateral    x 7 total  . CARDIAC CATHETERIZATION N/A 10/16/2014   Procedure: Left Heart Cath and Coronary Angiography;  Surgeon: Leonie Man, MD;  Location: [place] [facility];  Service: Cardiovascular;  Laterality: N/A;  . CAROTID  ENDARTERECTOMY Right 1999  . CATARACT EXTRACTION W/ INTRAOCULAR LENS  IMPLANT, BILATERAL Bilateral   . COLONOSCOPY     2006  . CYSTOSCOPY W/ URETERAL STENT PLACEMENT Left 04/19/2012   Procedure: CYSTOSCOPY WITH RETROGRADE PYELOGRAM/URETERAL STENT PLACEMENT ;  Surgeon: Ailene Rud, MD;  Location: [facility];  Service: Urology;  Laterality: Left;  . CYSTOSCOPY WITH RETROGRADE PYELOGRAM, URETEROSCOPY AND STENT PLACEMENT Left 06/30/2012   Procedure: CYSTOSCOPY WITH LEFT  RETROGRADE PYELOGRAM, URETEROSCOPY  with basketing of stone, AND  STENT PLACEMENT, TRANSURETHRAL UNROOFING OF URETER.;  Surgeon: Alexis Frock, MD;  Location: [facility];  Service: Urology;  Laterality: Left;  . INSERT / REPLACE / REMOVE PACEMAKER  2006  . OPEN REDUCTION INTERNAL FIXATION (ORIF) DISTAL RADIAL FRACTURE Right 04/29/2016   Procedure: OPEN REDUCTION INTERNAL FIXATION (ORIF) DISTAL RADIAL FRACTURE;  Surgeon: Roseanne Kaufman, MD;  Location: [facility];  Service: Orthopedics;  Laterality: Right;  . ORIF DISTAL RADIUS FRACTURE Right 04/29/2016    reports that she has never smoked. She has never used smokeless tobacco. She reports that she does not drink alcohol or use drugs. Social History   Socioeconomic History  . Marital status: Widowed    Spouse name: Not on file  . Number of children: Not on file  . Years of education: Not on file  . Highest education level: Not on file  Occupational History  . Not on file  Social Needs  . Financial resource strain: Not on file  . Food insecurity:    Worry: Not on file    Inability: Not on file  . Transportation needs:    Medical: Not on file    Non-medical: Not on file  Tobacco Use  . Smoking status: Never Smoker  . Smokeless tobacco: Never Used  Substance and Sexual Activity  . Alcohol use: No    Alcohol/week: 0.0 standard drinks  . Drug use: No  . Sexual activity: Never  Lifestyle  . Physical activity:    Days per week: Not on file    Minutes per session: Not on file  . Stress: Not on file  Relationships  . Social connections:    Talks on phone: Not on file    Gets together: Not on file    Attends religious service: Not on file    Active member of club or organization: Not on file    Attends meetings of clubs or organizations: Not on file    Relationship status: Not on file  . Intimate partner violence:    Fear of current or ex partner: Not on file    Emotionally abused: Not on file    Physically abused: Not on file    Forced sexual activity: Not on file  Other Topics Concern  . Not on  file  Social History Narrative  . Not on file    Functional Status Survey:    Family History  Problem Relation Age of Onset  . Heart attack Mother        Died age 67  . Hyperlipidemia Mother   . Diabetes Mother   . Coronary artery disease Father   . Alcohol abuse Father   . Hyperlipidemia Father   . Diabetes Sister   . Hypertension Sister   . Breast cancer Sister   . Breast cancer Sister     Health Maintenance  Topic Date Due  . INFLUENZA VACCINE  01/15/2018 (Originally 08/27/2017)  . FOOT EXAM  01/15[birth year]2018 (Originally 09/10/1944)  . OPHTHALMOLOGY EXAM  01/15/2018 (Originally 09/10/1944)  .  TETANUS/TDAP  01/15/2018 (Originally 09/10/1953)  . PNA vac Low Risk Adult (2 of 2 - PPSV23) 01/15/2018 (Originally 04/07/2014)  . URINE MICROALBUMIN  01/16/2018 (Originally 09/10/1944)  . HEMOGLOBIN A1C  06/12/2018  . DEXA SCAN  Completed    Allergies  Allergen Reactions  . Morphine Nausea Only  . Penicillins Other (See Comments)    Has patient had a PCN reaction causing immediate rash, facial/tongue/throat swelling, SOB or lightheadedness with hypotension: NO Has patient had a PCN reaction causing severe rash involving mucus membranes or skin necrosis: no Has patient had a PCN reaction that required hospitalization: NO Has patient had a PCN reaction occurring within the last 10 years: NO If all of the above answers are "NO", then may proceed with Cephalosporin use.     Outpatient Encounter Medications as of 12/17/2017  Medication Sig  . acetaminophen (TYLENOL) 325 MG tablet Take 2 tablets (650 mg total) by mouth every 6 (six) hours as needed for mild pain (or temp > 37.5 C (99.5 F)).  [place] Kitchen diltiazem (TIAZAC) 240 MG 24 hr capsule Take 240 mg by mouth daily.  [place] Kitchen docusate sodium (COLACE) 100 MG capsule Take 100 mg by mouth daily.  . furosemide (LASIX) 40 MG tablet Take 1 tablet (40 mg total) by mouth daily.  [place] Kitchen losartan (COZAAR) 50 MG tablet Take 1 tablet (50 mg total) by mouth 2 (two) times  daily. In the morning  . Maltodextrin-Xanthan Gum (RESOURCE THICKENUP CLEAR) POWD Use prn liqiuids  . Melatonin 3 MG TABS Take 1 tablet (3 mg total) by mouth at bedtime as needed (SLEEP).  . metoprolol tartrate (LOPRESSOR) 50 MG tablet Take 50 mg by mouth 2 (two) times daily.  . NYSTATIN PO Swab mouth three times a day with nystatin times 5 days for thrush  . omeprazole (PRILOSEC) 20 MG capsule Take 20 mg by mouth daily.  . potassium chloride (K-DUR,KLOR-CON) 10 MEQ tablet Take 20 mEq by mouth daily.  . pravastatin (PRAVACHOL) 20 MG tablet Take 1 tablet by mouth at bedtime.  [place] Kitchen QUEtiapine (SEROQUEL) 25 MG tablet Take 0.5 tablets (12.5 mg total) by mouth at bedtime.  [place] Kitchen venlafaxine (EFFEXOR) 100 MG tablet Take 100 mg by mouth daily.   . [DISCONTINUED] diltiazem (CARDIZEM CD) 240 MG 24 hr capsule Take 240 mg by mouth every morning.    No facility-administered encounter medications on file as of 12/17/2017.      Review of Systems  Unable to perform ROS: Dementia    Vitals:   12/17/17 0939  BP: (!) 155/97  Pulse: 93  Resp: 19  Temp: 97.6 F (36.4 C)  TempSrc: Oral  SpO2: 100%   There is no height or weight on file to calculate BMI. Physical Exam  Constitutional: She appears well-developed and well-nourished.  HENT:  Head: Normocephalic.  Mouth/Throat: Oropharynx is clear and moist.  Eyes: Pupils are equal, round, and reactive to light.  Neck: Neck supple.  Cardiovascular: Normal rate.  Irregular Rythm  Pulmonary/Chest: Effort normal and breath sounds normal. No stridor. No respiratory distress. She has no wheezes.  Abdominal: Soft. Bowel sounds are normal. She exhibits no distension. There is no tenderness. There is no guarding.  Musculoskeletal: She exhibits no edema.  Neurological: She is alert.  Not oriented to Time or Place. Did not knew her DOB Had Left facial Droop and Mild weakness in Left UE  Skin:  Has a bruise around Left eye  Psychiatric: She has a normal mood  and affect. Her behavior is normal.  Thought content normal.    Labs reviewed: Basic Metabolic Panel: Recent Labs    12/10/17 2336  12/15/17 0520 12/06/2017 0426 12/17/17 0700  NA 139   < > 140 140 140  K 3.6   < > 3.0* 3.4* 3.6  CL 105   < > 105 102 104  CO2 27   < > 27 27 27   GLUCOSE 170*   < > 164* 173* 161*  BUN 16   < > 14 20 14   CREATININE 0.94   < > 0.98 1.02* 0.73  CALCIUM 8.7*   < > 9.1 9.5 9.1  MG 1.8  --  1.8  --   --    < > = values in this interval not displayed.   Liver Function Tests: Recent Labs    11/24/17 1712 12/10/17 0701  AST 20 21  ALT 16 17  ALKPHOS 100 86  BILITOT 0.6 0.7  PROT 7.4 7.5  ALBUMIN 3.6 3.6   No results for input(s): LIPASE, AMYLASE in the last 8760 hours. No results for input(s): AMMONIA in the last 8760 hours. CBC: Recent Labs    12/10/17 0701  12/14/17 0454 12/15/17 0520 12/15/2017 0426 12/17/17 0700  WBC 10.5   < > 10.9* 11.1* 13.1* 11.2*  NEUTROABS 6.4  --  7.4  --   --  7.1  HGB 14.9   < > 11.7* 12.2 12.5 12.3  HCT 45.1   < > 36.5 37.1 38.6 38.4  MCV 95.6   < > 99.5 96.9 97.2 98.2  PLT 280   < > 179 234 234 252   < > = values in this interval not displayed.   Cardiac Enzymes: No results for input(s): CKTOTAL, CKMB, CKMBINDEX, TROPONINI in the last 8760 hours. BNP: Invalid input(s): POCBNP Lab Results  Component Value Date   HGBA1C 7.9 (H) 12/12/2017   Lab Results  Component Value Date   TSH 2.115 12/16/2013   Lab Results  Component Value Date   VITAMINB12 1,136 (H) 08/31/2013   No results found for: FOLATE No results found for: IRON, TIBC, FERRITIN  Imaging and Procedures obtained prior to SNF admission: No results found.  Assessment/Plan Intracerebral Hemorrhage BP controlled Not on Coumadin or Aspirin Follow up with Neurology in 1 month to follow for Anticoagulation. Start on Therapy  Diabetes mellitus  Was taken off her Meds in hospital  Accu checks Will start at lower dose of Glucotrol 2.5  mg  Chronic diastolic heart failure  Started on Lasix Stable Follow up weight and renal fucntion  Essential hypertension metoprolol and Losartan Atrial fibrillation,  Rate control on Metoprolol Not on anticoagulation right now Dementia with agitation On Effexor and Seroquel Hyperlipidemia On statin LDL was 107 Increased Pravachol to 40 mg   Family/ staff Communication:   Labs/tests ordered:  Total time spent in this patient care encounter was 45_ minutes; greater than 50% of the visit spent counseling patient, reviewing records , Labs and coordinating care for problems addressed at this encounter.

## 2017-12-18 ENCOUNTER — Encounter: Payer: Self-pay | Admitting: Internal Medicine

## 2017-12-18 ENCOUNTER — Non-Acute Institutional Stay (SKILLED_NURSING_FACILITY): Payer: PPO | Admitting: Internal Medicine

## 2017-12-18 ENCOUNTER — Encounter (HOSPITAL_COMMUNITY)
Admission: RE | Admit: 2017-12-18 | Discharge: 2017-12-18 | Disposition: A | Discharge status: Home / Self Care | Payer: PPO | Source: Skilled Nursing Facility | Attending: *Deleted | Admitting: *Deleted

## 2017-12-18 DIAGNOSIS — E118 Type 2 diabetes mellitus with unspecified complications: Secondary | ICD-10-CM | POA: Diagnosis not present

## 2017-12-18 DIAGNOSIS — I610 Nontraumatic intracerebral hemorrhage in hemisphere, subcortical: Secondary | ICD-10-CM | POA: Diagnosis not present

## 2017-12-18 DIAGNOSIS — I5032 Chronic diastolic (congestive) heart failure: Secondary | ICD-10-CM

## 2017-12-18 DIAGNOSIS — I10 Essential (primary) hypertension: Secondary | ICD-10-CM | POA: Diagnosis not present

## 2017-12-18 NOTE — Progress Notes (Signed)
Location:    [facility] Room Number: 103/P Place of Service:  SNF [phone]) Provider:  Cory Roughen, MD  Patient Care Team: Celene Squibb, MD as PCP - General (Internal Medicine) Evans Lance, MD as Consulting Physician (Cardiology)  Extended Emergency Contact Information Primary Emergency Contact: Raylene Everts Address[address], [place] [address] [place] Phone: [phone] Mobile Phone: [phone] Relation: Daughter Secondary Emergency Contact: Benjamine Mola [place] Mobile Phone: [phone] Relation: Granddaughter  Code Status:  DNR Goals of care: Advanced Directive information Advanced Directives 12/18/2017  Does Patient Have a Medical Advance Directive? Yes  Type of Advance Directive Out of facility DNR (pink MOST or yellow form)  Does patient want to make changes to medical advance directive? No - Patient declined  Copy of [place] in Chart? -  Would patient like information on creating a medical advance directive? No - Patient declined  Pre-existing out of facility DNR order (yellow form or pink MOST form) -     Chief Complaint  Patient presents with  . Acute Visit    Elevated BP    HPI:  Pt is a 83 y.o. female seen today for an acute visit for follow-up of hypertension. Patient is here for rehab after sustaining a CVA- she has a history of chronic A. fib had been on Coumadin no longer on this secondary to the CVA with history of intra-cranial bleed.  She also has a history of coronary artery disease peripheral vascular disease hypertension type 2 diabetes and dementia.  She does have a pacemaker with a history of sick sinus syndrome.  In the ED she was noticed to have a left facial droop and had a right lentiform hemorrhage with mild edema she was given Kcentra for reversal.  CT scan of the chest also showed acute CHF-echo showed diastolic dysfunction  she was diuresed with Lasix and has been discharged on p.o. Lasix- her weight appears to be stable.  She is lost a couple pounds since her admission earlier this week.  She also had agitation and was started on Seroquel she does have a history of dementia.  Regards to hypertension she continues on diltiazem 240 mg a day as well as Lopressor 50 mg twice a day and losartan 50 mg twice daily.  In addition to the Lasix which she is also on for CHF.  Apparently this morning blood pressure was noted to be somewhat elevated at 170/80 manually appears since she is been here her systolics have been ru  At times with diastolics appears from the 80s to 90s- see one reading with a diastolic of 527 but this was by machine and I suspect is more of a machine variation  .  I did see her this afternoon after she received her medicines and blood pressure was down at 128/74.  I suspect with the medications he comes down pretty significantly.  Clinically appears to be stable her only complaint is she wants to go home does not complain of headache dizziness she is resting comfortably in her [place] chair        Past Medical History:  Diagnosis Date  . Arthritis   . Atrial fibrillation ([place])   . Carotid artery disease ([place])    Right carotid endarectomy 1999  . Chronic anticoagulation    Followed by PMD  . Coronary atherosclerosis    a.  Minor at cardiac catheterization 2005 b. cath 10/16/2014 40% prox LAD dx, otherwise minimal CAD  . Depression   . Essential hypertension   . Glucose intolerance (impaired glucose tolerance)   . History of kidney stones   . Hyperlipidemia   . Pneumonia 2010  . Sick sinus syndrome [facility])    [employer] PPM   Past Surgical History:  Procedure Laterality Date  . ABDOMINAL HYSTERECTOMY    . APPENDECTOMY    . BREAST BIOPSY Bilateral    x 7 total  . CARDIAC CATHETERIZATION N/A 10/16/2014   Procedure: Left Heart Cath and Coronary Angiography;  Surgeon: Leonie Man, MD;   Location: [facility];  Service: Cardiovascular;  Laterality: N/A;  . CAROTID ENDARTERECTOMY Right 1999  . CATARACT EXTRACTION W/ INTRAOCULAR LENS  IMPLANT, BILATERAL Bilateral   . COLONOSCOPY     2006  . CYSTOSCOPY W/ URETERAL STENT PLACEMENT Left 04/19/2012   Procedure: CYSTOSCOPY WITH RETROGRADE PYELOGRAM/URETERAL STENT PLACEMENT ;  Surgeon: Ailene Rud, MD;  Location: [facility];  Service: Urology;  Laterality: Left;  . CYSTOSCOPY WITH RETROGRADE PYELOGRAM, URETEROSCOPY AND STENT PLACEMENT Left 06/30/2012   Procedure: CYSTOSCOPY WITH LEFT  RETROGRADE PYELOGRAM, URETEROSCOPY  with basketing of stone, AND STENT PLACEMENT, TRANSURETHRAL UNROOFING OF URETER.;  Surgeon: Alexis Frock, MD;  Location: [facility];  Service: Urology;  Laterality: Left;  . INSERT / REPLACE / REMOVE PACEMAKER  2006  . OPEN REDUCTION INTERNAL FIXATION (ORIF) DISTAL RADIAL FRACTURE Right 04/29/2016   Procedure: OPEN REDUCTION INTERNAL FIXATION (ORIF) DISTAL RADIAL FRACTURE;  Surgeon: Roseanne Kaufman, MD;  Location: [facility];  Service: Orthopedics;  Laterality: Right;  . ORIF DISTAL RADIUS FRACTURE Right 04/29/2016    Allergies  Allergen Reactions  . Morphine Nausea Only  . Penicillins Other (See Comments)    Has patient had a PCN reaction causing immediate rash, facial/tongue/throat swelling, SOB or lightheadedness with hypotension: NO Has patient had a PCN reaction causing severe rash involving mucus membranes or skin necrosis: no Has patient had a PCN reaction that required hospitalization: NO Has patient had a PCN reaction occurring within the last 10 years: NO If all of the above answers are "NO", then may proceed with Cephalosporin use.     Outpatient Encounter Medications as of 12/18/2017  Medication Sig  . acetaminophen (TYLENOL) 325 MG tablet Take 2 tablets (650 mg total) by mouth every 6 (six) hours as needed for mild pain (or temp > 37.5 C (99.5 F)).  Marland Kitchen diltiazem (TIAZAC) 240 MG 24 hr capsule Take 240  mg by mouth daily.  Marland Kitchen docusate sodium (COLACE) 100 MG capsule Take 100 mg by mouth daily.  . furosemide (LASIX) 40 MG tablet Take 1 tablet (40 mg total) by mouth daily.  Marland Kitchen glipiZIDE (GLUCOTROL) 5 MG tablet Take 2.5 mg by mouth daily before breakfast.  . losartan (COZAAR) 50 MG tablet Take 1 tablet (50 mg total) by mouth 2 (two) times daily. In the morning  . Maltodextrin-Xanthan Gum (RESOURCE THICKENUP CLEAR) POWD Use prn liqiuids  . Melatonin 3 MG TABS Take 1 tablet (3 mg total) by mouth at bedtime as needed (SLEEP).  . metoprolol tartrate (LOPRESSOR) 50 MG tablet Take 50 mg by mouth 2 (two) times daily.  . NYSTATIN PO Swab mouth three times a day with nystatin times 5 days for thrush  . omeprazole (PRILOSEC) 20 MG capsule Take 20 mg by mouth daily.  . potassium chloride (K-DUR,KLOR-CON) 10 MEQ tablet Take 20 mEq by mouth daily.  Marland Kitchen  pravastatin (PRAVACHOL) 20 MG tablet Take 40 mg by mouth daily.   . QUEtiapine (SEROQUEL) 25 MG tablet Take 0.5 tablets (12.5 mg total) by mouth at bedtime.  Marland Kitchen venlafaxine (EFFEXOR) 100 MG tablet Take 100 mg by mouth daily.    No facility-administered encounter medications on file as of 12/18/2017.     Review of Systems   This is limited secondary to dementia as noted above she does not have any complaints other than saying she wants to go home does not complain of pain headache dizziness or shortness of breath  Immunization History  Administered Date(s) Administered  . Influenza,inj,Quad PF,6+ Mos 10/14/2014  . Pneumococcal Conjugate-13 04/06/2013   Pertinent  Health Maintenance Due  Topic Date Due  . INFLUENZA VACCINE  01/15/2018 (Originally 08/27/2017)  . FOOT EXAM  01/15[birth year]2018 (Originally 09/10/1944)  . OPHTHALMOLOGY EXAM  01/15/2018 (Originally 09/10/1944)  . PNA vac Low Risk Adult (2 of 2 - PPSV23) 01/15/2018 (Originally 04/07/2014)  . URINE MICROALBUMIN  01/16/2018 (Originally 09/10/1944)  . HEMOGLOBIN A1C  06/12/2018  . DEXA SCAN  Completed   Fall  Risk  04/06/2013  Falls in the past year? No   Functional Status Survey:    Vitals:   12/18/17 1045  BP: (!) 170/80  Pulse: 94  Resp: 20  Temp: (!) 97.4 F (36.3 C)  TempSrc: Oral  SpO2: 95%  Weight is 168.4 this appears to be down a couple pounds since admission Updated blood pressure is 128/74  Physical Exam   General this is a pleasant elderly female in no distress.  Her skin is warm and dry continues with resolving bruising under her eyes bilaterally.  Eyes visual acuity is intact sclera and conjunctive are clear pupils appear to equal round reactive to light.  Oropharynx is clear mucous membranes moist.  Chest is clear to auscultation there is no labored breathing.  Heart is regular irregular rate and rhythm at 76 auscultation without murmur gallop rub she does not really have significant lower extremity edema.  Her abdomen is obese soft nontender with positive bowel sounds.  Musculoskeletal continues with some mild left upper extremity weakness at baseline is able to move all her extremities right upper and lower extremity strength appears preserved somewhat difficult to fully assess lower extremities because patient is in a Geri chair  Neurologic as noted above her speech is clear she does continue with a mouth droop.  Psych she is oriented to self pleasant and cooperative with exam-      Labs reviewed: Recent Labs    12/10/17 2336  12/15/17 0520 12/15/2017 0426 12/17/17 0700  NA 139   < > 140 140 140  K 3.6   < > 3.0* 3.4* 3.6  CL 105   < > 105 102 104  CO2 27   < > 27 27 27   GLUCOSE 170*   < > 164* 173* 161*  BUN 16   < > 14 20 14   CREATININE 0.94   < > 0.98 1.02* 0.73  CALCIUM 8.7*   < > 9.1 9.5 9.1  MG 1.8  --  1.8  --   --    < > = values in this interval not displayed.   Recent Labs    11/24/17 1712 12/10/17 0701  AST 20 21  ALT 16 17  ALKPHOS 100 86  BILITOT 0.6 0.7  PROT 7.4 7.5  ALBUMIN 3.6 3.6   Recent Labs    12/10/17 0701   12/14/17 0454 12/15/17 0520  11/29/2017 0426 12/17/17 0700  WBC 10.5   < > 10.9* 11.1* 13.1* 11.2*  NEUTROABS 6.4  --  7.4  --   --  7.1  HGB 14.9   < > 11.7* 12.2 12.5 12.3  HCT 45.1   < > 36.5 37.1 38.6 38.4  MCV 95.6   < > 99.5 96.9 97.2 98.2  PLT 280   < > 179 234 234 252   < > = values in this interval not displayed.   Lab Results  Component Value Date   TSH 2.115 12/16/2013   Lab Results  Component Value Date   HGBA1C 7.9 (H) 12/12/2017   Lab Results  Component Value Date   CHOL 170 12/12/2017   HDL 33 (L) 12/12/2017   LDLCALC 107 (H) 12/12/2017   TRIG 148 12/12/2017   CHOLHDL 5.2 12/12/2017    Significant Diagnostic Results in last 30 days:  Ct Angio Head W Or Wo Contrast  Result Date: 12/11/2017 CLINICAL DATA:  Follow-up examination for known intracranial hemorrhage. Patient with right lentiform intraparenchymal hematoma. EXAM: CT ANGIOGRAPHY HEAD AND NECK TECHNIQUE: Multidetector CT imaging of the head and neck was performed using the standard protocol during bolus administration of intravenous contrast. Multiplanar CT image reconstructions and MIPs were obtained to evaluate the vascular anatomy. Carotid stenosis measurements (when applicable) are obtained utilizing NASCET criteria, using the distal internal carotid diameter as the denominator. CONTRAST:  49mL ISOVUE-370 IOPAMIDOL (ISOVUE-370) INJECTION 76% COMPARISON:  Prior CT from 12/10/2017 as well as earlier studies. FINDINGS: CTA NECK FINDINGS Aortic arch: Visualized aortic arch of normal caliber with normal branch pattern. Moderate atherosclerotic change about the aortic arch and origin of the great vessels without hemodynamically significant stenosis. Visualized subclavian arteries widely patent. Right carotid system: Scattered non stenotic plaque within the right common carotid artery without significant narrowing. Concentric calcified plaque about the right bifurcation/proximal right ICA without hemodynamically  significant stenosis. Probable remote changes related to prior endarterectomy noted. Right ICA patent from the bifurcation to the skull base without stenosis, dissection, or occlusion. Left carotid system: Left common carotid artery patent from its origin to the bifurcation without significant stenosis. Left common carotid artery medialized into the retropharyngeal space. Scattered a centric calcified plaque about the left bifurcation/proximal left ICA with associated stenosis of up to approximately 50% by NASCET criteria. Left ICA widely patent distally to the skull base without stenosis, dissection, or occlusion. Vertebral arteries: Both of the vertebral arteries arise from the subclavian arteries. The left vertebral artery appears to be slightly dominant. Left vertebral artery essentially occludes just beyond its origin, and remains occluded to approximately the level of C4-5. Distal reconstitution likely via muscular branches. Irregular attenuated flow seen distally within the left vertebral artery which is otherwise patent to the skull base. The right vertebral artery is occluded at its origin. Irregular distal reconstitution at approximately the level of C5-6 (series 8, image 231). Irregular thready and attenuated flow seen distally throughout the right ICA with multifocal moderate to severe segmental stenoses. Right vertebral artery is nearly occluded as it reaches the cranial vault (series 8, image 165). Skeleton: No acute osseus abnormality. No discrete lytic or blastic osseous lesions. Moderate cervical spondylolysis noted at C4-5 through C6-7. Other neck: No acute soft tissue abnormality within the neck. Chronic left maxillary sinusitis noted. Salivary glands within normal limits. No adenopathy. Thyroid within normal limits. Upper chest: Streak artifact from left-sided pacemaker/AICD. Left-sided pleural effusion with associated atelectasis partially visualized. Additional scattered atelectatic changes  noted  within the visualized lungs. Review of the MIP images confirms the above findings CTA HEAD FINDINGS Anterior circulation: Petrous segments widely patent bilaterally. Advanced atheromatous plaque within the cavernous/supraclinoid ICAs with moderate multifocal narrowing. ICA termini widely patent. Left A1 segment irregular but patent without high-grade stenosis. Severe diffuse stenosis of the right A1 segment. Normal anterior communicating artery. Extensive atheromatous irregularity throughout the ACAs, left greater than right were there are multifocal moderate to severe stenoses. Left M1 irregular but widely patent without high-grade stenosis. Focal moderate proximal right M1 stenosis noted (series 8, image 92). Normal MCA bifurcations. No proximal M2 occlusion. Extensive small vessel atheromatous irregularity throughout the MCA branches bilaterally which are well perfused and fairly symmetric. Posterior circulation: Multifocal atheromatous irregularity within the dominant left V4 segment which is patent to the vertebrobasilar junction without high-grade stenosis. Patent left PICA. Diminutive right vertebral artery nearly occluded at the skull base, with scant irregular flow seen within the right V4 segment. Right V4 occludes prior to the vertebrobasilar junction. Right PICA of not seen. Basilar artery somewhat diminutive. Severe atheromatous change throughout the mid and distal basilar artery with multifocal severe stenoses. Anterior inferior cerebral arteries patent bilaterally. Superior cerebral arteries grossly patent. Predominant fetal type origin of the PCAs with patent and robust posterior communicating arteries. PCAs are patent to their distal aspects with multifocal moderate to severe distal P3 and P4 stenoses. Venous sinuses: Not well assessed due to arterial timing of the contrast bolus. Anatomic variants: Fetal type origin of the PCAs with underlying diminutive vertebrobasilar system. Focal  outpouching at the cavernous left ICA in the region of the left hypophyseal artery favored to reflect a normal vascular infundibulum. No AVM or other vascular abnormality seen underlying the right lentiform hemorrhage. Delayed phase: No abnormal enhancement. Right lentiform nucleus hemorrhage relatively stable measuring 13 x 14 mm. No intraventricular extension. Review of the MIP images confirms the above findings IMPRESSION: 1. No significant interval change in size and appearance right lenticular hematoma. No underlying vascular abnormality identified. 2. Severe vertebrobasilar atherosclerotic disease as above. Proximal-mid vertebral arteries are occluded within the neck with distal reconstitution. Right vertebral subsequently occludes at the skull base. Multifocal severe stenoses seen throughout the basilar artery. Overall, vertebrobasilar system is diminutive with fetal type origin of the PCAs. 3. Approximate 50% atheromatous stenosis at the left carotid bifurcation. 4. Sequelae of remote right carotid endarterectomy, with no significant residual or recurrent stenosis. 5. Additional extensive atherosclerotic change throughout the intracranial circulation as above. No large vessel occlusion. Electronically Signed   By: Jeannine Boga M.D.   On: 12/11/2017 17:12   Dg Chest 2 View  Result Date: 11/24/2017 CLINICAL DATA:  Headache EXAM: CHEST - 2 VIEW COMPARISON:  February 07, 2016 FINDINGS: The heart size and mediastinal contours are stable. The heart size is enlarged. Cardiac pacemaker is unchanged. There is a small left pleural effusion. No focal pneumonia or pulmonary edema is identified. The visualized skeletal structures are stable. IMPRESSION: Small left pleural effusion.  Cardiomegaly. Electronically Signed   By: Abelardo Diesel M.D.   On: 11/24/2017 18:25   Dg Pelvis 1-2 Views  Result Date: 11/24/2017 CLINICAL DATA:  Fall EXAM: PELVIS - 1-2 VIEW COMPARISON:  Pelvic radiograph 04/20/2014  FINDINGS: There are old fractures of the right superior and inferior pubic rami. No acute fracture. Both hips are approximated. IMPRESSION: No acute abnormality Electronically Signed   By: Ulyses Jarred M.D.   On: 11/24/2017 18:27   Ct Head Wo Contrast  Result Date:  12/10/2017 CLINICAL DATA:  Intracerebral hemorrhage follow-up EXAM: CT HEAD WITHOUT CONTRAST TECHNIQUE: Contiguous axial images were obtained from the base of the skull through the vertex without intravenous contrast. COMPARISON:  CT head 12/10/2017 FINDINGS: Brain: Right posterior basal ganglia and deep white matter hematoma is unchanged in size measuring 13 x 13 by 26 mm. No intraventricular extension. No mass-effect or midline shift. Moderate atrophy. Ventricular enlargement due to atrophy is unchanged. Extensive chronic microvascular ischemic changes in the white matter. Densely calcified mass along the right tentorium projecting inferiorly is unchanged measuring 12 mm compatible with meningioma. Vascular: Extensive atherosclerotic calcification. Negative for hyperdense vessel Skull: Negative Sinuses/Orbits: Extensive mucosal edema left maxillary ethmoid and frontal sinus unchanged. Calcified secretions left ethmoid sinus. Other: None IMPRESSION: Right lenticular hematoma unchanged from earlier today. No intraventricular hemorrhage or midline shift. Electronically Signed   By: Franchot Gallo M.D.   On: 12/10/2017 19:19   Ct Head Wo Contrast  Result Date: 11/24/2017 CLINICAL DATA:  Fall with headache EXAM: CT HEAD WITHOUT CONTRAST CT CERVICAL SPINE WITHOUT CONTRAST TECHNIQUE: Multidetector CT imaging of the head and cervical spine was performed following the standard protocol without intravenous contrast. Multiplanar CT image reconstructions of the cervical spine were also generated. COMPARISON:  Head CT 05/20/2016 FINDINGS: CT HEAD FINDINGS Brain: There is no mass, hemorrhage or extra-axial collection. There is generalized atrophy without  lobar predilection. There is hypoattenuation of the periventricular white matter, most commonly indicating chronic ischemic microangiopathy. Old left basal ganglia lacunar infarct. Vascular: Atherosclerotic calcification of the internal carotid arteries at the skull base. No abnormal hyperdensity of the major intracranial arteries or dural venous sinuses. Skull: Large left frontal scalp hematoma.  No underlying fracture. Sinuses/Orbits: No fluid levels or advanced mucosal thickening of the visualized paranasal sinuses. No mastoid or middle ear effusion. The orbits are normal. CT CERVICAL SPINE FINDINGS Alignment: No static subluxation. Facets are aligned. Occipital condyles are normally positioned. Skull base and vertebrae: No acute fracture. Soft tissues and spinal canal: No prevertebral fluid or swelling. No visible canal hematoma. Disc levels: Multilevel degenerative disc disease and mild facet hypertrophy. No bony spinal canal stenosis. Upper chest: No pneumothorax, pulmonary nodule or pleural effusion. Other: Normal visualized paraspinal cervical soft tissues. IMPRESSION: 1. Left frontal scalp hematoma without calvarial fracture or acute intracranial abnormality. 2. Chronic ischemic microangiopathy and generalized volume loss. 3. No acute abnormality of the cervical spine. Electronically Signed   By: Ulyses Jarred M.D.   On: 11/24/2017 18:39   Ct Angio Neck W Or Wo Contrast  Result Date: 12/11/2017 CLINICAL DATA:  Follow-up examination for known intracranial hemorrhage. Patient with right lentiform intraparenchymal hematoma. EXAM: CT ANGIOGRAPHY HEAD AND NECK TECHNIQUE: Multidetector CT imaging of the head and neck was performed using the standard protocol during bolus administration of intravenous contrast. Multiplanar CT image reconstructions and MIPs were obtained to evaluate the vascular anatomy. Carotid stenosis measurements (when applicable) are obtained utilizing NASCET criteria, using the distal  internal carotid diameter as the denominator. CONTRAST:  75mL ISOVUE-370 IOPAMIDOL (ISOVUE-370) INJECTION 76% COMPARISON:  Prior CT from 12/10/2017 as well as earlier studies. FINDINGS: CTA NECK FINDINGS Aortic arch: Visualized aortic arch of normal caliber with normal branch pattern. Moderate atherosclerotic change about the aortic arch and origin of the great vessels without hemodynamically significant stenosis. Visualized subclavian arteries widely patent. Right carotid system: Scattered non stenotic plaque within the right common carotid artery without significant narrowing. Concentric calcified plaque about the right bifurcation/proximal right ICA without hemodynamically significant stenosis.  Probable remote changes related to prior endarterectomy noted. Right ICA patent from the bifurcation to the skull base without stenosis, dissection, or occlusion. Left carotid system: Left common carotid artery patent from its origin to the bifurcation without significant stenosis. Left common carotid artery medialized into the retropharyngeal space. Scattered a centric calcified plaque about the left bifurcation/proximal left ICA with associated stenosis of up to approximately 50% by NASCET criteria. Left ICA widely patent distally to the skull base without stenosis, dissection, or occlusion. Vertebral arteries: Both of the vertebral arteries arise from the subclavian arteries. The left vertebral artery appears to be slightly dominant. Left vertebral artery essentially occludes just beyond its origin, and remains occluded to approximately the level of C4-5. Distal reconstitution likely via muscular branches. Irregular attenuated flow seen distally within the left vertebral artery which is otherwise patent to the skull base. The right vertebral artery is occluded at its origin. Irregular distal reconstitution at approximately the level of C5-6 (series 8, image 231). Irregular thready and attenuated flow seen distally  throughout the right ICA with multifocal moderate to severe segmental stenoses. Right vertebral artery is nearly occluded as it reaches the cranial vault (series 8, image 165). Skeleton: No acute osseus abnormality. No discrete lytic or blastic osseous lesions. Moderate cervical spondylolysis noted at C4-5 through C6-7. Other neck: No acute soft tissue abnormality within the neck. Chronic left maxillary sinusitis noted. Salivary glands within normal limits. No adenopathy. Thyroid within normal limits. Upper chest: Streak artifact from left-sided pacemaker/AICD. Left-sided pleural effusion with associated atelectasis partially visualized. Additional scattered atelectatic changes noted within the visualized lungs. Review of the MIP images confirms the above findings CTA HEAD FINDINGS Anterior circulation: Petrous segments widely patent bilaterally. Advanced atheromatous plaque within the cavernous/supraclinoid ICAs with moderate multifocal narrowing. ICA termini widely patent. Left A1 segment irregular but patent without high-grade stenosis. Severe diffuse stenosis of the right A1 segment. Normal anterior communicating artery. Extensive atheromatous irregularity throughout the ACAs, left greater than right were there are multifocal moderate to severe stenoses. Left M1 irregular but widely patent without high-grade stenosis. Focal moderate proximal right M1 stenosis noted (series 8, image 92). Normal MCA bifurcations. No proximal M2 occlusion. Extensive small vessel atheromatous irregularity throughout the MCA branches bilaterally which are well perfused and fairly symmetric. Posterior circulation: Multifocal atheromatous irregularity within the dominant left V4 segment which is patent to the vertebrobasilar junction without high-grade stenosis. Patent left PICA. Diminutive right vertebral artery nearly occluded at the skull base, with scant irregular flow seen within the right V4 segment. Right V4 occludes prior to  the vertebrobasilar junction. Right PICA of not seen. Basilar artery somewhat diminutive. Severe atheromatous change throughout the mid and distal basilar artery with multifocal severe stenoses. Anterior inferior cerebral arteries patent bilaterally. Superior cerebral arteries grossly patent. Predominant fetal type origin of the PCAs with patent and robust posterior communicating arteries. PCAs are patent to their distal aspects with multifocal moderate to severe distal P3 and P4 stenoses. Venous sinuses: Not well assessed due to arterial timing of the contrast bolus. Anatomic variants: Fetal type origin of the PCAs with underlying diminutive vertebrobasilar system. Focal outpouching at the cavernous left ICA in the region of the left hypophyseal artery favored to reflect a normal vascular infundibulum. No AVM or other vascular abnormality seen underlying the right lentiform hemorrhage. Delayed phase: No abnormal enhancement. Right lentiform nucleus hemorrhage relatively stable measuring 13 x 14 mm. No intraventricular extension. Review of the MIP images confirms the above findings IMPRESSION: 1.  No significant interval change in size and appearance right lenticular hematoma. No underlying vascular abnormality identified. 2. Severe vertebrobasilar atherosclerotic disease as above. Proximal-mid vertebral arteries are occluded within the neck with distal reconstitution. Right vertebral subsequently occludes at the skull base. Multifocal severe stenoses seen throughout the basilar artery. Overall, vertebrobasilar system is diminutive with fetal type origin of the PCAs. 3. Approximate 50% atheromatous stenosis at the left carotid bifurcation. 4. Sequelae of remote right carotid endarterectomy, with no significant residual or recurrent stenosis. 5. Additional extensive atherosclerotic change throughout the intracranial circulation as above. No large vessel occlusion. Electronically Signed   By: Jeannine Boga M.D.    On: 12/11/2017 17:12   Ct Chest Wo Contrast  Result Date: 12/13/2017 CLINICAL DATA:  Rales on auscultation. EXAM: CT CHEST WITHOUT CONTRAST TECHNIQUE: Multidetector CT imaging of the chest was performed following the standard protocol without IV contrast. COMPARISON:  08/19/2011. Portable chest obtained earlier today. FINDINGS: Cardiovascular: Enlarged heart due to left atrial and left ventricular enlargement. Atheromatous calcifications, including the coronary arteries and aorta. Small pericardial effusion with a maximum thickness of 8 mm. Left subclavian pacemaker leads in satisfactory position. Mediastinum/Nodes: No enlarged mediastinal or axillary lymph nodes. Thyroid gland, trachea, and esophagus demonstrate no significant findings. Lungs/Pleura: Small bilateral pleural effusions. Mild bilateral dependent atelectasis. Prominent pulmonary vasculature and interstitial markings. Upper Abdomen: Dense retained barium in the colon with associated streak artifacts. Musculoskeletal: Old, healed right rib fractures. Thoracic and lower cervical spine degenerative changes. IMPRESSION: 1. Changes of acute congestive heart failure with small bilateral pleural effusions and bilateral dependent atelectasis. 2.  Calcific coronary artery and aortic atherosclerosis. Aortic Atherosclerosis (ICD10-I70.0). Electronically Signed   By: Claudie Revering M.D.   On: 12/13/2017 17:02   Ct Cervical Spine Wo Contrast  Result Date: 11/24/2017 CLINICAL DATA:  Fall with headache EXAM: CT HEAD WITHOUT CONTRAST CT CERVICAL SPINE WITHOUT CONTRAST TECHNIQUE: Multidetector CT imaging of the head and cervical spine was performed following the standard protocol without intravenous contrast. Multiplanar CT image reconstructions of the cervical spine were also generated. COMPARISON:  Head CT 05/20/2016 FINDINGS: CT HEAD FINDINGS Brain: There is no mass, hemorrhage or extra-axial collection. There is generalized atrophy without lobar  predilection. There is hypoattenuation of the periventricular white matter, most commonly indicating chronic ischemic microangiopathy. Old left basal ganglia lacunar infarct. Vascular: Atherosclerotic calcification of the internal carotid arteries at the skull base. No abnormal hyperdensity of the major intracranial arteries or dural venous sinuses. Skull: Large left frontal scalp hematoma.  No underlying fracture. Sinuses/Orbits: No fluid levels or advanced mucosal thickening of the visualized paranasal sinuses. No mastoid or middle ear effusion. The orbits are normal. CT CERVICAL SPINE FINDINGS Alignment: No static subluxation. Facets are aligned. Occipital condyles are normally positioned. Skull base and vertebrae: No acute fracture. Soft tissues and spinal canal: No prevertebral fluid or swelling. No visible canal hematoma. Disc levels: Multilevel degenerative disc disease and mild facet hypertrophy. No bony spinal canal stenosis. Upper chest: No pneumothorax, pulmonary nodule or pleural effusion. Other: Normal visualized paraspinal cervical soft tissues. IMPRESSION: 1. Left frontal scalp hematoma without calvarial fracture or acute intracranial abnormality. 2. Chronic ischemic microangiopathy and generalized volume loss. 3. No acute abnormality of the cervical spine. Electronically Signed   By: Ulyses Jarred M.D.   On: 11/24/2017 18:39   Dg Chest Port 1 View  Result Date: 12/14/2017 CLINICAL DATA:  Pleural effusions and atelectasis EXAM: PORTABLE CHEST 1 VIEW COMPARISON:  12/13/2017 FINDINGS: Stable cardiomegaly. Improvement in the  perihilar and basilar edema pattern with residual pleural effusions and compressive basilar atelectasis, worse on the left. Upper lobes remain clear. No pneumothorax. Trachea is midline. Aorta is atherosclerotic. Left subclavian pacer noted. IMPRESSION: Improving CHF pattern with persistent cardiomegaly. Residual bilateral pleural effusions and basilar atelectasis/consolidation  worse on the left. Electronically Signed   By: Jerilynn Mages.  Shick M.D.   On: 12/14/2017 08:15   Dg Chest Port 1 View  Result Date: 12/13/2017 CLINICAL DATA:  Shortness of breath EXAM: PORTABLE CHEST 1 VIEW COMPARISON:  12/11/2017 FINDINGS: Left chest wall pacemaker is unchanged. There is moderate cardiomegaly with calcific aortic atherosclerosis. Consolidation at the left lung base with small pleural effusion has slightly worsened from the prior study. There is worsened aeration of the right lower lobe. IMPRESSION: 1. Worsening left basilar consolidation and small left pleural effusion. 2. Worsening aeration of the right lower lobe. Electronically Signed   By: Ulyses Jarred M.D.   On: 12/13/2017 03:26   Dg Chest Port 1 View  Result Date: 12/11/2017 CLINICAL DATA:  Shortness of Breath EXAM: PORTABLE CHEST 1 VIEW COMPARISON:  December 10, 2017 FINDINGS: There is airspace opacity in the left lower lobe with left pleural effusion. The right lung is clear. There is cardiomegaly with pulmonary vascularity normal. There is aortic atherosclerosis. Pacemaker leads are attached to the right atrium and right ventricle. Bones appear osteoporotic. There are surgical clips in the lower right neck region. IMPRESSION: Consolidation left lower lobe with small left pleural effusion. Right lung clear. There is stable cardiomegaly with aortic atherosclerosis. Pacemaker leads attached to right atrium and right ventricle. Bones osteoporotic. Aortic Atherosclerosis (ICD10-I70.0). Electronically Signed   By: Lowella Grip III M.D.   On: 12/11/2017 07:58   Dg Chest Portable 1 View  Result Date: 12/10/2017 CLINICAL DATA:  Shortness of breath.  Code stroke. EXAM: PORTABLE CHEST 1 VIEW COMPARISON:  11/24/2017. FINDINGS: Cardiac pacer noted with lead tips in right atrium right ventricle. Cardiomegaly with diffuse bilateral from interstitial prominence and left-sided pleural effusion. Findings consistent CHF. No pneumothorax. No acute  bony abnormality IMPRESSION: Cardiac pacer with lead tips over the right atrium right ventricle. Cardiomegaly with diffuse bilateral from interstitial prominence and small left pleural effusion consistent with CHF. Electronically Signed   By: Marcello Moores  Register   On: 12/10/2017 08:26   Dg Swallowing Func-speech Pathology  Result Date: 12/11/2017 Objective Swallowing Evaluation: Type of Study: MBS-Modified Barium Swallow Study  Patient Details Name: MEILIN BROSH MRN: [id number] Date of Birth: [birth year] Today's Date: 12/11/2017 Time: SLP Start Time (ACUTE ONLY): 8657 -SLP Stop Time (ACUTE ONLY): 8469 SLP Time Calculation (min) (ACUTE ONLY): 25 min Past Medical History: Past Medical History: Diagnosis Date . Arthritis  . Atrial fibrillation ([place])  . Carotid artery disease ([place])   Right carotid endarectomy 1999 . Chronic anticoagulation   Followed by PMD . Coronary atherosclerosis   a. Minor at cardiac catheterization 2005 b. cath 10/16/2014 40% prox LAD dx, otherwise minimal CAD . Depression  . Essential hypertension  . Glucose intolerance (impaired glucose tolerance)  . History of kidney stones  . Hyperlipidemia  . Pneumonia 2010 . Sick sinus syndrome ([facility])   [employer] PPM Past Surgical History: Past Surgical History: Procedure Laterality Date . ABDOMINAL HYSTERECTOMY   . APPENDECTOMY   . BREAST BIOPSY Bilateral   x 7 total . CARDIAC CATHETERIZATION N/A 10/16/2014  Procedure: Left Heart Cath and Coronary Angiography;  Surgeon: Leonie Man, MD;  Location: [facility];  Service: Cardiovascular;  Laterality: N/A; . CAROTID ENDARTERECTOMY Right 1999 . CATARACT EXTRACTION W/ INTRAOCULAR LENS  IMPLANT, BILATERAL Bilateral  . COLONOSCOPY    2006 . CYSTOSCOPY W/ URETERAL STENT PLACEMENT Left 04/19/2012  Procedure: CYSTOSCOPY WITH RETROGRADE PYELOGRAM/URETERAL STENT PLACEMENT ;  Surgeon: Ailene Rud, MD;  Location: [facility];  Service: Urology;  Laterality: Left; . CYSTOSCOPY WITH RETROGRADE PYELOGRAM,  URETEROSCOPY AND STENT PLACEMENT Left 06/30/2012  Procedure: CYSTOSCOPY WITH LEFT  RETROGRADE PYELOGRAM, URETEROSCOPY  with basketing of stone, AND STENT PLACEMENT, TRANSURETHRAL UNROOFING OF URETER.;  Surgeon: Alexis Frock, MD;  Location: [facility];  Service: Urology;  Laterality: Left; . INSERT / REPLACE / REMOVE PACEMAKER  2006 . OPEN REDUCTION INTERNAL FIXATION (ORIF) DISTAL RADIAL FRACTURE Right 04/29/2016  Procedure: OPEN REDUCTION INTERNAL FIXATION (ORIF) DISTAL RADIAL FRACTURE;  Surgeon: Roseanne Kaufman, MD;  Location: [facility];  Service: Orthopedics;  Laterality: Right; . ORIF DISTAL RADIUS FRACTURE Right 04/29/2016 HPI: Pt is an 83 y.o. female admitted with L facial droop. CT showed a R lentiform hemorrhage with mild surrounding edema. PMH includes: dementia (living in ILF), sick sinus syndrome, hyperlipidemia, essential hypertension, CAD, chronic anticoagulation, atrial fibrillation, carotid artery disease, PNA  Subjective: pt alert, pleasant, but confused Assessment / Plan / Recommendation CHL IP CLINICAL IMPRESSIONS 12/11/2017 Clinical Impression Pt has a moderate oral and mild pharyngeal dysphagia with sensorimotor deficits. Orally she has weak labial seal and lingual manipulation, allowing premature spillage with thin liquids, saliva, and even a large piece of unmasticated cracker without pt awareness. She has mild-moderate L buccal pocketing, but can attend to this residue when given Min-Mod cues for lingual sweep. Her timing is mildly impaired for swallow trigger with thin liquids, likely at least in part related to decreased oral containement and/or decreased sensation, although no aspiration occurs. She consistently penetrates, with penetrates getting close to the true vocal folds, but penetrates clear the laryngeal vestibule upon completion of the swallow. Nectar thick liquids were also tested with no penetration observed. Recommend to start wtih Dys 1 diet and thin liquids with full supervision. Should  pt experience any overt difficulty during meals, nectar thick liquids could be considered as a safer alternative. SLP will continue to follow for tolerance and readiness to advance. SLP Visit Diagnosis Dysphagia, oropharyngeal phase (R13.12) Attention and concentration deficit following -- Frontal lobe and executive function deficit following -- Impact on safety and function Mild aspiration risk;Moderate aspiration risk   CHL IP TREATMENT RECOMMENDATION 12/11/2017 Treatment Recommendations Therapy as outlined in treatment plan below   Prognosis 12/11/2017 Prognosis for Safe Diet Advancement Good Barriers to Reach Goals Cognitive deficits Barriers/Prognosis Comment -- CHL IP DIET RECOMMENDATION 12/11/2017 SLP Diet Recommendations Dysphagia 1 (Puree) solids;Thin liquid Liquid Administration via Cup;Straw Medication Administration Crushed with puree Compensations Slow rate;Small sips/bites;Minimize environmental distractions;Lingual sweep for clearance of pocketing;Monitor for anterior loss Postural Changes Seated upright at 90 degrees   CHL IP OTHER RECOMMENDATIONS 12/11/2017 Recommended Consults -- Oral Care Recommendations Oral care BID Other Recommendations Have oral suction available   CHL IP FOLLOW UP RECOMMENDATIONS 12/11/2017 Follow up Recommendations (No Data)   CHL IP FREQUENCY AND DURATION 12/11/2017 Speech Therapy Frequency (ACUTE ONLY) min 2x/week Treatment Duration 2 weeks      CHL IP ORAL PHASE 12/11/2017 Oral Phase Impaired Oral - Pudding Teaspoon -- Oral - Pudding Cup -- Oral - Honey Teaspoon -- Oral - Honey Cup -- Oral - Nectar Teaspoon -- Oral - Nectar Cup -- Oral - Nectar Straw Weak lingual manipulation;Reduced posterior propulsion Oral - Thin Teaspoon --  Oral - Thin Cup Weak lingual manipulation;Reduced posterior propulsion;Left anterior bolus loss Oral - Thin Straw Weak lingual manipulation;Reduced posterior propulsion Oral - Puree Weak lingual manipulation;Reduced posterior propulsion;Left  pocketing in lateral sulci Oral - Mech Soft Weak lingual manipulation;Reduced posterior propulsion;Left pocketing in lateral sulci;Left anterior bolus loss Oral - Regular -- Oral - Multi-Consistency -- Oral - Pill -- Oral Phase - Comment --  CHL IP PHARYNGEAL PHASE 12/11/2017 Pharyngeal Phase Impaired Pharyngeal- Pudding Teaspoon -- Pharyngeal -- Pharyngeal- Pudding Cup -- Pharyngeal -- Pharyngeal- Honey Teaspoon -- Pharyngeal -- Pharyngeal- Honey Cup -- Pharyngeal -- Pharyngeal- Nectar Teaspoon -- Pharyngeal -- Pharyngeal- Nectar Cup -- Pharyngeal -- Pharyngeal- Nectar Straw WFL Pharyngeal -- Pharyngeal- Thin Teaspoon -- Pharyngeal -- Pharyngeal- Thin Cup Penetration/Aspiration before swallow Pharyngeal Material enters airway, remains ABOVE vocal cords and not ejected out Pharyngeal- Thin Straw Penetration/Aspiration before swallow Pharyngeal Material enters airway, remains ABOVE vocal cords and not ejected out Pharyngeal- Puree WFL Pharyngeal -- Pharyngeal- Mechanical Soft WFL Pharyngeal -- Pharyngeal- Regular -- Pharyngeal -- Pharyngeal- Multi-consistency -- Pharyngeal -- Pharyngeal- Pill -- Pharyngeal -- Pharyngeal Comment --  CHL IP CERVICAL ESOPHAGEAL PHASE 12/11/2017 Cervical Esophageal Phase WFL Pudding Teaspoon -- Pudding Cup -- Honey Teaspoon -- Honey Cup -- Nectar Teaspoon -- Nectar Cup -- Nectar Straw -- Thin Teaspoon -- Thin Cup -- Thin Straw -- Puree -- Mechanical Soft -- Regular -- Multi-consistency -- Pill -- Cervical Esophageal Comment -- Germain Osgood 12/11/2017, 5:30 PM  Germain Osgood, M.A. CCC-SLP Acute Rehabilitation Services Pager [phone] Office [phone]             Ct Head Code Stroke Wo Contrast  Result Date: 12/10/2017 CLINICAL DATA:  Code stroke. 83 year old female with sudden onset left side weakness. EXAM: CT HEAD WITHOUT CONTRAST TECHNIQUE: Contiguous axial images were obtained from the base of the skull through the vertex without intravenous contrast. COMPARISON:   Head CT without contrast 11/24/2017 and earlier. FINDINGS: Brain: There is an oval or diamond-shaped hyperdense hemorrhage in the right lentiform and Corona radiata encompassing 19 x 17 x 25 millimeters (AP by transverse by CC) for an estimated blood volume of 4 milliliters. Mild surrounding edema. Minor regional mass effect. No extension into the ventricles or outside of the brain parenchyma. Superimposed confluent bilateral cerebral white matter hypodensity and deep gray matter heterogeneity. Cavum septum pellucidum, normal variant. No acute cortically based infarct identified. 12-13 millimeter calcified right tentorial meningioma re-demonstrated, and was 10 millimeters in 2012. Vascular: Calcified atherosclerosis at the skull base. No suspicious intracranial vascular hyperdensity. The Skull: Stable and intact. Sinuses/Orbits: Continued left OMC obstructive pattern sinus disease. Stable sinus and mastoid aeration. Other: Partially resolved left scalp hematoma since October. No new scalp or orbits soft tissue abnormality. ASPECTS [facility] Stroke Program Early CT Score) Total score (0-10 with 10 being normal): Not applicable, acute hemorrhage. IMPRESSION: 1. Small acute right lentiform hemorrhage with estimated blood volume of 4 mL. Mild surrounding edema. No ventricular or extra-axial extension. 2. Critical Value/emergent results were called by telephone at the time of interpretation on 12/10/2017 at 7:03 am to Dr. Ripley Fraise , who verbally acknowledged these results. 3. Underlying chronic small vessel disease. Partially resolved left scalp hematoma since October. Small chronic right tentorial meningioma. Electronically Signed   By: Genevie Ann M.D.   On: 12/10/2017 07:04    Assessment/Plan  History of hypertension-she continues on losartan 50 mg twice daily in addition to Lopressor 50 mg twice a day and diltiazem 240 mg daily.--It appears with medication her  blood pressure comes down significantly at this  point will monitor goal is to keep systolic blood pressure of 140 or below Continue to monitor readings- every shift    2.  History of intracerebral hemorrhage- not on Coumadin or aspirin at this secondary to the bleeding-does have follow-up with neurology for follow-up of anticoagulation she has started therapy.  3.  History of type 2 diabetes low-dose Glucotrol has been restarted blood sugars so far appear to be stable in the mid 100s.  4.  History of chronic diastolic CHF she is on Lasix her weight appears to be stable clinically appears to be compensated at this point continue to monitor metabolic panel yesterday showed stability as well.  5.  History of atrial fibrillation continues on Metroprolol and not on anticoagulation because of recent [place] again neurology will be following up on this.    6 history of dementia with agitation -- is on Seroquel which was started in the hospital as well as Effexor- per family her dementia has been somewhat progressive-   [id number]

## 2017-12-21 ENCOUNTER — Non-Acute Institutional Stay (SKILLED_NURSING_FACILITY): Payer: PPO | Admitting: Internal Medicine

## 2017-12-21 ENCOUNTER — Encounter: Payer: Self-pay | Admitting: Internal Medicine

## 2017-12-21 DIAGNOSIS — R600 Localized edema: Secondary | ICD-10-CM

## 2017-12-21 NOTE — Progress Notes (Signed)
Location:    [place] Room Number: 103/P Place of Service:  SNF [phone]) Provider:  Cory Roughen, MD  Patient Care Team: Celene Squibb, MD as PCP - General (Internal Medicine) Evans Lance, MD as Consulting Physician (Cardiology)  Extended Emergency Contact Information Primary Emergency Contact: Raylene Everts Address: [address], [place] [address] [place] Phone: [phone] Mobile Phone: [phone] Relation: Daughter Secondary Emergency Contact: Benjamine Mola [place] Phone: [phone] Relation: Granddaughter  Code Status:  DNR Goals of care: Advanced Directive information Advanced Directives 12/21/2017  Does Patient Have a Medical Advance Directive? Yes  Type of Advance Directive Out of facility DNR (pink MOST or yellow form)  Does patient want to make changes to medical advance directive? No - Patient declined  Copy of [place] in Chart? -  Would patient like information on creating a medical advance directive? No - Patient declined  Pre-existing out of facility DNR order (yellow form or pink MOST form) -     Chief Complaint  Patient presents with  . Acute Visit    Swollen left thumb    HPI:  Pt is a 83 y.o. female seen today for an acute visit for very localized swelling at the distal joint of her left thumb--it is slightly erythematous and minimally tender.  Patient is here for rehab after sustaining a CVA- she has a history of chronic A. fib had been on Coumadin no longer on this secondary to the CVA with history of intra-cranial bleed.  She also has a history of coronary artery disease peripheral vascular disease hypertension type 2 diabetes and dementia.  She does have a pacemaker with a history of sick sinus syndrome.  She denies any trauma to the thumb- apparently this is been present since her hospitalization there has been no change or  increased erythema her other joints do not appear to be affected    Past Medical History:  Diagnosis Date  . Arthritis   . Atrial fibrillation ([place])   . Carotid artery disease ([place])    Right carotid endarectomy 1999  . Chronic anticoagulation    Followed by PMD  . Coronary atherosclerosis    a. Minor at cardiac catheterization 2005 b. cath 10/16/2014 40% prox LAD dx, otherwise minimal CAD  . Depression   . Essential hypertension   . Glucose intolerance (impaired glucose tolerance)   . History of kidney stones   . Hyperlipidemia   . Pneumonia 2010  . Sick sinus syndrome [facility])    [employer] PPM   Past Surgical History:  Procedure Laterality Date  . ABDOMINAL HYSTERECTOMY    . APPENDECTOMY    . BREAST BIOPSY Bilateral    x 7 total  . CARDIAC CATHETERIZATION N/A 10/16/2014   Procedure: Left Heart Cath and Coronary Angiography;  Surgeon: Leonie Man, MD;  Location: [facility];  Service: Cardiovascular;  Laterality: N/A;  . CAROTID ENDARTERECTOMY Right 1999  . CATARACT EXTRACTION W/ INTRAOCULAR LENS  IMPLANT, BILATERAL Bilateral   . COLONOSCOPY     2006  . CYSTOSCOPY W/ URETERAL STENT PLACEMENT Left 04/19/2012   Procedure: CYSTOSCOPY WITH RETROGRADE PYELOGRAM/URETERAL STENT PLACEMENT ;  Surgeon: Ailene Rud, MD;  Location: [facility];  Service: Urology;  Laterality: Left;  . CYSTOSCOPY WITH RETROGRADE PYELOGRAM, URETEROSCOPY AND STENT PLACEMENT Left 06/30/2012   Procedure: CYSTOSCOPY WITH LEFT  RETROGRADE PYELOGRAM,  URETEROSCOPY  with basketing of stone, AND STENT PLACEMENT, TRANSURETHRAL UNROOFING OF URETER.;  Surgeon: Alexis Frock, MD;  Location: [facility];  Service: Urology;  Laterality: Left;  . INSERT / REPLACE / REMOVE PACEMAKER  2006  . OPEN REDUCTION INTERNAL FIXATION (ORIF) DISTAL RADIAL FRACTURE Right 04/29/2016   Procedure: OPEN REDUCTION INTERNAL FIXATION (ORIF) DISTAL RADIAL FRACTURE;  Surgeon: Roseanne Kaufman, MD;  Location: [facility];  Service: Orthopedics;   Laterality: Right;  . ORIF DISTAL RADIUS FRACTURE Right 04/29/2016    Allergies  Allergen Reactions  . Morphine Nausea Only  . Penicillins Other (See Comments)    Has patient had a PCN reaction causing immediate rash, facial/tongue/throat swelling, SOB or lightheadedness with hypotension: NO Has patient had a PCN reaction causing severe rash involving mucus membranes or skin necrosis: no Has patient had a PCN reaction that required hospitalization: NO Has patient had a PCN reaction occurring within the last 10 years: NO If all of the above answers are "NO", then may proceed with Cephalosporin use.     Outpatient Encounter Medications as of 12/21/2017  Medication Sig  . acetaminophen (TYLENOL) 325 MG tablet Take 2 tablets (650 mg total) by mouth every 6 (six) hours as needed for mild pain (or temp > 37.5 C (99.5 F)).  Marland Kitchen diltiazem (TIAZAC) 240 MG 24 hr capsule Take 240 mg by mouth daily.  Marland Kitchen docusate sodium (COLACE) 100 MG capsule Take 100 mg by mouth daily.  . furosemide (LASIX) 40 MG tablet Take 1 tablet (40 mg total) by mouth daily.  Marland Kitchen glipiZIDE (GLUCOTROL) 5 MG tablet Take 2.5 mg by mouth daily before breakfast.  . losartan (COZAAR) 50 MG tablet Take 1 tablet (50 mg total) by mouth 2 (two) times daily. In the morning  . Melatonin 3 MG TABS Take 1 tablet (3 mg total) by mouth at bedtime as needed (SLEEP).  . metoprolol tartrate (LOPRESSOR) 50 MG tablet Take 50 mg by mouth 2 (two) times daily.  . NYSTATIN PO Swab mouth three times a day with nystatin times 5 days for thrush  . omeprazole (PRILOSEC) 20 MG capsule Take 20 mg by mouth daily.  . potassium chloride (K-DUR,KLOR-CON) 10 MEQ tablet Take 20 mEq by mouth daily.  . pravastatin (PRAVACHOL) 20 MG tablet Take 40 mg by mouth daily.   . QUEtiapine (SEROQUEL) 25 MG tablet Take 0.5 tablets (12.5 mg total) by mouth at bedtime.  Marland Kitchen venlafaxine (EFFEXOR) 100 MG tablet Take 100 mg by mouth daily.   . [DISCONTINUED] Maltodextrin-Xanthan Gum  (RESOURCE THICKENUP CLEAR) POWD Use prn liqiuids   No facility-administered encounter medications on file as of 12/21/2017.     Review of Systems limited secondary to dementia  General she does not complain of fever chills.  Skin again does have the very localized erythema left thumb joint does not complain of rashes or itching.  Respiratory not complain of shortness of breath and cough.  Cardiac does not complain of chest pain.   Musculoskeletal is not really complaining of joint pain at this time  Neurologic is not complaining of dizziness or headache does have some residual left-sided weakness upper extremity  Immunization History  Administered Date(s) Administered  . Influenza,inj,Quad PF,6+ Mos 10/14/2014  . Pneumococcal Conjugate-13 04/06/2013   Pertinent  Health Maintenance Due  Topic Date Due  . INFLUENZA VACCINE  01/15/2018 (Originally 08/27/2017)  . FOOT EXAM  01/15/2018 (Originally 09/10/1944)  . OPHTHALMOLOGY EXAM  01/15/2018 (Originally 09/10/1944)  . PNA vac Low Risk Adult (2 of  2 - PPSV23) 01/15/2018 (Originally 04/07/2014)  . URINE MICROALBUMIN  01/16/2018 (Originally 09/10/1944)  . HEMOGLOBIN A1C  06/12/2018  . DEXA SCAN  Completed   Fall Risk  04/06/2013  Falls in the past year? No   Functional Status Survey:    Vitals:   12/21/17 1617  BP: 130/74  Pulse: 95  Resp: 19  Temp: (!) 97.1 F (36.2 C)  TempSrc: Oral  Weight: 162 lb 9.6 oz (73.8 kg)   Body mass index is 27.06 kg/m. Physical Exam   General this is a pleasant elderly female in no distress.  Her skin is warm and dry.  Musculoskeletal left thumb there is a very localized area of mild edema and erythema of the distal joint of her left thumb-this is very localized does not go around the thumb there is some minimal tenderness to palpation.  I could not appreciate any similar presentation of the joints on her other fingers or other hand.  Neurologic again continues some left upper arm  weakness otherwise moves her extremities at baseline.  Psych she is oriented to self pleasant appropriate continues to really want to go home Labs reviewed: Recent Labs    12/10/17 2336  12/15/17 0520 12/15/2017 0426 12/17/17 0700  NA 139   < > 140 140 140  K 3.6   < > 3.0* 3.4* 3.6  CL 105   < > 105 102 104  CO2 27   < > 27 27 27   GLUCOSE 170*   < > 164* 173* 161*  BUN 16   < > 14 20 14   CREATININE 0.94   < > 0.98 1.02* 0.73  CALCIUM 8.7*   < > 9.1 9.5 9.1  MG 1.8  --  1.8  --   --    < > = values in this interval not displayed.   Recent Labs    11/24/17 1712 12/10/17 0701  AST 20 21  ALT 16 17  ALKPHOS 100 86  BILITOT 0.6 0.7  PROT 7.4 7.5  ALBUMIN 3.6 3.6   Recent Labs    12/10/17 0701  12/14/17 0454 12/15/17 0520 12/19/2017 0426 12/17/17 0700  WBC 10.5   < > 10.9* 11.1* 13.1* 11.2*  NEUTROABS 6.4  --  7.4  --   --  7.1  HGB 14.9   < > 11.7* 12.2 12.5 12.3  HCT 45.1   < > 36.5 37.1 38.6 38.4  MCV 95.6   < > 99.5 96.9 97.2 98.2  PLT 280   < > 179 234 234 252   < > = values in this interval not displayed.   Lab Results  Component Value Date   TSH 2.115 12/16/2013   Lab Results  Component Value Date   HGBA1C 7.9 (H) 12/12/2017   Lab Results  Component Value Date   CHOL 170 12/12/2017   HDL 33 (L) 12/12/2017   LDLCALC 107 (H) 12/12/2017   TRIG 148 12/12/2017   CHOLHDL 5.2 12/12/2017    Significant Diagnostic Results in last 30 days:  Ct Angio Head W Or Wo Contrast  Result Date: 12/11/2017 CLINICAL DATA:  Follow-up examination for known intracranial hemorrhage. Patient with right lentiform intraparenchymal hematoma. EXAM: CT ANGIOGRAPHY HEAD AND NECK TECHNIQUE: Multidetector CT imaging of the head and neck was performed using the standard protocol during bolus administration of intravenous contrast. Multiplanar CT image reconstructions and MIPs were obtained to evaluate the vascular anatomy. Carotid stenosis measurements (when applicable) are obtained  utilizing NASCET criteria,  using the distal internal carotid diameter as the denominator. CONTRAST:  84mL ISOVUE-370 IOPAMIDOL (ISOVUE-370) INJECTION 76% COMPARISON:  Prior CT from 12/10/2017 as well as earlier studies. FINDINGS: CTA NECK FINDINGS Aortic arch: Visualized aortic arch of normal caliber with normal branch pattern. Moderate atherosclerotic change about the aortic arch and origin of the great vessels without hemodynamically significant stenosis. Visualized subclavian arteries widely patent. Right carotid system: Scattered non stenotic plaque within the right common carotid artery without significant narrowing. Concentric calcified plaque about the right bifurcation/proximal right ICA without hemodynamically significant stenosis. Probable remote changes related to prior endarterectomy noted. Right ICA patent from the bifurcation to the skull base without stenosis, dissection, or occlusion. Left carotid system: Left common carotid artery patent from its origin to the bifurcation without significant stenosis. Left common carotid artery medialized into the retropharyngeal space. Scattered a centric calcified plaque about the left bifurcation/proximal left ICA with associated stenosis of up to approximately 50% by NASCET criteria. Left ICA widely patent distally to the skull base without stenosis, dissection, or occlusion. Vertebral arteries: Both of the vertebral arteries arise from the subclavian arteries. The left vertebral artery appears to be slightly dominant. Left vertebral artery essentially occludes just beyond its origin, and remains occluded to approximately the level of C4-5. Distal reconstitution likely via muscular branches. Irregular attenuated flow seen distally within the left vertebral artery which is otherwise patent to the skull base. The right vertebral artery is occluded at its origin. Irregular distal reconstitution at approximately the level of C5-6 (series 8, image 231). Irregular  thready and attenuated flow seen distally throughout the right ICA with multifocal moderate to severe segmental stenoses. Right vertebral artery is nearly occluded as it reaches the cranial vault (series 8, image 165). Skeleton: No acute osseus abnormality. No discrete lytic or blastic osseous lesions. Moderate cervical spondylolysis noted at C4-5 through C6-7. Other neck: No acute soft tissue abnormality within the neck. Chronic left maxillary sinusitis noted. Salivary glands within normal limits. No adenopathy. Thyroid within normal limits. Upper chest: Streak artifact from left-sided pacemaker/AICD. Left-sided pleural effusion with associated atelectasis partially visualized. Additional scattered atelectatic changes noted within the visualized lungs. Review of the MIP images confirms the above findings CTA HEAD FINDINGS Anterior circulation: Petrous segments widely patent bilaterally. Advanced atheromatous plaque within the cavernous/supraclinoid ICAs with moderate multifocal narrowing. ICA termini widely patent. Left A1 segment irregular but patent without high-grade stenosis. Severe diffuse stenosis of the right A1 segment. Normal anterior communicating artery. Extensive atheromatous irregularity throughout the ACAs, left greater than right were there are multifocal moderate to severe stenoses. Left M1 irregular but widely patent without high-grade stenosis. Focal moderate proximal right M1 stenosis noted (series 8, image 92). Normal MCA bifurcations. No proximal M2 occlusion. Extensive small vessel atheromatous irregularity throughout the MCA branches bilaterally which are well perfused and fairly symmetric. Posterior circulation: Multifocal atheromatous irregularity within the dominant left V4 segment which is patent to the vertebrobasilar junction without high-grade stenosis. Patent left PICA. Diminutive right vertebral artery nearly occluded at the skull base, with scant irregular flow seen within the right  V4 segment. Right V4 occludes prior to the vertebrobasilar junction. Right PICA of not seen. Basilar artery somewhat diminutive. Severe atheromatous change throughout the mid and distal basilar artery with multifocal severe stenoses. Anterior inferior cerebral arteries patent bilaterally. Superior cerebral arteries grossly patent. Predominant fetal type origin of the PCAs with patent and robust posterior communicating arteries. PCAs are patent to their distal aspects with multifocal moderate to severe  distal P3 and P4 stenoses. Venous sinuses: Not well assessed due to arterial timing of the contrast bolus. Anatomic variants: Fetal type origin of the PCAs with underlying diminutive vertebrobasilar system. Focal outpouching at the cavernous left ICA in the region of the left hypophyseal artery favored to reflect a normal vascular infundibulum. No AVM or other vascular abnormality seen underlying the right lentiform hemorrhage. Delayed phase: No abnormal enhancement. Right lentiform nucleus hemorrhage relatively stable measuring 13 x 14 mm. No intraventricular extension. Review of the MIP images confirms the above findings IMPRESSION: 1. No significant interval change in size and appearance right lenticular hematoma. No underlying vascular abnormality identified. 2. Severe vertebrobasilar atherosclerotic disease as above. Proximal-mid vertebral arteries are occluded within the neck with distal reconstitution. Right vertebral subsequently occludes at the skull base. Multifocal severe stenoses seen throughout the basilar artery. Overall, vertebrobasilar system is diminutive with fetal type origin of the PCAs. 3. Approximate 50% atheromatous stenosis at the left carotid bifurcation. 4. Sequelae of remote right carotid endarterectomy, with no significant residual or recurrent stenosis. 5. Additional extensive atherosclerotic change throughout the intracranial circulation as above. No large vessel occlusion. Electronically  Signed   By: Jeannine Boga M.D.   On: 12/11/2017 17:12   Dg Chest 2 View  Result Date: 11/24/2017 CLINICAL DATA:  Headache EXAM: CHEST - 2 VIEW COMPARISON:  February 07, 2016 FINDINGS: The heart size and mediastinal contours are stable. The heart size is enlarged. Cardiac pacemaker is unchanged. There is a small left pleural effusion. No focal pneumonia or pulmonary edema is identified. The visualized skeletal structures are stable. IMPRESSION: Small left pleural effusion.  Cardiomegaly. Electronically Signed   By: Abelardo Diesel M.D.   On: 11/24/2017 18:25   Dg Pelvis 1-2 Views  Result Date: 11/24/2017 CLINICAL DATA:  Fall EXAM: PELVIS - 1-2 VIEW COMPARISON:  Pelvic radiograph 04/20/2014 FINDINGS: There are old fractures of the right superior and inferior pubic rami. No acute fracture. Both hips are approximated. IMPRESSION: No acute abnormality Electronically Signed   By: Ulyses Jarred M.D.   On: 11/24/2017 18:27   Ct Head Wo Contrast  Result Date: 12/10/2017 CLINICAL DATA:  Intracerebral hemorrhage follow-up EXAM: CT HEAD WITHOUT CONTRAST TECHNIQUE: Contiguous axial images were obtained from the base of the skull through the vertex without intravenous contrast. COMPARISON:  CT head 12/10/2017 FINDINGS: Brain: Right posterior basal ganglia and deep white matter hematoma is unchanged in size measuring 13 x 13 by 26 mm. No intraventricular extension. No mass-effect or midline shift. Moderate atrophy. Ventricular enlargement due to atrophy is unchanged. Extensive chronic microvascular ischemic changes in the white matter. Densely calcified mass along the right tentorium projecting inferiorly is unchanged measuring 12 mm compatible with meningioma. Vascular: Extensive atherosclerotic calcification. Negative for hyperdense vessel Skull: Negative Sinuses/Orbits: Extensive mucosal edema left maxillary ethmoid and frontal sinus unchanged. Calcified secretions left ethmoid sinus. Other: None IMPRESSION:  Right lenticular hematoma unchanged from earlier today. No intraventricular hemorrhage or midline shift. Electronically Signed   By: Franchot Gallo M.D.   On: 12/10/2017 19:19   Ct Head Wo Contrast  Result Date: 11/24/2017 CLINICAL DATA:  Fall with headache EXAM: CT HEAD WITHOUT CONTRAST CT CERVICAL SPINE WITHOUT CONTRAST TECHNIQUE: Multidetector CT imaging of the head and cervical spine was performed following the standard protocol without intravenous contrast. Multiplanar CT image reconstructions of the cervical spine were also generated. COMPARISON:  Head CT 05/20/2016 FINDINGS: CT HEAD FINDINGS Brain: There is no mass, hemorrhage or extra-axial collection. There is generalized  atrophy without lobar predilection. There is hypoattenuation of the periventricular white matter, most commonly indicating chronic ischemic microangiopathy. Old left basal ganglia lacunar infarct. Vascular: Atherosclerotic calcification of the internal carotid arteries at the skull base. No abnormal hyperdensity of the major intracranial arteries or dural venous sinuses. Skull: Large left frontal scalp hematoma.  No underlying fracture. Sinuses/Orbits: No fluid levels or advanced mucosal thickening of the visualized paranasal sinuses. No mastoid or middle ear effusion. The orbits are normal. CT CERVICAL SPINE FINDINGS Alignment: No static subluxation. Facets are aligned. Occipital condyles are normally positioned. Skull base and vertebrae: No acute fracture. Soft tissues and spinal canal: No prevertebral fluid or swelling. No visible canal hematoma. Disc levels: Multilevel degenerative disc disease and mild facet hypertrophy. No bony spinal canal stenosis. Upper chest: No pneumothorax, pulmonary nodule or pleural effusion. Other: Normal visualized paraspinal cervical soft tissues. IMPRESSION: 1. Left frontal scalp hematoma without calvarial fracture or acute intracranial abnormality. 2. Chronic ischemic microangiopathy and generalized  volume loss. 3. No acute abnormality of the cervical spine. Electronically Signed   By: Ulyses Jarred M.D.   On: 11/24/2017 18:39   Ct Angio Neck W Or Wo Contrast  Result Date: 12/11/2017 CLINICAL DATA:  Follow-up examination for known intracranial hemorrhage. Patient with right lentiform intraparenchymal hematoma. EXAM: CT ANGIOGRAPHY HEAD AND NECK TECHNIQUE: Multidetector CT imaging of the head and neck was performed using the standard protocol during bolus administration of intravenous contrast. Multiplanar CT image reconstructions and MIPs were obtained to evaluate the vascular anatomy. Carotid stenosis measurements (when applicable) are obtained utilizing NASCET criteria, using the distal internal carotid diameter as the denominator. CONTRAST:  19mL ISOVUE-370 IOPAMIDOL (ISOVUE-370) INJECTION 76% COMPARISON:  Prior CT from 12/10/2017 as well as earlier studies. FINDINGS: CTA NECK FINDINGS Aortic arch: Visualized aortic arch of normal caliber with normal branch pattern. Moderate atherosclerotic change about the aortic arch and origin of the great vessels without hemodynamically significant stenosis. Visualized subclavian arteries widely patent. Right carotid system: Scattered non stenotic plaque within the right common carotid artery without significant narrowing. Concentric calcified plaque about the right bifurcation/proximal right ICA without hemodynamically significant stenosis. Probable remote changes related to prior endarterectomy noted. Right ICA patent from the bifurcation to the skull base without stenosis, dissection, or occlusion. Left carotid system: Left common carotid artery patent from its origin to the bifurcation without significant stenosis. Left common carotid artery medialized into the retropharyngeal space. Scattered a centric calcified plaque about the left bifurcation/proximal left ICA with associated stenosis of up to approximately 50% by NASCET criteria. Left ICA widely patent  distally to the skull base without stenosis, dissection, or occlusion. Vertebral arteries: Both of the vertebral arteries arise from the subclavian arteries. The left vertebral artery appears to be slightly dominant. Left vertebral artery essentially occludes just beyond its origin, and remains occluded to approximately the level of C4-5. Distal reconstitution likely via muscular branches. Irregular attenuated flow seen distally within the left vertebral artery which is otherwise patent to the skull base. The right vertebral artery is occluded at its origin. Irregular distal reconstitution at approximately the level of C5-6 (series 8, image 231). Irregular thready and attenuated flow seen distally throughout the right ICA with multifocal moderate to severe segmental stenoses. Right vertebral artery is nearly occluded as it reaches the cranial vault (series 8, image 165). Skeleton: No acute osseus abnormality. No discrete lytic or blastic osseous lesions. Moderate cervical spondylolysis noted at C4-5 through C6-7. Other neck: No acute soft tissue abnormality within the  neck. Chronic left maxillary sinusitis noted. Salivary glands within normal limits. No adenopathy. Thyroid within normal limits. Upper chest: Streak artifact from left-sided pacemaker/AICD. Left-sided pleural effusion with associated atelectasis partially visualized. Additional scattered atelectatic changes noted within the visualized lungs. Review of the MIP images confirms the above findings CTA HEAD FINDINGS Anterior circulation: Petrous segments widely patent bilaterally. Advanced atheromatous plaque within the cavernous/supraclinoid ICAs with moderate multifocal narrowing. ICA termini widely patent. Left A1 segment irregular but patent without high-grade stenosis. Severe diffuse stenosis of the right A1 segment. Normal anterior communicating artery. Extensive atheromatous irregularity throughout the ACAs, left greater than right were there are  multifocal moderate to severe stenoses. Left M1 irregular but widely patent without high-grade stenosis. Focal moderate proximal right M1 stenosis noted (series 8, image 92). Normal MCA bifurcations. No proximal M2 occlusion. Extensive small vessel atheromatous irregularity throughout the MCA branches bilaterally which are well perfused and fairly symmetric. Posterior circulation: Multifocal atheromatous irregularity within the dominant left V4 segment which is patent to the vertebrobasilar junction without high-grade stenosis. Patent left PICA. Diminutive right vertebral artery nearly occluded at the skull base, with scant irregular flow seen within the right V4 segment. Right V4 occludes prior to the vertebrobasilar junction. Right PICA of not seen. Basilar artery somewhat diminutive. Severe atheromatous change throughout the mid and distal basilar artery with multifocal severe stenoses. Anterior inferior cerebral arteries patent bilaterally. Superior cerebral arteries grossly patent. Predominant fetal type origin of the PCAs with patent and robust posterior communicating arteries. PCAs are patent to their distal aspects with multifocal moderate to severe distal P3 and P4 stenoses. Venous sinuses: Not well assessed due to arterial timing of the contrast bolus. Anatomic variants: Fetal type origin of the PCAs with underlying diminutive vertebrobasilar system. Focal outpouching at the cavernous left ICA in the region of the left hypophyseal artery favored to reflect a normal vascular infundibulum. No AVM or other vascular abnormality seen underlying the right lentiform hemorrhage. Delayed phase: No abnormal enhancement. Right lentiform nucleus hemorrhage relatively stable measuring 13 x 14 mm. No intraventricular extension. Review of the MIP images confirms the above findings IMPRESSION: 1. No significant interval change in size and appearance right lenticular hematoma. No underlying vascular abnormality identified.  2. Severe vertebrobasilar atherosclerotic disease as above. Proximal-mid vertebral arteries are occluded within the neck with distal reconstitution. Right vertebral subsequently occludes at the skull base. Multifocal severe stenoses seen throughout the basilar artery. Overall, vertebrobasilar system is diminutive with fetal type origin of the PCAs. 3. Approximate 50% atheromatous stenosis at the left carotid bifurcation. 4. Sequelae of remote right carotid endarterectomy, with no significant residual or recurrent stenosis. 5. Additional extensive atherosclerotic change throughout the intracranial circulation as above. No large vessel occlusion. Electronically Signed   By: Jeannine Boga M.D.   On: 12/11/2017 17:12   Ct Chest Wo Contrast  Result Date: 12/13/2017 CLINICAL DATA:  Rales on auscultation. EXAM: CT CHEST WITHOUT CONTRAST TECHNIQUE: Multidetector CT imaging of the chest was performed following the standard protocol without IV contrast. COMPARISON:  08/19/2011. Portable chest obtained earlier today. FINDINGS: Cardiovascular: Enlarged heart due to left atrial and left ventricular enlargement. Atheromatous calcifications, including the coronary arteries and aorta. Small pericardial effusion with a maximum thickness of 8 mm. Left subclavian pacemaker leads in satisfactory position. Mediastinum/Nodes: No enlarged mediastinal or axillary lymph nodes. Thyroid gland, trachea, and esophagus demonstrate no significant findings. Lungs/Pleura: Small bilateral pleural effusions. Mild bilateral dependent atelectasis. Prominent pulmonary vasculature and interstitial markings. Upper Abdomen: Dense retained  barium in the colon with associated streak artifacts. Musculoskeletal: Old, healed right rib fractures. Thoracic and lower cervical spine degenerative changes. IMPRESSION: 1. Changes of acute congestive heart failure with small bilateral pleural effusions and bilateral dependent atelectasis. 2.  Calcific  coronary artery and aortic atherosclerosis. Aortic Atherosclerosis (ICD10-I70.0). Electronically Signed   By: Claudie Revering M.D.   On: 12/13/2017 17:02   Ct Cervical Spine Wo Contrast  Result Date: 11/24/2017 CLINICAL DATA:  Fall with headache EXAM: CT HEAD WITHOUT CONTRAST CT CERVICAL SPINE WITHOUT CONTRAST TECHNIQUE: Multidetector CT imaging of the head and cervical spine was performed following the standard protocol without intravenous contrast. Multiplanar CT image reconstructions of the cervical spine were also generated. COMPARISON:  Head CT 05/20/2016 FINDINGS: CT HEAD FINDINGS Brain: There is no mass, hemorrhage or extra-axial collection. There is generalized atrophy without lobar predilection. There is hypoattenuation of the periventricular white matter, most commonly indicating chronic ischemic microangiopathy. Old left basal ganglia lacunar infarct. Vascular: Atherosclerotic calcification of the internal carotid arteries at the skull base. No abnormal hyperdensity of the major intracranial arteries or dural venous sinuses. Skull: Large left frontal scalp hematoma.  No underlying fracture. Sinuses/Orbits: No fluid levels or advanced mucosal thickening of the visualized paranasal sinuses. No mastoid or middle ear effusion. The orbits are normal. CT CERVICAL SPINE FINDINGS Alignment: No static subluxation. Facets are aligned. Occipital condyles are normally positioned. Skull base and vertebrae: No acute fracture. Soft tissues and spinal canal: No prevertebral fluid or swelling. No visible canal hematoma. Disc levels: Multilevel degenerative disc disease and mild facet hypertrophy. No bony spinal canal stenosis. Upper chest: No pneumothorax, pulmonary nodule or pleural effusion. Other: Normal visualized paraspinal cervical soft tissues. IMPRESSION: 1. Left frontal scalp hematoma without calvarial fracture or acute intracranial abnormality. 2. Chronic ischemic microangiopathy and generalized volume loss.  3. No acute abnormality of the cervical spine. Electronically Signed   By: Ulyses Jarred M.D.   On: 11/24/2017 18:39   Dg Chest Port 1 View  Result Date: 12/14/2017 CLINICAL DATA:  Pleural effusions and atelectasis EXAM: PORTABLE CHEST 1 VIEW COMPARISON:  12/13/2017 FINDINGS: Stable cardiomegaly. Improvement in the perihilar and basilar edema pattern with residual pleural effusions and compressive basilar atelectasis, worse on the left. Upper lobes remain clear. No pneumothorax. Trachea is midline. Aorta is atherosclerotic. Left subclavian pacer noted. IMPRESSION: Improving CHF pattern with persistent cardiomegaly. Residual bilateral pleural effusions and basilar atelectasis/consolidation worse on the left. Electronically Signed   By: Jerilynn Mages.  Shick M.D.   On: 12/14/2017 08:15   Dg Chest Port 1 View  Result Date: 12/13/2017 CLINICAL DATA:  Shortness of breath EXAM: PORTABLE CHEST 1 VIEW COMPARISON:  12/11/2017 FINDINGS: Left chest wall pacemaker is unchanged. There is moderate cardiomegaly with calcific aortic atherosclerosis. Consolidation at the left lung base with small pleural effusion has slightly worsened from the prior study. There is worsened aeration of the right lower lobe. IMPRESSION: 1. Worsening left basilar consolidation and small left pleural effusion. 2. Worsening aeration of the right lower lobe. Electronically Signed   By: Ulyses Jarred M.D.   On: 12/13/2017 03:26   Dg Chest Port 1 View  Result Date: 12/11/2017 CLINICAL DATA:  Shortness of Breath EXAM: PORTABLE CHEST 1 VIEW COMPARISON:  December 10, 2017 FINDINGS: There is airspace opacity in the left lower lobe with left pleural effusion. The right lung is clear. There is cardiomegaly with pulmonary vascularity normal. There is aortic atherosclerosis. Pacemaker leads are attached to the right atrium and right ventricle. Bones  appear osteoporotic. There are surgical clips in the lower right neck region. IMPRESSION: Consolidation left  lower lobe with small left pleural effusion. Right lung clear. There is stable cardiomegaly with aortic atherosclerosis. Pacemaker leads attached to right atrium and right ventricle. Bones osteoporotic. Aortic Atherosclerosis (ICD10-I70.0). Electronically Signed   By: Lowella Grip III M.D.   On: 12/11/2017 07:58   Dg Chest Portable 1 View  Result Date: 12/10/2017 CLINICAL DATA:  Shortness of breath.  Code stroke. EXAM: PORTABLE CHEST 1 VIEW COMPARISON:  11/24/2017. FINDINGS: Cardiac pacer noted with lead tips in right atrium right ventricle. Cardiomegaly with diffuse bilateral from interstitial prominence and left-sided pleural effusion. Findings consistent CHF. No pneumothorax. No acute bony abnormality IMPRESSION: Cardiac pacer with lead tips over the right atrium right ventricle. Cardiomegaly with diffuse bilateral from interstitial prominence and small left pleural effusion consistent with CHF. Electronically Signed   By: Marcello Moores  Register   On: 12/10/2017 08:26   Dg Swallowing Func-speech Pathology  Result Date: 12/11/2017 Objective Swallowing Evaluation: Type of Study: MBS-Modified Barium Swallow Study  Patient Details Name: SARON VANORMAN MRN: [id number] Date of Birth: 1934/06/01 Today's Date: 12/11/2017 Time: SLP Start Time (ACUTE ONLY): 1607 -SLP Stop Time (ACUTE ONLY): 3710 SLP Time Calculation (min) (ACUTE ONLY): 25 min Past Medical History: Past Medical History: Diagnosis Date . Arthritis  . Atrial fibrillation ([place])  . Carotid artery disease ([place])   Right carotid endarectomy 1999 . Chronic anticoagulation   Followed by PMD . Coronary atherosclerosis   a. Minor at cardiac catheterization 2005 b. cath 10/16/2014 40% prox LAD dx, otherwise minimal CAD . Depression  . Essential hypertension  . Glucose intolerance (impaired glucose tolerance)  . History of kidney stones  . Hyperlipidemia  . Pneumonia 2010 . Sick sinus syndrome ([facility])   [employer] PPM Past Surgical History: Past Surgical History:  Procedure Laterality Date . ABDOMINAL HYSTERECTOMY   . APPENDECTOMY   . BREAST BIOPSY Bilateral   x 7 total . CARDIAC CATHETERIZATION N/A 10/16/2014  Procedure: Left Heart Cath and Coronary Angiography;  Surgeon: Leonie Man, MD;  Location: [facility];  Service: Cardiovascular;  Laterality: N/A; . CAROTID ENDARTERECTOMY Right 1999 . CATARACT EXTRACTION W/ INTRAOCULAR LENS  IMPLANT, BILATERAL Bilateral  . COLONOSCOPY    2006 . CYSTOSCOPY W/ URETERAL STENT PLACEMENT Left 04/19/2012  Procedure: CYSTOSCOPY WITH RETROGRADE PYELOGRAM/URETERAL STENT PLACEMENT ;  Surgeon: Ailene Rud, MD;  Location: [facility];  Service: Urology;  Laterality: Left; . CYSTOSCOPY WITH RETROGRADE PYELOGRAM, URETEROSCOPY AND STENT PLACEMENT Left 06/30/2012  Procedure: CYSTOSCOPY WITH LEFT  RETROGRADE PYELOGRAM, URETEROSCOPY  with basketing of stone, AND STENT PLACEMENT, TRANSURETHRAL UNROOFING OF URETER.;  Surgeon: Alexis Frock, MD;  Location: [facility];  Service: Urology;  Laterality: Left; . INSERT / REPLACE / REMOVE PACEMAKER  2006 . OPEN REDUCTION INTERNAL FIXATION (ORIF) DISTAL RADIAL FRACTURE Right 04/29/2016  Procedure: OPEN REDUCTION INTERNAL FIXATION (ORIF) DISTAL RADIAL FRACTURE;  Surgeon: Roseanne Kaufman, MD;  Location: [facility];  Service: Orthopedics;  Laterality: Right; . ORIF DISTAL RADIUS FRACTURE Right 04/29/2016 HPI: Pt is an 83 y.o. female admitted with L facial droop. CT showed a R lentiform hemorrhage with mild surrounding edema. PMH includes: dementia (living in ILF), sick sinus syndrome, hyperlipidemia, essential hypertension, CAD, chronic anticoagulation, atrial fibrillation, carotid artery disease, PNA  Subjective: pt alert, pleasant, but confused Assessment / Plan / Recommendation CHL IP CLINICAL IMPRESSIONS 12/11/2017 Clinical Impression Pt has a moderate oral and mild pharyngeal dysphagia with sensorimotor deficits. Orally she has  weak labial seal and lingual manipulation, allowing premature spillage with thin  liquids, saliva, and even a large piece of unmasticated cracker without pt awareness. She has mild-moderate L buccal pocketing, but can attend to this residue when given Min-Mod cues for lingual sweep. Her timing is mildly impaired for swallow trigger with thin liquids, likely at least in part related to decreased oral containement and/or decreased sensation, although no aspiration occurs. She consistently penetrates, with penetrates getting close to the true vocal folds, but penetrates clear the laryngeal vestibule upon completion of the swallow. Nectar thick liquids were also tested with no penetration observed. Recommend to start wtih Dys 1 diet and thin liquids with full supervision. Should pt experience any overt difficulty during meals, nectar thick liquids could be considered as a safer alternative. SLP will continue to follow for tolerance and readiness to advance. SLP Visit Diagnosis Dysphagia, oropharyngeal phase (R13.12) Attention and concentration deficit following -- Frontal lobe and executive function deficit following -- Impact on safety and function Mild aspiration risk;Moderate aspiration risk   CHL IP TREATMENT RECOMMENDATION 12/11/2017 Treatment Recommendations Therapy as outlined in treatment plan below   Prognosis 12/11/2017 Prognosis for Safe Diet Advancement Good Barriers to Reach Goals Cognitive deficits Barriers/Prognosis Comment -- CHL IP DIET RECOMMENDATION 12/11/2017 SLP Diet Recommendations Dysphagia 1 (Puree) solids;Thin liquid Liquid Administration via Cup;Straw Medication Administration Crushed with puree Compensations Slow rate;Small sips/bites;Minimize environmental distractions;Lingual sweep for clearance of pocketing;Monitor for anterior loss Postural Changes Seated upright at 90 degrees   CHL IP OTHER RECOMMENDATIONS 12/11/2017 Recommended Consults -- Oral Care Recommendations Oral care BID Other Recommendations Have oral suction available   CHL IP FOLLOW UP RECOMMENDATIONS  12/11/2017 Follow up Recommendations (No Data)   CHL IP FREQUENCY AND DURATION 12/11/2017 Speech Therapy Frequency (ACUTE ONLY) min 2x/week Treatment Duration 2 weeks      CHL IP ORAL PHASE 12/11/2017 Oral Phase Impaired Oral - Pudding Teaspoon -- Oral - Pudding Cup -- Oral - Honey Teaspoon -- Oral - Honey Cup -- Oral - Nectar Teaspoon -- Oral - Nectar Cup -- Oral - Nectar Straw Weak lingual manipulation;Reduced posterior propulsion Oral - Thin Teaspoon -- Oral - Thin Cup Weak lingual manipulation;Reduced posterior propulsion;Left anterior bolus loss Oral - Thin Straw Weak lingual manipulation;Reduced posterior propulsion Oral - Puree Weak lingual manipulation;Reduced posterior propulsion;Left pocketing in lateral sulci Oral - Mech Soft Weak lingual manipulation;Reduced posterior propulsion;Left pocketing in lateral sulci;Left anterior bolus loss Oral - Regular -- Oral - Multi-Consistency -- Oral - Pill -- Oral Phase - Comment --  CHL IP PHARYNGEAL PHASE 12/11/2017 Pharyngeal Phase Impaired Pharyngeal- Pudding Teaspoon -- Pharyngeal -- Pharyngeal- Pudding Cup -- Pharyngeal -- Pharyngeal- Honey Teaspoon -- Pharyngeal -- Pharyngeal- Honey Cup -- Pharyngeal -- Pharyngeal- Nectar Teaspoon -- Pharyngeal -- Pharyngeal- Nectar Cup -- Pharyngeal -- Pharyngeal- Nectar Straw WFL Pharyngeal -- Pharyngeal- Thin Teaspoon -- Pharyngeal -- Pharyngeal- Thin Cup Penetration/Aspiration before swallow Pharyngeal Material enters airway, remains ABOVE vocal cords and not ejected out Pharyngeal- Thin Straw Penetration/Aspiration before swallow Pharyngeal Material enters airway, remains ABOVE vocal cords and not ejected out Pharyngeal- Puree WFL Pharyngeal -- Pharyngeal- Mechanical Soft WFL Pharyngeal -- Pharyngeal- Regular -- Pharyngeal -- Pharyngeal- Multi-consistency -- Pharyngeal -- Pharyngeal- Pill -- Pharyngeal -- Pharyngeal Comment --  CHL IP CERVICAL ESOPHAGEAL PHASE 12/11/2017 Cervical Esophageal Phase WFL Pudding Teaspoon --  Pudding Cup -- Honey Teaspoon -- Honey Cup -- Nectar Teaspoon -- Nectar Cup -- Nectar Straw -- Thin Teaspoon -- Thin Cup -- Thin Straw -- Puree --  Mechanical Soft -- Regular -- Multi-consistency -- Pill -- Cervical Esophageal Comment -- Germain Osgood 12/11/2017, 5:30 PM  Germain Osgood, M.A. CCC-SLP Acute Rehabilitation Services Pager [phone] Office [phone]             Ct Head Code Stroke Wo Contrast  Result Date: 12/10/2017 CLINICAL DATA:  Code stroke. 83 year old female with sudden onset left side weakness. EXAM: CT HEAD WITHOUT CONTRAST TECHNIQUE: Contiguous axial images were obtained from the base of the skull through the vertex without intravenous contrast. COMPARISON:  Head CT without contrast 11/24/2017 and earlier. FINDINGS: Brain: There is an oval or diamond-shaped hyperdense hemorrhage in the right lentiform and Corona radiata encompassing 19 x 17 x 25 millimeters (AP by transverse by CC) for an estimated blood volume of 4 milliliters. Mild surrounding edema. Minor regional mass effect. No extension into the ventricles or outside of the brain parenchyma. Superimposed confluent bilateral cerebral white matter hypodensity and deep gray matter heterogeneity. Cavum septum pellucidum, normal variant. No acute cortically based infarct identified. 12-13 millimeter calcified right tentorial meningioma re-demonstrated, and was 10 millimeters in 2012. Vascular: Calcified atherosclerosis at the skull base. No suspicious intracranial vascular hyperdensity. The Skull: Stable and intact. Sinuses/Orbits: Continued left OMC obstructive pattern sinus disease. Stable sinus and mastoid aeration. Other: Partially resolved left scalp hematoma since October. No new scalp or orbits soft tissue abnormality. ASPECTS [facility] Stroke Program Early CT Score) Total score (0-10 with 10 being normal): Not applicable, acute hemorrhage. IMPRESSION: 1. Small acute right lentiform hemorrhage with estimated blood  volume of 4 mL. Mild surrounding edema. No ventricular or extra-axial extension. 2. Critical Value/emergent results were called by telephone at the time of interpretation on 12/10/2017 at 7:03 am to Dr. Ripley Fraise , who verbally acknowledged these results. 3. Underlying chronic small vessel disease. Partially resolved left scalp hematoma since October. Small chronic right tentorial meningioma. Electronically Signed   By: Genevie Ann M.D.   On: 12/10/2017 07:04    Assessment/Plan  #1 very localized erythema however left thumb with edema-this does not appear cellulitic is not acutely tender- will order an x-ray and await results continue to monitor I do not see similar presentations elsewhere on her digits. She does not report any history of trauma however she is somewhat of a poor histrian  [phone]

## 2017-12-22 ENCOUNTER — Other Ambulatory Visit: Payer: Self-pay | Admitting: *Deleted

## 2017-12-22 ENCOUNTER — Encounter: Payer: Self-pay | Admitting: Internal Medicine

## 2017-12-22 NOTE — Patient Outreach (Signed)
[facility]) Care Management  12/22/2017  Arman Filter 11/04/1934 [id number]   Onsite IDT meeting. Patient is at facility due to a stroke. She also has HF and dementia.  Patient was living in an apartment before the stroke.   Facility reports that patient family has applied for Medicaid and are looking at ALF at discharge if patient will qualify. They are unsure at this time if she will need ALF or LTC.  Plan to sign off as no [facility] care management needs identified at this time.  [facility] CM can be consulted again if discharge plan needs.   Royetta Crochet. Laymond Purser, RN, BSN, [place] [phone]) Business Cell  [phone]) Toll Free Office

## 2017-12-24 ENCOUNTER — Other Ambulatory Visit (HOSPITAL_COMMUNITY)
Admission: RE | Admit: 2017-12-24 | Discharge: 2017-12-24 | Disposition: A | Discharge status: Home / Self Care | Payer: PPO | Source: Skilled Nursing Facility | Attending: *Deleted | Admitting: *Deleted

## 2017-12-24 LAB — BASIC METABOLIC PANEL
ANION GAP: 8 (ref 5–15)
BUN: 21 mg/dL (ref 8–23)
CALCIUM: 9.6 mg/dL (ref 8.9–10.3)
CO2: 26 mmol/L (ref 22–32)
Chloride: 103 mmol/L (ref 98–111)
Creatinine, Ser: 0.99 mg/dL (ref 0.44–1.00)
GFR, EST NON AFRICAN AMERICAN: 53 mL/min — AB (ref >60)
GLUCOSE: 130 mg/dL — AB (ref 70–99)
POTASSIUM: 4.4 mmol/L (ref 3.5–5.1)
Sodium: 137 mmol/L (ref 135–145)

## 2017-12-27 DEATH — deceased

## 2018-01-04 ENCOUNTER — Other Ambulatory Visit (HOSPITAL_COMMUNITY): Payer: Self-pay | Admitting: Specialist

## 2018-01-04 DIAGNOSIS — R1319 Other dysphagia: Secondary | ICD-10-CM

## 2018-01-07 ENCOUNTER — Non-Acute Institutional Stay (SKILLED_NURSING_FACILITY): Payer: PPO | Admitting: Internal Medicine

## 2018-01-07 ENCOUNTER — Encounter: Payer: Self-pay | Admitting: Internal Medicine

## 2018-01-07 DIAGNOSIS — E876 Hypokalemia: Secondary | ICD-10-CM | POA: Diagnosis not present

## 2018-01-07 DIAGNOSIS — I4891 Unspecified atrial fibrillation: Secondary | ICD-10-CM | POA: Diagnosis not present

## 2018-01-07 DIAGNOSIS — F0390 Unspecified dementia without behavioral disturbance: Secondary | ICD-10-CM | POA: Diagnosis not present

## 2018-01-07 DIAGNOSIS — I10 Essential (primary) hypertension: Secondary | ICD-10-CM | POA: Diagnosis not present

## 2018-01-07 DIAGNOSIS — I610 Nontraumatic intracerebral hemorrhage in hemisphere, subcortical: Secondary | ICD-10-CM | POA: Diagnosis not present

## 2018-01-07 DIAGNOSIS — I5032 Chronic diastolic (congestive) heart failure: Secondary | ICD-10-CM

## 2018-01-07 NOTE — Progress Notes (Signed)
Location:    [facility] Room Number: 103/P Place of Service:  SNF [phone]) Provider:  Cory Roughen, MD  Patient Care Team: Celene Squibb, MD as PCP - General (Internal Medicine) Evans Lance, MD as Consulting Physician (Cardiology)  Extended Emergency Contact Information Primary Emergency Contact: Raylene Everts Address: [address] [place] [address] [place] Phone: [phone] Mobile Phone: [phone] Relation: Daughter Secondary Emergency Contact: Benjamine Mola [place] Mobile Phone: [phone] Relation: Granddaughter  Code Status:  DNR Goals of care: Advanced Directive information Advanced Directives 01/07/2018  Does Patient Have a Medical Advance Directive? Yes  Type of Advance Directive Out of facility DNR (pink MOST or yellow form)  Does patient want to make changes to medical advance directive? No - Patient declined  Copy of [place] in Chart? -  Would patient like information on creating a medical advance directive? No - Patient declined  Pre-existing out of facility DNR order (yellow form or pink MOST form) -     Chief Complaint  Patient presents with  . Acute Visit    F/U Visit  To follow-up on medical conditions including history of hemorrhagic CVA-diastolic CHF- type 2 diabetes- atrial fibrillation- dementia  HPI:  Pt is a 83 y.o. female seen today for an acute visit for follow-up of her medical conditions status post recent admission.  Patient was hospitalized in mid November for facial weakness is found to have a right lentiform hemorrhage with mild edema she was given medication to reverse her Coumadin which she was on for A. fib.  She also had hypoxia and CT scan of her chest showed acute CHF subsequent echo showed diastolic dysfunction.  Was diuresed with Lasix and clinically improved her weight so far here appears to be relatively stable around the  mid 160s.  She also had agitation in the hospital and did eventually receive low-dose Seroquel apparently this has helped.  Initially she had some anxiety when she arrived here but this has really moderated she appears to have adapted well to this setting and apparently there is a possibility she may be here for long-term care she had previously lived in an apartment but had a very supportive family nearby.    Currently she is lying in her bed comfortably vital signs appear to be stable she does not really have any complaints nursing is not really reported any recent issues I  Her medical conditions appear to be stable she does continue on Lasix with potassium supplementation and again her weight appears relatively stable.  She is on low-dose Glucotrol for diabetes her blood sugars in the morning appear to be largely in the lower 100s at at bedtime some more variability ranging from the high 90s to around 200 but I do not see consistent elevations I would say the baselines more around the mid higher 100s which considering her relatively advanced age is satisfactory.  She does continue on metoprolol for rate control with history of atrial fibrillation she is not on anticoagulation because of the bleed.  She also continues on diltiazem.     Past Medical History:  Diagnosis Date  . Arthritis   . Atrial fibrillation ([place])   . Carotid artery disease ([place])    Right carotid endarectomy 1999  . Chronic anticoagulation    Followed by PMD  . Coronary atherosclerosis    a. Minor at cardiac  catheterization 2005 b. cath 10/16/2014 40% prox LAD dx, otherwise minimal CAD  . Depression   . Essential hypertension   . Glucose intolerance (impaired glucose tolerance)   . History of kidney stones   . Hyperlipidemia   . Pneumonia 2010  . Sick sinus syndrome [facility])    [employer] PPM   Past Surgical History:  Procedure Laterality Date  . ABDOMINAL HYSTERECTOMY    . APPENDECTOMY    . BREAST BIOPSY  Bilateral    x 7 total  . CARDIAC CATHETERIZATION N/A 10/16/2014   Procedure: Left Heart Cath and Coronary Angiography;  Surgeon: Leonie Man, MD;  Location: [facility] CV LAB;  Service: Cardiovascular;  Laterality: N/A;  . CAROTID ENDARTERECTOMY Right 1999  . CATARACT EXTRACTION W/ INTRAOCULAR LENS  IMPLANT, BILATERAL Bilateral   . COLONOSCOPY     2006  . CYSTOSCOPY W/ URETERAL STENT PLACEMENT Left 04/19/2012   Procedure: CYSTOSCOPY WITH RETROGRADE PYELOGRAM/URETERAL STENT PLACEMENT ;  Surgeon: Ailene Rud, MD;  Location: [facility];  Service: Urology;  Laterality: Left;  . CYSTOSCOPY WITH RETROGRADE PYELOGRAM, URETEROSCOPY AND STENT PLACEMENT Left 06/30/2012   Procedure: CYSTOSCOPY WITH LEFT  RETROGRADE PYELOGRAM, URETEROSCOPY  with basketing of stone, AND STENT PLACEMENT, TRANSURETHRAL UNROOFING OF URETER.;  Surgeon: Alexis Frock, MD;  Location: [facility];  Service: Urology;  Laterality: Left;  . INSERT / REPLACE / REMOVE PACEMAKER  2006  . OPEN REDUCTION INTERNAL FIXATION (ORIF) DISTAL RADIAL FRACTURE Right 04/29/2016   Procedure: OPEN REDUCTION INTERNAL FIXATION (ORIF) DISTAL RADIAL FRACTURE;  Surgeon: Roseanne Kaufman, MD;  Location: [facility];  Service: Orthopedics;  Laterality: Right;  . ORIF DISTAL RADIUS FRACTURE Right 04/29/2016    Allergies  Allergen Reactions  . Morphine Nausea Only  . Penicillins Other (See Comments)    Has patient had a PCN reaction causing immediate rash, facial/tongue/throat swelling, SOB or lightheadedness with hypotension: NO Has patient had a PCN reaction causing severe rash involving mucus membranes or skin necrosis: no Has patient had a PCN reaction that required hospitalization: NO Has patient had a PCN reaction occurring within the last 10 years: NO If all of the above answers are "NO", then may proceed with Cephalosporin use.     Outpatient Encounter Medications as of 01/07/2018  Medication Sig  . acetaminophen (TYLENOL) 325 MG tablet Take 2  tablets (650 mg total) by mouth every 6 (six) hours as needed for mild pain (or temp > 37.5 C (99.5 F)).  Marland Kitchen diltiazem (TIAZAC) 240 MG 24 hr capsule Take 240 mg by mouth daily.  Marland Kitchen docusate sodium (COLACE) 100 MG capsule Take 100 mg by mouth daily.  . furosemide (LASIX) 40 MG tablet Take 1 tablet (40 mg total) by mouth daily.  Marland Kitchen glipiZIDE (GLUCOTROL) 5 MG tablet Take 2.5 mg by mouth daily before breakfast.  . losartan (COZAAR) 50 MG tablet Take 1 tablet (50 mg total) by mouth 2 (two) times daily. In the morning  . Melatonin 3 MG TABS Take 1 tablet (3 mg total) by mouth at bedtime as needed (SLEEP).  . metoprolol tartrate (LOPRESSOR) 50 MG tablet Take 50 mg by mouth 2 (two) times daily.  Marland Kitchen omeprazole (PRILOSEC) 20 MG capsule Take 20 mg by mouth daily.  . potassium chloride (K-DUR,KLOR-CON) 10 MEQ tablet Take 20 mEq by mouth daily.  . pravastatin (PRAVACHOL) 20 MG tablet Take 40 mg by mouth daily.   . QUEtiapine (SEROQUEL) 25 MG tablet Take 0.5 tablets (12.5 mg total) by mouth at bedtime.  Marland Kitchen  venlafaxine (EFFEXOR) 100 MG tablet Take 100 mg by mouth daily.   . [DISCONTINUED] NYSTATIN PO Swab mouth three times a day with nystatin times 5 days for thrush   No facility-administered encounter medications on file as of 01/07/2018.     Review of Systems   This is somewhat limited secondary to dementia.  In general not complaining of fever chills weight appears to be relatively stable.  Skin does not complain of rashes or itching.  Head ears eyes nose mouth and throat is not complaining of visual impairment or sore throat or difficulty swallowing.  Respiratory does not complain of shortness of breath or cough.  Cardiac is not complaining of chest pain does not really appear to have much lower extremity edema.  GI is not complaining of abdominal pain nausea vomiting diarrhea constipation says she has a good appetite likes the food.  GU does not complain of dysuria.  Musculoskeletal is not  complaining of joint pain.  Neurologic does not complain of syncope weakness or dizziness.  And psych appears to be adapting well she is on low-dose Seroquel nursing is not really reported any behaviors  Immunization History  Administered Date(s) Administered  . Influenza,inj,Quad PF,6+ Mos 10/14/2014  . Pneumococcal Conjugate-13 04/06/2013   Pertinent  Health Maintenance Due  Topic Date Due  . INFLUENZA VACCINE  01/15/2018 (Originally 08/27/2017)  . FOOT EXAM  01/15/2018 (Originally 09/10/1944)  . OPHTHALMOLOGY EXAM  01/15/2018 (Originally 09/10/1944)  . PNA vac Low Risk Adult (2 of 2 - PPSV23) 01/15/2018 (Originally 04/07/2014)  . URINE MICROALBUMIN  01/16/2018 (Originally 09/10/1944)  . HEMOGLOBIN A1C  06/12/2018  . DEXA SCAN  Completed   Fall Risk  04/06/2013  Falls in the past year? No   Functional Status Survey:    Vitals:   01/07/18 1523  BP: 126/79  Pulse: 82  Resp: 18  Temp: 97.6 F (36.4 C)  TempSrc: Oral  Weight is 167.6 pounds Physical Exam  In general this is a very pleasant alert well-developed elderly female in no distress lying comfortably in bed.  Her skin is warm and dry.  Eyes visual acuity appears to be intact sclera and conjunctive are clear pupils appear to be reactive to light.  Oropharynx is clear mucous membranes moist tongue is midline.  Chest is clear to auscultation there is no labored breathing.  Heart is regular rate and rhythm without murmur gallop or rub she does not really have significant lower extremity edema.  Abdomen is soft somewhat obese nontender with positive bowel sounds.  Musculoskeletal Limited exam since she is in bed but is able to move all extremities x4 has possibly some quite mild left upper extremity weakness compared to right but this is fairly minimal.  Neurologic as noted above possibly some mild left upper extremity weakness and slight facial weakness but this is fairly minimal her speech appears to be largely  clear.  Psych she is pleasant and appropriate oriented to self follow simple verbal commands without difficulty  Labs reviewed: Recent Labs    12/10/17 2336  12/15/17 0520 12/02/2017 0426 12/17/17 0700 12/24/17 0539  NA 139   < > 140 140 140 137  K 3.6   < > 3.0* 3.4* 3.6 4.4  CL 105   < > 105 102 104 103  CO2 27   < > 27 27 27 26   GLUCOSE 170*   < > 164* 173* 161* 130*  BUN 16   < > 14 20 14 21   CREATININE  0.94   < > 0.98 1.02* 0.73 0.99  CALCIUM 8.7*   < > 9.1 9.5 9.1 9.6  MG 1.8  --  1.8  --   --   --    < > = values in this interval not displayed.   Recent Labs    11/24/17 1712 12/10/17 0701  AST 20 21  ALT 16 17  ALKPHOS 100 86  BILITOT 0.6 0.7  PROT 7.4 7.5  ALBUMIN 3.6 3.6   Recent Labs    12/10/17 0701  12/14/17 0454 12/15/17 0520 12/18/2017 0426 12/17/17 0700  WBC 10.5   < > 10.9* 11.1* 13.1* 11.2*  NEUTROABS 6.4  --  7.4  --   --  7.1  HGB 14.9   < > 11.7* 12.2 12.5 12.3  HCT 45.1   < > 36.5 37.1 38.6 38.4  MCV 95.6   < > 99.5 96.9 97.2 98.2  PLT 280   < > 179 234 234 252   < > = values in this interval not displayed.   Lab Results  Component Value Date   TSH 2.115 12/16/2013   Lab Results  Component Value Date   HGBA1C 7.9 (H) 12/12/2017   Lab Results  Component Value Date   CHOL 170 12/12/2017   HDL 33 (L) 12/12/2017   LDLCALC 107 (H) 12/12/2017   TRIG 148 12/12/2017   CHOLHDL 5.2 12/12/2017    Significant Diagnostic Results in last 30 days:  Ct Angio Head W Or Wo Contrast  Result Date: 12/11/2017 CLINICAL DATA:  Follow-up examination for known intracranial hemorrhage. Patient with right lentiform intraparenchymal hematoma. EXAM: CT ANGIOGRAPHY HEAD AND NECK TECHNIQUE: Multidetector CT imaging of the head and neck was performed using the standard protocol during bolus administration of intravenous contrast. Multiplanar CT image reconstructions and MIPs were obtained to evaluate the vascular anatomy. Carotid stenosis measurements (when  applicable) are obtained utilizing NASCET criteria, using the distal internal carotid diameter as the denominator. CONTRAST:  32mL ISOVUE-370 IOPAMIDOL (ISOVUE-370) INJECTION 76% COMPARISON:  Prior CT from 12/10/2017 as well as earlier studies. FINDINGS: CTA NECK FINDINGS Aortic arch: Visualized aortic arch of normal caliber with normal branch pattern. Moderate atherosclerotic change about the aortic arch and origin of the great vessels without hemodynamically significant stenosis. Visualized subclavian arteries widely patent. Right carotid system: Scattered non stenotic plaque within the right common carotid artery without significant narrowing. Concentric calcified plaque about the right bifurcation/proximal right ICA without hemodynamically significant stenosis. Probable remote changes related to prior endarterectomy noted. Right ICA patent from the bifurcation to the skull base without stenosis, dissection, or occlusion. Left carotid system: Left common carotid artery patent from its origin to the bifurcation without significant stenosis. Left common carotid artery medialized into the retropharyngeal space. Scattered a centric calcified plaque about the left bifurcation/proximal left ICA with associated stenosis of up to approximately 50% by NASCET criteria. Left ICA widely patent distally to the skull base without stenosis, dissection, or occlusion. Vertebral arteries: Both of the vertebral arteries arise from the subclavian arteries. The left vertebral artery appears to be slightly dominant. Left vertebral artery essentially occludes just beyond its origin, and remains occluded to approximately the level of C4-5. Distal reconstitution likely via muscular branches. Irregular attenuated flow seen distally within the left vertebral artery which is otherwise patent to the skull base. The right vertebral artery is occluded at its origin. Irregular distal reconstitution at approximately the level of C5-6 (series 8,  image 231). Irregular thready and attenuated  flow seen distally throughout the right ICA with multifocal moderate to severe segmental stenoses. Right vertebral artery is nearly occluded as it reaches the cranial vault (series 8, image 165). Skeleton: No acute osseus abnormality. No discrete lytic or blastic osseous lesions. Moderate cervical spondylolysis noted at C4-5 through C6-7. Other neck: No acute soft tissue abnormality within the neck. Chronic left maxillary sinusitis noted. Salivary glands within normal limits. No adenopathy. Thyroid within normal limits. Upper chest: Streak artifact from left-sided pacemaker/AICD. Left-sided pleural effusion with associated atelectasis partially visualized. Additional scattered atelectatic changes noted within the visualized lungs. Review of the MIP images confirms the above findings CTA HEAD FINDINGS Anterior circulation: Petrous segments widely patent bilaterally. Advanced atheromatous plaque within the cavernous/supraclinoid ICAs with moderate multifocal narrowing. ICA termini widely patent. Left A1 segment irregular but patent without high-grade stenosis. Severe diffuse stenosis of the right A1 segment. Normal anterior communicating artery. Extensive atheromatous irregularity throughout the ACAs, left greater than right were there are multifocal moderate to severe stenoses. Left M1 irregular but widely patent without high-grade stenosis. Focal moderate proximal right M1 stenosis noted (series 8, image 92). Normal MCA bifurcations. No proximal M2 occlusion. Extensive small vessel atheromatous irregularity throughout the MCA branches bilaterally which are well perfused and fairly symmetric. Posterior circulation: Multifocal atheromatous irregularity within the dominant left V4 segment which is patent to the vertebrobasilar junction without high-grade stenosis. Patent left PICA. Diminutive right vertebral artery nearly occluded at the skull base, with scant irregular flow  seen within the right V4 segment. Right V4 occludes prior to the vertebrobasilar junction. Right PICA of not seen. Basilar artery somewhat diminutive. Severe atheromatous change throughout the mid and distal basilar artery with multifocal severe stenoses. Anterior inferior cerebral arteries patent bilaterally. Superior cerebral arteries grossly patent. Predominant fetal type origin of the PCAs with patent and robust posterior communicating arteries. PCAs are patent to their distal aspects with multifocal moderate to severe distal P3 and P4 stenoses. Venous sinuses: Not well assessed due to arterial timing of the contrast bolus. Anatomic variants: Fetal type origin of the PCAs with underlying diminutive vertebrobasilar system. Focal outpouching at the cavernous left ICA in the region of the left hypophyseal artery favored to reflect a normal vascular infundibulum. No AVM or other vascular abnormality seen underlying the right lentiform hemorrhage. Delayed phase: No abnormal enhancement. Right lentiform nucleus hemorrhage relatively stable measuring 13 x 14 mm. No intraventricular extension. Review of the MIP images confirms the above findings IMPRESSION: 1. No significant interval change in size and appearance right lenticular hematoma. No underlying vascular abnormality identified. 2. Severe vertebrobasilar atherosclerotic disease as above. Proximal-mid vertebral arteries are occluded within the neck with distal reconstitution. Right vertebral subsequently occludes at the skull base. Multifocal severe stenoses seen throughout the basilar artery. Overall, vertebrobasilar system is diminutive with fetal type origin of the PCAs. 3. Approximate 50% atheromatous stenosis at the left carotid bifurcation. 4. Sequelae of remote right carotid endarterectomy, with no significant residual or recurrent stenosis. 5. Additional extensive atherosclerotic change throughout the intracranial circulation as above. No large vessel  occlusion. Electronically Signed   By: Jeannine Boga M.D.   On: 12/11/2017 17:12   Ct Head Wo Contrast  Result Date: 12/10/2017 CLINICAL DATA:  Intracerebral hemorrhage follow-up EXAM: CT HEAD WITHOUT CONTRAST TECHNIQUE: Contiguous axial images were obtained from the base of the skull through the vertex without intravenous contrast. COMPARISON:  CT head 12/10/2017 FINDINGS: Brain: Right posterior basal ganglia and deep white matter hematoma is unchanged in size  measuring 13 x 13 by 26 mm. No intraventricular extension. No mass-effect or midline shift. Moderate atrophy. Ventricular enlargement due to atrophy is unchanged. Extensive chronic microvascular ischemic changes in the white matter. Densely calcified mass along the right tentorium projecting inferiorly is unchanged measuring 12 mm compatible with meningioma. Vascular: Extensive atherosclerotic calcification. Negative for hyperdense vessel Skull: Negative Sinuses/Orbits: Extensive mucosal edema left maxillary ethmoid and frontal sinus unchanged. Calcified secretions left ethmoid sinus. Other: None IMPRESSION: Right lenticular hematoma unchanged from earlier today. No intraventricular hemorrhage or midline shift. Electronically Signed   By: Franchot Gallo M.D.   On: 12/10/2017 19:19   Ct Angio Neck W Or Wo Contrast  Result Date: 12/11/2017 CLINICAL DATA:  Follow-up examination for known intracranial hemorrhage. Patient with right lentiform intraparenchymal hematoma. EXAM: CT ANGIOGRAPHY HEAD AND NECK TECHNIQUE: Multidetector CT imaging of the head and neck was performed using the standard protocol during bolus administration of intravenous contrast. Multiplanar CT image reconstructions and MIPs were obtained to evaluate the vascular anatomy. Carotid stenosis measurements (when applicable) are obtained utilizing NASCET criteria, using the distal internal carotid diameter as the denominator. CONTRAST:  46mL ISOVUE-370 IOPAMIDOL (ISOVUE-370)  INJECTION 76% COMPARISON:  Prior CT from 12/10/2017 as well as earlier studies. FINDINGS: CTA NECK FINDINGS Aortic arch: Visualized aortic arch of normal caliber with normal branch pattern. Moderate atherosclerotic change about the aortic arch and origin of the great vessels without hemodynamically significant stenosis. Visualized subclavian arteries widely patent. Right carotid system: Scattered non stenotic plaque within the right common carotid artery without significant narrowing. Concentric calcified plaque about the right bifurcation/proximal right ICA without hemodynamically significant stenosis. Probable remote changes related to prior endarterectomy noted. Right ICA patent from the bifurcation to the skull base without stenosis, dissection, or occlusion. Left carotid system: Left common carotid artery patent from its origin to the bifurcation without significant stenosis. Left common carotid artery medialized into the retropharyngeal space. Scattered a centric calcified plaque about the left bifurcation/proximal left ICA with associated stenosis of up to approximately 50% by NASCET criteria. Left ICA widely patent distally to the skull base without stenosis, dissection, or occlusion. Vertebral arteries: Both of the vertebral arteries arise from the subclavian arteries. The left vertebral artery appears to be slightly dominant. Left vertebral artery essentially occludes just beyond its origin, and remains occluded to approximately the level of C4-5. Distal reconstitution likely via muscular branches. Irregular attenuated flow seen distally within the left vertebral artery which is otherwise patent to the skull base. The right vertebral artery is occluded at its origin. Irregular distal reconstitution at approximately the level of C5-6 (series 8, image 231). Irregular thready and attenuated flow seen distally throughout the right ICA with multifocal moderate to severe segmental stenoses. Right vertebral  artery is nearly occluded as it reaches the cranial vault (series 8, image 165). Skeleton: No acute osseus abnormality. No discrete lytic or blastic osseous lesions. Moderate cervical spondylolysis noted at C4-5 through C6-7. Other neck: No acute soft tissue abnormality within the neck. Chronic left maxillary sinusitis noted. Salivary glands within normal limits. No adenopathy. Thyroid within normal limits. Upper chest: Streak artifact from left-sided pacemaker/AICD. Left-sided pleural effusion with associated atelectasis partially visualized. Additional scattered atelectatic changes noted within the visualized lungs. Review of the MIP images confirms the above findings CTA HEAD FINDINGS Anterior circulation: Petrous segments widely patent bilaterally. Advanced atheromatous plaque within the cavernous/supraclinoid ICAs with moderate multifocal narrowing. ICA termini widely patent. Left A1 segment irregular but patent without high-grade stenosis. Severe diffuse  stenosis of the right A1 segment. Normal anterior communicating artery. Extensive atheromatous irregularity throughout the ACAs, left greater than right were there are multifocal moderate to severe stenoses. Left M1 irregular but widely patent without high-grade stenosis. Focal moderate proximal right M1 stenosis noted (series 8, image 92). Normal MCA bifurcations. No proximal M2 occlusion. Extensive small vessel atheromatous irregularity throughout the MCA branches bilaterally which are well perfused and fairly symmetric. Posterior circulation: Multifocal atheromatous irregularity within the dominant left V4 segment which is patent to the vertebrobasilar junction without high-grade stenosis. Patent left PICA. Diminutive right vertebral artery nearly occluded at the skull base, with scant irregular flow seen within the right V4 segment. Right V4 occludes prior to the vertebrobasilar junction. Right PICA of not seen. Basilar artery somewhat diminutive. Severe  atheromatous change throughout the mid and distal basilar artery with multifocal severe stenoses. Anterior inferior cerebral arteries patent bilaterally. Superior cerebral arteries grossly patent. Predominant fetal type origin of the PCAs with patent and robust posterior communicating arteries. PCAs are patent to their distal aspects with multifocal moderate to severe distal P3 and P4 stenoses. Venous sinuses: Not well assessed due to arterial timing of the contrast bolus. Anatomic variants: Fetal type origin of the PCAs with underlying diminutive vertebrobasilar system. Focal outpouching at the cavernous left ICA in the region of the left hypophyseal artery favored to reflect a normal vascular infundibulum. No AVM or other vascular abnormality seen underlying the right lentiform hemorrhage. Delayed phase: No abnormal enhancement. Right lentiform nucleus hemorrhage relatively stable measuring 13 x 14 mm. No intraventricular extension. Review of the MIP images confirms the above findings IMPRESSION: 1. No significant interval change in size and appearance right lenticular hematoma. No underlying vascular abnormality identified. 2. Severe vertebrobasilar atherosclerotic disease as above. Proximal-mid vertebral arteries are occluded within the neck with distal reconstitution. Right vertebral subsequently occludes at the skull base. Multifocal severe stenoses seen throughout the basilar artery. Overall, vertebrobasilar system is diminutive with fetal type origin of the PCAs. 3. Approximate 50% atheromatous stenosis at the left carotid bifurcation. 4. Sequelae of remote right carotid endarterectomy, with no significant residual or recurrent stenosis. 5. Additional extensive atherosclerotic change throughout the intracranial circulation as above. No large vessel occlusion. Electronically Signed   By: Jeannine Boga M.D.   On: 12/11/2017 17:12   Ct Chest Wo Contrast  Result Date: 12/13/2017 CLINICAL DATA:  [place]  on auscultation. EXAM: CT CHEST WITHOUT CONTRAST TECHNIQUE: Multidetector CT imaging of the chest was performed following the standard protocol without IV contrast. COMPARISON:  08/19/2011. Portable chest obtained earlier today. FINDINGS: Cardiovascular: Enlarged heart due to left atrial and left ventricular enlargement. Atheromatous calcifications, including the coronary arteries and aorta. Small pericardial effusion with a maximum thickness of 8 mm. Left subclavian pacemaker leads in satisfactory position. Mediastinum/Nodes: No enlarged mediastinal or axillary lymph nodes. Thyroid gland, trachea, and esophagus demonstrate no significant findings. Lungs/Pleura: Small bilateral pleural effusions. Mild bilateral dependent atelectasis. Prominent pulmonary vasculature and interstitial markings. Upper Abdomen: Dense retained barium in the colon with associated streak artifacts. Musculoskeletal: Old, healed right rib fractures. Thoracic and lower cervical spine degenerative changes. IMPRESSION: 1. Changes of acute congestive heart failure with small bilateral pleural effusions and bilateral dependent atelectasis. 2.  Calcific coronary artery and aortic atherosclerosis. Aortic Atherosclerosis (ICD10-I70.0). Electronically Signed   By: Claudie Revering M.D.   On: 12/13/2017 17:02   Dg Chest Port 1 View  Result Date: 12/14/2017 CLINICAL DATA:  Pleural effusions and atelectasis EXAM: PORTABLE CHEST 1 VIEW  COMPARISON:  12/13/2017 FINDINGS: Stable cardiomegaly. Improvement in the perihilar and basilar edema pattern with residual pleural effusions and compressive basilar atelectasis, worse on the left. Upper lobes remain clear. No pneumothorax. Trachea is midline. Aorta is atherosclerotic. Left subclavian pacer noted. IMPRESSION: Improving CHF pattern with persistent cardiomegaly. Residual bilateral pleural effusions and basilar atelectasis/consolidation worse on the left. Electronically Signed   By: Jerilynn Mages.  Shick M.D.   On:  12/14/2017 08:15   Dg Chest Port 1 View  Result Date: 12/13/2017 CLINICAL DATA:  Shortness of breath EXAM: PORTABLE CHEST 1 VIEW COMPARISON:  12/11/2017 FINDINGS: Left chest wall pacemaker is unchanged. There is moderate cardiomegaly with calcific aortic atherosclerosis. Consolidation at the left lung base with small pleural effusion has slightly worsened from the prior study. There is worsened aeration of the right lower lobe. IMPRESSION: 1. Worsening left basilar consolidation and small left pleural effusion. 2. Worsening aeration of the right lower lobe. Electronically Signed   By: Ulyses Jarred M.D.   On: 12/13/2017 03:26   Dg Chest Port 1 View  Result Date: 12/11/2017 CLINICAL DATA:  Shortness of Breath EXAM: PORTABLE CHEST 1 VIEW COMPARISON:  December 10, 2017 FINDINGS: There is airspace opacity in the left lower lobe with left pleural effusion. The right lung is clear. There is cardiomegaly with pulmonary vascularity normal. There is aortic atherosclerosis. Pacemaker leads are attached to the right atrium and right ventricle. Bones appear osteoporotic. There are surgical clips in the lower right neck region. IMPRESSION: Consolidation left lower lobe with small left pleural effusion. Right lung clear. There is stable cardiomegaly with aortic atherosclerosis. Pacemaker leads attached to right atrium and right ventricle. Bones osteoporotic. Aortic Atherosclerosis (ICD10-I70.0). Electronically Signed   By: Lowella Grip III M.D.   On: 12/11/2017 07:58   Dg Chest Portable 1 View  Result Date: 12/10/2017 CLINICAL DATA:  Shortness of breath.  Code stroke. EXAM: PORTABLE CHEST 1 VIEW COMPARISON:  11/24/2017. FINDINGS: Cardiac pacer noted with lead tips in right atrium right ventricle. Cardiomegaly with diffuse bilateral from interstitial prominence and left-sided pleural effusion. Findings consistent CHF. No pneumothorax. No acute bony abnormality IMPRESSION: Cardiac pacer with lead tips over the  right atrium right ventricle. Cardiomegaly with diffuse bilateral from interstitial prominence and small left pleural effusion consistent with CHF. Electronically Signed   By: Marcello Moores  Register   On: 12/10/2017 08:26   Dg Swallowing Func-speech Pathology  Result Date: 12/11/2017 Objective Swallowing Evaluation: Type of Study: MBS-Modified Barium Swallow Study  Patient Details Name: IYANNAH BLAKE MRN: [id number] Date of Birth: 08-03-1934 Today's Date: 12/11/2017 Time: SLP Start Time (ACUTE ONLY): 8299 -SLP Stop Time (ACUTE ONLY): 3716 SLP Time Calculation (min) (ACUTE ONLY): 25 min Past Medical History: Past Medical History: Diagnosis Date . Arthritis  . Atrial fibrillation ([place])  . Carotid artery disease ([place])   Right carotid endarectomy 1999 . Chronic anticoagulation   Followed by PMD . Coronary atherosclerosis   a. Minor at cardiac catheterization 2005 b. cath 10/16/2014 40% prox LAD dx, otherwise minimal CAD . Depression  . Essential hypertension  . Glucose intolerance (impaired glucose tolerance)  . History of kidney stones  . Hyperlipidemia  . Pneumonia 2010 . Sick sinus syndrome ([facility])   [employer] PPM Past Surgical History: Past Surgical History: Procedure Laterality Date . ABDOMINAL HYSTERECTOMY   . APPENDECTOMY   . BREAST BIOPSY Bilateral   x 7 total . CARDIAC CATHETERIZATION N/A 10/16/2014  Procedure: Left Heart Cath and Coronary Angiography;  Surgeon: Leonie Man, MD;  Location: [facility];  Service: Cardiovascular;  Laterality: N/A; . CAROTID ENDARTERECTOMY Right 1999 . CATARACT EXTRACTION W/ INTRAOCULAR LENS  IMPLANT, BILATERAL Bilateral  . COLONOSCOPY    2006 . CYSTOSCOPY W/ URETERAL STENT PLACEMENT Left 04/19/2012  Procedure: CYSTOSCOPY WITH RETROGRADE PYELOGRAM/URETERAL STENT PLACEMENT ;  Surgeon: Ailene Rud, MD;  Location: [facility];  Service: Urology;  Laterality: Left; . CYSTOSCOPY WITH RETROGRADE PYELOGRAM, URETEROSCOPY AND STENT PLACEMENT Left 06/30/2012  Procedure: CYSTOSCOPY WITH  LEFT  RETROGRADE PYELOGRAM, URETEROSCOPY  with basketing of stone, AND STENT PLACEMENT, TRANSURETHRAL UNROOFING OF URETER.;  Surgeon: Alexis Frock, MD;  Location: [facility];  Service: Urology;  Laterality: Left; . INSERT / REPLACE / REMOVE PACEMAKER  2006 . OPEN REDUCTION INTERNAL FIXATION (ORIF) DISTAL RADIAL FRACTURE Right 04/29/2016  Procedure: OPEN REDUCTION INTERNAL FIXATION (ORIF) DISTAL RADIAL FRACTURE;  Surgeon: Roseanne Kaufman, MD;  Location: [facility];  Service: Orthopedics;  Laterality: Right; . ORIF DISTAL RADIUS FRACTURE Right 04/29/2016 HPI: Pt is an 83 y.o. female admitted with L facial droop. CT showed a R lentiform hemorrhage with mild surrounding edema. PMH includes: dementia (living in ILF), sick sinus syndrome, hyperlipidemia, essential hypertension, CAD, chronic anticoagulation, atrial fibrillation, carotid artery disease, PNA  Subjective: pt alert, pleasant, but confused Assessment / Plan / Recommendation CHL IP CLINICAL IMPRESSIONS 12/11/2017 Clinical Impression Pt has a moderate oral and mild pharyngeal dysphagia with sensorimotor deficits. Orally she has weak labial seal and lingual manipulation, allowing premature spillage with thin liquids, saliva, and even a large piece of unmasticated cracker without pt awareness. She has mild-moderate L buccal pocketing, but can attend to this residue when given Min-Mod cues for lingual sweep. Her timing is mildly impaired for swallow trigger with thin liquids, likely at least in part related to decreased oral containement and/or decreased sensation, although no aspiration occurs. She consistently penetrates, with penetrates getting close to the true vocal folds, but penetrates clear the laryngeal vestibule upon completion of the swallow. Nectar thick liquids were also tested with no penetration observed. Recommend to start wtih Dys 1 diet and thin liquids with full supervision. Should pt experience any overt difficulty during meals, nectar thick liquids could  be considered as a safer alternative. SLP will continue to follow for tolerance and readiness to advance. SLP Visit Diagnosis Dysphagia, oropharyngeal phase (R13.12) Attention and concentration deficit following -- Frontal lobe and executive function deficit following -- Impact on safety and function Mild aspiration risk;Moderate aspiration risk   CHL IP TREATMENT RECOMMENDATION 12/11/2017 Treatment Recommendations Therapy as outlined in treatment plan below   Prognosis 12/11/2017 Prognosis for Safe Diet Advancement Good Barriers to Reach Goals Cognitive deficits Barriers/Prognosis Comment -- CHL IP DIET RECOMMENDATION 12/11/2017 SLP Diet Recommendations Dysphagia 1 (Puree) solids;Thin liquid Liquid Administration via Cup;Straw Medication Administration Crushed with puree Compensations Slow rate;Small sips/bites;Minimize environmental distractions;Lingual sweep for clearance of pocketing;Monitor for anterior loss Postural Changes Seated upright at 90 degrees   CHL IP OTHER RECOMMENDATIONS 12/11/2017 Recommended Consults -- Oral Care Recommendations Oral care BID Other Recommendations Have oral suction available   CHL IP FOLLOW UP RECOMMENDATIONS 12/11/2017 Follow up Recommendations (No Data)   CHL IP FREQUENCY AND DURATION 12/11/2017 Speech Therapy Frequency (ACUTE ONLY) min 2x/week Treatment Duration 2 weeks      CHL IP ORAL PHASE 12/11/2017 Oral Phase Impaired Oral - Pudding Teaspoon -- Oral - Pudding Cup -- Oral - Honey Teaspoon -- Oral - Honey Cup -- Oral - Nectar Teaspoon -- Oral - Nectar Cup -- Oral - Nectar Straw  Weak lingual manipulation;Reduced posterior propulsion Oral - Thin Teaspoon -- Oral - Thin Cup Weak lingual manipulation;Reduced posterior propulsion;Left anterior bolus loss Oral - Thin Straw Weak lingual manipulation;Reduced posterior propulsion Oral - Puree Weak lingual manipulation;Reduced posterior propulsion;Left pocketing in lateral sulci Oral - Mech Soft Weak lingual manipulation;Reduced  posterior propulsion;Left pocketing in lateral sulci;Left anterior bolus loss Oral - Regular -- Oral - Multi-Consistency -- Oral - Pill -- Oral Phase - Comment --  CHL IP PHARYNGEAL PHASE 12/11/2017 Pharyngeal Phase Impaired Pharyngeal- Pudding Teaspoon -- Pharyngeal -- Pharyngeal- Pudding Cup -- Pharyngeal -- Pharyngeal- Honey Teaspoon -- Pharyngeal -- Pharyngeal- Honey Cup -- Pharyngeal -- Pharyngeal- Nectar Teaspoon -- Pharyngeal -- Pharyngeal- Nectar Cup -- Pharyngeal -- Pharyngeal- Nectar Straw WFL Pharyngeal -- Pharyngeal- Thin Teaspoon -- Pharyngeal -- Pharyngeal- Thin Cup Penetration/Aspiration before swallow Pharyngeal Material enters airway, remains ABOVE vocal cords and not ejected out Pharyngeal- Thin Straw Penetration/Aspiration before swallow Pharyngeal Material enters airway, remains ABOVE vocal cords and not ejected out Pharyngeal- Puree WFL Pharyngeal -- Pharyngeal- Mechanical Soft WFL Pharyngeal -- Pharyngeal- Regular -- Pharyngeal -- Pharyngeal- Multi-consistency -- Pharyngeal -- Pharyngeal- Pill -- Pharyngeal -- Pharyngeal Comment --  CHL IP CERVICAL ESOPHAGEAL PHASE 12/11/2017 Cervical Esophageal Phase WFL Pudding Teaspoon -- Pudding Cup -- Honey Teaspoon -- Honey Cup -- Nectar Teaspoon -- Nectar Cup -- Nectar Straw -- Thin Teaspoon -- Thin Cup -- Thin Straw -- Puree -- Mechanical Soft -- Regular -- Multi-consistency -- Pill -- Cervical Esophageal Comment -- Germain Osgood 12/11/2017, 5:30 PM  Germain Osgood, M.A. CCC-SLP Acute Rehabilitation Services Pager [phone] Office [phone]             Ct Head Code Stroke Wo Contrast  Result Date: 12/10/2017 CLINICAL DATA:  Code stroke. 83 year old female with sudden onset left side weakness. EXAM: CT HEAD WITHOUT CONTRAST TECHNIQUE: Contiguous axial images were obtained from the base of the skull through the vertex without intravenous contrast. COMPARISON:  Head CT without contrast 11/24/2017 and earlier. FINDINGS: Brain: There is an  oval or diamond-shaped hyperdense hemorrhage in the right lentiform and Corona radiata encompassing 19 x 17 x 25 millimeters (AP by transverse by CC) for an estimated blood volume of 4 milliliters. Mild surrounding edema. Minor regional mass effect. No extension into the ventricles or outside of the brain parenchyma. Superimposed confluent bilateral cerebral white matter hypodensity and deep gray matter heterogeneity. Cavum septum pellucidum, normal variant. No acute cortically based infarct identified. 12-13 millimeter calcified right tentorial meningioma re-demonstrated, and was 10 millimeters in 2012. Vascular: Calcified atherosclerosis at the skull base. No suspicious intracranial vascular hyperdensity. The Skull: Stable and intact. Sinuses/Orbits: Continued left OMC obstructive pattern sinus disease. Stable sinus and mastoid aeration. Other: Partially resolved left scalp hematoma since October. No new scalp or orbits soft tissue abnormality. ASPECTS [facility] Stroke Program Early CT Score) Total score (0-10 with 10 being normal): Not applicable, acute hemorrhage. IMPRESSION: 1. Small acute right lentiform hemorrhage with estimated blood volume of 4 mL. Mild surrounding edema. No ventricular or extra-axial extension. 2. Critical Value/emergent results were called by telephone at the time of interpretation on 12/10/2017 at 7:03 am to Dr. Ripley Fraise , who verbally acknowledged these results. 3. Underlying chronic small vessel disease. Partially resolved left scalp hematoma since October. Small chronic right tentorial meningioma. Electronically Signed   By: Genevie Ann M.D.   On: 12/10/2017 07:04    Assessment/Plan #1 history of hemorrhagic CVA she appears to made a good recovery from this has minimal weakness residual-she is  not on any anticoagulation at this point continue supportive care  She does have follow-up with neurology.  2.  History of type 2 diabetes this appears relatively well controlled on  Glucotrol blood sugars in the morning as noted above largely in the lower 100s more variability at at bedtime but I do not see consistent elevations in the 200s or depressions below 100 at this point will monitor.  3.-History of diastolic CHF she does not really have significant edema and lung sounds were clear weight appears to be relatively stable she is on Lasix with potassium supplementation I do note she at one point had some mild hypokalemia will update a metabolic panel to ensure stability.  4.-  Fibrillation this appears rate controlled on metoprolol not on anticoagulation currently because of the intracerebral hemorrhage will await neurology input.  She is also on diltiazem.  5.  Hypertension this appears relatively stable systolic is in the 295M today as she has some variability ranging from systolics in the 841L to occasionally 150s but I do not see consistent elevations at this point will monitor since there is quite a bit of variability continues on losartan as well as diltiazem and Metroprolol.  6.-  history of dementia with agitation she is on low-dose Seroquel as well as Effexor this appears stable.--I think initially when she was here there was some anxiety and restlessness but this appears to have really stabilized.  7.  Leukocytosis appears she had minimal leukocytosis recently but differential was within normal range at this point will update no sign of infection.  8.  Hyperlipidemia she is on Pravachol which was increased to 40 mg a day LDL is 107 on most recent lab  Again she appears to be doing quite well she will get updated lab work including a CBC and metabolic panel to keep an eye on her potassium hemoglobin white count and renal function since she is on Lasix with potassium.  [id number]        [phone]

## 2018-01-08 ENCOUNTER — Ambulatory Visit (HOSPITAL_COMMUNITY): Payer: PPO | Attending: Internal Medicine | Admitting: Speech Pathology

## 2018-01-08 ENCOUNTER — Ambulatory Visit (HOSPITAL_COMMUNITY)
Admission: RE | Admit: 2018-01-08 | Discharge: 2018-01-08 | Disposition: A | Discharge status: Home / Self Care | Payer: PPO | Source: Ambulatory Visit | Attending: Internal Medicine | Admitting: Internal Medicine

## 2018-01-08 ENCOUNTER — Encounter (HOSPITAL_COMMUNITY)
Admission: RE | Admit: 2018-01-08 | Discharge: 2018-01-08 | Disposition: A | Discharge status: Home / Self Care | Payer: PPO | Source: Skilled Nursing Facility | Attending: Internal Medicine | Admitting: Internal Medicine

## 2018-01-08 DIAGNOSIS — I739 Peripheral vascular disease, unspecified: Secondary | ICD-10-CM | POA: Insufficient documentation

## 2018-01-08 DIAGNOSIS — R1319 Other dysphagia: Secondary | ICD-10-CM | POA: Insufficient documentation

## 2018-01-08 DIAGNOSIS — R1310 Dysphagia, unspecified: Secondary | ICD-10-CM | POA: Insufficient documentation

## 2018-01-08 DIAGNOSIS — I6200 Nontraumatic subdural hemorrhage, unspecified: Secondary | ICD-10-CM | POA: Insufficient documentation

## 2018-01-08 DIAGNOSIS — M1990 Unspecified osteoarthritis, unspecified site: Secondary | ICD-10-CM | POA: Insufficient documentation

## 2018-01-08 LAB — BASIC METABOLIC PANEL
Anion gap: 8 (ref 5–15)
BUN: 21 mg/dL (ref 8–23)
CO2: 28 mmol/L (ref 22–32)
Calcium: 9.5 mg/dL (ref 8.9–10.3)
Chloride: 105 mmol/L (ref 98–111)
Creatinine, Ser: 0.97 mg/dL (ref 0.44–1.00)
GFR calc Af Amer: >60 mL/min (ref >60)
GFR calc non Af Amer: 55 mL/min — ABNORMAL LOW (ref >60)
Glucose, Bld: 137 mg/dL — ABNORMAL HIGH (ref 70–99)
Potassium: 4 mmol/L (ref 3.5–5.1)
Sodium: 141 mmol/L (ref 135–145)

## 2018-01-08 LAB — CBC WITH DIFFERENTIAL/PLATELET
Abs Immature Granulocytes: 0.02 10*3/uL (ref 0.00–0.07)
Basophils Absolute: 0.1 10*3/uL (ref 0.0–0.1)
Basophils Relative: 1 %
Eosinophils Absolute: 0.3 10*3/uL (ref 0.0–0.5)
Eosinophils Relative: 3 %
HCT: 42.1 % (ref 36.0–46.0)
Hemoglobin: 13.6 g/dL (ref 12.0–15.0)
Immature Granulocytes: 0 %
Lymphocytes Relative: 26 %
Lymphs Abs: 2.6 10*3/uL (ref 0.7–4.0)
MCH: 31.9 pg (ref 26.0–34.0)
MCHC: 32.3 g/dL (ref 30.0–36.0)
MCV: 98.8 fL (ref 80.0–100.0)
Monocytes Absolute: 0.9 10*3/uL (ref 0.1–1.0)
Monocytes Relative: 9 %
Neutro Abs: 6 10*3/uL (ref 1.7–7.7)
Neutrophils Relative %: 61 %
Platelets: 202 10*3/uL (ref 150–400)
RBC: 4.26 MIL/uL (ref 3.87–5.11)
RDW: 13.2 % (ref 11.5–15.5)
WBC: 9.8 10*3/uL (ref 4.0–10.5)
nRBC: 0 % (ref 0.0–0.2)

## 2018-01-08 NOTE — Progress Notes (Signed)
Modified Barium Swallow Progress Note  Patient Details  Name: Traci [name] MRN: [id number] Date of Birth: 1934/03/22  Today's Date: 01/08/2018  Modified Barium Swallow completed.  Full report located under Chart Review in the Imaging Section.  Brief recommendations include the following:  Clinical Impression  Pt presents with improved function from last MBS 12/11/2017 with noted functional oropharyngeal swallow today. Oral stage was unremarkable. Note premature spillage to the level of the pyriforms with thin liquids allowing liquids to sit for a few seconds prior to triggering a swallow; despite delayed swallow with thin liquids (which can be a normal age related change in swallowing), consistent adequate laryngeal vestibule closure and good hyolaryngeal excursion were noted across consistencies. When pressed to take larger consecutive straw sips of thin liquids note flash penetration that is consistently cleared with completion of the swallow. Defer to primary treating SLP to make clinical recommendations however with the view from this MBS recommend consider upgrade to thin liquids and diet to D3 or regular (reportedly poor appetite question improvement of this with diet upgrade) and meds to be administered whole with liquids. Thank you for this referral,   Swallow Evaluation Recommendations       SLP Diet Recommendations: Dysphagia 3 (Mech soft) solids;Regular solids;Thin liquid   Liquid Administration via: Cup;Straw   Medication Administration: Whole meds with liquid   Supervision: Intermittent supervision to cue for compensatory strategies   Compensations: Slow rate;Small sips/bites   Postural Changes: Remain semi-upright after after feeds/meals (Comment);Seated upright at 90 degrees   Oral Care Recommendations: Oral care BID     Amelia H. Roddie Mc, CCC-SLP Speech Language Pathologist    Wende Bushy 01/08/2018,9:36 AM

## 2018-01-11 ENCOUNTER — Telehealth: Payer: Self-pay

## 2018-01-11 NOTE — Telephone Encounter (Signed)
LMOVM reminding pt to send remote transmission.   

## 2018-01-12 ENCOUNTER — Telehealth: Payer: Self-pay | Admitting: Neurology

## 2018-01-12 ENCOUNTER — Encounter: Payer: Self-pay | Admitting: Neurology

## 2018-01-12 ENCOUNTER — Ambulatory Visit (INDEPENDENT_AMBULATORY_CARE_PROVIDER_SITE_OTHER): Payer: PPO | Admitting: Neurology

## 2018-01-12 VITALS — BP 132/86 | HR 46 | Ht 65.0 in

## 2018-01-12 DIAGNOSIS — I610 Nontraumatic intracerebral hemorrhage in hemisphere, subcortical: Secondary | ICD-10-CM

## 2018-01-12 DIAGNOSIS — F01518 Vascular dementia, unspecified severity, with other behavioral disturbance: Secondary | ICD-10-CM

## 2018-01-12 DIAGNOSIS — I4811 Longstanding persistent atrial fibrillation: Secondary | ICD-10-CM | POA: Diagnosis not present

## 2018-01-12 DIAGNOSIS — F0151 Vascular dementia with behavioral disturbance: Secondary | ICD-10-CM

## 2018-01-12 MED ORDER — MEMANTINE HCL 5 MG PO TABS: 5.0000 mg | ORAL_TABLET | Freq: Two times a day (BID) | ORAL | 1 refills | Status: DC

## 2018-01-12 MED ORDER — APIXABAN 2.5 MG PO TABS: 5.0000 mg | ORAL_TABLET | Freq: Two times a day (BID) | ORAL | Status: AC

## 2018-01-12 NOTE — Telephone Encounter (Signed)
I called and left a message (ok per DPR) stating the CT was going to be sent to GI and left their phone number.

## 2018-01-12 NOTE — Progress Notes (Signed)
[facility] [address] [place]. [place] [address] [phone]       OFFICE FOLLOW-UP NOTE  Traci [name] Date of Birth:  1934/07/31 Medical Record Number:  [id number]   HPI: Traci [name] is a 25 year Caucasian lady seen today for initial office follow-up visit following hospital admission for intracerebral hemorrhage in November 2019.  She is accompanied by her daughter.  History is obtained from them and review of electronic medical records.  I have personally reviewed hospital tests and imaging films in PACS. . Traci [name] is a 89-year lady with past medical history of sick sinus syndrome, hyperlipidemia, hypertension, coronary artery disease, chronic atrial fibrillation on anticoagulation with warfarin who presented on 12/10/2017 with sudden onset of facial droop and confusion.  Patient was brought in as a code stroke and CT scan showed a small right basal ganglia hemorrhage with estimated volume of 4 mL.  There is mild surrounding edema.  She was admitted to the intensive care unit where blood pressure was tightly controlled.  She was on Coumadin and INR was 2.4.  This was reversed using Kcentra and vitamin K.  She required IV labetalol and Cardene for blood pressure control.  NIH stroke scale on admission was 4 and ICH score was 1.  She did not require any neurosurgical interventions or intubation.  She had some baseline mild dementia and was little agitated and more confused in the ICU.  CT angiogram of the brain showed no AVM or aneurysm but showed hypoplastic posterior circulation with changes of diffuse intracranial atherosclerosis.  CT angiogram of the neck showed 50% left ICA stenosis and patent right carotid endarterectomy site.  Transthoracic echo showed normal ejection fraction.  LDL cholesterol was 107 mg percent.  Hemoglobin A1c was 7.9.  Patient's anticoagulation was held.  She had some sundowning and agitation for which she was started on low-dose Seroquel.  She was  previously on Effexor for depression which was continued.  Patient was transferred for rehab to [facility] where she is still currently staying.  She is noticed worsening of her baseline dementia and she is not able to cooperate and work with a therapist.  She is barely able to stand with two-person assist and is unable to walk.  The family informs me that she had was started on Aricept in the past but had agitation and hence was discontinued.  It does not appear that she has tried [place].  She is still on Effexor for depression and Seroquel for agitation.  She continues to have mild left-sided weakness.  Her blood pressure is well controlled.  She is not currently on any anticoagulation for her A. fib.  ROS:   14 system review of systems is positive for memory loss, confusion, speech difficulty, weakness, gait difficulty, facial drooping, agitation, depression and all other systems negative PMH:  Past Medical History:  Diagnosis Date  . Arthritis   . Atrial fibrillation ([place])   . Carotid artery disease ([place])    Right carotid endarectomy 1999  . Chronic anticoagulation    Followed by PMD  . Coronary atherosclerosis    a. Minor at cardiac catheterization 2005 b. cath 10/16/2014 40% prox LAD dx, otherwise minimal CAD  . Depression   . Essential hypertension   . Glucose intolerance (impaired glucose tolerance)   . History of kidney stones   . Hyperlipidemia   . Pneumonia 2010  . Sick sinus syndrome ([facility])    [employer] PPM  . Stroke ([place])  Social History:  Social History   Socioeconomic History  . Marital status: Widowed    Spouse name: Not on file  . Number of children: Not on file  . Years of education: Not on file  . Highest education level: Not on file  Occupational History  . Not on file  Social Needs  . Financial resource strain: Not on file  . Food insecurity:    Worry: Not on file    Inability: Not on file  . Transportation needs:    Medical: Not on file     Non-medical: Not on file  Tobacco Use  . Smoking status: Never Smoker  . Smokeless tobacco: Never Used  Substance and Sexual Activity  . Alcohol use: No    Alcohol/week: 0.0 standard drinks  . Drug use: No  . Sexual activity: Never  Lifestyle  . Physical activity:    Days per week: Not on file    Minutes per session: Not on file  . Stress: Not on file  Relationships  . Social connections:    Talks on phone: Not on file    Gets together: Not on file    Attends religious service: Not on file    Active member of club or organization: Not on file    Attends meetings of clubs or organizations: Not on file    Relationship status: Not on file  . Intimate partner violence:    Fear of current or ex partner: Not on file    Emotionally abused: Not on file    Physically abused: Not on file    Forced sexual activity: Not on file  Other Topics Concern  . Not on file  Social History Narrative  . Not on file    Medications:   No current facility-administered medications on file prior to visit.    Current Outpatient Medications on File Prior to Visit  Medication Sig Dispense Refill  . acetaminophen (TYLENOL) 325 MG tablet Take 2 tablets (650 mg total) by mouth every 6 (six) hours as needed for mild pain (or temp > 37.5 C (99.5 F)).    Marland Kitchen diltiazem (TIAZAC) 240 MG 24 hr capsule Take 240 mg by mouth daily.    Marland Kitchen docusate sodium (COLACE) 100 MG capsule Take 100 mg by mouth daily.    . furosemide (LASIX) 40 MG tablet Take 1 tablet (40 mg total) by mouth daily. 3 tablet 0  . glipiZIDE (GLUCOTROL) 5 MG tablet Take 2.5 mg by mouth daily before breakfast.    . losartan (COZAAR) 50 MG tablet Take 1 tablet (50 mg total) by mouth 2 (two) times daily. In the morning 180 tablet 2  . Melatonin 3 MG TABS Take 1 tablet (3 mg total) by mouth at bedtime as needed (SLEEP). 3 each 0  . metoprolol tartrate (LOPRESSOR) 50 MG tablet Take 50 mg by mouth 2 (two) times daily.  4  . omeprazole (PRILOSEC) 20 MG  capsule Take 20 mg by mouth daily.    . potassium chloride (K-DUR,KLOR-CON) 10 MEQ tablet Take 20 mEq by mouth daily.    . pravastatin (PRAVACHOL) 20 MG tablet Take 40 mg by mouth daily.   5  . QUEtiapine (SEROQUEL) 25 MG tablet Take 0.5 tablets (12.5 mg total) by mouth at bedtime. 3 tablet 0  . venlafaxine (EFFEXOR) 100 MG tablet Take 100 mg by mouth daily.   0    Allergies:   Allergies  Allergen Reactions  . Morphine Nausea Only  . Penicillins Other (  See Comments)    Has patient had a PCN reaction causing immediate rash, facial/tongue/throat swelling, SOB or lightheadedness with hypotension: NO Has patient had a PCN reaction causing severe rash involving mucus membranes or skin necrosis: no Has patient had a PCN reaction that required hospitalization: NO Has patient had a PCN reaction occurring within the last 10 years: NO If all of the above answers are "NO", then may proceed with Cephalosporin use.     Physical Exam General: well developed, well nourished elderly Caucasian lady seated, in no evident distress Head: head normocephalic and atraumatic.  Neck: supple with no carotid or supraclavicular bruits Cardiovascular: regular rate and rhythm, no murmurs Musculoskeletal: no deformity Skin:  no rash/petichiae Vascular:  Normal pulses all extremities Vitals:   01/12/18 1106  BP: 132/86  Pulse: (!) 46   Neurologic Exam Mental Status: Awake and fully alert. Disoriented to place and time. Recent and remote memory poor. Attention span, concentration and fund of knowledge diminished. MMSE not done. Recall 0/3. Clock drawing 1/4. Able to name only 3 animals with 4 legs.. Mood and affect appropriate.  Cranial Nerves: Fundoscopic exam reveals sharp disc margins. Pupils equal, briskly reactive to light. Extraocular movements full without nystagmus. Visual fields full to confrontation. Hearing intact. Facial sensation intact. Face, tongue, palate moves normally and symmetrically.  Motor:  Left hemiparesis 4/5 strength with weakness of left grip, intrinsic hand muscles, left hip flexors and ankle dorsiflexors.  Tone is increased on the left compared to the right.  Normal strength on the right side. Sensory.: intact to touch ,pinprick .position and vibratory sensation.  Coordination: Rapid alternating movements normal in all extremities. Finger-to-nose and heel-to-shin performed accurately bilaterally. Gait and Station: Deferred as patient is unable to walk even with 2 person assist. Reflexes: 1+ and asymmetric brisker on the left. Toes downgoing.     Modified Rankin  4  ASSESSMENT: 64 year patient with right basal ganglia hemorrhage secondary to hypertension and warfarin coagulopathy in November 2019 with residual left hemi-paresis and worsening of baseline vascular dementia.     PLAN: I had a long discussion with the patient and her daughter regarding her recent intracerebral hemorrhage on warfarin and history of atrial fibrillation and vascular dementia which appears to have now progressed.  I recommend trial of Namenda start 5 mg twice daily for a month increase if tolerated to 10 mg twice daily.  Continue ongoing physical occupational therapy.  Check CT scan of the head without contrast and if intracerebral hemorrhage is much improved start Eliquis 5 mg twice daily for anticoagulation to prevent further thromboembolism and strokes as it ahs a lower rick of bleeding than warfarin..  I have discussed risk benefit with the patient and daughter and answered questions.  She will return for follow-up in the future as needed only. Greater than 50% of time during this 25 minute visit was spent on counseling,explanation of diagnosis, planning of further management, discussion with patient and family and coordination of care Traci Contras, MD  [facility] [address] [place], [place] [address]  Phone [phone] Fax [phone] Note: This document  was prepared with digital dictation and possible smart phrase technology. Any transcriptional errors that result from this process are unintentional

## 2018-01-12 NOTE — Patient Instructions (Signed)
I had a long discussion with the patient and her daughter regarding her recent intracerebral hemorrhage on warfarin and history of atrial fibrillation and vascular dementia which appears to have now progressed.  I recommend trial of Namenda start 5 mg twice daily for a month increase if tolerated to 10 mg twice daily.  Continue ongoing physical occupational therapy.  Check CT scan of the head without contrast and if intracerebral hemorrhage is much improved start Eliquis 5 mg twice daily for anticoagulation to prevent further thromboembolism and strokes as it ahs a lower rick of bleeding than warfarin..  I have discussed risk benefit with the patient and daughter and answered questions.  She will return for follow-up in the future as needed only.

## 2018-01-13 ENCOUNTER — Encounter: Payer: Self-pay | Admitting: Cardiology

## 2018-01-14 ENCOUNTER — Non-Acute Institutional Stay (SKILLED_NURSING_FACILITY): Payer: PPO | Admitting: Internal Medicine

## 2018-01-14 ENCOUNTER — Encounter: Payer: Self-pay | Admitting: Internal Medicine

## 2018-01-14 DIAGNOSIS — I4891 Unspecified atrial fibrillation: Secondary | ICD-10-CM

## 2018-01-14 DIAGNOSIS — I10 Essential (primary) hypertension: Secondary | ICD-10-CM

## 2018-01-14 DIAGNOSIS — J209 Acute bronchitis, unspecified: Secondary | ICD-10-CM

## 2018-01-14 DIAGNOSIS — I5032 Chronic diastolic (congestive) heart failure: Secondary | ICD-10-CM

## 2018-01-14 DIAGNOSIS — J440 Chronic obstructive pulmonary disease with acute lower respiratory infection: Secondary | ICD-10-CM

## 2018-01-14 NOTE — Progress Notes (Deleted)
Location:    [place] Room Number: 103/P Place of Service:  SNF [phone]) Provider:  Veleta Miners MD  Celene Squibb, MD  Patient Care Team: Celene Squibb, MD as PCP - General (Internal Medicine) Evans Lance, MD as Consulting Physician (Cardiology)  Extended Emergency Contact Information Primary Emergency Contact: Raylene Everts Address[address], [place] [address] Johnnette Litter of [place] Phone: [phone] Mobile Phone: [phone] Relation: Daughter Secondary Emergency Contact: Benjamine Mola [place] of [place] Mobile Phone: [phone] Relation: Granddaughter  Code Status:  DNR Goals of care: Advanced Directive information Advanced Directives 01/14/2018  Does Patient Have a Medical Advance Directive? Yes  Type of Advance Directive Out of facility DNR (pink MOST or yellow form)  Does patient want to make changes to medical advance directive? No - Patient declined  Copy of [place] in Chart? -  Would patient like information on creating a medical advance directive? No - Patient declined  Pre-existing out of facility DNR order (yellow form or pink MOST form) -     Chief Complaint  Patient presents with  . Acute Visit    Wheezing    HPI:  Pt is a 83 y.o. female seen today for an acute visit for    Past Medical History:  Diagnosis Date  . Arthritis   . Atrial fibrillation ([place])   . Carotid artery disease ([place])    Right carotid endarectomy 1999  . Chronic anticoagulation    Followed by PMD  . Coronary atherosclerosis    a. Minor at cardiac catheterization 2005 b. cath 10/16/2014 40% prox LAD dx, otherwise minimal CAD  . Depression   . Essential hypertension   . Glucose intolerance (impaired glucose tolerance)   . History of kidney stones   . Hyperlipidemia   . Pneumonia 2010  . Sick sinus syndrome (H[facility])    [employer]tr[employer] PPM  . Stroke [facility])    Past Surgical History:  Procedure Laterality Date  .  ABDOMINAL HYSTERECTOMY    . APPENDECTOMY    . BREAST BIOPSY Bilateral    x 7 total  . CARDIAC CATHETERIZATION N/A 10/16/2014   Procedure: Left Heart Cath and Coronary Angiography;  Surgeon: Leonie Man, MD;  Location: [place] CV LAB;  Service: Cardiovascular;  Laterality: N/A;  . CAROTID ENDARTERECTOMY Right 1999  . CATARACT EXTRACTION W/ INTRAOCULAR LENS  IMPLANT, BILATERAL Bilateral   . COLONOSCOPY     2006  . CYSTOSCOPY W/ URETERAL STENT PLACEMENT Left 04/19/2012   Procedure: CYSTOSCOPY WITH RETROGRADE PYELOGRAM/URETERAL STENT PLACEMENT ;  Surgeon: Ailene Rud, MD;  [facility];  Service: Urology;  Laterality: Left;  . CYSTOSCOPY WITH RETROGRADE PYELOGRAM, URETEROSCOPY AND STENT PLACEMENT Left 06/30/2012   Procedure: CYSTOSCOPY WITH LEFT  RETROGRADE PYELOGRAM, URETEROSCOPY  with basketing of stone, AND STENT PLACEMENT, TRANSURETHRAL UNROOFING OF URETER.;  Surgeon: Alexis Frock, MD;  [facility];  Service: Urology;  Laterality: Left;  . INSERT / REPLACE / REMOVE PACEMAKER  2006  . OPEN REDUCTION INTERNAL FIXATION (ORIF) DISTAL RADIAL FRACTURE Right 04/29/2016   Procedure: OPEN REDUCTION INTERNAL FIXATION (ORIF) DISTAL RADIAL FRACTURE;  Surgeon: Roseanne Kaufman, MD;  Location: [place];  Service: Orthopedics;  Laterality: Right;  . ORIF DISTAL RADIUS FRACTURE Right 04/29/2016    Allergies  Allergen Reactions  . Morphine Nausea Only  . Penicillins Other (See Comments)    Has patient had a PCN reaction causing immediate  rash, facial/tongue/throat swelling, SOB or lightheadedness with hypotension: NO Has patient had a PCN reaction causing severe rash involving mucus membranes or skin necrosis: no Has patient had a PCN reaction that required hospitalization: NO Has patient had a PCN reaction occurring within the last 10 years: NO If all of the above answers are "NO", then may proceed with Cephalosporin use.     No facility-administered encounter medications on file as  of 01/14/2018.    Outpatient Encounter Medications as of 01/14/2018  Medication Sig  . acetaminophen (TYLENOL) 325 MG tablet Take 2 tablets (650 mg total) by mouth every 6 (six) hours as needed for mild pain (or temp > 37.5 C (99.5 F)).  Marland Kitchen diltiazem (TIAZAC) 240 MG 24 hr capsule Take 240 mg by mouth daily.  Marland Kitchen docusate sodium (COLACE) 100 MG capsule Take 100 mg by mouth daily.  . furosemide (LASIX) 40 MG tablet Take 1 tablet (40 mg total) by mouth daily.  Marland Kitchen glipiZIDE (GLUCOTROL) 5 MG tablet Take 2.5 mg by mouth daily before breakfast.  . losartan (COZAAR) 50 MG tablet Take 1 tablet (50 mg total) by mouth 2 (two) times daily. In the morning  . Melatonin 3 MG TABS Take 1 tablet (3 mg total) by mouth at bedtime as needed (SLEEP).  . memantine (NAMENDA) 10 MG tablet Take 10 mg by mouth 2 (two) times daily.  . memantine (NAMENDA) 5 MG tablet Take 5 mg by mouth 2 (two) times daily. Form 01/13/2018-02/13/2018  . metoprolol tartrate (LOPRESSOR) 50 MG tablet Take 50 mg by mouth 2 (two) times daily.  Marland Kitchen omeprazole (PRILOSEC) 20 MG capsule Take 20 mg by mouth daily.  . potassium chloride (K-DUR,KLOR-CON) 10 MEQ tablet Take 20 mEq by mouth daily.  . pravastatin (PRAVACHOL) 20 MG tablet Take 40 mg by mouth daily.   . QUEtiapine (SEROQUEL) 25 MG tablet Take 0.5 tablets (12.5 mg total) by mouth at bedtime.  Marland Kitchen venlafaxine (EFFEXOR) 100 MG tablet Take 100 mg by mouth daily.   . [DISCONTINUED] memantine (NAMENDA) 5 MG tablet Take 1 tablet (5 mg total) by mouth 2 (two) times daily. Start 1 tablet twice daily x 1 month and then increase to 2 tablets twice daily (Patient taking differently: Take 10 mg by mouth 2 (two) times daily. Start 1 tablet twice daily x 1 month and then increase to 2 tablets twice daily)     Review of Systems  Immunization History  Administered Date(s) Administered  . Influenza,inj,Quad PF,6+ Mos 10/14/2014  . Pneumococcal Conjugate-13 04/06/2013   Pertinent  Health Maintenance Due    Topic Date Due  . INFLUENZA VACCINE  01/15/2018 (Originally 08/27/2017)  . FOOT EXAM  01/15/2018 (Originally 09/10/1944)  . OPHTHALMOLOGY EXAM  01/15/2018 (Originally 09/10/1944)  . PNA vac Low Risk Adult (2 of 2 - PPSV23) 01/15/2018 (Originally 04/07/2014)  . URINE MICROALBUMIN  01/16/2018 (Originally 09/10/1944)  . HEMOGLOBIN A1C  06/12/2018  . DEXA SCAN  Completed   Fall Risk  01/12/2018 04/06/2013  Falls in the past year? 0 No   Functional Status Survey:    Vitals:   01/14/18 0951  BP: (!) 155/60  Pulse: 62  Resp: 20  Temp: 97.6 F (36.4 C)  TempSrc: Oral  SpO2: 95%   There is no height or weight on file to calculate BMI. Physical Exam  Labs reviewed: Recent Labs    12/10/17 2336  12/15/17 0520  12/17/17 0700 12/24/17 0539 01/08/18 0700  NA 139   < > 140   < >  140 137 141  K 3.6   < > 3.0*   < > 3.6 4.4 4.0  CL 105   < > 105   < > 104 103 105  CO2 27   < > 27   < > 27 26 28   GLUCOSE 170*   < > 164*   < > 161* 130* 137*  BUN 16   < > 14   < > 14 21 21   CREATININE 0.94   < > 0.98   < > 0.73 0.99 0.97  CALCIUM 8.7*   < > 9.1   < > 9.1 9.6 9.5  MG 1.8  --  1.8  --   --   --   --    < > = values in this interval not displayed.   Recent Labs    11/24/17 1712 12/10/17 0701  AST 20 21  ALT 16 17  ALKPHOS 100 86  BILITOT 0.6 0.7  PROT 7.4 7.5  ALBUMIN 3.6 3.6   Recent Labs    12/14/17 0454  12/19/2017 0426 12/17/17 0700 01/08/18 0700  WBC 10.9*   < > 13.1* 11.2* 9.8  NEUTROABS 7.4  --   --  7.1 6.0  HGB 11.7*   < > 12.5 12.3 13.6  HCT 36.5   < > 38.6 38.4 42.1  MCV 99.5   < > 97.2 98.2 98.8  PLT 179   < > 234 252 202   < > = values in this interval not displayed.   Lab Results  Component Value Date   TSH 2.115 12/16/2013   Lab Results  Component Value Date   HGBA1C 7.9 (H) 12/12/2017   Lab Results  Component Value Date   CHOL 170 12/12/2017   HDL 33 (L) 12/12/2017   LDLCALC 107 (H) 12/12/2017   TRIG 148 12/12/2017   CHOLHDL 5.2 12/12/2017     Significant Diagnostic Results in last 30 days:  Dg Op Swallowing Func-medicare/speech Path  Result Date: 01/08/2018 Objective Swallowing Evaluation: Type of Study: MBS-Modified Barium Swallow Study  Patient Details Name: Traci [name] MRN: [id number] Date of Birth: 15-Dec-1934 Today's Date: 01/08/2018 Time: SLP Start Time (ACUTE ONLY): 5038 -SLP Stop Time (ACUTE ONLY): 0847 SLP Time Calculation (min) (ACUTE ONLY): 12 min Past Medical History: Past Medical History: Diagnosis Date . Arthritis  . Atrial fibrillation ([place])  . Carotid artery disease ([place])   Right carotid endarectomy 1999 . Chronic anticoagulation   Followed by PMD . Coronary atherosclerosis   a. Minor at cardiac catheterization 2005 b. cath 10/16/2014 40% prox LAD dx, otherwise minimal CAD . Depression  . Essential hypertension  . Glucose intolerance (impaired glucose tolerance)  . History of kidney stones  . Hyperlipidemia  . Pneumonia 2010 . Sick sinus syndrome (H[facility])   Medtronic PPM Past Surgical History: Past Surgical History: Procedure Laterality Date . ABDOMINAL HYSTERECTOMY   . APPENDECTOMY   . BREAST BIOPSY Bilateral   x 7 total . CARDIAC CATHETERIZATION N/A 10/16/2014  Procedure: Left Heart Cath and Coronary Angiography;  Surgeon: Leonie Man, MD;  Location: [place] [facility];  Service: Cardiovascular;  Laterality: N/A; . CAROTID ENDARTERECTOMY Right 1999 . CATARACT EXTRACTION W/ INTRAOCULAR LENS  IMPLANT, BILATERAL Bilateral  . COLONOSCOPY    2006 . CYSTOSCOPY W/ URETERAL STENT PLACEMENT Left 04/19/2012  Procedure: CYSTOSCOPY WITH RETROGRADE PYELOGRAM/URETERAL STENT PLACEMENT ;  Surgeon: Ailene Rud, MD;  [facility];  Service: Urology;  Laterality: Left; . CYSTOSCOPY WITH RETROGRADE PYELOGRAM,  URETEROSCOPY AND STENT PLACEMENT Left 06/30/2012  Procedure: CYSTOSCOPY WITH LEFT  RETROGRADE PYELOGRAM, URETEROSCOPY  with basketing of stone, AND STENT PLACEMENT, TRANSURETHRAL UNROOFING OF URETER.;  Surgeon: Alexis Frock, MD;   [facility];  Service: Urology;  Laterality: Left; . INSERT / REPLACE / REMOVE PACEMAKER  2006 . OPEN REDUCTION INTERNAL FIXATION (ORIF) DISTAL RADIAL FRACTURE Right 04/29/2016  Procedure: OPEN REDUCTION INTERNAL FIXATION (ORIF) DISTAL RADIAL FRACTURE;  Surgeon: Roseanne Kaufman, MD;  Location: [place];  Service: Orthopedics;  Laterality: Right; . ORIF DISTAL RADIUS FRACTURE Right 04/29/2016 HPI: Pt is an 83 y.o. female whose CT showed a R lentiform hemorrhage with mild surrounding edema with recent admission to acute care 12/10/2017. PMH includes: dementia (living in ILF), sick sinus syndrome, hyperlipidemia, essential hypertension, CAD, chronic anticoagulation, atrial fibrillation, carotid artery disease, PNA. Pt is currently a resident at SNF where she has been [place] and is on a D2/NTL diet. SLP has requested repeat MBS to determine appropriateness of upgraded textures and/or consistencies.  Subjective: pt alert, pleasant, but confused Assessment / Plan / Recommendation CHL IP CLINICAL IMPRESSIONS 01/08/2018 Clinical Impression Pt presents with improved function from last MBS 12/11/2017 with noted functional oropharyngeal swallow today. Oral stage was unremarkable. Note premature spillage to the level of the pyriforms with thin liquids allowing liquids to sit for a few seconds prior to triggering a swallow; despite delayed swallow with thin liquids (which can be a normal age related change in swallowing), consistent adequate laryngeal vestibule closure and good hyolaryngeal excursion were noted across consistencies. When pressed to take larger consecutive straw sips of thin liquids note flash penetration that is consistently cleared with completion of the swallow. Defer to primary treating SLP to make clinical recommendations however with the view from this MBS recommend consider upgrade to thin liquids and diet to D3 or regular (reportedly poor appetite question improvement of this with diet  upgrade) and meds to be administered whole with liquids. Thank you for this referral, SLP Visit Diagnosis Dysphagia, oropharyngeal phase (R13.12) Attention and concentration deficit following -- Frontal lobe and executive function deficit following -- Impact on safety and function Mild aspiration risk   CHL IP TREATMENT RECOMMENDATION 12/11/2017 Treatment Recommendations Therapy as outlined in treatment plan below   Prognosis 01/08/2018 Prognosis for Safe Diet Advancement Good Barriers to Reach Goals Cognitive deficits Barriers/Prognosis Comment -- CHL IP DIET RECOMMENDATION 01/08/2018 SLP Diet Recommendations Dysphagia 3 (Mech soft) solids;Regular solids;Thin liquid Liquid Administration via Cup;Straw Medication Administration Whole meds with liquid Compensations Slow rate;Small sips/bites Postural Changes Remain semi-upright after after feeds/meals (Comment);Seated upright at 90 degrees   CHL IP OTHER RECOMMENDATIONS 01/08/2018 Recommended Consults -- Oral Care Recommendations Oral care BID Other Recommendations --   CHL IP FOLLOW UP RECOMMENDATIONS 01/08/2018 Follow up Recommendations Skilled Nursing facility   [facility] IP FREQUENCY AND DURATION 12/11/2017 Speech Therapy Frequency (ACUTE ONLY) min 2x/week Treatment Duration 2 weeks      CHL IP ORAL PHASE 01/08/2018 Oral Phase Impaired Oral - Pudding Teaspoon -- Oral - Pudding Cup -- Oral - Honey Teaspoon -- Oral - Honey Cup -- Oral - Nectar Teaspoon -- Oral - Nectar Cup -- Oral - Nectar Straw -- Oral - Thin Teaspoon -- Oral - Thin Cup Premature spillage Oral - Thin Straw Premature spillage Oral - Puree -- Oral - Mech Soft -- Oral - Regular -- Oral - Multi-Consistency -- Oral - Pill -- Oral Phase - Comment --  CHL IP PHARYNGEAL PHASE 01/08/2018 Pharyngeal Phase Impaired Pharyngeal- Pudding  Teaspoon -- Pharyngeal -- Pharyngeal- Pudding Cup -- Pharyngeal -- Pharyngeal- Honey Teaspoon -- Pharyngeal -- Pharyngeal- Honey Cup -- Pharyngeal -- Pharyngeal- Nectar Teaspoon --  Pharyngeal -- Pharyngeal- Nectar Cup -- Pharyngeal -- Pharyngeal- Nectar Straw -- Pharyngeal -- Pharyngeal- Thin Teaspoon -- Pharyngeal -- Pharyngeal- Thin Cup -- Pharyngeal -- Pharyngeal- Thin Straw Penetration/Aspiration during swallow Pharyngeal Material enters airway, remains ABOVE vocal cords then ejected out Pharyngeal- Puree -- Pharyngeal -- Pharyngeal- Mechanical Soft -- Pharyngeal -- Pharyngeal- Regular -- Pharyngeal -- Pharyngeal- Multi-consistency -- Pharyngeal -- Pharyngeal- Pill -- Pharyngeal -- Pharyngeal Comment --  CHL IP CERVICAL ESOPHAGEAL PHASE 12/11/2017 Cervical Esophageal Phase WFL Pudding Teaspoon -- Pudding Cup -- Honey Teaspoon -- Honey Cup -- Nectar Teaspoon -- Nectar Cup -- Nectar Straw -- Thin Teaspoon -- Thin Cup -- Thin Straw -- Puree -- Mechanical Soft -- Regular -- Multi-consistency -- Pill -- Cervical Esophageal Comment -- Amelia H. Roddie Mc, CCC-SLP Speech Language Pathologist Wende Bushy 01/08/2018, 9:41 AM            CLINICAL DATA:  Dysphagia EXAM: MODIFIED BARIUM SWALLOW TECHNIQUE: Different consistencies of barium were administered orally to the patient by the Speech Pathologist. Imaging of the pharynx was performed in the lateral projection. FLUOROSCOPY TIME:  Fluoroscopy Time:  1 minutes 54 seconds Radiation Exposure Index (if provided by the fluoroscopic device): 19.3 mGy Number of Acquired Spot Images: multiple fluoroscopic screen captures COMPARISON:  None FINDINGS: Thin liquid-multiple swallows were assessed with patient swallowing sips by cup and sequential swallows by straw. Premature spillover to the vallecular and piriform sinuses identified. Flash laryngeal penetration seen. No aspiration. Minimal vallecular residual on several swallows. Nectar thick liquid-not evaluated Honey-not evaluated Pure-normal motion without laryngeal penetration, aspiration or residuals. Cracker-not evaluated Pure with cracker-normal motion without laryngeal penetration,  aspiration, or residuals Barium tablet-swallowed barium tablet without difficulty utilizing thin barium. Pill passed to stomach. IMPRESSION: Flash laryngeal penetration of thin barium during swallows by cup and with sequential swallows by straw. No aspiration. Please refer to the Speech Pathologists report for complete details and recommendations. Electronically Signed   By: Lavonia Dana M.D.   On: 01/08/2018 09:08    Assessment/Plan There are no diagnoses linked to this encounter.   Family/ staff Communication:   Labs/tests ordered:

## 2018-01-15 ENCOUNTER — Other Ambulatory Visit: Payer: Self-pay | Admitting: *Deleted

## 2018-01-15 MED ORDER — MORPHINE SULFATE 20 MG/5ML PO SOLN: ORAL | 0 refills | Status: AC

## 2018-01-15 NOTE — Telephone Encounter (Signed)
Per Ar[place]. Added medication to patient chart. [facility] patient.  Pended and sent to [facility] for approval.

## 2018-01-18 ENCOUNTER — Encounter (HOSPITAL_COMMUNITY)
Admission: RE | Admit: 2018-01-18 | Discharge: 2018-01-18 | Disposition: A | Discharge status: Home / Self Care | Payer: PPO | Source: Skilled Nursing Facility | Attending: Internal Medicine | Admitting: Internal Medicine

## 2018-01-18 DIAGNOSIS — I6200 Nontraumatic subdural hemorrhage, unspecified: Secondary | ICD-10-CM | POA: Insufficient documentation

## 2018-01-18 DIAGNOSIS — M1990 Unspecified osteoarthritis, unspecified site: Secondary | ICD-10-CM | POA: Insufficient documentation

## 2018-01-18 DIAGNOSIS — I739 Peripheral vascular disease, unspecified: Secondary | ICD-10-CM | POA: Insufficient documentation

## 2018-01-18 DIAGNOSIS — R0602 Shortness of breath: Secondary | ICD-10-CM | POA: Insufficient documentation

## 2018-01-18 DIAGNOSIS — M6281 Muscle weakness (generalized): Secondary | ICD-10-CM | POA: Insufficient documentation

## 2018-01-18 NOTE — Progress Notes (Deleted)
Location: [facility] Room Number: 103/P  Place of Service: SNF [phone])  Provider: Veleta Miners MD  Celene Squibb, MD  Patient Care Team:  Celene Squibb, MD as PCP - General (Internal Medicine)  Evans Lance, MD as Consulting Physician (Cardiology)  Extended Emergency Contact Information  Primary Emergency Contact: Raylene Everts  Address: [address]  [place], [place] [address] Johnnette Litter of [place] Phone: [phone]  Mobile Phone: [phone]  Relation: Daughter  Secondary Emergency Contact: Benjamine Mola [place] of [place]  Mobile Phone: [phone]  Relation: Granddaughter  Code Status: DNR  Goals of care: Advanced Directive information  Advanced Directives 01/14/2018  Does Patient Have a Medical Advance Directive? Yes  Type of Advance Directive Out of facility DNR (pink MOST or yellow form)  Does patient want to make changes to medical advance directive? No - Patient declined  Copy of [place] in Chart? -  Would patient like information on creating a medical advance directive? No - Patient declined  Pre-existing out of facility DNR order (yellow form or pink MOST form) -       Chief Complaint  Patient presents with  . Acute Visit    Wheezing   HPI:  Pt is a 83 y.o. female seen today for an acute visit for Cough and Wheezing as noticed by Nurses.  Patient is in SNF for therapy and possible long-term care  She stayed in the hospital from 11/14-11/20 for right cerebral hemorrhage  Patient has a history of chronic A. fib on Coumadin, coronary artery disease, peripheral vascular disease, hypertension, type 2 diabetes and dementia.  H/o Sick sinus has pacemaker  Patient came to the ED as family noticed that she had facial droop.  She was found to have a right lentiform hemorrhage with mild edema.  She was given at [facility] for reversal as she is on Coumadin for her A. Fib  Patient also had Hypoxia and had a CT scan of her  chest done which showed acute CHF. Echo showed diastolic dysfunction. She was diuresed with Lasix and eventually improved.  She also had some problems with agitation especially in the evening in the hospital and was started on low-dose of Seroquel.  Patient was seen today as the nurses had noticed that she was coughing and had some wheezing.  Patient is unable to give me any history due to her dementia. Denies any shortness of breath or cough or fever or chest pain      Past Medical History:  Diagnosis Date  . Arthritis   . Atrial fibrillation ([place])   . Carotid artery disease ([place])    Right carotid endarectomy 1999  . Chronic anticoagulation    Followed by PMD  . Coronary atherosclerosis    a. Minor at cardiac catheterization 2005 b. cath 10/16/2014 40% prox LAD dx, otherwise minimal CAD  . Depression   . Essential hypertension   . Glucose intolerance (impaired glucose tolerance)   . History of kidney stones   . Hyperlipidemia   . Pneumonia 2010  . Sick sinus syndrome (HCC)    Medtronic PPM  . Stroke [facility])         Past Surgical History:  Procedure Laterality Date  . ABDOMINAL HYSTERECTOMY    . APPENDECTOMY    . BREAST BIOPSY Bilateral    x 7 total  . CARDIAC CATHETERIZATION N/A 10/16/2014   Procedure: Left Heart Cath and Coronary Angiography; Surgeon: Leonie Man, MD; Location:  [facility] INVASIVE CV LAB; Service: Cardiovascular; Laterality: N/A;  . CAROTID ENDARTERECTOMY Right 1999  . CATARACT EXTRACTION W/ INTRAOCULAR LENS IMPLANT, BILATERAL Bilateral   . COLONOSCOPY     2006  . CYSTOSCOPY W/ URETERAL STENT PLACEMENT Left 04/19/2012   Procedure: CYSTOSCOPY WITH RETROGRADE PYELOGRAM/URETERAL STENT PLACEMENT ; Surgeon: Ailene Rud, MD; [facility]: [facility]; Service: Urology; Laterality: Left;  . CYSTOSCOPY WITH RETROGRADE PYELOGRAM, URETEROSCOPY AND STENT PLACEMENT Left 06/30/2012   Procedure: CYSTOSCOPY WITH LEFT RETROGRADE PYELOGRAM, URETEROSCOPY with basketing of stone, AND  STENT PLACEMENT, TRANSURETHRAL UNROOFING OF URETER.; Surgeon: Alexis Frock, MD; [facility]; Service: Urology; Laterality: Left;  . INSERT / REPLACE / REMOVE PACEMAKER  2006  . OPEN REDUCTION INTERNAL FIXATION (ORIF) DISTAL RADIAL FRACTURE Right 04/29/2016   Procedure: OPEN REDUCTION INTERNAL FIXATION (ORIF) DISTAL RADIAL FRACTURE; Surgeon: Roseanne Kaufman, MD; Location: [facility]; Service: Orthopedics; Laterality: Right;  . ORIF DISTAL RADIUS FRACTURE Right 04/29/2016        Allergies  Allergen Reactions  . Morphine Nausea Only  . Penicillins Other (See Comments)    Has patient had a PCN reaction causing immediate rash, facial/tongue/throat swelling, SOB or lightheadedness with hypotension: NO  Has patient had a PCN reaction causing severe rash involving mucus membranes or skin necrosis: no  Has patient had a PCN reaction that required hospitalization: NO  Has patient had a PCN reaction occurring within the last 10 years: NO  If all of the above answers are "NO", then may proceed with Cephalosporin use.    No facility-administered encounter medications on file as of 01/14/2018.        Outpatient Encounter Medications as of 01/14/2018  Medication Sig  . acetaminophen (TYLENOL) 325 MG tablet Take 2 tablets (650 mg total) by mouth every 6 (six) hours as needed for mild pain (or temp > 37.5 C (99.5 F)).  Marland Kitchen diltiazem (TIAZAC) 240 MG 24 hr capsule Take 240 mg by mouth daily.  Marland Kitchen docusate sodium (COLACE) 100 MG capsule Take 100 mg by mouth daily.  . furosemide (LASIX) 40 MG tablet Take 1 tablet (40 mg total) by mouth daily.  Marland Kitchen glipiZIDE (GLUCOTROL) 5 MG tablet Take 2.5 mg by mouth daily before breakfast.  . losartan (COZAAR) 50 MG tablet Take 1 tablet (50 mg total) by mouth 2 (two) times daily. In the morning  . Melatonin 3 MG TABS Take 1 tablet (3 mg total) by mouth at bedtime as needed (SLEEP).  . memantine (NAMENDA) 10 MG tablet Take 10 mg by mouth 2 (two) times daily.  . memantine  (NAMENDA) 5 MG tablet Take 5 mg by mouth 2 (two) times daily. Form 01/13/2018-02/13/2018  . metoprolol tartrate (LOPRESSOR) 50 MG tablet Take 50 mg by mouth 2 (two) times daily.  Marland Kitchen omeprazole (PRILOSEC) 20 MG capsule Take 20 mg by mouth daily.  . potassium chloride (K-DUR,KLOR-CON) 10 MEQ tablet Take 20 mEq by mouth daily.  . pravastatin (PRAVACHOL) 20 MG tablet Take 40 mg by mouth daily.   . QUEtiapine (SEROQUEL) 25 MG tablet Take 0.5 tablets (12.5 mg total) by mouth at bedtime.  Marland Kitchen venlafaxine (EFFEXOR) 100 MG tablet Take 100 mg by mouth daily.   . [DISCONTINUED] memantine (NAMENDA) 5 MG tablet Take 1 tablet (5 mg total) by mouth 2 (two) times daily. Start 1 tablet twice daily x 1 month and then increase to 2 tablets twice daily (Patient taking differently: Take 10 mg by mouth 2 (two) times daily. Start 1 tablet twice daily x 1 month and  then increase to 2 tablets twice daily)   Review of Systems      Immunization History  Administered Date(s) Administered  . Influenza,inj,Quad PF,6+ Mos 10/14/2014  . Pneumococcal Conjugate-13 04/06/2013       Pertinent Health Maintenance Due  Topic Date Due  . INFLUENZA VACCINE  01/15/2018 (Originally 08/27/2017)  . FOOT EXAM  01/15[birth year]2018 (Originally 09/10/1944)  . OPHTHALMOLOGY EXAM  01/15/2018 (Originally 09/10/1944)  . PNA vac Low Risk Adult (2 of 2 - PPSV23) 01/15/2018 (Originally 04/07/2014)  . URINE MICROALBUMIN  01/16/2018 (Originally 09/10/1944)  . HEMOGLOBIN A1C  06/12/2018  . DEXA SCAN  Completed   Fall Risk  01/12/2018 04/06/2013  Falls in the past year? 0 No   Functional Status Survey:      Vitals:   01/14/18 0951  BP: (!) 155/60  Pulse: 62  Resp: 20  Temp: 97.6 F (36.4 C)  TempSrc: Oral  SpO2: 95%   There is no height or weight on file to calculate BMI.  Physical Exam  Labs reviewed:  Recent Labs (within last 365 days)                                                                                                                        Recent Labs (within last 365 days)                                              Recent Labs (within last 365 days)                                                                         Recent Labs                       Recent Labs                       Recent Labs                                               Significant Diagnostic Results in last 30 days:   Imaging Results     Assessment/Plan  Acute Bronchitis  Will start her on Duo Nebs for few Days.  Prednisone 30 mg for 5 Days  No Need for Antibiotics right now.  Continue to monitor Vitals.  Intracerebral Hemorrhage  BP controlled  Per Neuorolgy Repeated CT scan Ordered. An dpossible Eliquis if Hemorrhage resolved.  Diabetes mellitus  Was taken off her Meds in hospital  Accu checks Show morning BS are less then 200  Continue her on Glucotrol 2.5 Mg  Chronic diastolic heart failure  Patient weight stable on Lasix  Will repeat Chst Xray if Wheezing not improve.  Follow renal Fucntion  Essential hypertension  metoprolol and Losartan  Atrial fibrillation,  Rate control on Metoprolol  Not on anticoagulation right now  Possibl;e Eliquis after CT scan of Head  Dementia with agitation  On Effexor and Seroquel  Was satrtred on Namends by Neurology  Will eventually taper off her Seroquel  Hyperlipidemia  On statin LDL was 107  Increased Pravachol to 40 mg  Family/ staff Communication:  Labs/tests ordered:

## 2018-01-27 DEATH — deceased

## 2018-02-09 NOTE — Progress Notes (Signed)
Location:    [facility] Room Number: 103/P Place of Service:  SNF [id number]) Provider:  Veleta Miners MD  Celene Squibb, MD  Patient Care Team: Celene Squibb, MD as PCP - General (Internal Medicine) Evans Lance, MD as Consulting Physician (Cardiology)  Extended Emergency Contact Information Primary Emergency Contact: Raylene Everts Address: [address] [place] [address] Johnnette Litter of [place] Phone: [phone] Mobile Phone: [phone] Relation: Daughter Secondary Emergency Contact: Benjamine Mola [place] of [place] Mobile Phone: [phone] Relation: Granddaughter  Code Status:  DNR Goals of care: Advanced Directive information Advanced Directives 01/14/2018  Does Patient Have a Medical Advance Directive? Yes  Type of Advance Directive Out of facility DNR (pink MOST or yellow form)  Does patient want to make changes to medical advance directive? No - Patient declined  Copy of [place] in Chart? -  Would patient like information on creating a medical advance directive? No - Patient declined  Pre-existing out of facility DNR order (yellow form or pink MOST form) -     Chief Complaint  Patient presents with  . Acute Visit    Wheezing    HPI:  Pt is a 84 y.o. female seen today for an acute visit for Cough and Wheezing as noticed by Nurses. Patient is in SNF for therapy and possible long-term care She stayed in the hospital from 11/14-11/20 for right cerebral hemorrhage  Patient has a history of chronic A. fib on Coumadin, coronary artery disease, peripheral vascular disease, hypertension, type 2 diabetes and dementia. H/o Sick sinus has pacemaker  Patient came to the  ED as family noticed that she had facial droop. She was found to have a right lentiform hemorrhage with mild edema. She was given at [facility] for reversal as she is on Coumadin for her A. Fib Patient also had Hypoxia and had a CT scan of her  chest done which showed acute CHF.  Echo showed diastolic dysfunction.  She was diuresed with Lasix and eventually improved.   She also had some problems with agitation especially in the evening in the hospital and was started on low-dose of Seroquel. Patient was seen today as the nurses had noticed that she was coughing and had some wheezing. Patient is unable to give me any history due to her dementia.  Denies any shortness of breath or cough or fever or chest pain   Past Medical History:  Diagnosis Date  . Arthritis   . Atrial fibrillation ([place])   . Carotid artery disease ([place])    Right carotid endarectomy 1999  . Chronic anticoagulation    Followed by PMD  . Coronary atherosclerosis    a. Minor at cardiac catheterization 2005 b. cath 10/16/2014 40% prox LAD dx, otherwise minimal CAD  . Depression   . Essential hypertension   . Glucose intolerance (impaired glucose tolerance)   . History of kidney stones   . Hyperlipidemia   . Pneumonia 2010  . Sick sinus syndrome (H[facility])    Medtronic PPM  . Stroke [facility])    Past Surgical History:  Procedure Laterality Date  . ABDOMINAL HYSTERECTOMY    . APPENDECTOMY    . BREAST BIOPSY Bilateral    x 7 total  . CARDIAC CATHETERIZATION N/A 10/16/2014   Procedure: Left Heart Cath and Coronary Angiography;  Surgeon: Leonie Man, MD;  Location: [facility] CV LAB;  Service: Cardiovascular;  Laterality: N/A;  .  CAROTID ENDARTERECTOMY Right 1999  . CATARACT EXTRACTION W/ INTRAOCULAR LENS  IMPLANT, BILATERAL Bilateral   . COLONOSCOPY     2006  . CYSTOSCOPY W/ URETERAL STENT PLACEMENT Left 04/19/2012   Procedure: CYSTOSCOPY WITH RETROGRADE PYELOGRAM/URETERAL STENT PLACEMENT ;  Surgeon: Ailene Rud, MD;  [facility];  Service: Urology;  Laterality: Left;  . CYSTOSCOPY WITH RETROGRADE PYELOGRAM, URETEROSCOPY AND STENT PLACEMENT Left 06/30/2012   Procedure: CYSTOSCOPY WITH LEFT  RETROGRADE PYELOGRAM, URETEROSCOPY  with basketing of stone,  AND STENT PLACEMENT, TRANSURETHRAL UNROOFING OF URETER.;  Surgeon: Alexis Frock, MD;  [facility];  Service: Urology;  Laterality: Left;  . INSERT / REPLACE / REMOVE PACEMAKER  2006  . OPEN REDUCTION INTERNAL FIXATION (ORIF) DISTAL RADIAL FRACTURE Right 04/29/2016   Procedure: OPEN REDUCTION INTERNAL FIXATION (ORIF) DISTAL RADIAL FRACTURE;  Surgeon: Roseanne Kaufman, MD;  Location: [facility];  Service: Orthopedics;  Laterality: Right;  . ORIF DISTAL RADIUS FRACTURE Right 04/29/2016    Allergies  Allergen Reactions  . Morphine Nausea Only  . Penicillins Other (See Comments)    Has patient had a PCN reaction causing immediate rash, facial/tongue/throat swelling, SOB or lightheadedness with hypotension: NO Has patient had a PCN reaction causing severe rash involving mucus membranes or skin necrosis: no Has patient had a PCN reaction that required hospitalization: NO Has patient had a PCN reaction occurring within the last 10 years: NO If all of the above answers are "NO", then may proceed with Cephalosporin use.     Outpatient Encounter Medications as of 01/14/2018  Medication Sig  . acetaminophen (TYLENOL) 325 MG tablet Take 2 tablets (650 mg total) by mouth every 6 (six) hours as needed for mild pain (or temp > 37.5 C (99.5 F)).  Marland Kitchen diltiazem (TIAZAC) 240 MG 24 hr capsule Take 240 mg by mouth daily.  Marland Kitchen docusate sodium (COLACE) 100 MG capsule Take 100 mg by mouth daily.  . furosemide (LASIX) 40 MG tablet Take 1 tablet (40 mg total) by mouth daily.  Marland Kitchen glipiZIDE (GLUCOTROL) 5 MG tablet Take 2.5 mg by mouth daily before breakfast.  . losartan (COZAAR) 50 MG tablet Take 1 tablet (50 mg total) by mouth 2 (two) times daily. In the morning  . Melatonin 3 MG TABS Take 1 tablet (3 mg total) by mouth at bedtime as needed (SLEEP).  . memantine (NAMENDA) 10 MG tablet Take 10 mg by mouth 2 (two) times daily.  . memantine (NAMENDA) 5 MG tablet Take 5 mg by mouth 2 (two) times daily. Form  01/13/2018-02/13/2018  . metoprolol tartrate (LOPRESSOR) 50 MG tablet Take 50 mg by mouth 2 (two) times daily.  Marland Kitchen omeprazole (PRILOSEC) 20 MG capsule Take 20 mg by mouth daily.  . potassium chloride (K-DUR,KLOR-CON) 10 MEQ tablet Take 20 mEq by mouth daily.  . pravastatin (PRAVACHOL) 20 MG tablet Take 40 mg by mouth daily.   . QUEtiapine (SEROQUEL) 25 MG tablet Take 0.5 tablets (12.5 mg total) by mouth at bedtime.  Marland Kitchen venlafaxine (EFFEXOR) 100 MG tablet Take 100 mg by mouth daily.   . [DISCONTINUED] memantine (NAMENDA) 5 MG tablet Take 1 tablet (5 mg total) by mouth 2 (two) times daily. Start 1 tablet twice daily x 1 month and then increase to 2 tablets twice daily (Patient taking differently: Take 10 mg by mouth 2 (two) times daily. Start 1 tablet twice daily x 1 month and then increase to 2 tablets twice daily)   No facility-administered encounter medications on file as of 01/14/2018.  Review of Systems  Immunization History  Administered Date(s) Administered  . Influenza,inj,Quad PF,6+ Mos 10/14/2014  . Pneumococcal Conjugate-13 04/06/2013   There are no preventive care reminders to display for this patient. Fall Risk  01/12/2018 04/06/2013  Falls in the past year? 0 No   Functional Status Survey:    Vitals:   01/14/18 0951  BP: (!) 155/60  Pulse: 62  Resp: 20  Temp: 97.6 F (36.4 C)  TempSrc: Oral  SpO2: 95%   There is no height or weight on file to calculate BMI. Physical Exam  Labs reviewed: Recent Labs    12/10/17 2336  12/15/17 0520  12/17/17 0700 12/24/17 0539 01/08/18 0700  NA 139   < > 140   < > 140 137 141  K 3.6   < > 3.0*   < > 3.6 4.4 4.0  CL 105   < > 105   < > 104 103 105  CO2 27   < > 27   < > 27 26 28   GLUCOSE 170*   < > 164*   < > 161* 130* 137*  BUN 16   < > 14   < > 14 21 21   CREATININE 0.94   < > 0.98   < > 0.73 0.99 0.97  CALCIUM 8.7*   < > 9.1   < > 9.1 9.6 9.5  MG 1.8  --  1.8  --   --   --   --    < > = values in this interval not  displayed.   Recent Labs    11/24/17 1712 12/10/17 0701  AST 20 21  ALT 16 17  ALKPHOS 100 86  BILITOT 0.6 0.7  PROT 7.4 7.5  ALBUMIN 3.6 3.6   Recent Labs    12/14/17 0454  12/03/2017 0426 12/17/17 0700 01/08/18 0700  WBC 10.9*   < > 13.1* 11.2* 9.8  NEUTROABS 7.4  --   --  7.1 6.0  HGB 11.7*   < > 12.5 12.3 13.6  HCT 36.5   < > 38.6 38.4 42.1  MCV 99.5   < > 97.2 98.2 98.8  PLT 179   < > 234 252 202   < > = values in this interval not displayed.   Lab Results  Component Value Date   TSH 2.115 12/16/2013   Lab Results  Component Value Date   HGBA1C 7.9 (H) 12/12/2017   Lab Results  Component Value Date   CHOL 170 12/12/2017   HDL 33 (L) 12/12/2017   LDLCALC 107 (H) 12/12/2017   TRIG 148 12/12/2017   CHOLHDL 5.2 12/12/2017    Significant Diagnostic Results in last 30 days:  No results found.  Assessment/Plan Acute Bronchitis Will start her on Duo Nebs for few Days. Prednisone 30 mg for 5 Days No Need for Antibiotics right now. Continue to monitor Vitals. Intracerebral Hemorrhage BP controlled Per Neuorolgy Repeated CT scan Ordered. An dpossible Eliquis if Hemorrhage resolved. Diabetes mellitus  Was taken off her Meds in hospital  Accu checks Show morning BS are less then 200 Continue her on Glucotrol 2.5 Mg  Chronic diastolic heart failure  Patient weight stable on Lasix Will repeat Chst Xray if Wheezing not improve. Follow renal Fucntion Essential hypertension metoprolol and Losartan Atrial fibrillation,  Rate control on Metoprolol Not on anticoagulation right now Possibl;e Eliquis after CT scan of Head Dementia with agitation On Effexor and Seroquel Was satrtred on Namends by Neurology Will eventually taper  off her Seroquel Hyperlipidemia On statin LDL was 107 Increased Pravachol to 40 mg   Family/ staff Communication:   Labs/tests ordered:
